# Patient Record
Sex: Female | Born: 1945 | Race: White | Hispanic: No | Marital: Single | Smoking: Former smoker
Health system: Southern US, Community
[De-identification: ages and names within clinical notes are randomized; demographics above are authoritative.]

## PROBLEM LIST (undated history)

## (undated) DIAGNOSIS — E119 Type 2 diabetes mellitus without complications: Secondary | ICD-10-CM

## (undated) DIAGNOSIS — R32 Unspecified urinary incontinence: Secondary | ICD-10-CM

## (undated) DIAGNOSIS — G2401 Drug induced subacute dyskinesia: Secondary | ICD-10-CM

## (undated) DIAGNOSIS — F1911 Other psychoactive substance abuse, in remission: Secondary | ICD-10-CM

## (undated) DIAGNOSIS — F32A Depression, unspecified: Secondary | ICD-10-CM

## (undated) DIAGNOSIS — R7303 Prediabetes: Secondary | ICD-10-CM

## (undated) DIAGNOSIS — I340 Nonrheumatic mitral (valve) insufficiency: Secondary | ICD-10-CM

## (undated) DIAGNOSIS — T7840XA Allergy, unspecified, initial encounter: Secondary | ICD-10-CM

## (undated) DIAGNOSIS — Z8619 Personal history of other infectious and parasitic diseases: Secondary | ICD-10-CM

## (undated) DIAGNOSIS — F1011 Alcohol abuse, in remission: Secondary | ICD-10-CM

## (undated) DIAGNOSIS — N189 Chronic kidney disease, unspecified: Secondary | ICD-10-CM

## (undated) DIAGNOSIS — J45909 Unspecified asthma, uncomplicated: Secondary | ICD-10-CM

## (undated) DIAGNOSIS — R011 Cardiac murmur, unspecified: Secondary | ICD-10-CM

## (undated) DIAGNOSIS — M1990 Unspecified osteoarthritis, unspecified site: Secondary | ICD-10-CM

## (undated) DIAGNOSIS — K635 Polyp of colon: Secondary | ICD-10-CM

## (undated) DIAGNOSIS — Z9989 Dependence on other enabling machines and devices: Secondary | ICD-10-CM

## (undated) DIAGNOSIS — I10 Essential (primary) hypertension: Secondary | ICD-10-CM

## (undated) DIAGNOSIS — J449 Chronic obstructive pulmonary disease, unspecified: Secondary | ICD-10-CM

## (undated) DIAGNOSIS — Z923 Personal history of irradiation: Secondary | ICD-10-CM

## (undated) DIAGNOSIS — E785 Hyperlipidemia, unspecified: Secondary | ICD-10-CM

## (undated) DIAGNOSIS — F319 Bipolar disorder, unspecified: Secondary | ICD-10-CM

## (undated) DIAGNOSIS — J189 Pneumonia, unspecified organism: Secondary | ICD-10-CM

## (undated) DIAGNOSIS — F329 Major depressive disorder, single episode, unspecified: Secondary | ICD-10-CM

## (undated) DIAGNOSIS — K219 Gastro-esophageal reflux disease without esophagitis: Secondary | ICD-10-CM

## (undated) DIAGNOSIS — Z972 Presence of dental prosthetic device (complete) (partial): Secondary | ICD-10-CM

## (undated) HISTORY — DX: Nonrheumatic mitral (valve) insufficiency: I34.0

## (undated) HISTORY — DX: Personal history of other infectious and parasitic diseases: Z86.19

## (undated) HISTORY — PX: SUBTOTAL COLECTOMY: SHX855

## (undated) HISTORY — DX: Unspecified osteoarthritis, unspecified site: M19.90

## (undated) HISTORY — DX: Other psychoactive substance abuse, in remission: F19.11

## (undated) HISTORY — DX: Unspecified urinary incontinence: R32

## (undated) HISTORY — DX: Alcohol abuse, in remission: F10.11

## (undated) HISTORY — DX: Gastro-esophageal reflux disease without esophagitis: K21.9

## (undated) HISTORY — DX: Unspecified asthma, uncomplicated: J45.909

## (undated) HISTORY — DX: Polyp of colon: K63.5

## (undated) HISTORY — PX: APPENDECTOMY: SHX54

## (undated) HISTORY — DX: Depression, unspecified: F32.A

## (undated) HISTORY — PX: BREAST BIOPSY: SHX20

## (undated) HISTORY — PX: CHOLECYSTECTOMY: SHX55

## (undated) HISTORY — DX: Bipolar disorder, unspecified: F31.9

## (undated) HISTORY — DX: Type 2 diabetes mellitus without complications: E11.9

## (undated) HISTORY — DX: Chronic kidney disease, unspecified: N18.9

## (undated) HISTORY — DX: Chronic obstructive pulmonary disease, unspecified: J44.9

## (undated) HISTORY — DX: Essential (primary) hypertension: I10

## (undated) HISTORY — PX: LUNG BIOPSY: SHX232

## (undated) HISTORY — DX: Allergy, unspecified, initial encounter: T78.40XA

## (undated) HISTORY — DX: Hyperlipidemia, unspecified: E78.5

## (undated) HISTORY — DX: Major depressive disorder, single episode, unspecified: F32.9

---

## 2003-06-06 HISTORY — PX: SUBTOTAL COLECTOMY: SHX855

## 2015-06-22 DIAGNOSIS — R7301 Impaired fasting glucose: Secondary | ICD-10-CM | POA: Diagnosis not present

## 2015-07-19 DIAGNOSIS — Z1231 Encounter for screening mammogram for malignant neoplasm of breast: Secondary | ICD-10-CM | POA: Diagnosis not present

## 2015-07-20 DIAGNOSIS — E119 Type 2 diabetes mellitus without complications: Secondary | ICD-10-CM | POA: Diagnosis not present

## 2015-07-27 DIAGNOSIS — H1859 Other hereditary corneal dystrophies: Secondary | ICD-10-CM | POA: Diagnosis not present

## 2015-07-27 DIAGNOSIS — H02834 Dermatochalasis of left upper eyelid: Secondary | ICD-10-CM | POA: Diagnosis not present

## 2015-07-27 DIAGNOSIS — H25811 Combined forms of age-related cataract, right eye: Secondary | ICD-10-CM | POA: Diagnosis not present

## 2015-07-27 DIAGNOSIS — H04123 Dry eye syndrome of bilateral lacrimal glands: Secondary | ICD-10-CM | POA: Diagnosis not present

## 2015-08-16 DIAGNOSIS — E7800 Pure hypercholesterolemia, unspecified: Secondary | ICD-10-CM | POA: Diagnosis not present

## 2015-08-16 DIAGNOSIS — F3162 Bipolar disorder, current episode mixed, moderate: Secondary | ICD-10-CM | POA: Diagnosis not present

## 2015-08-16 DIAGNOSIS — E119 Type 2 diabetes mellitus without complications: Secondary | ICD-10-CM | POA: Diagnosis not present

## 2015-08-17 DIAGNOSIS — E7800 Pure hypercholesterolemia, unspecified: Secondary | ICD-10-CM | POA: Diagnosis not present

## 2015-08-17 DIAGNOSIS — N3941 Urge incontinence: Secondary | ICD-10-CM | POA: Diagnosis not present

## 2015-08-17 DIAGNOSIS — I10 Essential (primary) hypertension: Secondary | ICD-10-CM | POA: Diagnosis not present

## 2015-08-17 DIAGNOSIS — K219 Gastro-esophageal reflux disease without esophagitis: Secondary | ICD-10-CM | POA: Diagnosis not present

## 2015-08-17 DIAGNOSIS — R7309 Other abnormal glucose: Secondary | ICD-10-CM | POA: Diagnosis not present

## 2015-08-17 DIAGNOSIS — J449 Chronic obstructive pulmonary disease, unspecified: Secondary | ICD-10-CM | POA: Diagnosis not present

## 2015-08-17 DIAGNOSIS — R7989 Other specified abnormal findings of blood chemistry: Secondary | ICD-10-CM | POA: Diagnosis not present

## 2015-08-17 DIAGNOSIS — F319 Bipolar disorder, unspecified: Secondary | ICD-10-CM | POA: Diagnosis not present

## 2015-08-18 DIAGNOSIS — E213 Hyperparathyroidism, unspecified: Secondary | ICD-10-CM | POA: Diagnosis not present

## 2015-08-18 DIAGNOSIS — E042 Nontoxic multinodular goiter: Secondary | ICD-10-CM | POA: Diagnosis not present

## 2015-08-18 DIAGNOSIS — E559 Vitamin D deficiency, unspecified: Secondary | ICD-10-CM | POA: Diagnosis not present

## 2015-08-26 DIAGNOSIS — H25811 Combined forms of age-related cataract, right eye: Secondary | ICD-10-CM | POA: Diagnosis not present

## 2015-08-30 DIAGNOSIS — R7989 Other specified abnormal findings of blood chemistry: Secondary | ICD-10-CM | POA: Diagnosis not present

## 2015-08-31 DIAGNOSIS — R7989 Other specified abnormal findings of blood chemistry: Secondary | ICD-10-CM | POA: Diagnosis not present

## 2015-08-31 DIAGNOSIS — N3941 Urge incontinence: Secondary | ICD-10-CM | POA: Diagnosis not present

## 2015-08-31 DIAGNOSIS — R7309 Other abnormal glucose: Secondary | ICD-10-CM | POA: Diagnosis not present

## 2015-08-31 DIAGNOSIS — F17200 Nicotine dependence, unspecified, uncomplicated: Secondary | ICD-10-CM | POA: Diagnosis not present

## 2015-08-31 DIAGNOSIS — K219 Gastro-esophageal reflux disease without esophagitis: Secondary | ICD-10-CM | POA: Diagnosis not present

## 2015-09-01 DIAGNOSIS — M25569 Pain in unspecified knee: Secondary | ICD-10-CM | POA: Diagnosis not present

## 2015-09-01 DIAGNOSIS — R7309 Other abnormal glucose: Secondary | ICD-10-CM | POA: Diagnosis not present

## 2015-09-01 DIAGNOSIS — R7303 Prediabetes: Secondary | ICD-10-CM | POA: Diagnosis not present

## 2015-09-30 DIAGNOSIS — E119 Type 2 diabetes mellitus without complications: Secondary | ICD-10-CM | POA: Diagnosis not present

## 2015-11-09 DIAGNOSIS — F314 Bipolar disorder, current episode depressed, severe, without psychotic features: Secondary | ICD-10-CM | POA: Diagnosis not present

## 2015-11-11 DIAGNOSIS — R7302 Impaired glucose tolerance (oral): Secondary | ICD-10-CM | POA: Diagnosis not present

## 2015-11-22 DIAGNOSIS — E7800 Pure hypercholesterolemia, unspecified: Secondary | ICD-10-CM | POA: Diagnosis not present

## 2015-11-22 DIAGNOSIS — E119 Type 2 diabetes mellitus without complications: Secondary | ICD-10-CM | POA: Diagnosis not present

## 2015-11-22 DIAGNOSIS — I10 Essential (primary) hypertension: Secondary | ICD-10-CM | POA: Diagnosis not present

## 2015-11-23 DIAGNOSIS — E7800 Pure hypercholesterolemia, unspecified: Secondary | ICD-10-CM | POA: Diagnosis not present

## 2015-11-23 DIAGNOSIS — I10 Essential (primary) hypertension: Secondary | ICD-10-CM | POA: Diagnosis not present

## 2015-11-23 DIAGNOSIS — R7309 Other abnormal glucose: Secondary | ICD-10-CM | POA: Diagnosis not present

## 2015-12-27 DIAGNOSIS — E119 Type 2 diabetes mellitus without complications: Secondary | ICD-10-CM | POA: Diagnosis not present

## 2016-02-01 DIAGNOSIS — F3162 Bipolar disorder, current episode mixed, moderate: Secondary | ICD-10-CM | POA: Diagnosis not present

## 2016-02-15 DIAGNOSIS — I10 Essential (primary) hypertension: Secondary | ICD-10-CM | POA: Diagnosis not present

## 2016-02-15 DIAGNOSIS — R21 Rash and other nonspecific skin eruption: Secondary | ICD-10-CM | POA: Diagnosis not present

## 2016-02-15 DIAGNOSIS — E7800 Pure hypercholesterolemia, unspecified: Secondary | ICD-10-CM | POA: Diagnosis not present

## 2016-02-15 DIAGNOSIS — E119 Type 2 diabetes mellitus without complications: Secondary | ICD-10-CM | POA: Diagnosis not present

## 2016-02-15 DIAGNOSIS — R7309 Other abnormal glucose: Secondary | ICD-10-CM | POA: Diagnosis not present

## 2016-02-15 DIAGNOSIS — N3941 Urge incontinence: Secondary | ICD-10-CM | POA: Diagnosis not present

## 2016-02-21 DIAGNOSIS — R7309 Other abnormal glucose: Secondary | ICD-10-CM | POA: Diagnosis not present

## 2016-02-21 DIAGNOSIS — E875 Hyperkalemia: Secondary | ICD-10-CM | POA: Diagnosis not present

## 2016-02-21 DIAGNOSIS — E782 Mixed hyperlipidemia: Secondary | ICD-10-CM | POA: Diagnosis not present

## 2016-02-21 DIAGNOSIS — E119 Type 2 diabetes mellitus without complications: Secondary | ICD-10-CM | POA: Diagnosis not present

## 2016-03-27 DIAGNOSIS — E119 Type 2 diabetes mellitus without complications: Secondary | ICD-10-CM | POA: Diagnosis not present

## 2016-04-04 DIAGNOSIS — K219 Gastro-esophageal reflux disease without esophagitis: Secondary | ICD-10-CM | POA: Diagnosis not present

## 2016-04-04 DIAGNOSIS — Z6832 Body mass index (BMI) 32.0-32.9, adult: Secondary | ICD-10-CM | POA: Diagnosis not present

## 2016-04-04 DIAGNOSIS — J84117 Desquamative interstitial pneumonia: Secondary | ICD-10-CM | POA: Diagnosis not present

## 2016-04-04 DIAGNOSIS — J984 Other disorders of lung: Secondary | ICD-10-CM | POA: Diagnosis not present

## 2016-04-04 DIAGNOSIS — F17211 Nicotine dependence, cigarettes, in remission: Secondary | ICD-10-CM | POA: Diagnosis not present

## 2016-04-04 DIAGNOSIS — E669 Obesity, unspecified: Secondary | ICD-10-CM | POA: Diagnosis not present

## 2016-04-04 DIAGNOSIS — Z23 Encounter for immunization: Secondary | ICD-10-CM | POA: Diagnosis not present

## 2016-04-04 DIAGNOSIS — J449 Chronic obstructive pulmonary disease, unspecified: Secondary | ICD-10-CM | POA: Diagnosis not present

## 2016-04-04 DIAGNOSIS — R918 Other nonspecific abnormal finding of lung field: Secondary | ICD-10-CM | POA: Diagnosis not present

## 2016-05-09 DIAGNOSIS — F3162 Bipolar disorder, current episode mixed, moderate: Secondary | ICD-10-CM | POA: Diagnosis not present

## 2016-05-10 DIAGNOSIS — E119 Type 2 diabetes mellitus without complications: Secondary | ICD-10-CM | POA: Diagnosis not present

## 2016-05-22 DIAGNOSIS — E119 Type 2 diabetes mellitus without complications: Secondary | ICD-10-CM | POA: Diagnosis not present

## 2016-05-22 DIAGNOSIS — E213 Hyperparathyroidism, unspecified: Secondary | ICD-10-CM | POA: Diagnosis not present

## 2016-05-22 DIAGNOSIS — E7800 Pure hypercholesterolemia, unspecified: Secondary | ICD-10-CM | POA: Diagnosis not present

## 2016-05-22 DIAGNOSIS — E559 Vitamin D deficiency, unspecified: Secondary | ICD-10-CM | POA: Diagnosis not present

## 2016-05-22 DIAGNOSIS — E042 Nontoxic multinodular goiter: Secondary | ICD-10-CM | POA: Diagnosis not present

## 2016-05-23 DIAGNOSIS — E7800 Pure hypercholesterolemia, unspecified: Secondary | ICD-10-CM | POA: Diagnosis not present

## 2016-05-23 DIAGNOSIS — I10 Essential (primary) hypertension: Secondary | ICD-10-CM | POA: Diagnosis not present

## 2016-05-23 DIAGNOSIS — E119 Type 2 diabetes mellitus without complications: Secondary | ICD-10-CM | POA: Diagnosis not present

## 2016-06-20 DIAGNOSIS — M79644 Pain in right finger(s): Secondary | ICD-10-CM | POA: Diagnosis not present

## 2016-07-12 DIAGNOSIS — E119 Type 2 diabetes mellitus without complications: Secondary | ICD-10-CM | POA: Diagnosis not present

## 2016-08-01 DIAGNOSIS — F3162 Bipolar disorder, current episode mixed, moderate: Secondary | ICD-10-CM | POA: Diagnosis not present

## 2016-11-09 ENCOUNTER — Ambulatory Visit (INDEPENDENT_AMBULATORY_CARE_PROVIDER_SITE_OTHER): Payer: Medicare Other | Admitting: Family

## 2016-11-09 ENCOUNTER — Encounter: Payer: Self-pay | Admitting: Family

## 2016-11-09 VITALS — BP 118/64 | HR 86 | Temp 98.4°F | Resp 14 | Ht 64.0 in | Wt 188.0 lb

## 2016-11-09 DIAGNOSIS — F3181 Bipolar II disorder: Secondary | ICD-10-CM | POA: Diagnosis not present

## 2016-11-09 DIAGNOSIS — I10 Essential (primary) hypertension: Secondary | ICD-10-CM | POA: Diagnosis not present

## 2016-11-09 DIAGNOSIS — E119 Type 2 diabetes mellitus without complications: Secondary | ICD-10-CM | POA: Diagnosis not present

## 2016-11-09 DIAGNOSIS — N3281 Overactive bladder: Secondary | ICD-10-CM | POA: Insufficient documentation

## 2016-11-09 DIAGNOSIS — E782 Mixed hyperlipidemia: Secondary | ICD-10-CM

## 2016-11-09 DIAGNOSIS — J449 Chronic obstructive pulmonary disease, unspecified: Secondary | ICD-10-CM

## 2016-11-09 MED ORDER — PAROXETINE HCL 40 MG PO TABS: 60.0000 mg | ORAL_TABLET | ORAL | 0 refills | Status: DC

## 2016-11-09 MED ORDER — EZETIMIBE 10 MG PO TABS: 10.0000 mg | ORAL_TABLET | Freq: Every day | ORAL | 0 refills | Status: DC

## 2016-11-09 MED ORDER — PRAVASTATIN SODIUM 10 MG PO TABS: 10.0000 mg | ORAL_TABLET | ORAL | 0 refills | Status: DC

## 2016-11-09 MED ORDER — UMECLIDINIUM BROMIDE 62.5 MCG/INH IN AEPB: 1.0000 | INHALATION_SPRAY | Freq: Every day | RESPIRATORY_TRACT | 0 refills | Status: DC

## 2016-11-09 MED ORDER — TOLTERODINE TARTRATE ER 4 MG PO CP24: 4.0000 mg | ORAL_CAPSULE | Freq: Every day | ORAL | 0 refills | Status: DC

## 2016-11-09 MED ORDER — METFORMIN HCL ER 500 MG PO TB24: 500.0000 mg | ORAL_TABLET | Freq: Every day | ORAL | 0 refills | Status: DC

## 2016-11-09 NOTE — Assessment & Plan Note (Signed)
Blood pressure appears adequately controlled current medication regimen and no adverse side effects. Continue current dosage of amlodipine. Encouraged to monitor blood pressure at home and follow sodium diet. Denies worst headache of life with no new symptoms of end organ damage noted in physical exam. Continue to monitor.

## 2016-11-09 NOTE — Assessment & Plan Note (Signed)
Bipolar and depression appear adequately controlled with current medication regimen and no adverse side effects or suicidal ideations. Continue current dosage of olanzapine, Ambien, and paroxetine. Follow-up and changes per psychiatry.

## 2016-11-09 NOTE — Assessment & Plan Note (Signed)
Type 2 diabetes currently maintained on metformin appears adequately controlled with most recent A1c of 6.4. Diabetic foot exam completed today. Patient indicates she is completed Pneumovax. We'll await documentation. Maintained on pravastatin for CAD risk reduction. Continue current dosage of metformin and monitor blood sugars at home 1-2 times per week.

## 2016-11-09 NOTE — Progress Notes (Signed)
Subjective:    Patient ID: Paula [name], female    DOB: Apr 18, 1946, 71 y.o.   MRN: [id number]  Chief Complaint  Patient presents with  . Establish Care    medication refills    HPI:  Paula [name] is a 71 y.o. female who  has a past medical history of Allergy; Arthritis; Asthma; Bipolar 1 disorder ([place]); Colon polyps; Depression; Diabetes mellitus without complication ([place]); GERD (gastroesophageal reflux disease); History of alcohol abuse; History of chicken pox; History of drug abuse; Hyperlipidemia; Hypertension; and Urine incontinence. and presents today for an office visit to establish care.   1.) Hypertension - currently maintained on amlodipine. Reports taking the medications as prescribed and denies adverse side effects or hypotensive readings. Blood pressures at home . Denies changes in vision, worst headache of life or new symptoms of end organ damage. Working on following a low sodium diet.   BP Readings from Last 3 Encounters:  11/09/16 118/64    2.) Diabetes - Currently maintained on metformin. Reports taking the medication as prescribed and denies adverse side effects or hypoglycemic readings. Blood sugars at home have been well controlled. Denies new symptoms of end organ damage. No excessive hunger, thirst, or urination. Working on a low/carbohydrate modified oral intake. Most recent A1c from outside blood work was 6.4.   No results found for: CREATININE, BUN, NA, K, CL, CO2   3.) Bipolar / Depression  -Currently maintained on olanzapine, paroxetine, and Ambien. Reports taking the medications as prescribed and denies adverse side effects or suicidal ideations. Describes her mood to be stable currently. She is scheduled to see psychiatry to assume care for her depression/bipolar symptoms.  4.) Urinary incontinence - currently maintained on Detrol. Reports taking the medication as prescribed and denies adverse side effects. Symptoms are adequately controlled with some  relief for improvement and she still does experience urinary frequency. Denies additional urinary symptoms including dysuria, hematuria, or flank pain.  5.) Hyperlipidemia - previously diagnosed with hyperlipidemia and currently maintained on ezetimibe and pravastatin. She takes the pravastatin 3 days per week secondary to muscle pains she takes a greater than that. Denies chest pain, shortness of breath, or heart palpitations.  6.) COPD - l previously diagnosed with COPD and currently maintained on Incuse. She has a 82-1/2 pack year history. She is no longer currently smoking. Denies shortness of breath or wheezing. Symptoms are adequately controlled current medication regimen.     Allergies  Allergen Reactions  . Penicillins       No outpatient prescriptions prior to visit.   No facility-administered medications prior to visit.      Past Medical History:  Diagnosis Date  . Allergy   . Arthritis   . Asthma   . Bipolar 1 disorder ([place])   . Colon polyps   . Depression   . Diabetes mellitus without complication ([place])   . GERD (gastroesophageal reflux disease)   . History of alcohol abuse   . History of chicken pox   . History of drug abuse   . Hyperlipidemia   . Hypertension   . Urine incontinence       Past Surgical History:  Procedure Laterality Date  . APPENDECTOMY    . BREAST BIOPSY    . CHOLECYSTECTOMY    . LUNG BIOPSY    . SUBTOTAL COLECTOMY        Family History  Problem Relation Age of Onset  . Hyperlipidemia Mother   . Arthritis Father   . Heart disease  Father   . Diabetes Father   . Heart disease Paternal Uncle   . Heart disease Paternal Grandmother       Social History   Social History  . Marital status: Single    Spouse name: N/A  . Number of children: 1  . Years of education: 38   Occupational History  . Retired Therapist, sports    Social History Main Topics  . Smoking status: Former Smoker    Packs/day: 1.50    Years: 55.00    Types:  Cigarettes  . Smokeless tobacco: Never Used  . Alcohol use 2.4 - 3.6 oz/week    4 - 6 Cans of beer per week  . Drug use: Yes    Frequency: 7.0 times per week    Types: Marijuana  . Sexual activity: Not on file   Other Topics Concern  . Not on file   Social History Narrative   Fun/Hobby: Read   Denies abuse and feels safe at home       Review of Systems  Constitutional: Negative for chills, fatigue and fever.  Eyes:       Negative for changes in vision  Respiratory: Negative for cough, chest tightness, shortness of breath and wheezing.   Cardiovascular: Negative for chest pain, palpitations and leg swelling.  Endocrine: Negative for polydipsia, polyphagia and polyuria.  Genitourinary: Positive for frequency. Negative for dysuria, hematuria and urgency.  Neurological: Negative for dizziness, weakness, light-headedness and numbness.       Objective:    BP 118/64 (BP Location: Left Arm, Patient Position: Sitting, Cuff Size: Large)   Pulse 86   Temp 98.4 F (36.9 C) (Oral)   Resp 14   Ht 5\' 4"  (1.626 m)   Wt 188 lb (85.3 kg)   SpO2 92%   BMI 32.27 kg/m  Nursing note and vital signs reviewed.  Physical Exam  Constitutional: She is oriented to person, place, and time. She appears well-developed and well-nourished. No distress.  Cardiovascular: Normal rate, regular rhythm and intact distal pulses.  Exam reveals no gallop and no friction rub.   No murmur heard. Pulmonary/Chest: Effort normal and breath sounds normal. No respiratory distress. She has no wheezes. She has no rales. She exhibits no tenderness.  Lymphadenopathy:    She has no cervical adenopathy.  Neurological: She is alert and oriented to person, place, and time.  Diabetic foot exam - bilateral feet are free from skin breakdown, cuts, and abrasions. Toenails are neatly trimmed. Pulses are intact and appropriate. Sensation is intact to monofilament bilaterally.  Skin: Skin is warm and dry.  Psychiatric: She  has a normal mood and affect. Her behavior is normal. Judgment and thought content normal.        Assessment & Plan:   Problem List Items Addressed This Visit      Cardiovascular and Mediastinum   Essential hypertension    Blood pressure appears adequately controlled current medication regimen and no adverse side effects. Continue current dosage of amlodipine. Encouraged to monitor blood pressure at home and follow sodium diet. Denies worst headache of life with no new symptoms of end organ damage noted in physical exam. Continue to monitor.      Relevant Medications   amLODipine (NORVASC) 2.5 MG tablet   aspirin EC 81 MG tablet   ezetimibe (ZETIA) 10 MG tablet   pravastatin (PRAVACHOL) 10 MG tablet (Start on 11/10/2016)     Respiratory   COPD (chronic obstructive pulmonary disease) ([place])    Appears  he diagnosed with COPD and maintained on Incuse. No current symptoms of exacerbation. Consider pulmonary function tests in the near future. Continue current dosage of Incruse.      Relevant Medications   umeclidinium bromide (INCRUSE ELLIPTA) 62.5 MCG/INH AEPB     Endocrine   Type 2 diabetes mellitus, controlled ([place]) - Primary    Type 2 diabetes currently maintained on metformin appears adequately controlled with most recent A1c of 6.4. Diabetic foot exam completed today. Patient indicates she is completed Pneumovax. We'll await documentation. Maintained on pravastatin for CAD risk reduction. Continue current dosage of metformin and monitor blood sugars at home 1-2 times per week.      Relevant Medications   aspirin EC 81 MG tablet   metFORMIN (GLUCOPHAGE-XR) 500 MG 24 hr tablet   pravastatin (PRAVACHOL) 10 MG tablet (Start on 11/10/2016)     Genitourinary   Overactive bladder    Continues to experience symptoms of overactive bladder with increased urinary frequency. Increase Detrol LA to 4 mg daily. Encouraged to avoid caffeine. Follow-up in one month or sooner if needed.         Other   Mixed hyperlipidemia    Mixed hyperlipidemia appears poorly controlled with current medication regimen based on previous lab results. Recheck lipid profile in one month. Continue current dosage of pravastatin and Zetia. Encouraged to follow low fat diet to reduce saturated fats and processed/sugary food intake.      Relevant Medications   amLODipine (NORVASC) 2.5 MG tablet   aspirin EC 81 MG tablet   ezetimibe (ZETIA) 10 MG tablet   pravastatin (PRAVACHOL) 10 MG tablet (Start on 11/10/2016)   Bipolar 2 disorder (HCC)    Bipolar and depression appear adequately controlled with current medication regimen and no adverse side effects or suicidal ideations. Continue current dosage of olanzapine, Ambien, and paroxetine. Follow-up and changes per psychiatry.          I have discontinued Ms. Kuenzel's paroxetine, metFORMIN, and tolterodine. I have also changed her ezetimibe, metFORMIN, PARoxetine, and pravastatin. Additionally, I am having her start on tolterodine. Lastly, I am having her maintain her multivitamin-iron-minerals-folic acid, Vitamin D3, ranitidine, amLODipine, aspirin EC, zolpidem, OLANZapine, and umeclidinium bromide.   Meds ordered this encounter  Medications  . multivitamin-iron-minerals-folic acid (CENTRUM) chewable tablet    Sig: Chew 1 tablet by mouth daily.  . Cholecalciferol (VITAMIN D3) 2000 units TABS    Sig: Take by mouth.  . ranitidine (ZANTAC) 300 MG tablet    Sig: Take 300 mg by mouth at bedtime.  Marland Kitchen DISCONTD: pravastatin (PRAVACHOL) 10 MG tablet    Sig: Take 10 mg by mouth 3 (three) times a week.   Marland Kitchen amLODipine (NORVASC) 2.5 MG tablet    Sig: Take 2.5 mg by mouth daily.  Marland Kitchen aspirin EC 81 MG tablet    Sig: Take 81 mg by mouth daily.  Marland Kitchen zolpidem (AMBIEN) 10 MG tablet    Sig: Take 10 mg by mouth at bedtime as needed for sleep.  Marland Kitchen DISCONTD: paroxetine (PAXIL) 10 MG/5ML suspension    Sig: Take 60 mg by mouth every morning.  Marland Kitchen OLANZapine (ZYPREXA) 15 MG tablet     Sig: Take 15 mg by mouth at bedtime.  Marland Kitchen DISCONTD: metFORMIN (GLUCOPHAGE) 500 MG tablet    Sig: Take by mouth 2 (two) times daily with a meal.  . DISCONTD: ezetimibe (ZETIA) 10 MG tablet    Sig: Take 10 mg by mouth daily.  Marland Kitchen DISCONTD: umeclidinium bromide (INCRUSE ELLIPTA)  62.5 MCG/INH AEPB    Sig: Inhale 1 puff into the lungs daily.  Marland Kitchen DISCONTD: tolterodine (DETROL) 2 MG tablet    Sig: Take 2 mg by mouth 2 (two) times daily.  Marland Kitchen DISCONTD: PARoxetine (PAXIL) 40 MG tablet    Sig: Take 60 mg by mouth every morning.  Marland Kitchen DISCONTD: metFORMIN (GLUCOPHAGE-XR) 500 MG 24 hr tablet    Sig: Take 500 mg by mouth daily with breakfast.  . ezetimibe (ZETIA) 10 MG tablet    Sig: Take 1 tablet (10 mg total) by mouth daily.    Dispense:  90 tablet    Refill:  0    Order Specific Question:   Supervising Provider    Answer:   Pricilla Holm A [[id number]]  . metFORMIN (GLUCOPHAGE-XR) 500 MG 24 hr tablet    Sig: Take 1 tablet (500 mg total) by mouth daily with breakfast.    Dispense:  90 tablet    Refill:  0    Order Specific Question:   Supervising Provider    Answer:   Pricilla Holm A [[id number]]  . PARoxetine (PAXIL) 40 MG tablet    Sig: Take 1.5 tablets (60 mg total) by mouth every morning.    Dispense:  135 tablet    Refill:  0    Order Specific Question:   Supervising Provider    Answer:   Pricilla Holm A [[id number]]  . pravastatin (PRAVACHOL) 10 MG tablet    Sig: Take 1 tablet (10 mg total) by mouth 3 (three) times a week.    Dispense:  36 tablet    Refill:  0    Order Specific Question:   Supervising Provider    Answer:   Pricilla Holm A [[id number]]  . umeclidinium bromide (INCRUSE ELLIPTA) 62.5 MCG/INH AEPB    Sig: Inhale 1 puff into the lungs daily.    Dispense:  90 each    Refill:  0    Order Specific Question:   Supervising Provider    Answer:   Pricilla Holm A [[id number]]  . tolterodine (DETROL LA) 4 MG 24 hr capsule    Sig: Take 1 capsule (4 mg total) by mouth daily.     Dispense:  90 capsule    Refill:  0    Order Specific Question:   Supervising Provider    Answer:   Pricilla Holm A [[id number]]     Follow-up: No Follow-up on file.  Mauricio Po, FNP

## 2016-11-09 NOTE — Assessment & Plan Note (Signed)
Mixed hyperlipidemia appears poorly controlled with current medication regimen based on previous lab results. Recheck lipid profile in one month. Continue current dosage of pravastatin and Zetia. Encouraged to follow low fat diet to reduce saturated fats and processed/sugary food intake.

## 2016-11-09 NOTE — Patient Instructions (Signed)
Thank you for choosing [facility].  SUMMARY AND INSTRUCTIONS:  Please continue to take your medication as prescribed.   Schedule a time for your physical/annual wellness at your convenience.   Plan to follow up in 3 months for blood work or at physical.  Medication:  Your prescription(s) have been submitted to your pharmacy or been printed and provided for you. Please take as directed and contact our office if you believe you are having problem(s) with the medication(s) or have any questions.  Follow up:  If your symptoms worsen or fail to improve, please contact our office for further instruction, or in case of emergency go directly to the emergency room at the closest medical facility.

## 2016-11-09 NOTE — Assessment & Plan Note (Signed)
Continues to experience symptoms of overactive bladder with increased urinary frequency. Increase Detrol LA to 4 mg daily. Encouraged to avoid caffeine. Follow-up in one month or sooner if needed.

## 2016-11-09 NOTE — Assessment & Plan Note (Signed)
Appears he diagnosed with COPD and maintained on Incuse. No current symptoms of exacerbation. Consider pulmonary function tests in the near future. Continue current dosage of Incruse.

## 2016-12-26 DIAGNOSIS — F3131 Bipolar disorder, current episode depressed, mild: Secondary | ICD-10-CM | POA: Diagnosis not present

## 2017-01-26 ENCOUNTER — Other Ambulatory Visit: Payer: Self-pay | Admitting: Family

## 2017-02-06 ENCOUNTER — Other Ambulatory Visit: Payer: Self-pay | Admitting: Family

## 2017-02-06 DIAGNOSIS — Z23 Encounter for immunization: Secondary | ICD-10-CM | POA: Diagnosis not present

## 2017-02-09 ENCOUNTER — Ambulatory Visit: Payer: Medicare Other | Admitting: Family

## 2017-02-09 NOTE — Progress Notes (Signed)
Subjective:   Paula [name] is a 71 y.o. female who presents for an Initial Medicare Annual Wellness Visit.  Review of Systems    No ROS.  Medicare Wellness Visit. Additional risk factors are reflected in the social history.  Cardiac Risk Factors include: advanced age (>65men, >52 women);diabetes mellitus;dyslipidemia;sedentary lifestyle;obesity (BMI >30kg/m2);hypertension Sleep patterns: feels rested on waking, gets up 1-2 times nightly to void and sleeps 5-6 hours nightly.  Patient reports insomnia issues, discussed recommended sleep tips and stress reduction tips, education was attached to patient's AVS.  Home Safety/Smoke Alarms: Feels safe in home. Smoke alarms in place.  Living environment; residence and Firearm Safety: 1-story house/ trailer, no firearms, Lives alone, no needs for DME, good support system. Seat Belt Safety/Bike Helmet: Wears seat belt.     Objective:    Today's Vitals   02/12/17 1022  BP: 120/68  Pulse: 74  Resp: 16  Temp: 98.3 F (36.8 C)  TempSrc: Oral  SpO2: 95%  Weight: 188 lb (85.3 kg)  Height: 5\' 4"  (1.626 m)   Body mass index is 32.27 kg/m.   Current Medications (verified) Outpatient Encounter Prescriptions as of 02/12/2017  Medication Sig  . amLODipine (NORVASC) 2.5 MG tablet Take 2.5 mg by mouth daily.  Marland Kitchen aspirin EC 81 MG tablet Take 81 mg by mouth daily.  . Cholecalciferol (VITAMIN D3) 2000 units TABS Take by mouth.  . ezetimibe (ZETIA) 10 MG tablet TAKE 1 TABLET BY MOUTH EVERY DAY  . metFORMIN (GLUCOPHAGE-XR) 500 MG 24 hr tablet Take 1 tablet (500 mg total) by mouth daily with breakfast.  . multivitamin-iron-minerals-folic acid (CENTRUM) chewable tablet Chew 1 tablet by mouth daily.  Marland Kitchen OLANZapine (ZYPREXA) 15 MG tablet Take 15 mg by mouth at bedtime.  Marland Kitchen PARoxetine (PAXIL) 40 MG tablet TAKE 1.5 TABLETS (60 MG TOTAL) BY MOUTH EVERY MORNING.  . pravastatin (PRAVACHOL) 10 MG tablet Take 1 tablet (10 mg total) by mouth 3 (three) times a  week. Needs office visit for more refills.  . ranitidine (ZANTAC) 300 MG tablet Take 300 mg by mouth at bedtime.  . tolterodine (DETROL LA) 4 MG 24 hr capsule TAKE 1 CAPSULE (4 MG TOTAL) BY MOUTH DAILY.  Marland Kitchen umeclidinium bromide (INCRUSE ELLIPTA) 62.5 MCG/INH AEPB Inhale 1 puff into the lungs daily.  Marland Kitchen zolpidem (AMBIEN) 10 MG tablet Take 10 mg by mouth at bedtime as needed for sleep.   No facility-administered encounter medications on file as of 02/12/2017.     Allergies (verified) Penicillins   History: Past Medical History:  Diagnosis Date  . Allergy   . Arthritis   . Asthma   . Bipolar 1 disorder ([place])   . Colon polyps   . Depression   . Diabetes mellitus without complication ([place])   . GERD (gastroesophageal reflux disease)   . History of alcohol abuse   . History of chicken pox   . History of drug abuse   . Hyperlipidemia   . Hypertension   . Urine incontinence    Past Surgical History:  Procedure Laterality Date  . APPENDECTOMY    . BREAST BIOPSY    . CHOLECYSTECTOMY    . LUNG BIOPSY    . SUBTOTAL COLECTOMY     Family History  Problem Relation Age of Onset  . Hyperlipidemia Mother   . Arthritis Father   . Heart disease Father   . Diabetes Father   . Heart disease Paternal Uncle   . Heart disease Paternal Grandmother    Social  History   Occupational History  . Retired Therapist, sports    Social History Main Topics  . Smoking status: Former Smoker    Packs/day: 1.50    Years: 55.00    Types: Cigarettes  . Smokeless tobacco: Never Used  . Alcohol use 2.4 - 3.6 oz/week    4 - 6 Cans of beer per week  . Drug use: Yes    Frequency: 7.0 times per week    Types: Marijuana  . Sexual activity: Not on file    Tobacco Counseling Counseling given: Not Answered   Activities of Daily Living In your present state of health, do you have any difficulty performing the following activities: 02/12/2017  Hearing? N  Vision? Y  Difficulty concentrating or making decisions? N    Walking or climbing stairs? N  Dressing or bathing? N  Doing errands, shopping? N  Preparing Food and eating ? N  Using the Toilet? N  In the past six months, have you accidently leaked urine? N  Do you have problems with loss of bowel control? N  Managing your Medications? N  Managing your Finances? N  Housekeeping or managing your Housekeeping? N    Immunizations and Health Maintenance Immunization History  Administered Date(s) Administered  . Influenza-Unspecified 02/03/2017  . Pneumococcal Conjugate-13 02/12/2017   Health Maintenance Due  Topic Date Due  . HEMOGLOBIN A1C  03/10/1946  . Hepatitis C Screening  09-01-1945  . OPHTHALMOLOGY EXAM  05/05/1955  . URINE MICROALBUMIN  05/05/1955  . TETANUS/TDAP  05/04/1964  . MAMMOGRAM  05/05/1995  . COLONOSCOPY  05/05/1995  . DEXA SCAN  05/04/2010  . PNA vac Low Risk Adult (1 of 2 - PCV13) 05/04/2010    Patient Care Team: [place], FNP as PCP - General (Family Medicine)  Indicate any recent Medical Services you may have received from other than [facility] providers in the past year (date may be approximate).     Assessment:   This is a routine wellness examination for [place]. Physical assessment deferred to PCP.   Hearing/Vision screen  Visual Acuity Screening   Right eye Left eye Both eyes  Without correction: 20/63 2063 20/63  With correction:     Comments: Eye referral placed today.  Hearing Screening Comments: Able to hear conversational tones w/o difficulty. No issues reported.  Passed whisper test  Dietary issues and exercise activities discussed: Current Exercise Habits: The patient does not participate in regular exercise at present, Exercise limited by: None identified  Reviewed heart healthy and diabetic diet, encouraged patient to increase daily water intake.   Goals    . Go back to  the gym          I will visit the gym close to my house and go walk in the park.      Depression Screen PHQ  2/9 Scores 02/12/2017  PHQ - 2 Score 3  PHQ- 9 Score 5    Fall Risk Fall Risk  02/12/2017  Falls in the past year? Yes  Number falls in past yr: 2 or more  Follow up Falls prevention discussed;Education provided    Cognitive Function: MMSE - Mini Mental State Exam 02/12/2017  Orientation to time 5  Orientation to Place 5  Registration 3  Attention/ Calculation 5  Recall 2  Language- name 2 objects 2  Language- repeat 1  Language- follow 3 step command 3  Language- read & follow direction 1  Write a sentence 1  Copy design 1  Total  score 29        Screening Tests Health Maintenance  Topic Date Due  . HEMOGLOBIN A1C  15-Jan-1946  . Hepatitis C Screening  1946-05-12  . OPHTHALMOLOGY EXAM  05/05/1955  . URINE MICROALBUMIN  05/05/1955  . TETANUS/TDAP  05/04/1964  . MAMMOGRAM  05/05/1995  . COLONOSCOPY  05/05/1995  . DEXA SCAN  05/04/2010  . PNA vac Low Risk Adult (1 of 2 - PCV13) 05/04/2010  . FOOT EXAM  11/09/2017  . INFLUENZA VACCINE  Completed      Plan:    Continue to eat heart healthy diet (full of fruits, vegetables, whole grains, lean protein, water--limit salt, fat, and sugar intake) and increase physical activity as tolerated.  Continue doing brain stimulating activities (puzzles, reading, adult coloring books, staying active) to keep memory sharp.   I have personally reviewed and noted the following in the patient's chart:   . Medical and social history . Use of alcohol, tobacco or illicit drugs  . Current medications and supplements . Functional ability and status . Nutritional status . Physical activity . Advanced directives . List of other physicians . Vitals . Screenings to include cognitive, depression, and falls . Referrals and appointments  In addition, I have reviewed and discussed with patient certain preventive protocols, quality metrics, and best practice recommendations. A written personalized care plan for preventive services as well as  general preventive health recommendations were provided to patient.     Michiel Cowboy, RN   02/12/2017    Medical screening examination/treatment/procedure(s) were performed by non-physician practitioner and as supervising provider I was immediately available for consultation/collaboration. I agree with treatment plan. Mauricio Po, FNP

## 2017-02-09 NOTE — Progress Notes (Signed)
Pre visit review using our clinic review tool, if applicable. No additional management support is needed unless otherwise documented below in the visit note. 

## 2017-02-12 ENCOUNTER — Other Ambulatory Visit (INDEPENDENT_AMBULATORY_CARE_PROVIDER_SITE_OTHER): Payer: Medicare Other

## 2017-02-12 ENCOUNTER — Ambulatory Visit (INDEPENDENT_AMBULATORY_CARE_PROVIDER_SITE_OTHER): Payer: Medicare Other | Admitting: Family

## 2017-02-12 ENCOUNTER — Encounter: Payer: Self-pay | Admitting: Family

## 2017-02-12 VITALS — BP 120/68 | HR 74 | Temp 98.3°F | Resp 16 | Ht 64.0 in | Wt 188.0 lb

## 2017-02-12 DIAGNOSIS — E782 Mixed hyperlipidemia: Secondary | ICD-10-CM

## 2017-02-12 DIAGNOSIS — Z0000 Encounter for general adult medical examination without abnormal findings: Secondary | ICD-10-CM | POA: Diagnosis not present

## 2017-02-12 DIAGNOSIS — Z23 Encounter for immunization: Secondary | ICD-10-CM

## 2017-02-12 DIAGNOSIS — E119 Type 2 diabetes mellitus without complications: Secondary | ICD-10-CM

## 2017-02-12 DIAGNOSIS — E2839 Other primary ovarian failure: Secondary | ICD-10-CM

## 2017-02-12 DIAGNOSIS — Z1231 Encounter for screening mammogram for malignant neoplasm of breast: Secondary | ICD-10-CM | POA: Diagnosis not present

## 2017-02-12 LAB — LIPID PANEL
CHOL/HDL RATIO: 4
CHOLESTEROL: 218 mg/dL — AB (ref 0–200)
HDL: 50.8 mg/dL (ref >39.00)
NonHDL: 167.65
TRIGLYCERIDES: 261 mg/dL — AB (ref 0.0–149.0)
VLDL: 52.2 mg/dL — ABNORMAL HIGH (ref 0.0–40.0)

## 2017-02-12 LAB — COMPREHENSIVE METABOLIC PANEL
ALK PHOS: 90 U/L (ref 39–117)
ALT: 14 U/L (ref 0–35)
AST: 15 U/L (ref 0–37)
Albumin: 4.2 g/dL (ref 3.5–5.2)
BILIRUBIN TOTAL: 0.4 mg/dL (ref 0.2–1.2)
BUN: 16 mg/dL (ref 6–23)
CALCIUM: 10.2 mg/dL (ref 8.4–10.5)
CO2: 28 mEq/L (ref 19–32)
Chloride: 102 mEq/L (ref 96–112)
Creatinine, Ser: 1.15 mg/dL (ref 0.40–1.20)
GFR: 49.33 mL/min — AB (ref >60.00)
Glucose, Bld: 110 mg/dL — ABNORMAL HIGH (ref 70–99)
POTASSIUM: 4.5 meq/L (ref 3.5–5.1)
Sodium: 141 mEq/L (ref 135–145)
TOTAL PROTEIN: 7.4 g/dL (ref 6.0–8.3)

## 2017-02-12 LAB — CBC
HCT: 41.3 % (ref 36.0–46.0)
Hemoglobin: 13.7 g/dL (ref 12.0–15.0)
MCHC: 33.2 g/dL (ref 30.0–36.0)
MCV: 99.9 fl (ref 78.0–100.0)
PLATELETS: 224 10*3/uL (ref 150.0–400.0)
RBC: 4.13 Mil/uL (ref 3.87–5.11)
RDW: 11.9 % (ref 11.5–15.5)
WBC: 7.9 10*3/uL (ref 4.0–10.5)

## 2017-02-12 LAB — MICROALBUMIN / CREATININE URINE RATIO
CREATININE, U: 44.8 mg/dL
MICROALB/CREAT RATIO: 1.6 mg/g (ref 0.0–30.0)

## 2017-02-12 LAB — HEMOGLOBIN A1C: Hgb A1c MFr Bld: 6.1 % (ref 4.6–6.5)

## 2017-02-12 LAB — LDL CHOLESTEROL, DIRECT: Direct LDL: 115 mg/dL

## 2017-02-12 NOTE — Progress Notes (Signed)
Subjective:    Patient ID: Paula [name], female    DOB: November 13, 1945, 71 y.o.   MRN: [id number]  Chief Complaint  Patient presents with  . Follow-up    diabetes  . Medicare Wellness    HPI:  Paula [name] is a 71 y.o. female who  has a past medical history of Allergy; Arthritis; Asthma; Bipolar 1 disorder ([place]); Colon polyps; Depression; Diabetes mellitus without complication ([place]); GERD (gastroesophageal reflux disease); History of alcohol abuse; History of chicken pox; History of drug abuse; Hyperlipidemia; Hypertension; and Urine incontinence. and presents today for a follow up office visit.   Diabetes - Currently prescribed metformin. Reports taking the medication as prescribed and denies adverse side effects or hypoglycemic readings. Blood sugars at home have been averaging below 130. Denies new symptoms of end organ damage. No excessive hunger, thirst, or urination. Working on a low/carbohydrate modified oral intake  No results found for: HGBA1C   No results found for: CREATININE, BUN, NA, K, CL, CO2      Allergies  Allergen Reactions  . Penicillins       Outpatient Medications Prior to Visit  Medication Sig Dispense Refill  . amLODipine (NORVASC) 2.5 MG tablet Take 2.5 mg by mouth daily.    Marland Kitchen aspirin EC 81 MG tablet Take 81 mg by mouth daily.    . Cholecalciferol (VITAMIN D3) 2000 units TABS Take by mouth.    . ezetimibe (ZETIA) 10 MG tablet TAKE 1 TABLET BY MOUTH EVERY DAY 90 tablet 0  . metFORMIN (GLUCOPHAGE-XR) 500 MG 24 hr tablet Take 1 tablet (500 mg total) by mouth daily with breakfast. 90 tablet 0  . multivitamin-iron-minerals-folic acid (CENTRUM) chewable tablet Chew 1 tablet by mouth daily.    Marland Kitchen OLANZapine (ZYPREXA) 15 MG tablet Take 15 mg by mouth at bedtime.    Marland Kitchen PARoxetine (PAXIL) 40 MG tablet TAKE 1.5 TABLETS (60 MG TOTAL) BY MOUTH EVERY MORNING. 135 tablet 0  . pravastatin (PRAVACHOL) 10 MG tablet Take 1 tablet (10 mg total) by mouth 3 (three) times a  week. Needs office visit for more refills. 36 tablet 0  . ranitidine (ZANTAC) 300 MG tablet Take 300 mg by mouth at bedtime.    . tolterodine (DETROL LA) 4 MG 24 hr capsule TAKE 1 CAPSULE (4 MG TOTAL) BY MOUTH DAILY. 90 capsule 0  . umeclidinium bromide (INCRUSE ELLIPTA) 62.5 MCG/INH AEPB Inhale 1 puff into the lungs daily. 90 each 0  . zolpidem (AMBIEN) 10 MG tablet Take 10 mg by mouth at bedtime as needed for sleep.     No facility-administered medications prior to visit.     Past Medical History:  Diagnosis Date  . Allergy   . Arthritis   . Asthma   . Bipolar 1 disorder ([place])   . Colon polyps   . Depression   . Diabetes mellitus without complication ([place])   . GERD (gastroesophageal reflux disease)   . History of alcohol abuse   . History of chicken pox   . History of drug abuse   . Hyperlipidemia   . Hypertension   . Urine incontinence     Review of Systems  Constitutional: Negative for chills, fatigue and fever.  Eyes:       Denies changes in vision  Respiratory: Negative for chest tightness and shortness of breath.   Cardiovascular: Negative for chest pain, palpitations and leg swelling.  Endocrine: Negative for polydipsia, polyphagia and polyuria.  Neurological: Negative for numbness.  Objective:    BP 120/68 (BP Location: Left Arm, Patient Position: Sitting, Cuff Size: Large)   Pulse 74   Temp 98.3 F (36.8 C) (Oral)   Resp 16   Ht 5' 4" (1.626 m)   Wt 188 lb (85.3 kg)   SpO2 95%   BMI 32.27 kg/m  Nursing note and vital signs reviewed.  Physical Exam  Constitutional: She is oriented to person, place, and time. She appears well-developed and well-nourished. No distress.  Cardiovascular: Normal rate, regular rhythm, normal heart sounds and intact distal pulses.   Pulmonary/Chest: Effort normal and breath sounds normal.  Musculoskeletal:  Diabetic Foot Exam - Simple   Simple Foot Form Diabetic Foot exam was performed with the following findings:  Yes    02/12/2017 10:30 AM  Visual Inspection No deformities, no ulcerations, no other skin breakdown bilaterally:  Yes Sensation Testing Intact to touch and monofilament testing bilaterally:  Yes Pulse Check Posterior Tibialis and Dorsalis pulse intact bilaterally:  Yes Comments    Neurological: She is alert and oriented to person, place, and time.  Skin: Skin is warm and dry.  Psychiatric: She has a normal mood and affect. Her behavior is normal. Judgment and thought content normal.       Assessment & Plan:   Problem List Items Addressed This Visit      Endocrine   Type 2 diabetes mellitus, controlled (HCC)    Obtain A1c, urine microalbumin, and CMET. Diabetic foot exam completed today. Referral placed to opthalmology for diabetic eye exam. Maintained on pravastatin for CAD risk reduction. Pneumovax is up to date per patient. Continue current dosage of metformin pending A1c results.       Relevant Orders   Comp Met (CMET)   Hemoglobin A1c   Urine Microalbumin w/creat. ratio   CBC   Ambulatory referral to Ophthalmology     Other   Mixed hyperlipidemia    Appears stable with current Ezetimbe and pravastatin with no adverse side effects. Obtain lipid profile. Continue current dosage of ezetimibe and pravastatin pending lipid profile results.       Relevant Orders   Comp Met (CMET)   Lipid Profile    Other Visit Diagnoses    Medicare annual wellness visit, subsequent    -  Primary   Estrogen deficiency       Relevant Orders   DEXAScan   Breast cancer screening by mammogram       Relevant Orders   Mammogram Digital Screening   Need for pneumococcal vaccination       Relevant Orders   Pneumococcal conjugate vaccine 13-valent (Completed)       I am having Ms. Nottingham maintain her multivitamin-iron-minerals-folic acid, Vitamin D3, ranitidine, amLODipine, aspirin EC, zolpidem, OLANZapine, metFORMIN, umeclidinium bromide, pravastatin, PARoxetine, ezetimibe, and  tolterodine.   No orders of the defined types were placed in this encounter.    Follow-up: Return in about 3 months (around 05/14/2017), or if symptoms worsen or fail to improve.  Calone, Gregory, FNP   

## 2017-02-12 NOTE — Assessment & Plan Note (Signed)
Obtain A1c, urine microalbumin, and CMET. Diabetic foot exam completed today. Referral placed to opthalmology for diabetic eye exam. Maintained on pravastatin for CAD risk reduction. Pneumovax is up to date per patient. Continue current dosage of metformin pending A1c results.

## 2017-02-12 NOTE — Assessment & Plan Note (Signed)
Appears stable with current Ezetimbe and pravastatin with no adverse side effects. Obtain lipid profile. Continue current dosage of ezetimibe and pravastatin pending lipid profile results.

## 2017-02-12 NOTE — Patient Instructions (Addendum)
Thank you for choosing [facility].  SUMMARY AND INSTRUCTIONS:  Please continue to take your medication as prescribed.   We will check your blood and urine today.   Labs:  Please stop by the lab on the lower level of the building for your blood work. Your results will be released to [place] (or called to you) after review, usually within 72 hours after test completion. If any changes need to be made, you will be notified at that same time.  1.) The lab is open from 7:30am to 5:30 pm Monday-Friday 2.) No appointment is necessary 3.) Fasting (if needed) is 6-8 hours after food and drink; black coffee and water are okay   Follow up:  If your symptoms worsen or fail to improve, please contact our office for further instruction, or in case of emergency go directly to the emergency room at the closest medical facility.   Continue to eat heart healthy diet (full of fruits, vegetables, whole grains, lean protein, water--limit salt, fat, and sugar intake) and increase physical activity as tolerated.  Continue doing brain stimulating activities (puzzles, reading, adult coloring books, staying active) to keep memory sharp.    Paula [name] , Thank you for taking time to come for your Medicare Wellness Visit. I appreciate your ongoing commitment to your health goals. Please review the following plan we discussed and let me know if I can assist you in the future.   These are the goals we discussed: Goals    . Go back to  the gym          I will visit the gym close to my house and go walk in the park.       This is a list of the screening recommended for you and due dates:  Health Maintenance  Topic Date Due  . Hemoglobin A1C  07-10-1945  .  Hepatitis C: One time screening is recommended by Center for Disease Control  (CDC) for  adults born from [birth year] through 1966.   April 29, 1946  . Eye exam for diabetics  05/05/1955  . Urine Protein Check  05/05/1955  . Tetanus Vaccine  05/04/1964  .  Mammogram  05/05/1995  . Colon Cancer Screening  05/05/1995  . DEXA scan (bone density measurement)  05/04/2010  . Pneumonia vaccines (1 of 2 - PCV13) 05/04/2010  . Complete foot exam   11/09/2017  . Flu Shot  Completed    It is important to avoid accidents which may result in broken bones.  Here are a few ideas on how to make your home safer so you will be less likely to trip or fall.  1. Use nonskid mats or non slip strips in your shower or tub, on your bathroom floor and around sinks.  If you know that you have spilled water, wipe it up! 2. In the bathroom, it is important to have properly installed grab bars on the walls or on the edge of the tub.  Towel racks are NOT strong enough for you to hold onto or to pull on for support. 3. Stairs and hallways should have enough light.  Add lamps or night lights if you need ore light. 4. It is good to have handrails on both sides of the stairs if possible.  Always fix broken handrails right away. 5. It is important to see the edges of steps.  Paint the edges of outdoor steps white so you can see them better.  Put colored tape on the edge of  inside steps. 6. Throw-rugs are dangerous because they can slide.  Removing the rugs is the best idea, but if they must stay, add adhesive carpet tape to prevent slipping. 7. Do not keep things on stairs or in the halls.  Remove small furniture that blocks the halls as it may cause you to trip.  Keep telephone and electrical cords out of the way where you walk. 8. Always were sturdy, rubber-soled shoes for good support.  Never wear just socks, especially on the stairs.  Socks may cause you to slip or fall.  Do not wear full-length housecoats as you can easily trip on the bottom.  9. Place the things you use the most on the shelves that are the easiest to reach.  If you use a stepstool, make sure it is in good condition.  If you feel unsteady, DO NOT climb, ask for help. If a health professional advises you to use a  cane or walker, do not be ashamed.  These items can keep you from falling and breaking your bones.  Insomnia Insomnia is a sleep disorder that makes it difficult to fall asleep or to stay asleep. Insomnia can cause tiredness (fatigue), low energy, difficulty concentrating, mood swings, and poor performance at work or school. There are three different ways to classify insomnia:  Difficulty falling asleep.  Difficulty staying asleep.  Waking up too early in the morning.  Any type of insomnia can be long-term (chronic) or short-term (acute). Both are common. Short-term insomnia usually lasts for three months or less. Chronic insomnia occurs at least three times a week for longer than three months. What are the causes? Insomnia may be caused by another condition, situation, or substance, such as:  Anxiety.  Certain medicines.  Gastroesophageal reflux disease (GERD) or other gastrointestinal conditions.  Asthma or other breathing conditions.  Restless legs syndrome, sleep apnea, or other sleep disorders.  Chronic pain.  Menopause. This may include hot flashes.  Stroke.  Abuse of alcohol, tobacco, or illegal drugs.  Depression.  Caffeine.  Neurological disorders, such as Alzheimer disease.  An overactive thyroid (hyperthyroidism).  The cause of insomnia may not be known. What increases the risk? Risk factors for insomnia include:  Gender. Women are more commonly affected than men.  Age. Insomnia is more common as you get older.  Stress. This may involve your professional or personal life.  Income. Insomnia is more common in people with lower income.  Lack of exercise.  Irregular work schedule or night shifts.  Traveling between different time zones.  What are the signs or symptoms? If you have insomnia, trouble falling asleep or trouble staying asleep is the main symptom. This may lead to other symptoms, such as:  Feeling fatigued.  Feeling nervous about  going to sleep.  Not feeling rested in the morning.  Having trouble concentrating.  Feeling irritable, anxious, or depressed.  How is this treated? Treatment for insomnia depends on the cause. If your insomnia is caused by an underlying condition, treatment will focus on addressing the condition. Treatment may also include:  Medicines to help you sleep.  Counseling or therapy.  Lifestyle adjustments.  Follow these instructions at home:  Take medicines only as directed by your health care provider.  Keep regular sleeping and waking hours. Avoid naps.  Keep a sleep diary to help you and your health care provider figure out what could be causing your insomnia. Include: ? When you sleep. ? When you wake up during the night. ?  How well you sleep. ? How rested you feel the next day. ? Any side effects of medicines you are taking. ? What you eat and drink.  Make your bedroom a comfortable place where it is easy to fall asleep: ? Put up shades or special blackout curtains to block light from outside. ? Use a white noise machine to block noise. ? Keep the temperature cool.  Exercise regularly as directed by your health care provider. Avoid exercising right before bedtime.  Use relaxation techniques to manage stress. Ask your health care provider to suggest some techniques that may work well for you. These may include: ? Breathing exercises. ? Routines to release muscle tension. ? Visualizing peaceful scenes.  Cut back on alcohol, caffeinated beverages, and cigarettes, especially close to bedtime. These can disrupt your sleep.  Do not overeat or eat spicy foods right before bedtime. This can lead to digestive discomfort that can make it hard for you to sleep.  Limit screen use before bedtime. This includes: ? Watching TV. ? Using your smartphone, tablet, and computer.  Stick to a routine. This can help you fall asleep faster. Try to do a quiet activity, brush your teeth, and  go to bed at the same time each night.  Get out of bed if you are still awake after 15 minutes of trying to sleep. Keep the lights down, but try reading or doing a quiet activity. When you feel sleepy, go back to bed.  Make sure that you drive carefully. Avoid driving if you feel very sleepy.  Keep all follow-up appointments as directed by your health care provider. This is important. Contact a health care provider if:  You are tired throughout the day or have trouble in your daily routine due to sleepiness.  You continue to have sleep problems or your sleep problems get worse. Get help right away if:  You have serious thoughts about hurting yourself or someone else. This information is not intended to replace advice given to you by your health care provider. Make sure you discuss any questions you have with your health care provider. Document Released: 05/19/2000 Document Revised: 10/22/2015 Document Reviewed: 02/20/2014 Elsevier Interactive Patient Education  [employer].

## 2017-02-13 ENCOUNTER — Encounter: Payer: Self-pay | Admitting: Family

## 2017-02-16 ENCOUNTER — Other Ambulatory Visit: Payer: Self-pay | Admitting: Family

## 2017-02-16 DIAGNOSIS — Z1231 Encounter for screening mammogram for malignant neoplasm of breast: Secondary | ICD-10-CM

## 2017-02-21 ENCOUNTER — Other Ambulatory Visit: Payer: Self-pay | Admitting: Family

## 2017-03-06 DIAGNOSIS — F3131 Bipolar disorder, current episode depressed, mild: Secondary | ICD-10-CM | POA: Diagnosis not present

## 2017-03-09 ENCOUNTER — Other Ambulatory Visit: Payer: Self-pay | Admitting: Family

## 2017-03-09 ENCOUNTER — Other Ambulatory Visit: Payer: Medicare Other

## 2017-03-09 ENCOUNTER — Ambulatory Visit: Payer: Medicare Other

## 2017-03-12 ENCOUNTER — Telehealth: Payer: Self-pay | Admitting: Family

## 2017-03-12 NOTE — Telephone Encounter (Signed)
Pls advise on increase...Paula [name]

## 2017-03-12 NOTE — Telephone Encounter (Signed)
Pt called and states her pravastatin (PRAVACHOL) 10 MG tablet Was supposed to be 20mg . Please advise she would like a new Rx sent in , same pharmacy

## 2017-03-13 MED ORDER — PRAVASTATIN SODIUM 20 MG PO TABS: 20.0000 mg | ORAL_TABLET | Freq: Every day | ORAL | 1 refills | Status: DC

## 2017-03-13 NOTE — Telephone Encounter (Signed)
Medication has been sent to the pharmacy. 

## 2017-03-16 ENCOUNTER — Encounter: Payer: Self-pay | Admitting: Family

## 2017-03-27 ENCOUNTER — Encounter: Payer: Self-pay | Admitting: Family

## 2017-03-30 ENCOUNTER — Ambulatory Visit
Admission: RE | Admit: 2017-03-30 | Discharge: 2017-03-30 | Disposition: A | Discharge status: Home / Self Care | Payer: Medicare Other | Source: Ambulatory Visit | Attending: Family | Admitting: Family

## 2017-03-30 DIAGNOSIS — Z1231 Encounter for screening mammogram for malignant neoplasm of breast: Secondary | ICD-10-CM | POA: Diagnosis not present

## 2017-03-30 DIAGNOSIS — M8589 Other specified disorders of bone density and structure, multiple sites: Secondary | ICD-10-CM | POA: Diagnosis not present

## 2017-03-30 DIAGNOSIS — E2839 Other primary ovarian failure: Secondary | ICD-10-CM

## 2017-03-30 DIAGNOSIS — Z780 Asymptomatic menopausal state: Secondary | ICD-10-CM | POA: Diagnosis not present

## 2017-04-03 ENCOUNTER — Telehealth: Payer: Self-pay | Admitting: Nurse Practitioner

## 2017-04-03 ENCOUNTER — Other Ambulatory Visit: Payer: Self-pay | Admitting: Nurse Practitioner

## 2017-04-03 MED ORDER — AMLODIPINE BESYLATE 5 MG PO TABS
5.0000 mg | ORAL_TABLET | Freq: Every day | ORAL | 3 refills | Status: DC
Start: 2017-04-03 — End: 2017-07-24

## 2017-04-03 MED ORDER — GLUCOSE BLOOD VI STRP: 1.0000 | ORAL_STRIP | Freq: Two times a day (BID) | 0 refills | Status: DC

## 2017-04-03 NOTE — Telephone Encounter (Signed)
Per office policy sent 30 day to local pharmacy until appt.../lmb  

## 2017-04-03 NOTE — Telephone Encounter (Signed)
Patient is requesting refill on free style test strips to be sent to [facility] on [address].

## 2017-04-24 DIAGNOSIS — F3131 Bipolar disorder, current episode depressed, mild: Secondary | ICD-10-CM | POA: Diagnosis not present

## 2017-05-10 ENCOUNTER — Other Ambulatory Visit: Payer: Self-pay | Admitting: Family

## 2017-05-15 ENCOUNTER — Other Ambulatory Visit: Payer: Self-pay | Admitting: Family

## 2017-05-21 ENCOUNTER — Other Ambulatory Visit: Payer: Self-pay | Admitting: Nurse Practitioner

## 2017-05-22 ENCOUNTER — Other Ambulatory Visit: Payer: Self-pay | Admitting: Family

## 2017-05-25 ENCOUNTER — Encounter: Payer: Self-pay | Admitting: Nurse Practitioner

## 2017-05-25 ENCOUNTER — Telehealth: Payer: Self-pay | Admitting: Nurse Practitioner

## 2017-05-25 ENCOUNTER — Ambulatory Visit (INDEPENDENT_AMBULATORY_CARE_PROVIDER_SITE_OTHER): Payer: Medicare Other | Admitting: Nurse Practitioner

## 2017-05-25 VITALS — BP 108/58 | HR 80 | Temp 98.3°F | Resp 16 | Ht 64.0 in | Wt 187.0 lb

## 2017-05-25 DIAGNOSIS — Z23 Encounter for immunization: Secondary | ICD-10-CM | POA: Diagnosis not present

## 2017-05-25 DIAGNOSIS — E119 Type 2 diabetes mellitus without complications: Secondary | ICD-10-CM

## 2017-05-25 DIAGNOSIS — E782 Mixed hyperlipidemia: Secondary | ICD-10-CM

## 2017-05-25 DIAGNOSIS — I10 Essential (primary) hypertension: Secondary | ICD-10-CM

## 2017-05-25 DIAGNOSIS — J449 Chronic obstructive pulmonary disease, unspecified: Secondary | ICD-10-CM

## 2017-05-25 MED ORDER — UMECLIDINIUM BROMIDE 62.5 MCG/INH IN AEPB: 1.0000 | INHALATION_SPRAY | Freq: Every day | RESPIRATORY_TRACT | 0 refills | Status: DC

## 2017-05-25 MED ORDER — EZETIMIBE 10 MG PO TABS: 10.0000 mg | ORAL_TABLET | Freq: Every day | ORAL | 0 refills | Status: DC

## 2017-05-25 MED ORDER — METFORMIN HCL ER 500 MG PO TB24: 500.0000 mg | ORAL_TABLET | Freq: Every day | ORAL | 0 refills | Status: DC

## 2017-05-25 MED ORDER — LISINOPRIL 10 MG PO TABS: 10.0000 mg | ORAL_TABLET | Freq: Every day | ORAL | 1 refills | Status: DC

## 2017-05-25 NOTE — Telephone Encounter (Signed)
Pt aware.

## 2017-05-25 NOTE — Telephone Encounter (Signed)
Please call patient and ask her to hold lisinopril I prescribed today. Her readings here are much lower than her home readings. I would like for her to come in with her home blood pressure cuff for a nurse visit to check her cuff with ours for accuracy. She could do this next week when she comes for lab work. thanks

## 2017-05-25 NOTE — Patient Instructions (Addendum)
Please return when you are fasting (for at least 8 hours) to our lab downstairs for recheck of your cholesterol and kidney function.  Continue your amlodipine 5mg  daily for your blood pressure. START lisinopril 10mg  daily for your blood pressure. This medication will also help protect your kidneys from diabetic complications. Keep up the good work with your blood pressure log, and bring this back for your follow up appointment. Id like for you to come back in about 2 weeks, so we can see how you are doing on the new blood pressure medication.  I have sent refills of your medications as requested.  Please work on your diet and exercise to improve your blood pressure, blood sugar, and cholesterol. Remember half of your plate should be veggies, one-fourth carbs, one-fourth meat, and don't eat meat at every meal. Also, remember to stay away from sugary drinks. I'd like for you to start incorporating exercise into your daily schedule. Start at 10 minutes a day, working up to 30 minutes five times a week.   It was good to meet you. Thanks for letting me take care of you today :)

## 2017-05-25 NOTE — Progress Notes (Signed)
Subjective:    Patient ID: Paula [name], female    DOB: Jul 12, 1945, 71 y.o.   MRN: [id number]  HPI  Ms Scaletta is a 71 yo female who presents today to establish care. She is transferring to me from another provider in the same clinic. She Has the following significant medical problems: hypertension, COPD, type 2 diabetes mellitus, mixed hyperlipidemia, overactive bladder, bipolar 2 disorder. She is maintained on amlipdipine, aspirin, depakote, zetia, metformin, vitmain D3, olanzapine, paxil, pravastatin, zantac, detrol LA, incruse ELLIPTA, and ambien. She follows with pyschiatry for management of her psych meds-dr plovsky. She did follow with a pulmonologist in [place] for chronic lung disease but has not continued this care here in [place].  Hyperlipidemia- Maintained on zetia and  Pravastatin. Her pravastatin dosage was increased to 20 mg daily about 3 months ago She denies adverse effects of medications including muscle aches. She says she has been on other statins in the past that caused her to have muscle aches but has not experienced this with pravastatin Diet-she says her diet is poor and she does not follow any dietary restrictions. she reports she heavy sweets intake. Exercise-no routine exercise  Diabetes-maintained on metformin. She takes the metformin daily with no adverse reactions. She Checks blood sugars daily with ranges from 63-155. She noticed the lower readings on days that she skips meals Lab Results  Component Value Date   HGBA1C 6.1 02/12/2017    Hypertension- Maintained on amlodipine 5 daily. This dosage was just increased from 2.5 after multiple high readings at home. She brings her daily BP log today with readings: 140s-160s/60s- upper 80s. She occasionally has a low reading such as 110s /60s. She denies headaches, vision changes, CP, SOB, edema.      BP Readings from Last 3 Encounters:  05/25/17 (!) 108/58  02/12/17 120/68  11/09/16 118/64    Lung disease- she  was followed by pulmonology for annual appointemtns when she lived in [place]. 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 She decided not to continue with follow up care. She has been maintained on incruse ellipta 1 puff daily. She feels her symptoms are well controlled with this medication. She denies cough, wheezing, sputum production. She overall feels well.   Review of Systems  See HPI  Past Medical History:  Diagnosis Date  . Allergy   . Arthritis   . Asthma   . Bipolar 1 disorder ([place])   . Colon polyps   . Depression   . Diabetes mellitus without complication ([place])   . GERD (gastroesophageal reflux disease)   . History of alcohol abuse   . History of chicken pox   . History of drug abuse   . Hyperlipidemia   . Hypertension   . Urine incontinence      Social History   Socioeconomic History  . Marital status: Single    Spouse name: Not on file  . Number of children: 1  . Years of education: 52  . Highest education level: Not on file  Social Needs  . Financial resource strain: Not on file  . Food insecurity - worry: Not on file  . Food insecurity - inability: Not on file  . Transportation needs - medical: Not on file  . Transportation needs - non-medical: Not on file  Occupational History  . Occupation: Retired Therapist, sports  Tobacco Use  . Smoking status: Former Smoker    Packs/day: 1.50    Years: 55.00    Pack years: 82.50  Types: Cigarettes  . Smokeless tobacco: Never Used  Substance and Sexual Activity  . Alcohol use: Yes    Alcohol/week: 2.4 - 3.6 oz    Types: 4 - 6 Cans of beer per week  . Drug use: Yes    Frequency: 7.0 times per week    Types: Marijuana  . Sexual activity: Not on file  Other Topics Concern  . Not on file  Social History Narrative   Fun/Hobby: Read   Denies abuse and feels safe at home     Past Surgical History:  Procedure Laterality Date  . APPENDECTOMY    . BREAST BIOPSY    . CHOLECYSTECTOMY    . LUNG BIOPSY    . SUBTOTAL  COLECTOMY      Family History  Problem Relation Age of Onset  . Hyperlipidemia Mother   . Arthritis Father   . Heart disease Father   . Diabetes Father   . Heart disease Paternal Uncle   . Heart disease Paternal Grandmother     Allergies  Allergen Reactions  . Penicillins     Current Outpatient Medications on File Prior to Visit  Medication Sig Dispense Refill  . amLODipine (NORVASC) 5 MG tablet Take 1 tablet (5 mg total) by mouth daily. 30 tablet 3  . aspirin EC 81 MG tablet Take 81 mg by mouth daily.    . Cholecalciferol (VITAMIN D3) 2000 units TABS Take by mouth.    . divalproex (DEPAKOTE) 500 MG DR tablet Take 500 mg by mouth 3 (three) times daily.    Marland Kitchen ezetimibe (ZETIA) 10 MG tablet TAKE 1 TABLET BY MOUTH EVERY DAY 90 tablet 0  . glucose blood (FREESTYLE LITE) test strip 1 each by Other route 2 (two) times daily. Must keep appt for future refills 100 each 0  . metFORMIN (GLUCOPHAGE-XR) 500 MG 24 hr tablet Take 1 tablet (500 mg total) by mouth daily with breakfast. 90 tablet 0  . multivitamin-iron-minerals-folic acid (CENTRUM) chewable tablet Chew 1 tablet by mouth daily.    Marland Kitchen OLANZapine (ZYPREXA) 15 MG tablet Take 5 mg by mouth at bedtime.     Marland Kitchen PARoxetine (PAXIL) 40 MG tablet TAKE 1.5 TABLETS (60 MG TOTAL) BY MOUTH EVERY MORNING. (Patient taking differently: Take 20 mg by mouth every morning. ) 135 tablet 0  . pravastatin (PRAVACHOL) 20 MG tablet Take 1 tablet (20 mg total) by mouth daily. 90 tablet 1  . ranitidine (ZANTAC) 300 MG tablet Take 300 mg by mouth at bedtime.    . tolterodine (DETROL LA) 4 MG 24 hr capsule TAKE 1 CAPSULE (4 MG TOTAL) BY MOUTH DAILY. 90 capsule 1  . umeclidinium bromide (INCRUSE ELLIPTA) 62.5 MCG/INH AEPB Inhale 1 puff into the lungs daily. 90 each 0  . zolpidem (AMBIEN) 10 MG tablet Take 10 mg by mouth at bedtime as needed for sleep.     No current facility-administered medications on file prior to visit.     BP (!) 108/58 (BP Location: Right  Arm, Patient Position: Sitting, Cuff Size: Large)   Pulse 80   Temp 98.3 F (36.8 C) (Oral)   Resp 16   Ht 5\' 4"  (1.626 m)   Wt 187 lb (84.8 kg)   SpO2 95%   BMI 32.10 kg/m        Objective:   Physical Exam  Constitutional: She is oriented to person, place, and time. She appears well-developed and well-nourished. No distress.  HENT:  Head: Normocephalic and atraumatic.  Cardiovascular: Normal  rate, regular rhythm, normal heart sounds and intact distal pulses.  Pulmonary/Chest: Effort normal and breath sounds normal. No respiratory distress. She has no wheezes.  Musculoskeletal: She exhibits no edema.  Neurological: She is alert and oriented to person, place, and time. Coordination normal.  Skin: Skin is warm and dry.  Psychiatric: She has a normal mood and affect. Judgment and thought content normal.      Assessment & Plan:  Shell return for labs when fasting. FU visit in 2 weeks-for blood pressure management.  Need for Tdap vaccination  - Tdap vaccine greater than or equal to 7yo IM

## 2017-05-27 NOTE — Assessment & Plan Note (Addendum)
Readings at home inconsistent with readings in clinic. Overall her home readings are elevated. She will return to clinic next week with her BP cuff to calibrate. If her BP cuff Is correct we will add lisinopril 10 daily to her regimen-added benefit of renal protection as she is diabetic. We will continue amlodipine 5 daily. Medications ordered: - lisinopril (PRINIVIL,ZESTRIL) 10 MG tablet; Take 1 tablet (10 mg total) by mouth daily.  Dispense: 30 tablet; Refill: 1 Diagnostic testing ordered: - Comprehensive metabolic panel; Future Discussed healthyt diet and exercise.

## 2017-05-27 NOTE — Assessment & Plan Note (Signed)
Recent dosage increase of pravastatin. Continue zetia and pravastatin at current dosages. Diagnostic testing ordered: - Lipid panel; Future -cmet future Medications ordered: - ezetimibe (ZETIA) 10 MG tablet; Take 1 tablet (10 mg total) by mouth daily.  Dispense: 90 tablet; Refill: 0

## 2017-05-27 NOTE — Assessment & Plan Note (Signed)
Recent A1c 6.1 Continue metformin. Discussed Healthy diet and exercise including eating three meals a day to avoid hypoglycemia. Medications ordered: - metFORMIN (GLUCOPHAGE-XR) 500 MG 24 hr tablet; Take 1 tablet (500 mg total) by mouth daily with breakfast.  Dispense: 90 tablet; Refill: 0 Discussed annual eye exam- she has upcoming ophthalmology appointment next month.

## 2017-05-27 NOTE — Assessment & Plan Note (Signed)
Maintained on incruse with adequate control of symptoms. She prefers not to return to pulmonology for routine care, however due to her rare condition:DIP, it may be wise for her to establish with pulm here in [place] for safe care. We will discuss this at an upcoming visit and see if she will be willing to go. Medications ordered: - umeclidinium bromide (INCRUSE ELLIPTA) 62.5 MCG/INH AEPB; Inhale 1 puff into the lungs daily.  Dispense: 90 each; Refill: 0

## 2017-06-01 ENCOUNTER — Other Ambulatory Visit (INDEPENDENT_AMBULATORY_CARE_PROVIDER_SITE_OTHER): Payer: Medicare Other

## 2017-06-01 DIAGNOSIS — E782 Mixed hyperlipidemia: Secondary | ICD-10-CM | POA: Diagnosis not present

## 2017-06-01 DIAGNOSIS — I10 Essential (primary) hypertension: Secondary | ICD-10-CM | POA: Diagnosis not present

## 2017-06-01 LAB — COMPREHENSIVE METABOLIC PANEL
ALBUMIN: 4.1 g/dL (ref 3.5–5.2)
ALK PHOS: 74 U/L (ref 39–117)
ALT: 14 U/L (ref 0–35)
AST: 13 U/L (ref 0–37)
BILIRUBIN TOTAL: 0.4 mg/dL (ref 0.2–1.2)
BUN: 21 mg/dL (ref 6–23)
CALCIUM: 9.6 mg/dL (ref 8.4–10.5)
CO2: 29 mEq/L (ref 19–32)
Chloride: 106 mEq/L (ref 96–112)
Creatinine, Ser: 1.15 mg/dL (ref 0.40–1.20)
GFR: 49.29 mL/min — AB (ref >60.00)
Glucose, Bld: 120 mg/dL — ABNORMAL HIGH (ref 70–99)
POTASSIUM: 4.5 meq/L (ref 3.5–5.1)
Sodium: 143 mEq/L (ref 135–145)
TOTAL PROTEIN: 7 g/dL (ref 6.0–8.3)

## 2017-06-01 LAB — LIPID PANEL
CHOLESTEROL: 220 mg/dL — AB (ref 0–200)
HDL: 45 mg/dL (ref >39.00)
NONHDL: 175.23
TRIGLYCERIDES: 327 mg/dL — AB (ref 0.0–149.0)
Total CHOL/HDL Ratio: 5
VLDL: 65.4 mg/dL — ABNORMAL HIGH (ref 0.0–40.0)

## 2017-06-01 LAB — LDL CHOLESTEROL, DIRECT: LDL DIRECT: 127 mg/dL

## 2017-06-04 ENCOUNTER — Other Ambulatory Visit: Payer: Self-pay | Admitting: Nurse Practitioner

## 2017-06-04 DIAGNOSIS — E782 Mixed hyperlipidemia: Secondary | ICD-10-CM

## 2017-06-04 MED ORDER — PRAVASTATIN SODIUM 40 MG PO TABS: 40.0000 mg | ORAL_TABLET | Freq: Every day | ORAL | 2 refills | Status: DC

## 2017-06-04 MED ORDER — PRAVASTATIN SODIUM 40 MG PO TABS: 20.0000 mg | ORAL_TABLET | Freq: Every day | ORAL | 2 refills | Status: DC

## 2017-06-08 ENCOUNTER — Ambulatory Visit (INDEPENDENT_AMBULATORY_CARE_PROVIDER_SITE_OTHER): Payer: Medicare Other | Admitting: Family

## 2017-06-08 ENCOUNTER — Encounter: Payer: Self-pay | Admitting: Family

## 2017-06-08 VITALS — BP 128/76 | HR 75 | Temp 98.4°F | Ht 64.0 in | Wt 187.1 lb

## 2017-06-08 DIAGNOSIS — I10 Essential (primary) hypertension: Secondary | ICD-10-CM | POA: Diagnosis not present

## 2017-06-08 DIAGNOSIS — E782 Mixed hyperlipidemia: Secondary | ICD-10-CM

## 2017-06-08 NOTE — Progress Notes (Signed)
Paula [name] is a 72 y.o. female with the following history as recorded in Epic[facility]:  Patient Active Problem List   Diagnosis Date Noted  . Type 2 diabetes mellitus, controlled ([place]) 11/09/2016  . Essential hypertension 11/09/2016  . Mixed hyperlipidemia 11/09/2016  . Bipolar 2 disorder ([place]) 11/09/2016  . Overactive bladder 11/09/2016  . COPD (chronic obstructive pulmonary disease) ([place]) 11/09/2016    Current Outpatient Medications  Medication Sig Dispense Refill  . amLODipine (NORVASC) 5 MG tablet Take 1 tablet (5 mg total) by mouth daily. 30 tablet 3  . aspirin EC 81 MG tablet Take 81 mg by mouth daily.    . Cholecalciferol (VITAMIN D3) 2000 units TABS Take by mouth.    . divalproex (DEPAKOTE) 500 MG DR tablet Take 500 mg by mouth 3 (three) times daily.    Marland Kitchen ezetimibe (ZETIA) 10 MG tablet Take 1 tablet (10 mg total) by mouth daily. 90 tablet 0  . glucose blood (FREESTYLE LITE) test strip 1 each by Other route 2 (two) times daily. Must keep appt for future refills 100 each 0  . lisinopril (PRINIVIL,ZESTRIL) 10 MG tablet Take 1 tablet (10 mg total) by mouth daily. 30 tablet 1  . metFORMIN (GLUCOPHAGE-XR) 500 MG 24 hr tablet Take 1 tablet (500 mg total) by mouth daily with breakfast. 90 tablet 0  . multivitamin-iron-minerals-folic acid (CENTRUM) chewable tablet Chew 1 tablet by mouth daily.    Marland Kitchen OLANZapine (ZYPREXA) 15 MG tablet Take 5 mg by mouth at bedtime.     Marland Kitchen PARoxetine (PAXIL) 40 MG tablet TAKE 1.5 TABLETS (60 MG TOTAL) BY MOUTH EVERY MORNING. (Patient taking differently: Take 20 mg by mouth every morning. ) 135 tablet 0  . pravastatin (PRAVACHOL) 40 MG tablet Take 1 tablet (40 mg total) by mouth daily. 30 tablet 2  . ranitidine (ZANTAC) 300 MG tablet Take 300 mg by mouth at bedtime.    . tolterodine (DETROL LA) 4 MG 24 hr capsule TAKE 1 CAPSULE (4 MG TOTAL) BY MOUTH DAILY. 90 capsule 1  . umeclidinium bromide (INCRUSE ELLIPTA) 62.5 MCG/INH AEPB Inhale 1 puff into the lungs daily.  90 each 0  . zolpidem (AMBIEN) 10 MG tablet Take 10 mg by mouth at bedtime as needed for sleep.     No current facility-administered medications for this visit.     Allergies: Penicillins  Past Medical History:  Diagnosis Date  . Allergy   . Arthritis   . Asthma   . Bipolar 1 disorder ([place])   . Colon polyps   . Depression   . Diabetes mellitus without complication ([place])   . GERD (gastroesophageal reflux disease)   . History of alcohol abuse   . History of chicken pox   . History of drug abuse   . Hyperlipidemia   . Hypertension   . Urine incontinence     Past Surgical History:  Procedure Laterality Date  . APPENDECTOMY    . BREAST BIOPSY    . CHOLECYSTECTOMY    . LUNG BIOPSY    . SUBTOTAL COLECTOMY      Family History  Problem Relation Age of Onset  . Hyperlipidemia Mother   . Arthritis Father   . Heart disease Father   . Diabetes Father   . Heart disease Paternal Uncle   . Heart disease Paternal Grandmother     Social History   Tobacco Use  . Smoking status: Former Smoker    Packs/day: 1.50    Years: 55.00  Pack years: 82.50    Types: Cigarettes  . Smokeless tobacco: Never Used  Substance Use Topics  . Alcohol use: Yes    Alcohol/week: 2.4 - 3.6 oz    Types: 4 - 6 Cans of beer per week    Subjective:  Patient was seen 2 weeks ago with concerns regarding blood pressure control; has continued to keep a log and pressure has been averaging 120-138/68-86; she brings her cuff in today for comparison and numbers are consistent with cuff in the office; ( our cuff on initial exam was 142/79 and hers was 138/76); feels that stress had been up with the holidays; Denies any chest pain, shortness of breath, blurred vision or headache   Objective:  Vitals:   06/08/17 1207 06/08/17 1251  BP: (!) 142/79 128/76  Pulse: 75   Temp: 98.4 F (36.9 C)   TempSrc: Oral   SpO2: 99%   Weight: 187 lb 1.9 oz (84.9 kg)   Height: 5\' 4"  (6.962 m)     General: Well developed,  well nourished, in no acute distress  Skin : Warm and dry.  Head: Normocephalic and atraumatic  Eyes: Sclera and conjunctiva clear; pupils round and reactive to light; extraocular movements intact  Lungs: Respirations unlabored; clear to auscultation bilaterally without wheeze, rales, rhonchi  CVS exam: normal rate and regular rhythm.  Neurologic: Alert and oriented; speech intact; face symmetrical; moves all extremities well; CNII-XII intact without focal deficit  Assessment:  1. Essential hypertension   2. Mixed hyperlipidemia     Plan:  1. Stable; do not feel she needs second agent at this time; continue Amlodipine 5 mg daily; continue to monitor and follow up if consistently above 140/90; 2. Reminded patient she is due for labs in mid-February; follow up with her PCP as instructed based on those labs.   No Follow-up on file.  No orders of the defined types were placed in this encounter.   Requested Prescriptions    No prescriptions requested or ordered in this encounter

## 2017-06-08 NOTE — Patient Instructions (Signed)
Please plan to follow-up for fasting labs in mid-February and then [facility] will be in touch;

## 2017-06-12 DIAGNOSIS — F3131 Bipolar disorder, current episode depressed, mild: Secondary | ICD-10-CM | POA: Diagnosis not present

## 2017-06-22 DIAGNOSIS — H1859 Other hereditary corneal dystrophies: Secondary | ICD-10-CM | POA: Diagnosis not present

## 2017-06-22 DIAGNOSIS — H04123 Dry eye syndrome of bilateral lacrimal glands: Secondary | ICD-10-CM | POA: Diagnosis not present

## 2017-06-22 DIAGNOSIS — E119 Type 2 diabetes mellitus without complications: Secondary | ICD-10-CM | POA: Diagnosis not present

## 2017-06-22 DIAGNOSIS — H5213 Myopia, bilateral: Secondary | ICD-10-CM | POA: Diagnosis not present

## 2017-06-22 LAB — HM DIABETES EYE EXAM

## 2017-06-26 ENCOUNTER — Encounter: Payer: Self-pay | Admitting: Nurse Practitioner

## 2017-07-10 ENCOUNTER — Other Ambulatory Visit: Payer: Self-pay

## 2017-07-10 MED ORDER — GLUCOSE BLOOD VI STRP: ORAL_STRIP | 12 refills | Status: DC

## 2017-07-24 ENCOUNTER — Other Ambulatory Visit: Payer: Self-pay | Admitting: Nurse Practitioner

## 2017-07-25 DIAGNOSIS — F3131 Bipolar disorder, current episode depressed, mild: Secondary | ICD-10-CM | POA: Diagnosis not present

## 2017-07-31 ENCOUNTER — Other Ambulatory Visit (INDEPENDENT_AMBULATORY_CARE_PROVIDER_SITE_OTHER): Payer: Medicare Other

## 2017-07-31 ENCOUNTER — Other Ambulatory Visit: Payer: Self-pay

## 2017-07-31 ENCOUNTER — Other Ambulatory Visit (HOSPITAL_COMMUNITY)
Admission: RE | Admit: 2017-07-31 | Discharge: 2017-07-31 | Disposition: A | Discharge status: Home / Self Care | Payer: Medicare Other | Source: Ambulatory Visit | Attending: Psychiatry | Admitting: Psychiatry

## 2017-07-31 DIAGNOSIS — E782 Mixed hyperlipidemia: Secondary | ICD-10-CM

## 2017-07-31 DIAGNOSIS — F3181 Bipolar II disorder: Secondary | ICD-10-CM

## 2017-07-31 LAB — COMPREHENSIVE METABOLIC PANEL
ALBUMIN: 4 g/dL (ref 3.5–5.0)
ALK PHOS: 73 U/L (ref 38–126)
ALT: 19 U/L (ref 14–54)
ALT: 14 U/L (ref 0–35)
AST: 17 U/L (ref 15–41)
AST: 12 U/L (ref 0–37)
Albumin: 3.9 g/dL (ref 3.5–5.2)
Alkaline Phosphatase: 70 U/L (ref 39–117)
Anion gap: 12 (ref 5–15)
BILIRUBIN TOTAL: 0.5 mg/dL (ref 0.3–1.2)
BILIRUBIN TOTAL: 0.3 mg/dL (ref 0.2–1.2)
BUN: 22 mg/dL — AB (ref 6–20)
BUN: 22 mg/dL (ref 6–23)
CALCIUM: 10.2 mg/dL (ref 8.4–10.5)
CHLORIDE: 106 meq/L (ref 96–112)
CO2: 24 mmol/L (ref 22–32)
CO2: 28 mEq/L (ref 19–32)
Calcium: 9.6 mg/dL (ref 8.9–10.3)
Chloride: 107 mmol/L (ref 101–111)
Creatinine, Ser: 1.17 mg/dL — ABNORMAL HIGH (ref 0.44–1.00)
Creatinine, Ser: 1.21 mg/dL — ABNORMAL HIGH (ref 0.40–1.20)
GFR calc Af Amer: 53 mL/min — ABNORMAL LOW (ref >60)
GFR calc non Af Amer: 45 mL/min — ABNORMAL LOW (ref >60)
GFR: 46.46 mL/min — AB (ref >60.00)
GLUCOSE: 125 mg/dL — AB (ref 65–99)
Glucose, Bld: 125 mg/dL — ABNORMAL HIGH (ref 70–99)
POTASSIUM: 4.2 mmol/L (ref 3.5–5.1)
POTASSIUM: 4.5 meq/L (ref 3.5–5.1)
Sodium: 143 mmol/L (ref 135–145)
Sodium: 144 mEq/L (ref 135–145)
TOTAL PROTEIN: 7.5 g/dL (ref 6.5–8.1)
Total Protein: 7.1 g/dL (ref 6.0–8.3)

## 2017-07-31 LAB — LIPID PANEL
CHOL/HDL RATIO: 4
Cholesterol: 150 mg/dL (ref 0–200)
HDL: 37.1 mg/dL — AB (ref >39.00)
LDL CALC: 86 mg/dL (ref 0–99)
NonHDL: 112.51
Triglycerides: 135 mg/dL (ref 0.0–149.0)
VLDL: 27 mg/dL (ref 0.0–40.0)

## 2017-07-31 LAB — VALPROIC ACID LEVEL: Valproic Acid Lvl: 32 ug/mL — ABNORMAL LOW (ref 50.0–100.0)

## 2017-07-31 LAB — TSH: TSH: 0.64 u[IU]/mL (ref 0.35–4.50)

## 2017-08-01 ENCOUNTER — Other Ambulatory Visit (INDEPENDENT_AMBULATORY_CARE_PROVIDER_SITE_OTHER): Payer: Medicare Other

## 2017-08-01 DIAGNOSIS — F3181 Bipolar II disorder: Secondary | ICD-10-CM

## 2017-08-01 LAB — CBC
HCT: 38.4 % (ref 36.0–46.0)
Hemoglobin: 13.1 g/dL (ref 12.0–15.0)
MCHC: 34.2 g/dL (ref 30.0–36.0)
MCV: 97.3 fl (ref 78.0–100.0)
Platelets: 209 10*3/uL (ref 150.0–400.0)
RBC: 3.95 Mil/uL (ref 3.87–5.11)
RDW: 11.4 % — ABNORMAL LOW (ref 11.5–15.5)
WBC: 7.3 10*3/uL (ref 4.0–10.5)

## 2017-08-02 LAB — VALPROIC ACID LEVEL: Valproic Acid Lvl: 29.1 mg/L — ABNORMAL LOW (ref 50.0–100.0)

## 2017-08-07 ENCOUNTER — Ambulatory Visit (INDEPENDENT_AMBULATORY_CARE_PROVIDER_SITE_OTHER): Payer: Medicare Other | Admitting: Nurse Practitioner

## 2017-08-07 ENCOUNTER — Encounter: Payer: Self-pay | Admitting: Nurse Practitioner

## 2017-08-07 VITALS — BP 122/68 | HR 73 | Temp 98.1°F | Resp 16 | Ht 64.0 in | Wt 179.0 lb

## 2017-08-07 DIAGNOSIS — J449 Chronic obstructive pulmonary disease, unspecified: Secondary | ICD-10-CM

## 2017-08-07 DIAGNOSIS — N183 Chronic kidney disease, stage 3 unspecified: Secondary | ICD-10-CM | POA: Insufficient documentation

## 2017-08-07 DIAGNOSIS — E119 Type 2 diabetes mellitus without complications: Secondary | ICD-10-CM

## 2017-08-07 DIAGNOSIS — E782 Mixed hyperlipidemia: Secondary | ICD-10-CM

## 2017-08-07 DIAGNOSIS — I10 Essential (primary) hypertension: Secondary | ICD-10-CM

## 2017-08-07 MED ORDER — PRAVASTATIN SODIUM 40 MG PO TABS: 40.0000 mg | ORAL_TABLET | Freq: Every day | ORAL | 1 refills | Status: DC

## 2017-08-07 MED ORDER — EZETIMIBE 10 MG PO TABS: 10.0000 mg | ORAL_TABLET | Freq: Every day | ORAL | 1 refills | Status: DC

## 2017-08-07 NOTE — Progress Notes (Signed)
Name: Paula [name]   MRN: [id number]    DOB: April 05, 1946   Date:08/07/2017       Progress Note  Subjective  Chief Complaint  No chief complaint on file.   HPI She is here for a blood pressure follow up. She would also like a refill of her cholesterol medication today  Hypertension -maintained on amlodipine 5- some inconsistency in readings since December so she has been monitoring at home and returns today with log for follow up Reports daily medication compliance without adverse medication effects. Reports 120s-130s/60s-70s at home Denies headaches, vision changes, chest pain, shortness of breath, edema.  BP Readings from Last 3 Encounters:  08/07/17 122/68  06/08/17 128/76  05/25/17 (!) 108/58   Cholesterol- maintained on zetia 10 daily Pravastatin dosage increased to 40 daily about 3 months ago due to continued elevated cholesterol- she returned for follow up CMET and Lipid panel on 2/27 Reports daily medication compliance and feels she is tolerating the new medication dosage well - denies myalgias. Diet- she tries to eat a healthy low carb, low sugar diet Exercise- no routine exercise  Lab Results  Component Value Date   CHOL 150 07/31/2017   HDL 37.10 (L) 07/31/2017   LDLCALC 86 07/31/2017   LDLDIRECT 127.0 06/01/2017   TRIG 135.0 07/31/2017   CHOLHDL 4 07/31/2017   Diabetes- maintained on metformin 500 daily Reports daily medication compliance without adverse medication effects. home blood sugar readings today 90s-130s. Denies tremor, diaphoresis, polyuria, polydipsia, polyphagia.  Lab Results  Component Value Date   HGBA1C 6.1 02/12/2017    Patient Active Problem List   Diagnosis Date Noted  . Type 2 diabetes mellitus, controlled ([place]) 11/09/2016  . Essential hypertension 11/09/2016  . Mixed hyperlipidemia 11/09/2016  . Bipolar 2 disorder ([place]) 11/09/2016  . Overactive bladder 11/09/2016  . COPD (chronic obstructive pulmonary disease) ([place]) 11/09/2016     Past Surgical History:  Procedure Laterality Date  . APPENDECTOMY    . BREAST BIOPSY    . CHOLECYSTECTOMY    . LUNG BIOPSY    . SUBTOTAL COLECTOMY      Family History  Problem Relation Age of Onset  . Hyperlipidemia Mother   . Arthritis Father   . Heart disease Father   . Diabetes Father   . Heart disease Paternal Uncle   . Heart disease Paternal Grandmother     Social History   Socioeconomic History  . Marital status: Single    Spouse name: Not on file  . Number of children: 1  . Years of education: 46  . Highest education level: Not on file  Social Needs  . Financial resource strain: Not on file  . Food insecurity - worry: Not on file  . Food insecurity - inability: Not on file  . Transportation needs - medical: Not on file  . Transportation needs - non-medical: Not on file  Occupational History  . Occupation: Retired Therapist, sports  Tobacco Use  . Smoking status: Former Smoker    Packs/day: 1.50    Years: 55.00    Pack years: 82.50    Types: Cigarettes  . Smokeless tobacco: Never Used  Substance and Sexual Activity  . Alcohol use: Yes    Alcohol/week: 2.4 - 3.6 oz    Types: 4 - 6 Cans of beer per week  . Drug use: Yes    Frequency: 7.0 times per week    Types: Marijuana  . Sexual activity: Not on file  Other Topics Concern  . Not  on file  Social History Narrative   Fun/Hobby: Read   Denies abuse and feels safe at home      Current Outpatient Medications:  .  amLODipine (NORVASC) 5 MG tablet, TAKE 1 TABLET BY MOUTH EVERY DAY, Disp: 30 tablet, Rfl: 3 .  aspirin EC 81 MG tablet, Take 81 mg by mouth daily., Disp: , Rfl:  .  Cholecalciferol (VITAMIN D3) 2000 units TABS, Take by mouth., Disp: , Rfl:  .  divalproex (DEPAKOTE) 500 MG DR tablet, Take 500 mg by mouth 3 (three) times daily., Disp: , Rfl:  .  ezetimibe (ZETIA) 10 MG tablet, Take 1 tablet (10 mg total) by mouth daily., Disp: 90 tablet, Rfl: 0 .  glucose blood (FREESTYLE LITE) test strip, TEST TWICE A  DAY. (E11.9), Disp: 100 each, Rfl: 12 .  metFORMIN (GLUCOPHAGE-XR) 500 MG 24 hr tablet, Take 1 tablet (500 mg total) by mouth daily with breakfast., Disp: 90 tablet, Rfl: 0 .  multivitamin-iron-minerals-folic acid (CENTRUM) chewable tablet, Chew 1 tablet by mouth daily., Disp: , Rfl:  .  OLANZapine (ZYPREXA) 15 MG tablet, Take 5 mg by mouth at bedtime. , Disp: , Rfl:  .  PARoxetine (PAXIL) 40 MG tablet, TAKE 1.5 TABLETS (60 MG TOTAL) BY MOUTH EVERY MORNING. (Patient taking differently: Take 20 mg by mouth every morning. ), Disp: 135 tablet, Rfl: 0 .  pravastatin (PRAVACHOL) 40 MG tablet, Take 1 tablet (40 mg total) by mouth daily., Disp: 30 tablet, Rfl: 2 .  ranitidine (ZANTAC) 300 MG tablet, Take 300 mg by mouth at bedtime., Disp: , Rfl:  .  tolterodine (DETROL LA) 4 MG 24 hr capsule, TAKE 1 CAPSULE (4 MG TOTAL) BY MOUTH DAILY., Disp: 90 capsule, Rfl: 1 .  umeclidinium bromide (INCRUSE ELLIPTA) 62.5 MCG/INH AEPB, Inhale 1 puff into the lungs daily., Disp: 90 each, Rfl: 0 .  zolpidem (AMBIEN) 10 MG tablet, Take 10 mg by mouth at bedtime as needed for sleep., Disp: , Rfl:   Allergies  Allergen Reactions  . Penicillins      ROS See HPI  Objective  Vitals:   08/07/17 1055  BP: 122/68  Pulse: 73  Resp: 16  Temp: 98.1 F (36.7 C)  TempSrc: Oral  SpO2: 93%  Weight: 179 lb (81.2 kg)  Height: 5\' 4"  (1.626 m)    Body mass index is 30.73 kg/m.  Physical Exam Vital signs reviewed. Constitutional: Patient appears well-developed and well-nourished. No distress.  HENT: Head: Normocephalic and atraumatic. Nose: Nose normal. Mouth/Throat: Oropharynx is clear and moist.  Eyes: Conjunctivae and EOM are normal. No scleral icterus.  Neck: Normal range of motion. Neck supple.  Cardiovascular: Normal rate, regular rhythm and normal heart sounds.  No BLE edema. Distal pulses intact. Pulmonary/Chest: Effort normal and breath sounds normal. No respiratory distress. Musculoskeletal: Normal range  of motion, no joint effusions. No gross deformities Neurological: She is alert and oriented to person, place, and time. Coordination, balance, strength, speech and gait are normal.  Skin: Skin is warm and dry. No rash noted. No erythema.  Psychiatric: Patient has a normal mood and affect. behavior is normal. Judgment and thought content normal.   Assessment & Plan RTC in 3 months for diabetes follow up, a1c, bmet Discussed the role of healthy diet and exercise in the management of HLD, HTN, DM-See AVS for information provided to patient

## 2017-08-07 NOTE — Assessment & Plan Note (Signed)
Continue to monitor Will trial pt off metformin due to good control of diabetes and decreased renal function

## 2017-08-07 NOTE — Patient Instructions (Addendum)
STOP your metformin. Monitor your blood sugars at home, and let me know if you are getting readings above 154.  Please return in about 3 months, we will recheck your A1c.   Mediterranean Diet A Mediterranean diet refers to food and lifestyle choices that are based on the traditions of countries located on the [employer]. This way of eating has been shown to help prevent certain conditions and improve outcomes for people who have chronic diseases, like kidney disease and heart disease. What are tips for following this plan? Lifestyle  Cook and eat meals together with your family, when possible.  Drink enough fluid to keep your urine clear or pale yellow.  Be physically active every day. This includes: ? Aerobic exercise like running or swimming. ? Leisure activities like gardening, walking, or housework.  Get 7-8 hours of sleep each night.  If recommended by your health care provider, drink red wine in moderation. This means 1 glass a day for nonpregnant women and 2 glasses a day for men. A glass of wine equals 5 oz (150 mL). Reading food labels  Check the serving size of packaged foods. For foods such as rice and pasta, the serving size refers to the amount of cooked product, not dry.  Check the total fat in packaged foods. Avoid foods that have saturated fat or trans fats.  Check the ingredients list for added sugars, such as corn syrup. Shopping  At the grocery store, buy most of your food from the areas near the walls of the store. This includes: ? Fresh fruits and vegetables (produce). ? Grains, beans, nuts, and seeds. Some of these may be available in unpackaged forms or large amounts (in bulk). ? Fresh seafood. ? Poultry and eggs. ? Low-fat dairy products.  Buy whole ingredients instead of prepackaged foods.  Buy fresh fruits and vegetables in-season from local farmers markets.  Buy frozen fruits and vegetables in resealable bags.  If you do not have access to  quality fresh seafood, buy precooked frozen shrimp or canned fish, such as tuna, salmon, or sardines.  Buy small amounts of raw or cooked vegetables, salads, or olives from the deli or salad bar at your store.  Stock your pantry so you always have certain foods on hand, such as olive oil, canned tuna, canned tomatoes, rice, pasta, and beans. Cooking  Cook foods with extra-virgin olive oil instead of using butter or other vegetable oils.  Have meat as a side dish, and have vegetables or grains as your main dish. This means having meat in small portions or adding small amounts of meat to foods like pasta or stew.  Use beans or vegetables instead of meat in common dishes like chili or lasagna.  Experiment with different cooking methods. Try roasting or broiling vegetables instead of steaming or sauteing them.  Add frozen vegetables to soups, stews, pasta, or rice.  Add nuts or seeds for added healthy fat at each meal. You can add these to yogurt, salads, or vegetable dishes.  Marinate fish or vegetables using olive oil, lemon juice, garlic, and fresh herbs. Meal planning  Plan to eat 1 vegetarian meal one day each week. Try to work up to 2 vegetarian meals, if possible.  Eat seafood 2 or more times a week.  Have healthy snacks readily available, such as: ? Vegetable sticks with hummus. ? [place] yogurt. ? Fruit and nut trail mix.  Eat balanced meals throughout the week. This includes: ? Fruit: 2-3 servings a day ?  Vegetables: 4-5 servings a day ? Low-fat dairy: 2 servings a day ? Fish, poultry, or lean meat: 1 serving a day ? Beans and legumes: 2 or more servings a week ? Nuts and seeds: 1-2 servings a day ? Whole grains: 6-8 servings a day ? Extra-virgin olive oil: 3-4 servings a day  Limit red meat and sweets to only a few servings a month What are my food choices?  Mediterranean diet ? Recommended ? Grains: Whole-grain pasta. Brown rice. Bulgar wheat. Polenta. Couscous.  Whole-wheat bread. [place]. ? Vegetables: Artichokes. Beets. Broccoli. Cabbage. Carrots. Eggplant. Green beans. Chard. Kale. Spinach. Onions. Leeks. Peas. Squash. Tomatoes. Peppers. Radishes. ? Fruits: Apples. Apricots. Avocado. Berries. Bananas. Cherries. Dates. Figs. Grapes. Lemons. Melon. Oranges. Peaches. Plums. Pomegranate. ? Meats and other protein foods: Beans. Almonds. Sunflower seeds. Pine nuts. Peanuts. [place]. Salmon. Scallops. Shrimp. [place]. Tilapia. Clams. Oysters. Eggs. ? Dairy: Low-fat milk. Cheese. Greek yogurt. ? Beverages: Water. Red wine. Herbal tea. ? Fats and oils: Extra virgin olive oil. Avocado oil. Grape seed oil. ? Sweets and desserts: [place] yogurt with honey. Baked apples. Poached pears. Trail mix. ? Seasoning and other foods: Basil. Cilantro. Coriander. Cumin. Mint. Parsley. Sage. Rosemary. Tarragon. Garlic. Oregano. Thyme. Pepper. Balsalmic vinegar. Tahini. Hummus. Tomato sauce. Olives. Mushrooms. ? Limit these ? Grains: Prepackaged pasta or rice dishes. Prepackaged cereal with added sugar. ? Vegetables: Deep fried potatoes (french fries). ? Fruits: Fruit canned in syrup. ? Meats and other protein foods: Beef. Pork. Lamb. Poultry with skin. Hot dogs. [place] Salines. ? Dairy: Ice cream. Sour cream. Whole milk. ? Beverages: Juice. Sugar-sweetened soft drinks. Beer. Liquor and spirits. ? Fats and oils: Butter. Canola oil. Vegetable oil. Beef fat (tallow). Lard. ? Sweets and desserts: Cookies. Cakes. Pies. Candy. ? Seasoning and other foods: Mayonnaise. Premade sauces and marinades. ? The items listed may not be a complete list. Talk with your dietitian about what dietary choices are right for you. Summary  The Mediterranean diet includes both food and lifestyle choices.  Eat a variety of fresh fruits and vegetables, beans, nuts, seeds, and whole grains.  Limit the amount of red meat and sweets that you eat.  Talk with your health care provider about whether it is safe  for you to drink red wine in moderation. This means 1 glass a day for nonpregnant women and 2 glasses a day for men. A glass of wine equals 5 oz (150 mL). This information is not intended to replace advice given to you by your health care provider. Make sure you discuss any questions you have with your health care provider. Document Released: 01/13/2016 Document Revised: 02/15/2016 Document Reviewed: 01/13/2016 Elsevier Interactive Patient Education  [employer].

## 2017-08-07 NOTE — Assessment & Plan Note (Signed)
Cholesterol has improved on pravastatin 40 CMET on 2/26 shows normal LFTS Continue pravastatin 40, zetia 10 - pravastatin (PRAVACHOL) 40 MG tablet; Take 1 tablet (40 mg total) by mouth daily.  Dispense: 90 tablet; Refill: 1 - ezetimibe (ZETIA) 10 MG tablet; Take 1 tablet (10 mg total) by mouth daily.  Dispense: 90 tablet; Refill: 1

## 2017-08-07 NOTE — Assessment & Plan Note (Signed)
Declining kidney function trend on labs Last A1c was 6.1 and she reports sugars at home ranging from 90s-130s on a low dose of metformin-500mg  daily We will trial her off metformin and she will RTC in 3 months for follow up A1c, BMET Discussed return precautions including blood sugar parameters for home monitoring

## 2017-08-07 NOTE — Assessment & Plan Note (Signed)
We discussed referral to pulm for monitoring of COPD, rare condition DIP as discussed at last OV and she is agreeable - Ambulatory referral to Pulmonology

## 2017-08-07 NOTE — Assessment & Plan Note (Signed)
Appears stable now, will continue amlodipine 5 daily and she will continue to monitor at home

## 2017-08-22 ENCOUNTER — Other Ambulatory Visit: Payer: Self-pay | Admitting: Nurse Practitioner

## 2017-08-22 DIAGNOSIS — E119 Type 2 diabetes mellitus without complications: Secondary | ICD-10-CM

## 2017-08-24 DIAGNOSIS — H04123 Dry eye syndrome of bilateral lacrimal glands: Secondary | ICD-10-CM | POA: Diagnosis not present

## 2017-08-24 DIAGNOSIS — H1859 Other hereditary corneal dystrophies: Secondary | ICD-10-CM | POA: Diagnosis not present

## 2017-08-27 DIAGNOSIS — F3131 Bipolar disorder, current episode depressed, mild: Secondary | ICD-10-CM | POA: Diagnosis not present

## 2017-08-30 ENCOUNTER — Ambulatory Visit (INDEPENDENT_AMBULATORY_CARE_PROVIDER_SITE_OTHER): Payer: Medicare Other | Admitting: Internal Medicine

## 2017-08-30 ENCOUNTER — Ambulatory Visit (INDEPENDENT_AMBULATORY_CARE_PROVIDER_SITE_OTHER)
Admission: RE | Admit: 2017-08-30 | Discharge: 2017-08-30 | Disposition: A | Discharge status: Home / Self Care | Payer: Medicare Other | Source: Ambulatory Visit | Attending: Internal Medicine | Admitting: Internal Medicine

## 2017-08-30 ENCOUNTER — Encounter: Payer: Self-pay | Admitting: Internal Medicine

## 2017-08-30 VITALS — BP 112/64 | HR 71 | Ht 64.0 in | Wt 177.8 lb

## 2017-08-30 DIAGNOSIS — J84117 Desquamative interstitial pneumonia: Secondary | ICD-10-CM | POA: Diagnosis not present

## 2017-08-30 DIAGNOSIS — J449 Chronic obstructive pulmonary disease, unspecified: Secondary | ICD-10-CM

## 2017-08-30 NOTE — Patient Instructions (Signed)
Please remember to go to the  x-ray department downstairs in the basement  for your tests - we will call you with the results when they are available.      Please schedule a follow up office visit in 4 weeks, sooner if needed with pfts on return

## 2017-08-30 NOTE — Progress Notes (Signed)
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 Pulmonary Consult    Referred by Cheron Schaumann, NP. Pt c/o DOE x 10 yrs. She gets winded walking up an incline.   overall better breathing since stop smoking and now MMRC1 = can walk nl pace, flat grade, can't hurry or go uphills or steps s sob   No cough/ no noct symptoms / stopped 02 2 weeks prior to OV  And no worse  Stopped incruse also around 2 weeks prior to OV   And also no worse    . No obvious day to day or daytime variability or assoc excess/ purulent sputum or mucus plugs or hemoptysis or cp or chest tightness, subjective wheeze or overt sinus or hb symptoms. No unusual exposure hx or h/o childhood pna/ asthma or knowledge of premature birth.  Sleeping ok flat without nocturnal  or early am exacerbation  of respiratory  c/o's or need for noct saba. Also denies any obvious fluctuation of symptoms with weather or environmental changes or other aggravating or alleviating factors except as outlined above   Current Allergies, Complete Past Medical History, Past Surgical History, Family History, and Social History were reviewed in [employer] record.  ROS  The following are not active complaints unless bolded Hoarseness, sore throat, dysphagia, dental problems, itching, sneezing,  nasal congestion or discharge of excess mucus or purulent secretions, ear ache,   fever, chills, sweats, unintended wt loss or wt gain, classically pleuritic or exertional cp,  orthopnea pnd or leg swelling, presyncope, palpitations, abdominal pain, anorexia, nausea, vomiting, diarrhea   or change in bowel habits or change in bladder habits, change in stools or change in urine, dysuria, hematuria,  rash, arthralgias, visual complaints, headache, numbness, weakness or ataxia or problems with walking or coordination,  change in mood/affect or memory.        Current Meds  Medication Sig  . amLODipine (NORVASC) 5 MG tablet TAKE 1 TABLET BY MOUTH EVERY DAY  . aspirin EC 81 MG tablet Take 81 mg by mouth daily.  . Cholecalciferol (VITAMIN D3) 2000 units TABS Take by mouth.  . divalproex (DEPAKOTE) 500 MG DR tablet Take 500 mg by mouth 3 (three) times daily.  Marland Kitchen ezetimibe (ZETIA) 10 MG tablet Take 1 tablet (10 mg total) by mouth daily.  Marland Kitchen glucose blood (FREESTYLE LITE) test strip TEST TWICE A DAY. (E11.9)  . multivitamin-iron-minerals-folic acid (CENTRUM) chewable tablet Chew 1 tablet by mouth daily.  Marland Kitchen PARoxetine (PAXIL) 40 MG tablet TAKE 1.5 TABLETS (60 MG TOTAL) BY MOUTH EVERY MORNING. (Patient taking differently: Take 20 mg by mouth every morning. )  . pravastatin (PRAVACHOL) 40 MG tablet Take 1 tablet (40 mg total) by mouth daily.  . QUEtiapine (SEROQUEL) 100 MG tablet Take 100 mg by mouth 2 (two) times daily.  . ranitidine (ZANTAC) 300 MG tablet Take 300 mg by mouth at bedtime.  . tolterodine (DETROL LA) 4 MG 24 hr capsule TAKE 1 CAPSULE (4 MG TOTAL) BY MOUTH DAILY.  Marland Kitchen umeclidinium  bromide (INCRUSE ELLIPTA) 62.5 MCG/INH AEPB Inhale 1 puff into the lungs daily.  Marland Kitchen zolpidem (AMBIEN) 10 MG tablet Take 10 mg by mouth at bedtime as needed for sleep.           Review of Systems     Objective:   Physical Exam amb wf nad  Wt Readings from Last 3 Encounters:  08/30/17 177 lb 12.8 oz (80.6 kg)  08/07/17 179 lb (81.2 kg)  06/08/17 187 lb 1.9 oz (84.9 kg)     Vital signs reviewed - Note on arrival 02 sats  94% on RA      HEENT: nl dentition / oropharynx. Nl external ear canals without cough reflex - mild  bilateral non-specific turbinate edema     NECK :  without  JVD/Nodes/TM/ nl carotid upstrokes bilaterally   LUNGS: no acc muscle use,  Mod barrel  contour chest wall with bilateral  Distant bs s audible wheeze and  without cough on insp or exp maneuver and mod   Hyperresonant  to  percussion bilaterally     CV:  RRR  no s3 or murmur or increase in P2, and no edema   ABD:  soft and nontender with pos mid insp Hoover's  in the supine position. No bruits or organomegaly appreciated, bowel sounds nl  MS:  Slow slt unsteady gait/  ext warm without deformities, calf tenderness, cyanosis or clubbing No obvious joint restrictions   SKIN: warm and dry without lesions    NEURO:  alert, approp, nl sensorium with  no motor or cerebellar deficits apparent.      CXR PA and Lateral:   08/30/2017 :    I personally reviewed images and   impression as follows:   Mild coarsening of int markings, old rib rx on R/ ? Eventration ant portion of R HD       Assessment:

## 2017-08-31 ENCOUNTER — Encounter: Payer: Self-pay | Admitting: Internal Medicine

## 2017-08-31 DIAGNOSIS — J84117 Desquamative interstitial pneumonia: Secondary | ICD-10-CM | POA: Insufficient documentation

## 2017-08-31 NOTE — Progress Notes (Signed)
Was able to talk to the patient regarding their results.  They verbalized an understanding of what was discussed. No further questions at this time. 

## 2017-08-31 NOTE — Assessment & Plan Note (Signed)
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 Might benefit from rehab in not already done in [place]   Total time devoted to counseling  > 50 % of initial 60 min office visit:  review case with pt/ discussion of options/alternatives/ personally creating written customized instructions  in presence of pt  then going over those specific  Instructions directly with the pt including how to use all of the meds but in particular covering each new medication in detail and the difference between the maintenance= "automatic" meds and the prns using an action plan format for the latter (If this problem/symptom => do that organization reading Left to right).  Please see AVS from this visit for a full list of these instructions which I personally wrote for this pt and  are unique to this visit.

## 2017-08-31 NOTE — Assessment & Plan Note (Signed)
Quit smoking 08/2016 and stopped incruse 2 weeks after that    I reviewed the Fletcher curve with the patient that basically indicates  if you quit smoking when your best day FEV1 is relatively  preserved (as is likely still   the case here)  it is highly unlikely you will progress to severe disease and informed the patient there was  no medication on the market that has proven to alter the curve/ its downward trajectory  or the likelihood of progression of their disease(unlike other chronic medical conditions such as atheroclerosis where we do think we can change the natural hx with risk reducing meds)    Therefore stopping smoking and maintaining abstinence are  the most important aspects of care, not choice of inhalers or for that matter, doctors.   Treatment other than smoking cessation  is entirely directed by severity of symptoms and focused also on reducing exacerbations, not attempting to change the natural history of the disease.    No limiting symptoms or flares of copd so no need for rx at this point

## 2017-09-18 ENCOUNTER — Other Ambulatory Visit: Payer: Self-pay | Admitting: Nurse Practitioner

## 2017-10-02 ENCOUNTER — Other Ambulatory Visit: Payer: Self-pay

## 2017-10-02 ENCOUNTER — Other Ambulatory Visit: Payer: Self-pay | Admitting: Nurse Practitioner

## 2017-10-02 MED ORDER — AMLODIPINE BESYLATE 5 MG PO TABS: 5.0000 mg | ORAL_TABLET | Freq: Every day | ORAL | 1 refills | Status: DC

## 2017-10-05 ENCOUNTER — Ambulatory Visit (INDEPENDENT_AMBULATORY_CARE_PROVIDER_SITE_OTHER): Payer: Medicare Other | Admitting: Internal Medicine

## 2017-10-05 ENCOUNTER — Encounter: Payer: Self-pay | Admitting: Internal Medicine

## 2017-10-05 VITALS — BP 130/68 | HR 85 | Ht 64.0 in | Wt 181.0 lb

## 2017-10-05 DIAGNOSIS — J449 Chronic obstructive pulmonary disease, unspecified: Secondary | ICD-10-CM | POA: Diagnosis not present

## 2017-10-05 DIAGNOSIS — J84117 Desquamative interstitial pneumonia: Secondary | ICD-10-CM

## 2017-10-05 LAB — PULMONARY FUNCTION TEST
DL/VA % PRED: 72 %
DL/VA: 3.48 ml/min/mmHg/L
DLCO unc % pred: 57 %
DLCO unc: 13.99 ml/min/mmHg
FEF 25-75 POST: 1.17 L/s
FEF 25-75 Pre: 0.97 L/sec
FEF2575-%CHANGE-POST: 21 %
FEF2575-%PRED-POST: 65 %
FEF2575-%PRED-PRE: 54 %
FEV1-%CHANGE-POST: 4 %
FEV1-%Pred-Post: 75 %
FEV1-%Pred-Pre: 72 %
FEV1-PRE: 1.59 L
FEV1-Post: 1.66 L
FEV1FVC-%CHANGE-POST: 7 %
FEV1FVC-%PRED-PRE: 94 %
FEV6-%Change-Post: -2 %
FEV6-%Pred-Post: 78 %
FEV6-%Pred-Pre: 81 %
FEV6-Post: 2.18 L
FEV6-Pre: 2.25 L
FEV6FVC-%Change-Post: 0 %
FEV6FVC-%Pred-Post: 105 %
FEV6FVC-%Pred-Pre: 105 %
FVC-%CHANGE-POST: -2 %
FVC-%PRED-POST: 75 %
FVC-%Pred-Pre: 77 %
FVC-Post: 2.18 L
FVC-Pre: 2.25 L
PRE FEV1/FVC RATIO: 71 %
Post FEV1/FVC ratio: 76 %
Post FEV6/FVC ratio: 100 %
Pre FEV6/FVC Ratio: 100 %
RV % pred: 143 %
RV: 3.23 L
TLC % PRED: 109 %
TLC: 5.54 L

## 2017-10-05 NOTE — Progress Notes (Signed)
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 Pulmonary Consult    Referred by Cheron Schaumann, NP. Pt c/o DOE x 10 yrs. She gets winded walking up an incline.   overall better breathing since stop smoking and now MMRC1 = can walk nl pace, flat grade, can't hurry or go uphills or steps s sob   No cough/ no noct symptoms / stopped 02 2 weeks prior to OV  And no worse  Stopped incruse also around 2 weeks prior to OV   And also no worse  rec Stay off incruse   . 10/05/2017  f/u ov/Wert re:  Chief Complaint  Patient presents with  . Follow-up    PFT's done. Breathing is doing well and she denies any co's.    Dyspnea:  Balance slows her down  Cough: none Sleep: 1 pillow SABA use:  none   No obvious day to day or daytime variability or assoc excess/ purulent sputum or mucus plugs or hemoptysis or cp or chest tightness, subjective wheeze or overt sinus or hb symptoms. No unusual exposure hx or h/o childhood pna/ asthma or knowledge of premature birth.  Sleeping ok flat without nocturnal  or early am exacerbation  of respiratory  c/o's or need for noct saba. Also denies any obvious fluctuation of symptoms with weather or environmental changes or other aggravating or alleviating factors except as outlined above   Current Allergies, Complete Past Medical History, Past Surgical History, Family History, and Social History were reviewed in [employer] record.  ROS  The following are not active complaints unless bolded Hoarseness, sore throat, dysphagia, dental  problems, itching, sneezing,  nasal congestion or discharge of excess mucus or purulent secretions, ear ache,   fever, chills, sweats, unintended wt loss or wt gain, classically pleuritic or exertional cp,  orthopnea pnd or leg swelling, presyncope, palpitations, abdominal pain, anorexia, nausea, vomiting, diarrhea  or change in bowel habits or change in bladder habits, change in stools or change in urine, dysuria, hematuria,  rash, arthralgias, visual complaints, headache, numbness, weakness or ataxia or problems with walking or coordination,  change in mood/affect or memory.        Current Meds  Medication Sig  . amLODipine (NORVASC) 5 MG tablet Take 1 tablet (5 mg total) by mouth daily.  Marland Kitchen aspirin EC 81 MG tablet Take 81 mg by mouth daily.  . Cholecalciferol (VITAMIN D3) 2000 units TABS Take by mouth.  . divalproex (DEPAKOTE) 500 MG DR tablet Take 500 mg by mouth 3 (three) times daily.  Marland Kitchen ezetimibe (ZETIA) 10 MG tablet Take 1 tablet (10 mg total) by mouth daily.  Marland Kitchen glucose blood (FREESTYLE LITE) test strip TEST TWICE A DAY. (E11.9)  . multivitamin-iron-minerals-folic acid (CENTRUM) chewable tablet Chew 1 tablet by mouth daily.  Marland Kitchen PARoxetine (PAXIL) 20 MG tablet Take 20 mg by mouth at bedtime.  . pravastatin (PRAVACHOL) 40 MG tablet Take 1 tablet (40 mg total) by mouth daily.  Marland Kitchen  QUEtiapine (SEROQUEL) 100 MG tablet Take 100 mg by mouth 2 (two) times daily.  . ranitidine (ZANTAC) 300 MG tablet Take 300 mg by mouth at bedtime.  . tolterodine (DETROL LA) 4 MG 24 hr capsule TAKE 1 CAPSULE (4 MG TOTAL) BY MOUTH DAILY.  Marland Kitchen zolpidem (AMBIEN) 10 MG tablet Take 10 mg by mouth at bedtime as needed for sleep.                Objective:   Physical Exam   amb pleasant wf nad   10/05/2017          181   08/30/17 177 lb 12.8 oz (80.6 kg)  08/07/17 179 lb (81.2 kg)  06/08/17 187 lb 1.9 oz (84.9 kg)      Vital signs reviewed - Note on arrival 02 sats  96% on RA         No jvd Oropharynx  clear Neck supple Lungs clear bilaterally to A and P  RRR no s3 or or sign murmur Abd soft/ non tender/ ok excursion on isp Ext wam with no edema or clubbing noted Neuro  Alert/ No motor deficits         Assessment:

## 2017-10-05 NOTE — Patient Instructions (Signed)
You do not have copd or much lung disease at all and if you stay off cigarettes you don't need to return here regular   If you are satisfied with your treatment plan,  let your doctor know and he/she can either refill your medications or you can return here when your prescription runs out.     If in any way you are not 100% satisfied,  please tell us.  If 100% better, tell your friends!  Pulmonary follow up is as needed

## 2017-10-05 NOTE — Progress Notes (Signed)
PFT completed 10/05/17

## 2017-10-08 ENCOUNTER — Encounter: Payer: Self-pay | Admitting: Internal Medicine

## 2017-10-08 NOTE — Assessment & Plan Note (Signed)
Quit smoking 08/2016 and stopped incruse 2 weeks after that - PFT's  10/05/2017  FEV1 1.66 (75 % ) ratio 76  p 4 % improvement from saba p nothing prior to study with DLCO  57 % corrects to 72 % for alv volume   ERV 24 %   I reviewed the Fletcher curve with the patient that basically indicates  if you quit smoking when your best day FEV1 is still well preserved (as is clearly  the case here)  it is highly unlikely you will progress to severe disease and informed the patient there was  no medication on the market that has proven to alter the curve/ its downward trajectory  or the likelihood of progression of their disease(unlike other chronic medical conditions such as atheroclerosis where we do think we can change the natural hx with risk reducing meds)    Therefore stopping smoking and maintaining abstinence are  the most important aspects of care, not choice of inhalers or for that matter, doctors.   Treatment other than smoking cessation  is entirely directed by severity of symptoms and focused also on reducing exacerbations, not attempting to change the natural history of the disease.   She has no limiting symptoms or tendency to aecopd so no rx or f/u indicated

## 2017-10-08 NOTE — Assessment & Plan Note (Addendum)
Dx in [place] around early 2000s while actively smoking > quit 08/2016  - 08/30/2017   Walked RA  2 laps @ 185 ft each stopped due to  Poor balance/ fatigue, no sob or desat - PFTs 10/05/2017  FVC  2.25 (77%) and dlco corrects to 79% > no f/u needed   No evidence of significant ILD s/p smoking cessation > reinforced

## 2017-10-30 DIAGNOSIS — Z79899 Other long term (current) drug therapy: Secondary | ICD-10-CM | POA: Diagnosis not present

## 2017-11-05 DIAGNOSIS — F3131 Bipolar disorder, current episode depressed, mild: Secondary | ICD-10-CM | POA: Diagnosis not present

## 2017-11-07 ENCOUNTER — Ambulatory Visit (INDEPENDENT_AMBULATORY_CARE_PROVIDER_SITE_OTHER): Payer: Medicare Other | Admitting: Nurse Practitioner

## 2017-11-07 ENCOUNTER — Other Ambulatory Visit (INDEPENDENT_AMBULATORY_CARE_PROVIDER_SITE_OTHER): Payer: Medicare Other

## 2017-11-07 ENCOUNTER — Encounter: Payer: Self-pay | Admitting: Nurse Practitioner

## 2017-11-07 VITALS — BP 120/62 | HR 79 | Temp 98.0°F | Resp 16 | Ht 64.0 in | Wt 180.0 lb

## 2017-11-07 DIAGNOSIS — I10 Essential (primary) hypertension: Secondary | ICD-10-CM

## 2017-11-07 DIAGNOSIS — E1121 Type 2 diabetes mellitus with diabetic nephropathy: Secondary | ICD-10-CM

## 2017-11-07 LAB — BASIC METABOLIC PANEL
BUN: 27 mg/dL — ABNORMAL HIGH (ref 6–23)
CALCIUM: 9.9 mg/dL (ref 8.4–10.5)
CO2: 27 meq/L (ref 19–32)
Chloride: 105 mEq/L (ref 96–112)
Creatinine, Ser: 1.15 mg/dL (ref 0.40–1.20)
GFR: 49.23 mL/min — ABNORMAL LOW (ref >60.00)
Glucose, Bld: 159 mg/dL — ABNORMAL HIGH (ref 70–99)
POTASSIUM: 4.2 meq/L (ref 3.5–5.1)
SODIUM: 141 meq/L (ref 135–145)

## 2017-11-07 LAB — HEMOGLOBIN A1C: HEMOGLOBIN A1C: 5.8 % (ref 4.6–6.5)

## 2017-11-07 NOTE — Progress Notes (Signed)
Name: Paula [name]   MRN: [id number]    DOB: 08-22-1945   Date:11/07/2017       Progress Note  Subjective  Chief Complaint  Chief Complaint  Patient presents with  . Follow-up    A1c and bp    HPI  Hypertension -maintained on amlodipine 5 daily Reports daily medication compliance without noted adverse medication effects. Reports recent readings at home 120s/70s Denies headaches, vision changes, chest pain, shortness of breath, edema.  BP Readings from Last 3 Encounters:  11/07/17 120/62  10/05/17 130/68  08/30/17 112/64   Diabetes- metformin was discontinued at last OV 08/07/17 as her A1c and home readings were stable and lab work was showing worsening kidney function. Reports she has been checking blood glucose readings daily at home with readings 90s-110s since stopping metformin in march. Denies tremor, diaphoresis, polyuria, polydipsia, polyphagia.  Lab Results  Component Value Date   HGBA1C 6.1 02/12/2017     Patient Active Problem List   Diagnosis Date Noted  . DIP (desquamative interstitial pneumonia) ([place]) 08/31/2017  . CKD (chronic kidney disease) stage 3, GFR 30-59 ml/min ([facility]) 08/07/2017  . Type 2 diabetes mellitus, controlled ([place]) 11/09/2016  . Essential hypertension 11/09/2016  . Mixed hyperlipidemia 11/09/2016  . Bipolar 2 disorder ([place]) 11/09/2016  . Overactive bladder 11/09/2016  . COPD  GOLD 0  11/09/2016    Past Surgical History:  Procedure Laterality Date  . APPENDECTOMY    . BREAST BIOPSY    . CHOLECYSTECTOMY    . LUNG BIOPSY    . SUBTOTAL COLECTOMY      Family History  Problem Relation Age of Onset  . Hyperlipidemia Mother   . COPD Mother        smoked  . Arthritis Father   . Heart disease Father   . Diabetes Father   . Heart disease Paternal Uncle   . Heart disease Paternal Grandmother     Social History   Socioeconomic History  . Marital status: Single    Spouse name: Not on file  . Number of children: 1  . Years of  education: 56  . Highest education level: Not on file  Occupational History  . Occupation: Retired Animal nutritionist  . Financial resource strain: Not on file  . Food insecurity:    Worry: Not on file    Inability: Not on file  . Transportation needs:    Medical: Not on file    Non-medical: Not on file  Tobacco Use  . Smoking status: Former Smoker    Packs/day: 1.00    Years: 55.00    Pack years: 55.00    Types: Cigarettes    Last attempt to quit: 08/03/2016    Years since quitting: 1.2  . Smokeless tobacco: Never Used  Substance and Sexual Activity  . Alcohol use: Yes    Alcohol/week: 2.4 - 3.6 oz    Types: 4 - 6 Cans of beer per week  . Drug use: Yes    Frequency: 7.0 times per week    Types: Marijuana  . Sexual activity: Not on file  Lifestyle  . Physical activity:    Days per week: Not on file    Minutes per session: Not on file  . Stress: Not on file  Relationships  . Social connections:    Talks on phone: Not on file    Gets together: Not on file    Attends religious service: Not on file    Active member of  club or organization: Not on file    Attends meetings of clubs or organizations: Not on file    Relationship status: Not on file  . Intimate partner violence:    Fear of current or ex partner: Not on file    Emotionally abused: Not on file    Physically abused: Not on file    Forced sexual activity: Not on file  Other Topics Concern  . Not on file  Social History Narrative   Fun/Hobby: Read   Denies abuse and feels safe at home      Current Outpatient Medications:  .  amLODipine (NORVASC) 5 MG tablet, Take 1 tablet (5 mg total) by mouth daily., Disp: 90 tablet, Rfl: 1 .  aspirin EC 81 MG tablet, Take 81 mg by mouth daily., Disp: , Rfl:  .  Cholecalciferol (VITAMIN D3) 2000 units TABS, Take by mouth., Disp: , Rfl:  .  divalproex (DEPAKOTE) 500 MG DR tablet, Take 250 mg by mouth 3 (three) times daily. , Disp: , Rfl:  .  ezetimibe (ZETIA) 10 MG tablet,  Take 1 tablet (10 mg total) by mouth daily., Disp: 90 tablet, Rfl: 1 .  glucose blood (FREESTYLE LITE) test strip, TEST TWICE A DAY. (E11.9), Disp: 100 each, Rfl: 12 .  multivitamin-iron-minerals-folic acid (CENTRUM) chewable tablet, Chew 1 tablet by mouth daily., Disp: , Rfl:  .  PARoxetine (PAXIL) 20 MG tablet, Take 10 mg by mouth at bedtime. , Disp: , Rfl: 4 .  pravastatin (PRAVACHOL) 40 MG tablet, Take 1 tablet (40 mg total) by mouth daily., Disp: 90 tablet, Rfl: 1 .  QUEtiapine (SEROQUEL) 100 MG tablet, Take 100 mg by mouth 2 (two) times daily., Disp: , Rfl: 3 .  ranitidine (ZANTAC) 300 MG tablet, Take 300 mg by mouth at bedtime., Disp: , Rfl:  .  tolterodine (DETROL LA) 4 MG 24 hr capsule, TAKE 1 CAPSULE (4 MG TOTAL) BY MOUTH DAILY., Disp: 90 capsule, Rfl: 1 .  zolpidem (AMBIEN) 10 MG tablet, Take 5 mg by mouth at bedtime as needed for sleep. , Disp: , Rfl:   Allergies  Allergen Reactions  . Penicillins      ROS See HPI  Objective  Vitals:   11/07/17 1107  BP: 120/62  Pulse: 79  Resp: 16  Temp: 98 F (36.7 C)  TempSrc: Oral  SpO2: 96%  Weight: 180 lb (81.6 kg)  Height: 5\' 4"  (1.626 m)    Body mass index is 30.9 kg/m.  Physical Exam Vital signs reviewed. Constitutional: Patient appears well-developed and well-nourished. No distress.  HENT: Head: Normocephalic and atraumatic. Nose: Nose normal. Mouth/Throat: Oropharynx is clear and moist.  Eyes: Conjunctivae and EOM are normal. No scleral icterus.  Neck: Normal range of motion. Neck supple.  Cardiovascular: Normal rate, regular rhythm and normal heart sounds.  No BLE edema. Distal pulses intact. Pulmonary/Chest: Effort normal and breath sounds normal. No respiratory distress. Neurological: She is alert and oriented to person, place, and time. Coordination, balance, strength, speech and gait are normal.  Skin: Skin is warm and dry. No rash noted. No erythema.  Psychiatric: Patient has a normal mood and affect.  behavior is normal. Judgment and thought content normal.  Assessment & Plan F/U TBD pending lab results today Recommended AWV with Sharee Pimple

## 2017-11-07 NOTE — Assessment & Plan Note (Signed)
Home glucose readings appear stable off metformin Update labs F/U with further recommendations pending lab results - Basic metabolic panel; Future - Hemoglobin A1c; Future

## 2017-11-07 NOTE — Patient Instructions (Addendum)
Please head downstairs for lab work/x-rays. If any of your test results are critically abnormal, you will be contacted right away. Your results may be released to your MyChart for viewing before I am able to provide you with my response. I will contact you within a week about your test results and any recommendations for abnormalities.  If you have medicare related insurance (such as traditional Medicare, [employer], [employer], or similar), Please make an appointment at the scheduling desk with Sharee Pimple, the [employer], for your Wellness visit in this office, which is a benefit with your insurance.   We will determine when you should come back and see me when I get your lab results back.

## 2017-11-07 NOTE — Assessment & Plan Note (Signed)
Stable on past several readings in the clinic Continue current meds F/U for readings >140/90

## 2017-11-13 ENCOUNTER — Telehealth: Payer: Self-pay | Admitting: Internal Medicine

## 2017-11-13 ENCOUNTER — Telehealth: Payer: Self-pay | Admitting: Nurse Practitioner

## 2017-11-13 DIAGNOSIS — J449 Chronic obstructive pulmonary disease, unspecified: Secondary | ICD-10-CM

## 2017-11-13 NOTE — Telephone Encounter (Signed)
Fine with me

## 2017-11-13 NOTE — Telephone Encounter (Signed)
Order placed to [facility] to discontinue oxygen. Nothing further is needed.

## 2017-11-13 NOTE — Telephone Encounter (Signed)
Pt advised as well.  

## 2017-11-13 NOTE — Telephone Encounter (Unsigned)
Copied from [place] [id number]. Topic: Quick Communication - Lab Results >> Nov 13, 2017  3:05 PM Lorrin Jackson, CMA wrote: Called patient to inform them of 11/07/17 lab results. When patient returns call, triage nurse may disclose results.

## 2017-11-13 NOTE — Telephone Encounter (Signed)
Called patient-states she wants her O2 picked up as she feels she no longer needs it. MW please advise if you are okay with patient having O2 picked up. Thanks  Instructions   You do not have copd or much lung disease at all and if you stay off cigarettes you don't need to return here regular   If you are satisfied with your treatment plan,  let your doctor know and he/she can either refill your medications or you can return here when your prescription runs out.     If in any way you are not 100% satisfied,  please tell us.  If 100% better, tell your friends!

## 2017-11-14 NOTE — Telephone Encounter (Signed)
See result note.  

## 2017-11-18 ENCOUNTER — Other Ambulatory Visit: Payer: Self-pay | Admitting: Family

## 2017-11-25 ENCOUNTER — Other Ambulatory Visit: Payer: Self-pay | Admitting: Family

## 2017-11-27 ENCOUNTER — Other Ambulatory Visit: Payer: Self-pay

## 2017-11-27 MED ORDER — TOLTERODINE TARTRATE ER 4 MG PO CP24: 4.0000 mg | ORAL_CAPSULE | Freq: Every day | ORAL | 1 refills | Status: DC

## 2018-01-03 ENCOUNTER — Ambulatory Visit (INDEPENDENT_AMBULATORY_CARE_PROVIDER_SITE_OTHER): Payer: Medicare Other | Admitting: Nurse Practitioner

## 2018-01-03 ENCOUNTER — Encounter: Payer: Self-pay | Admitting: Nurse Practitioner

## 2018-01-03 VITALS — BP 112/60 | HR 93 | Temp 98.5°F | Resp 16 | Ht 64.0 in | Wt 181.0 lb

## 2018-01-03 DIAGNOSIS — M62838 Other muscle spasm: Secondary | ICD-10-CM | POA: Diagnosis not present

## 2018-01-03 DIAGNOSIS — L989 Disorder of the skin and subcutaneous tissue, unspecified: Secondary | ICD-10-CM

## 2018-01-03 DIAGNOSIS — H1012 Acute atopic conjunctivitis, left eye: Secondary | ICD-10-CM

## 2018-01-03 DIAGNOSIS — F3131 Bipolar disorder, current episode depressed, mild: Secondary | ICD-10-CM | POA: Diagnosis not present

## 2018-01-03 MED ORDER — OLOPATADINE HCL 0.1 % OP SOLN: 1.0000 [drp] | Freq: Two times a day (BID) | OPHTHALMIC | 1 refills | Status: DC

## 2018-01-03 MED ORDER — TIZANIDINE HCL 2 MG PO CAPS: 2.0000 mg | ORAL_CAPSULE | Freq: Three times a day (TID) | ORAL | 0 refills | Status: DC

## 2018-01-03 MED ORDER — TRIAMCINOLONE ACETONIDE 0.1 % EX CREA: 1.0000 "application " | TOPICAL_CREAM | Freq: Two times a day (BID) | CUTANEOUS | 0 refills | Status: DC

## 2018-01-03 NOTE — Progress Notes (Signed)
Name: Linzey Ramser   MRN: [id number]    DOB: May 27, 1946   Date:01/03/2018       Progress Note  Subjective  Chief Complaint  Chief Complaint  Patient presents with  . Neck Pain    neck and shoulder pain x1 week, goes up the back of neck    HPI  Ms Lisa is here today for evaluation of several acute complaints: neck pain, eye problem, skin problem.  Neck pain- This is a new problem She describes as tight pain in her the right side of her neck radiating into right shoulder, first noticed when waking from sleep about 1 week ago and has been constant since She does not recall any injury, heavy lifting or exertion prior to onset. The pain is worse with movement of head or arm. She has tried ibuprofen without relief She denies fevers, headaches, weakness, syncope, numbness, tingling, chest pain, shortness of breath.  Eye problem- This is not a new problem She reports intermittent watering, clear drainage, itching and redness to left eye for months now. She denies pain, vision changes, purulent exudate   Skin problem- This is a new problem She c/o dry scaly patch of skin to right anterior lower leg first noticed several weeks ago She describes as itchy She denies pain, drainage, bleeding.   Patient Active Problem List   Diagnosis Date Noted  . DIP (desquamative interstitial pneumonia) ([place]) 08/31/2017  . CKD (chronic kidney disease) stage 3, GFR 30-59 ml/min ([facility]) 08/07/2017  . Type 2 diabetes mellitus, controlled ([place]) 11/09/2016  . Essential hypertension 11/09/2016  . Mixed hyperlipidemia 11/09/2016  . Bipolar 2 disorder ([place]) 11/09/2016  . Overactive bladder 11/09/2016  . COPD  GOLD 0  11/09/2016    Social History   Tobacco Use  . Smoking status: Former Smoker    Packs/day: 1.00    Years: 55.00    Pack years: 55.00    Types: Cigarettes    Last attempt to quit: 08/03/2016    Years since quitting: 1.4  . Smokeless tobacco: Never Used  Substance Use Topics  . Alcohol  use: Yes    Alcohol/week: 2.4 - 3.6 oz    Types: 4 - 6 Cans of beer per week     Current Outpatient Medications:  .  amLODipine (NORVASC) 5 MG tablet, Take 1 tablet (5 mg total) by mouth daily., Disp: 90 tablet, Rfl: 1 .  aspirin EC 81 MG tablet, Take 81 mg by mouth daily., Disp: , Rfl:  .  Cholecalciferol (VITAMIN D3) 2000 units TABS, Take by mouth., Disp: , Rfl:  .  divalproex (DEPAKOTE) 500 MG DR tablet, Take 250 mg by mouth 3 (three) times daily. , Disp: , Rfl:  .  ezetimibe (ZETIA) 10 MG tablet, Take 1 tablet (10 mg total) by mouth daily., Disp: 90 tablet, Rfl: 1 .  glucose blood (FREESTYLE LITE) test strip, TEST TWICE A DAY. (E11.9), Disp: 100 each, Rfl: 12 .  multivitamin-iron-minerals-folic acid (CENTRUM) chewable tablet, Chew 1 tablet by mouth daily., Disp: , Rfl:  .  pravastatin (PRAVACHOL) 40 MG tablet, Take 1 tablet (40 mg total) by mouth daily., Disp: 90 tablet, Rfl: 1 .  QUEtiapine (SEROQUEL) 100 MG tablet, Take 100 mg by mouth 2 (two) times daily., Disp: , Rfl: 3 .  ranitidine (ZANTAC) 300 MG tablet, Take 300 mg by mouth at bedtime., Disp: , Rfl:  .  tolterodine (DETROL LA) 4 MG 24 hr capsule, Take 1 capsule (4 mg total) by mouth daily., Disp:  90 capsule, Rfl: 1 .  zolpidem (AMBIEN) 10 MG tablet, Take 5 mg by mouth at bedtime as needed for sleep. , Disp: , Rfl:   Allergies  Allergen Reactions  . Penicillins     ROS  No other specific complaints in a complete review of systems (except as listed in HPI above).  Objective  Vitals:   01/03/18 1334  BP: 112/60  Pulse: 93  Resp: 16  Temp: 98.5 F (36.9 C)  TempSrc: Oral  SpO2: 96%  Weight: 181 lb (82.1 kg)  Height: 5\' 4"  (1.626 m)    Body mass index is 31.07 kg/m.  Nursing Note and Vital Signs reviewed.  Physical Exam  Constitutional: Patient appears well-developed and well-nourished.  No distress.  HEENT: head atraumatic, normocephalic, pupils equal and reactive to light, EOM's intact, left eye: mildly  injected conjunctiva, mildly erythematous lower eyelid, and clear watery drainage; neck supple, oropharynx pink and moist without exudate Cardiovascular: Normal rate, regular rhythm, S1/S2 present. Distal pulses intact Pulmonary/Chest: Effort normal and breath sounds clear. No respiratory distress or retractions. Musculoskeletal: Normal range of motion, No gross deformities. Neurological: She is alert and oriented to person, place, and time. No cranial nerve deficit. Coordination, balance, strength, speech and gait are normal.  Skin: Skin is warm and dry. Approx 1" round dry scaly erythematous plaque noted to right anterior lower extremity. Psychiatric: Patient has a normal mood and affect. behavior is normal. Judgment and thought content normal.   Assessment & Plan  1. Allergic conjunctivitis of left eye Will treat with course of patanol for symptoms of allergic conjunctivitis, dosing and side effects discussed F/U for new, worsening symptoms or if no improvement - olopatadine (PATANOL) 0.1 % ophthalmic solution; Place 1 drop into the left eye 2 (two) times daily.  Dispense: 5 mL; Refill: 1  2. Muscle spasm Suspect muscle spasm as source of pain Will rx course of muscle relaxer-dosing and side effects discussed Discussed home management of neck pain including return precautions and neck exercises- additional information provided on AVS F/U with sports medicine if worsening or no improvement in 1 week - tizanidine (ZANAFLEX) 2 MG capsule; Take 1 capsule (2 mg total) by mouth 3 (three) times daily.  Dispense: 30 capsule; Refill: 0  3. Skin problem Will treat with course of topical steroid dosing and side effects discussed  F/U for new, worsening symptoms or if no improvement - triamcinolone cream (KENALOG) 0.1 %; Apply 1 application topically 2 (two) times daily.  Dispense: 30 g; Refill: 0

## 2018-01-03 NOTE — Patient Instructions (Addendum)
You may try zanaflex 2mg  three times daily for neck pain. zanaflex can cause drowsiness. Please do not drink alcohol or operate machinery when you take this medication.  For your eye itching and watering-pataday drops twice daily   For your dry skin, kenalog cream twice daily  Please schedule a follow up appointment for further evaluation here with Dr Tamala Julian or Dr Raeford Razor, our sports medicine providers if your neck pain is not better in 1 week  Please come back to see me if your eye and skin do not get better with treatments prescribed today    Neck Exercises Neck exercises can be important for many reasons:  They can help you to improve and maintain flexibility in your neck. This can be especially important as you age.  They can help to make your neck stronger. This can make movement easier.  They can reduce or prevent neck pain.  They may help your upper back.  Ask your health care provider which neck exercises would be best for you. Exercises Neck Press Repeat this exercise 10 times. Do it first thing in the morning and right before bed or as told by your health care provider. 1. Lie on your back on a firm bed or on the floor with a pillow under your head. 2. Use your neck muscles to push your head down on the pillow and straighten your spine. 3. Hold the position as well as you can. Keep your head facing up and your chin tucked. 4. Slowly count to 5 while holding this position. 5. Relax for a few seconds. Then repeat.  Isometric Strengthening Do a full set of these exercises 2 times a day or as told by your health care provider. 1. Sit in a supportive chair and place your hand on your forehead. 2. Push forward with your head and neck while pushing back with your hand. Hold for 10 seconds. 3. Relax. Then repeat the exercise 3 times. 4. Next, do thesequence again, this time putting your hand against the back of your head. Use your head and neck to push backward against the  hand pressure. 5. Finally, do the same exercise on either side of your head, pushing sideways against the pressure of your hand.  Prone Head Lifts Repeat this exercise 5 times. Do this 2 times a day or as told by your health care provider. 1. Lie face-down, resting on your elbows so that your chest and upper back are raised. 2. Start with your head facing downward, near your chest. Position your chin either on or near your chest. 3. Slowly lift your head upward. Lift until you are looking straight ahead. Then continue lifting your head as far back as you can stretch. 4. Hold your head up for 5 seconds. Then slowly lower it to your starting position.  Supine Head Lifts Repeat this exercise 8-10 times. Do this 2 times a day or as told by your health care provider. 1. Lie on your back, bending your knees to point to the ceiling and keeping your feet flat on the floor. 2. Lift your head slowly off the floor, raising your chin toward your chest. 3. Hold for 5 seconds. 4. Relax and repeat.  Scapular Retraction Repeat this exercise 5 times. Do this 2 times a day or as told by your health care provider. 1. Stand with your arms at your sides. Look straight ahead. 2. Slowly pull both shoulders backward and downward until you feel a stretch between your shoulder blades  in your upper back. 3. Hold for 10-30 seconds. 4. Relax and repeat.  Contact a health care provider if:  Your neck pain or discomfort gets much worse when you do an exercise.  Your neck pain or discomfort does not improve within 2 hours after you exercise. If you have any of these problems, stop exercising right away. Do not do the exercises again unless your health care provider says that you can. Get help right away if:  You develop sudden, severe neck pain. If this happens, stop exercising right away. Do not do the exercises again unless your health care provider says that you can. Exercises Neck Stretch  Repeat this  exercise 3-5 times. 1. Do this exercise while standing or while sitting in a chair. 2. Place your feet flat on the floor, shoulder-width apart. 3. Slowly turn your head to the right. Turn it all the way to the right so you can look over your right shoulder. Do not tilt or tip your head. 4. Hold this position for 10-30 seconds. 5. Slowly turn your head to the left, to look over your left shoulder. 6. Hold this position for 10-30 seconds.  Neck Retraction Repeat this exercise 8-10 times. Do this 3-4 times a day or as told by your health care provider. 1. Do this exercise while standing or while sitting in a sturdy chair. 2. Look straight ahead. Do not bend your neck. 3. Use your fingers to push your chin backward. Do not bend your neck for this movement. Continue to face straight ahead. If you are doing the exercise properly, you will feel a slight sensation in your throat and a stretch at the back of your neck. 4. Hold the stretch for 1-2 seconds. Relax and repeat.  This information is not intended to replace advice given to you by your health care provider. Make sure you discuss any questions you have with your health care provider. Document Released: 05/03/2015 Document Revised: 10/28/2015 Document Reviewed: 11/30/2014 Elsevier Interactive Patient Education  [employer].

## 2018-01-05 ENCOUNTER — Other Ambulatory Visit: Payer: Self-pay | Admitting: Nurse Practitioner

## 2018-01-05 DIAGNOSIS — H1012 Acute atopic conjunctivitis, left eye: Secondary | ICD-10-CM

## 2018-02-02 ENCOUNTER — Other Ambulatory Visit: Payer: Self-pay | Admitting: Nurse Practitioner

## 2018-02-02 DIAGNOSIS — E782 Mixed hyperlipidemia: Secondary | ICD-10-CM

## 2018-02-07 ENCOUNTER — Other Ambulatory Visit: Payer: Self-pay | Admitting: Nurse Practitioner

## 2018-02-07 DIAGNOSIS — E782 Mixed hyperlipidemia: Secondary | ICD-10-CM

## 2018-03-01 ENCOUNTER — Other Ambulatory Visit: Payer: Self-pay | Admitting: Family

## 2018-03-01 ENCOUNTER — Other Ambulatory Visit: Payer: Self-pay | Admitting: Nurse Practitioner

## 2018-03-01 DIAGNOSIS — Z1231 Encounter for screening mammogram for malignant neoplasm of breast: Secondary | ICD-10-CM

## 2018-03-05 DIAGNOSIS — F3131 Bipolar disorder, current episode depressed, mild: Secondary | ICD-10-CM | POA: Diagnosis not present

## 2018-03-13 ENCOUNTER — Other Ambulatory Visit (INDEPENDENT_AMBULATORY_CARE_PROVIDER_SITE_OTHER): Payer: Medicare Other

## 2018-03-13 ENCOUNTER — Encounter: Payer: Self-pay | Admitting: Nurse Practitioner

## 2018-03-13 ENCOUNTER — Ambulatory Visit (INDEPENDENT_AMBULATORY_CARE_PROVIDER_SITE_OTHER): Payer: Medicare Other | Admitting: Nurse Practitioner

## 2018-03-13 VITALS — BP 120/72 | HR 76 | Ht 64.0 in | Wt 180.0 lb

## 2018-03-13 DIAGNOSIS — I10 Essential (primary) hypertension: Secondary | ICD-10-CM

## 2018-03-13 DIAGNOSIS — N183 Chronic kidney disease, stage 3 unspecified: Secondary | ICD-10-CM

## 2018-03-13 DIAGNOSIS — Z23 Encounter for immunization: Secondary | ICD-10-CM

## 2018-03-13 DIAGNOSIS — Z9189 Other specified personal risk factors, not elsewhere classified: Principal | ICD-10-CM

## 2018-03-13 DIAGNOSIS — Z1159 Encounter for screening for other viral diseases: Secondary | ICD-10-CM

## 2018-03-13 DIAGNOSIS — R7303 Prediabetes: Secondary | ICD-10-CM | POA: Diagnosis not present

## 2018-03-13 LAB — MICROALBUMIN / CREATININE URINE RATIO
CREATININE, U: 103.5 mg/dL
Microalb Creat Ratio: 1.3 mg/g (ref 0.0–30.0)
Microalb, Ur: 1.3 mg/dL (ref 0.0–1.9)

## 2018-03-13 LAB — BASIC METABOLIC PANEL
BUN: 24 mg/dL — AB (ref 6–23)
CALCIUM: 10.3 mg/dL (ref 8.4–10.5)
CHLORIDE: 107 meq/L (ref 96–112)
CO2: 29 meq/L (ref 19–32)
CREATININE: 1.21 mg/dL — AB (ref 0.40–1.20)
GFR: 46.38 mL/min — ABNORMAL LOW (ref >60.00)
Glucose, Bld: 91 mg/dL (ref 70–99)
Potassium: 4.5 mEq/L (ref 3.5–5.1)
Sodium: 141 mEq/L (ref 135–145)

## 2018-03-13 LAB — HEMOGLOBIN A1C: HEMOGLOBIN A1C: 5.8 % (ref 4.6–6.5)

## 2018-03-13 NOTE — Assessment & Plan Note (Signed)
Update labs F/U with further recommendations pending lab results - Basic metabolic panel; Future

## 2018-03-13 NOTE — Progress Notes (Signed)
Paula [name] is a 72 y.o. female with the following history as recorded in EpicCare:  Patient Active Problem List   Diagnosis Date Noted  . 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 CKD (chronic kidney disease) stage 3, GFR 30-59 ml/min (H[facility]) 08/07/2017  . Type 2 diabetes mellitus, controlled ([place]) 11/09/2016  . Essential hypertension 11/09/2016  . Mixed hyperlipidemia 11/09/2016  . Bipolar 2 disorder ([place]) 11/09/2016  . Overactive bladder 11/09/2016  . COPD  GOLD 0  11/09/2016    Current Outpatient Medications  Medication Sig Dispense Refill  . amLODipine (NORVASC) 5 MG tablet Take 1 tablet (5 mg total) by mouth daily. 90 tablet 1  . aspirin EC 81 MG tablet Take 81 mg by mouth daily.    . Cholecalciferol (VITAMIN D3) 2000 units TABS Take by mouth.    . divalproex (DEPAKOTE ER) 250 MG 24 hr tablet Take 750 mg by mouth every morning.  4  . divalproex (DEPAKOTE) 500 MG DR tablet Take 250 mg by mouth 3 (three) times daily.     Marland Kitchen ezetimibe (ZETIA) 10 MG tablet TAKE 1 TABLET BY MOUTH EVERY DAY 90 tablet 1  . FLUoxetine (PROZAC) 10 MG capsule Take 10 mg by mouth every morning.  2  . glucose blood (FREESTYLE LITE) test strip TEST TWICE A DAY. (E11.9) 100 each 12  . multivitamin-iron-minerals-folic acid (CENTRUM) chewable tablet Chew 1 tablet by mouth daily.    . Omega-3 Fatty Acids (FISH OIL) 1000 MG CAPS Take 2 capsules by mouth daily.    . pravastatin (PRAVACHOL) 40 MG tablet TAKE 1 TABLET BY MOUTH EVERY DAY 90 tablet 1  . QUEtiapine (SEROQUEL) 100 MG tablet Take 100 mg by mouth daily.   3  . QUEtiapine (SEROQUEL) 50 MG tablet Take 1 tablet by mouth daily.  3  . ranitidine (ZANTAC) 300 MG tablet Take 300 mg by mouth at bedtime.    . tolterodine (DETROL LA) 4 MG 24 hr capsule Take 1 capsule (4 mg total) by mouth daily. 90 capsule 1  . zolpidem (AMBIEN) 10 MG tablet Take 5 mg by mouth at bedtime as needed for sleep.     Marland Kitchen Olopatadine HCl (PAZEO) 0.7 % SOLN Place 1 drop  into both eyes daily. (Patient not taking: Reported on 03/13/2018) 1 mL 0  . tizanidine (ZANAFLEX) 2 MG capsule Take 1 capsule (2 mg total) by mouth 3 (three) times daily. (Patient not taking: Reported on 03/13/2018) 30 capsule 0  . triamcinolone cream (KENALOG) 0.1 % Apply 1 application topically 2 (two) times daily. (Patient not taking: Reported on 03/13/2018) 30 g 0   No current facility-administered medications for this visit.     Allergies: Penicillins  Past Medical History:  Diagnosis Date  . Allergy   . Arthritis   . Asthma   . Bipolar 1 disorder ([place])   . Colon polyps   . Depression   . Diabetes mellitus without complication ([place])   . GERD (gastroesophageal reflux disease)   . History of alcohol abuse   . History of chicken pox   . History of drug abuse ([place])   . Hyperlipidemia   . Hypertension   . Urine incontinence     Past Surgical History:  Procedure Laterality Date  . APPENDECTOMY    . BREAST BIOPSY    . CHOLECYSTECTOMY    . LUNG BIOPSY    . SUBTOTAL COLECTOMY      Family History  Problem Relation Age of Onset  . Hyperlipidemia  Mother   . COPD Mother        smoked  . Arthritis Father   . Heart disease Father   . Diabetes Father   . Heart disease Paternal Uncle   . Heart disease Paternal Grandmother     Social History   Tobacco Use  . Smoking status: Former Smoker    Packs/day: 1.00    Years: 55.00    Pack years: 55.00    Types: Cigarettes    Last attempt to quit: 08/03/2016    Years since quitting: 1.6  . Smokeless tobacco: Never Used  Substance Use Topics  . Alcohol use: Yes    Alcohol/week: 4.0 - 6.0 standard drinks    Types: 4 - 6 Cans of beer per week     Subjective:  Paula [name] is here today for follow up of HTN, pre-diabetes, taken off metformin in March and has been working on [employer]. She does still check her sugars occasionally, noticed some increase in glucose readings since starting prozac by psychiatry in September, some readings up  to 150 but mostly around 100 [place] tremor, diaphoresis, polyuria, polydipsia, polyphagia.  Lab Results  Component Value Date   HGBA1C 5.8 11/07/2017   For HTN, she is maintained on amlodipine 5. Reports daily medication compliance without noted adverse medication effects. Reports checking daily BP readings at home, recent readings 120s-130s/-70s Denies headaches, vision changes, chest pain, shortness of breath, edema.  BP Readings from Last 3 Encounters:  03/13/18 120/72  01/03/18 112/60  11/07/17 120/62   ROS- See HPI  Objective:  Vitals:   03/13/18 1325  BP: 120/72  Pulse: 76  SpO2: 98%  Weight: 180 lb (81.6 kg)  Height: 5\' 4"  (1.626 m)    General: Well developed, well nourished, in no acute distress  Skin : Warm and dry.  Head: Normocephalic and atraumatic  Eyes: Sclera and conjunctiva clear; pupils round and reactive to light; extraocular movements intact   Oropharynx: Pink, supple.  Neck: Supple without thyromegaly Lungs: Respirations unlabored; clear to auscultation bilaterally without wheeze, rales, rhonchi  CVS exam: normal rate, regular rhythm, normal S1, S2, distal pulses intact Extremities: No edema, cyanosis, clubbing  Neurologic: Alert and oriented; speech intact; face symmetrical; moves all extremities well; CNII-XII intact without focal deficit   Assessment:  1. Pre-diabetes   2. Need for influenza vaccination   3. Essential hypertension   4. Controlled type 2 diabetes mellitus without complication, without long-term current use of insulin ([place])   5. CKD (chronic kidney disease) stage 3, GFR 30-59 ml/min (HCC)   6. Encounter for hepatitis C virus screening test for high risk patient     Plan:   Return in about 6 months (around 09/12/2018) for CPE.  Orders Placed This Encounter  Procedures  . Flu vaccine HIGH DOSE PF  . Hemoglobin A1c    Standing Status:   Future    Standing Expiration Date:   03/14/2019  . Basic metabolic panel    Standing Status:    Future    Standing Expiration Date:   03/14/2019  . Microalbumin / creatinine urine ratio    Standing Status:   Future    Standing Expiration Date:   03/14/2019  . Hepatitis C antibody    Standing Status:   Future    Standing Expiration Date:   04/13/2018    Requested Prescriptions    No prescriptions requested or ordered in this encounter    Reviewed HM: Need for influenza vaccination- Flu  vaccine HIGH DOSE PF Encounter for hepatitis C virus screening test for high risk patient- Hepatitis C antibody; Future

## 2018-03-13 NOTE — Assessment & Plan Note (Signed)
Stable Continue current medication Continue to monitor - Basic metabolic panel; Future

## 2018-03-13 NOTE — Assessment & Plan Note (Signed)
Update labs F/U with further recommendations pending lab results additional education provided on AVS - Hemoglobin A1c; Future - Basic metabolic panel; Future - Microalbumin / creatinine urine ratio; Future

## 2018-03-13 NOTE — Patient Instructions (Signed)
Please head downstairs for lab work.. If any of your test results are critically abnormal, you will be contacted right away. Otherwise, I will contact you as soon as possible with any changes or recommendations.   Diabetes Mellitus and Nutrition When you have diabetes (diabetes mellitus), it is very important to have healthy eating habits because your blood sugar (glucose) levels are greatly affected by what you eat and drink. Eating healthy foods in the appropriate amounts, at about the same times every day, can help you:  Control your blood glucose.  Lower your risk of heart disease.  Improve your blood pressure.  Reach or maintain a healthy weight.  Every person with diabetes is different, and each person has different needs for a meal plan. Your health care provider may recommend that you work with a diet and nutrition specialist (dietitian) to make a meal plan that is best for you. Your meal plan may vary depending on factors such as:  The calories you need.  The medicines you take.  Your weight.  Your blood glucose, blood pressure, and cholesterol levels.  Your activity level.  Other health conditions you have, such as heart or kidney disease.  How do carbohydrates affect me? Carbohydrates affect your blood glucose level more than any other type of food. Eating carbohydrates naturally increases the amount of glucose in your blood. Carbohydrate counting is a method for keeping track of how many carbohydrates you eat. Counting carbohydrates is important to keep your blood glucose at a healthy level, especially if you use insulin or take certain oral diabetes medicines. It is important to know how many carbohydrates you can safely have in each meal. This is different for every person. Your dietitian can help you calculate how many carbohydrates you should have at each meal and for snack. Foods that contain carbohydrates include:  Bread, cereal, rice, pasta, and  crackers.  Potatoes and corn.  Peas, beans, and lentils.  Milk and yogurt.  Fruit and juice.  Desserts, such as cakes, cookies, ice cream, and candy.  How does alcohol affect me? Alcohol can cause a sudden decrease in blood glucose (hypoglycemia), especially if you use insulin or take certain oral diabetes medicines. Hypoglycemia can be a life-threatening condition. Symptoms of hypoglycemia (sleepiness, dizziness, and confusion) are similar to symptoms of having too much alcohol. If your health care provider says that alcohol is safe for you, follow these guidelines:  Limit alcohol intake to no more than 1 drink per day for nonpregnant women and 2 drinks per day for men. One drink equals 12 oz of beer, 5 oz of wine, or 1 oz of hard liquor.  Do not drink on an empty stomach.  Keep yourself hydrated with water, diet soda, or unsweetened iced tea.  Keep in mind that regular soda, juice, and other mixers may contain a lot of sugar and must be counted as carbohydrates.  What are tips for following this plan? Reading food labels  Start by checking the serving size on the label. The amount of calories, carbohydrates, fats, and other nutrients listed on the label are based on one serving of the food. Many foods contain more than one serving per package.  Check the total grams (g) of carbohydrates in one serving. You can calculate the number of servings of carbohydrates in one serving by dividing the total carbohydrates by 15. For example, if a food has 30 g of total carbohydrates, it would be equal to 2 servings of carbohydrates.  Check the  number of grams (g) of saturated and trans fats in one serving. Choose foods that have low or no amount of these fats.  Check the number of milligrams (mg) of sodium in one serving. Most people should limit total sodium intake to less than 2,300 mg per day.  Always check the nutrition information of foods labeled as "low-fat" or "nonfat". These foods  may be higher in added sugar or refined carbohydrates and should be avoided.  Talk to your dietitian to identify your daily goals for nutrients listed on the label. Shopping  Avoid buying canned, premade, or processed foods. These foods tend to be high in fat, sodium, and added sugar.  Shop around the outside edge of the grocery store. This includes fresh fruits and vegetables, bulk grains, fresh meats, and fresh dairy. Cooking  Use low-heat cooking methods, such as baking, instead of high-heat cooking methods like deep frying.  Cook using healthy oils, such as olive, canola, or sunflower oil.  Avoid cooking with butter, cream, or high-fat meats. Meal planning  Eat meals and snacks regularly, preferably at the same times every day. Avoid going long periods of time without eating.  Eat foods high in fiber, such as fresh fruits, vegetables, beans, and whole grains. Talk to your dietitian about how many servings of carbohydrates you can eat at each meal.  Eat 4-6 ounces of lean protein each day, such as lean meat, chicken, fish, eggs, or tofu. 1 ounce is equal to 1 ounce of meat, chicken, or fish, 1 egg, or 1/4 cup of tofu.  Eat some foods each day that contain healthy fats, such as avocado, nuts, seeds, and fish. Lifestyle   Check your blood glucose regularly.  Exercise at least 30 minutes 5 or more days each week, or as told by your health care provider.  Take medicines as told by your health care provider.  Do not use any products that contain nicotine or tobacco, such as cigarettes and e-cigarettes. If you need help quitting, ask your health care provider.  Work with a Social worker or diabetes educator to identify strategies to manage stress and any emotional and social challenges. What are some questions to ask my health care provider?  Do I need to meet with a diabetes educator?  Do I need to meet with a dietitian?  What number can I call if I have questions?  When are the  best times to check my blood glucose? Where to find more information:  American Diabetes Association: diabetes.org/food-and-fitness/food  Academy of Nutrition and Dietetics: PokerClues.dk  [employer] of Diabetes and Digestive and Kidney Diseases (NIH): ContactWire.be Summary  A healthy meal plan will help you control your blood glucose and maintain a healthy lifestyle.  Working with a diet and nutrition specialist (dietitian) can help you make a meal plan that is best for you.  Keep in mind that carbohydrates and alcohol have immediate effects on your blood glucose levels. It is important to count carbohydrates and to use alcohol carefully. This information is not intended to replace advice given to you by your health care provider. Make sure you discuss any questions you have with your health care provider. Document Released: 02/16/2005 Document Revised: 06/26/2016 Document Reviewed: 06/26/2016 Elsevier Interactive Patient Education  [employer].

## 2018-03-14 LAB — HEPATITIS C ANTIBODY
HEP C AB: NONREACTIVE
SIGNAL TO CUT-OFF: 0.02 (ref <1.00)

## 2018-03-29 ENCOUNTER — Other Ambulatory Visit: Payer: Self-pay | Admitting: Nurse Practitioner

## 2018-04-01 ENCOUNTER — Ambulatory Visit
Admission: RE | Admit: 2018-04-01 | Discharge: 2018-04-01 | Disposition: A | Discharge status: Home / Self Care | Payer: Medicare Other | Source: Ambulatory Visit | Attending: Nurse Practitioner | Admitting: Nurse Practitioner

## 2018-04-01 DIAGNOSIS — Z1231 Encounter for screening mammogram for malignant neoplasm of breast: Secondary | ICD-10-CM

## 2018-04-23 DIAGNOSIS — F3131 Bipolar disorder, current episode depressed, mild: Secondary | ICD-10-CM | POA: Diagnosis not present

## 2018-05-20 ENCOUNTER — Other Ambulatory Visit: Payer: Self-pay | Admitting: Nurse Practitioner

## 2018-07-02 DIAGNOSIS — F3131 Bipolar disorder, current episode depressed, mild: Secondary | ICD-10-CM | POA: Diagnosis not present

## 2018-08-02 ENCOUNTER — Other Ambulatory Visit: Payer: Self-pay | Admitting: *Deleted

## 2018-08-02 DIAGNOSIS — E782 Mixed hyperlipidemia: Secondary | ICD-10-CM

## 2018-08-02 MED ORDER — PRAVASTATIN SODIUM 40 MG PO TABS: 40.0000 mg | ORAL_TABLET | Freq: Every day | ORAL | 0 refills | Status: DC

## 2018-08-21 ENCOUNTER — Other Ambulatory Visit: Payer: Self-pay | Admitting: *Deleted

## 2018-08-21 MED ORDER — GLUCOSE BLOOD VI STRP: ORAL_STRIP | 12 refills | Status: DC

## 2018-09-12 ENCOUNTER — Ambulatory Visit: Payer: Medicare Other | Admitting: Nurse Practitioner

## 2018-09-19 ENCOUNTER — Other Ambulatory Visit: Payer: Self-pay | Admitting: *Deleted

## 2018-09-19 MED ORDER — AMLODIPINE BESYLATE 5 MG PO TABS: 5.0000 mg | ORAL_TABLET | Freq: Every day | ORAL | 0 refills | Status: DC

## 2018-10-15 ENCOUNTER — Other Ambulatory Visit (HOSPITAL_COMMUNITY): Payer: Self-pay | Admitting: Psychiatry

## 2018-10-29 ENCOUNTER — Other Ambulatory Visit: Payer: Self-pay | Admitting: *Deleted

## 2018-10-29 DIAGNOSIS — E782 Mixed hyperlipidemia: Secondary | ICD-10-CM

## 2018-10-29 MED ORDER — PRAVASTATIN SODIUM 40 MG PO TABS: 40.0000 mg | ORAL_TABLET | Freq: Every day | ORAL | 0 refills | Status: DC

## 2018-10-29 MED ORDER — EZETIMIBE 10 MG PO TABS: 10.0000 mg | ORAL_TABLET | Freq: Every day | ORAL | 0 refills | Status: DC

## 2018-11-06 DIAGNOSIS — F3131 Bipolar disorder, current episode depressed, mild: Secondary | ICD-10-CM | POA: Diagnosis not present

## 2018-11-18 ENCOUNTER — Other Ambulatory Visit: Payer: Self-pay | Admitting: *Deleted

## 2018-11-18 MED ORDER — TOLTERODINE TARTRATE ER 4 MG PO CP24: ORAL_CAPSULE | ORAL | 0 refills | Status: DC

## 2018-11-28 ENCOUNTER — Telehealth: Payer: Self-pay | Admitting: Emergency Medicine

## 2018-11-28 NOTE — Telephone Encounter (Signed)
Ok with me, thanks.

## 2018-11-28 NOTE — Telephone Encounter (Signed)
Pt would like to est with new PCP. Please advise if you are okay with accepting pt.   Pt also has a place on foot that she would like looked at that has been there for 3 weeks. No redness or infection like symptoms.

## 2018-11-29 NOTE — Telephone Encounter (Signed)
Spoke with pt to inform. appt scheduled.

## 2018-12-02 ENCOUNTER — Ambulatory Visit (INDEPENDENT_AMBULATORY_CARE_PROVIDER_SITE_OTHER): Payer: Medicare Other | Admitting: Internal Medicine

## 2018-12-02 ENCOUNTER — Other Ambulatory Visit (INDEPENDENT_AMBULATORY_CARE_PROVIDER_SITE_OTHER): Payer: Medicare Other

## 2018-12-02 ENCOUNTER — Other Ambulatory Visit: Payer: Self-pay

## 2018-12-02 ENCOUNTER — Encounter: Payer: Self-pay | Admitting: Internal Medicine

## 2018-12-02 VITALS — BP 132/72 | HR 87 | Temp 98.3°F | Resp 17 | Ht 64.0 in | Wt 175.0 lb

## 2018-12-02 DIAGNOSIS — J449 Chronic obstructive pulmonary disease, unspecified: Secondary | ICD-10-CM | POA: Diagnosis not present

## 2018-12-02 DIAGNOSIS — R7303 Prediabetes: Secondary | ICD-10-CM

## 2018-12-02 DIAGNOSIS — E611 Iron deficiency: Secondary | ICD-10-CM

## 2018-12-02 DIAGNOSIS — N183 Chronic kidney disease, stage 3 unspecified: Secondary | ICD-10-CM

## 2018-12-02 DIAGNOSIS — Z1211 Encounter for screening for malignant neoplasm of colon: Secondary | ICD-10-CM

## 2018-12-02 DIAGNOSIS — L84 Corns and callosities: Secondary | ICD-10-CM | POA: Diagnosis not present

## 2018-12-02 DIAGNOSIS — E782 Mixed hyperlipidemia: Secondary | ICD-10-CM

## 2018-12-02 DIAGNOSIS — E559 Vitamin D deficiency, unspecified: Secondary | ICD-10-CM

## 2018-12-02 DIAGNOSIS — E538 Deficiency of other specified B group vitamins: Secondary | ICD-10-CM | POA: Diagnosis not present

## 2018-12-02 DIAGNOSIS — Z23 Encounter for immunization: Secondary | ICD-10-CM | POA: Diagnosis not present

## 2018-12-02 DIAGNOSIS — I10 Essential (primary) hypertension: Secondary | ICD-10-CM | POA: Diagnosis not present

## 2018-12-02 LAB — CBC WITH DIFFERENTIAL/PLATELET
Basophils Absolute: 0.1 10*3/uL (ref 0.0–0.1)
Basophils Relative: 0.7 % (ref 0.0–3.0)
Eosinophils Absolute: 0.5 10*3/uL (ref 0.0–0.7)
Eosinophils Relative: 5.4 % — ABNORMAL HIGH (ref 0.0–5.0)
HCT: 39.1 % (ref 36.0–46.0)
Hemoglobin: 13.2 g/dL (ref 12.0–15.0)
Lymphocytes Relative: 36.4 % (ref 12.0–46.0)
Lymphs Abs: 3.4 10*3/uL (ref 0.7–4.0)
MCHC: 33.8 g/dL (ref 30.0–36.0)
MCV: 98.2 fl (ref 78.0–100.0)
Monocytes Absolute: 0.8 10*3/uL (ref 0.1–1.0)
Monocytes Relative: 8.3 % (ref 3.0–12.0)
Neutro Abs: 4.6 10*3/uL (ref 1.4–7.7)
Neutrophils Relative %: 49.2 % (ref 43.0–77.0)
Platelets: 206 10*3/uL (ref 150.0–400.0)
RBC: 3.98 Mil/uL (ref 3.87–5.11)
RDW: 12.2 % (ref 11.5–15.5)
WBC: 9.3 10*3/uL (ref 4.0–10.5)

## 2018-12-02 LAB — HEMOGLOBIN A1C: Hgb A1c MFr Bld: 5.9 % (ref 4.6–6.5)

## 2018-12-02 MED ORDER — AMLODIPINE BESYLATE 5 MG PO TABS: 5.0000 mg | ORAL_TABLET | Freq: Every day | ORAL | 3 refills | Status: DC

## 2018-12-02 MED ORDER — EZETIMIBE 10 MG PO TABS: 10.0000 mg | ORAL_TABLET | Freq: Every day | ORAL | 3 refills | Status: DC

## 2018-12-02 MED ORDER — PRAVASTATIN SODIUM 40 MG PO TABS: 40.0000 mg | ORAL_TABLET | Freq: Every day | ORAL | 3 refills | Status: DC

## 2018-12-02 MED ORDER — TOLTERODINE TARTRATE ER 4 MG PO CP24: ORAL_CAPSULE | ORAL | 3 refills | Status: DC

## 2018-12-02 MED ORDER — FAMOTIDINE 20 MG PO TABS: 20.0000 mg | ORAL_TABLET | Freq: Two times a day (BID) | ORAL | 3 refills | Status: DC

## 2018-12-02 NOTE — Patient Instructions (Addendum)
You had the Pneumovax pneumonia shot today]  You will be contacted regarding the referral for: colonoscopy, and podiatry (foot doctor)  Please continue all other medications as before, and refills have been done if requested, except the zantac was changed to Pepcid since the zantac is no longer available  Please have the pharmacy call with any other refills you may need.  Please continue your efforts at being more active, low cholesterol diet, and weight control.  You are otherwise up to date with prevention measures today.  Please keep your appointments with your specialists as you may have planned  Please go to the LAB in the Basement (turn left off the elevator) for the tests to be done today  You will be contacted by phone if any changes need to be made immediately.  Otherwise, you will receive a letter about your results with an explanation, but please check with MyChart first.  Please remember to sign up for MyChart if you have not done so, as this will be important to you in the future with finding out test results, communicating by private email, and scheduling acute appointments online when needed.  Please return in 1 year for your yearly visit, or sooner if needed

## 2018-12-02 NOTE — Progress Notes (Signed)
Subjective:    Patient ID: Paula [name], female    DOB: [birth year], 73 y.o.   MRN: [id number]  HPI  Here to f/u; overall doing ok,  Pt denies chest pain, increasing sob or doe, wheezing, orthopnea, PND, increased LE swelling, palpitations, dizziness or syncope.  Pt denies new neurological symptoms such as new headache, or facial or extremity weakness or numbness.  Pt denies polydipsia, polyuria, or low sugar episode.  Pt states overall good compliance with meds, mostly trying to follow appropriate diet, with wt overall stable,  but little exercise however.  Also has callous to right post lateral heel and lateral 5th MTP area, no ulcers but is somewhat tender Past Medical History:  Diagnosis Date  . Allergy   . Arthritis   . Asthma   . Bipolar 1 disorder ([place])   . Colon polyps   . Depression   . Diabetes mellitus without complication ([place])   . GERD (gastroesophageal reflux disease)   . History of alcohol abuse   . History of chicken pox   . History of drug abuse ([place])   . Hyperlipidemia   . Hypertension   . Urine incontinence    Past Surgical History:  Procedure Laterality Date  . APPENDECTOMY    . BREAST BIOPSY    . CHOLECYSTECTOMY    . LUNG BIOPSY    . SUBTOTAL COLECTOMY      reports that she quit smoking about 2 years ago. Her smoking use included cigarettes. She has a 55.00 pack-year smoking history. She has never used smokeless tobacco. She reports current alcohol use of about 4.0 - 6.0 standard drinks of alcohol per week. She reports current drug use. Frequency: 7.00 times per week. Drug: Marijuana. family history includes Arthritis in her father; COPD in her mother; Diabetes in her father; Heart disease in her father, paternal grandmother, and paternal uncle; Hyperlipidemia in her mother. Allergies  Allergen Reactions  . Penicillins    Current Outpatient Medications on File Prior to Visit  Medication Sig Dispense Refill  . aspirin EC 81 MG tablet Take 81 mg by mouth  daily.    . Cholecalciferol (VITAMIN D3) 2000 units TABS Take by mouth.    . divalproex (DEPAKOTE ER) 250 MG 24 hr tablet Take 750 mg by mouth every morning.  4  . divalproex (DEPAKOTE) 500 MG DR tablet Take 250 mg by mouth 3 (three) times daily.     Marland Kitchen FLUoxetine (PROZAC) 10 MG capsule Take 10 mg by mouth every morning.  2  . glucose blood (FREESTYLE LITE) test strip TEST TWICE A DAY. (E11.9) 100 each 12  . multivitamin-iron-minerals-folic acid (CENTRUM) chewable tablet Chew 1 tablet by mouth daily.    . Olopatadine HCl (PAZEO) 0.7 % SOLN Place 1 drop into both eyes daily. 1 mL 0  . Omega-3 Fatty Acids (FISH OIL) 1000 MG CAPS Take 2 capsules by mouth daily.    . QUEtiapine (SEROQUEL) 100 MG tablet Take 100 mg by mouth daily.   3  . QUEtiapine (SEROQUEL) 50 MG tablet Take 1 tablet by mouth daily.  3  . tizanidine (ZANAFLEX) 2 MG capsule Take 1 capsule (2 mg total) by mouth 3 (three) times daily. 30 capsule 0  . triamcinolone cream (KENALOG) 0.1 % Apply 1 application topically 2 (two) times daily. 30 g 0  . zolpidem (AMBIEN) 10 MG tablet Take 5 mg by mouth at bedtime as needed for sleep.      No current facility-administered medications on file  prior to visit.    Review of Systems  Constitutional: Negative for other unusual diaphoresis or sweats HENT: Negative for ear discharge or swelling Eyes: Negative for other worsening visual disturbances Respiratory: Negative for stridor or other swelling  Gastrointestinal: Negative for worsening distension or other blood Genitourinary: Negative for retention or other urinary change Musculoskeletal: Negative for other MSK pain or swelling Skin: Negative for color change or other new lesions Neurological: Negative for worsening tremors and other numbness  Psychiatric/Behavioral: Negative for worsening agitation or other fatigue All other system neg per pt    Objective:   Physical Exam BP 132/72   Pulse 87   Temp 98.3 F (36.8 C) (Oral)   Resp  17   Ht 5\' 4"  (1.626 m)   Wt 175 lb (79.4 kg)   SpO2 96%   BMI 30.04 kg/m  VS noted,  Constitutional: Pt appears in NAD HENT: Head: NCAT.  Right Ear: External ear normal.  Left Ear: External ear normal.  Eyes: . Pupils are equal, round, and reactive to light. Conjunctivae and EOM are normal Nose: without d/c or deformity Neck: Neck supple. Gross normal ROM Cardiovascular: Normal rate and regular rhythm.   Pulmonary/Chest: Effort normal and breath sounds without rales or wheezing.  Abd:  Soft, NT, ND, + BS, no organomegaly Neurological: Pt is alert. At baseline orientation, motor grossly intact Skin: Skin is warm. No rashes, other new lesions, no LE edema, has marked somewhat tender callous to right heel and lateral 5th MTP area Psychiatric: Pt behavior is normal without agitation  No other exam findings Lab Results  Component Value Date   WBC 9.3 12/02/2018   HGB 13.2 12/02/2018   HCT 39.1 12/02/2018   PLT 206.0 12/02/2018   GLUCOSE 82 12/02/2018   CHOL 161 12/02/2018   TRIG 247.0 (H) 12/02/2018   HDL 47.80 12/02/2018   LDLDIRECT 83.0 12/02/2018   LDLCALC 86 07/31/2017   ALT 23 12/02/2018   AST 19 12/02/2018   NA 139 12/02/2018   K 5.1 12/02/2018   CL 104 12/02/2018   CREATININE 1.14 12/02/2018   BUN 19 12/02/2018   CO2 28 12/02/2018   TSH 0.37 12/02/2018   HGBA1C 5.9 12/02/2018   MICROALBUR 1.3 03/13/2018      Assessment & Plan:

## 2018-12-03 ENCOUNTER — Encounter: Payer: Self-pay | Admitting: Internal Medicine

## 2018-12-03 LAB — HEPATIC FUNCTION PANEL
ALT: 23 U/L (ref 0–35)
AST: 19 U/L (ref 0–37)
Albumin: 4.1 g/dL (ref 3.5–5.2)
Alkaline Phosphatase: 76 U/L (ref 39–117)
Bilirubin, Direct: 0 mg/dL (ref 0.0–0.3)
Total Bilirubin: 0.3 mg/dL (ref 0.2–1.2)
Total Protein: 7.1 g/dL (ref 6.0–8.3)

## 2018-12-03 LAB — LIPID PANEL
Cholesterol: 161 mg/dL (ref 0–200)
HDL: 47.8 mg/dL (ref >39.00)
NonHDL: 113.36
Total CHOL/HDL Ratio: 3
Triglycerides: 247 mg/dL — ABNORMAL HIGH (ref 0.0–149.0)
VLDL: 49.4 mg/dL — ABNORMAL HIGH (ref 0.0–40.0)

## 2018-12-03 LAB — BASIC METABOLIC PANEL
BUN: 19 mg/dL (ref 6–23)
CO2: 28 mEq/L (ref 19–32)
Calcium: 9.8 mg/dL (ref 8.4–10.5)
Chloride: 104 mEq/L (ref 96–112)
Creatinine, Ser: 1.14 mg/dL (ref 0.40–1.20)
GFR: 46.65 mL/min — ABNORMAL LOW (ref >60.00)
Glucose, Bld: 82 mg/dL (ref 70–99)
Potassium: 5.1 mEq/L (ref 3.5–5.1)
Sodium: 139 mEq/L (ref 135–145)

## 2018-12-03 LAB — IBC PANEL
Iron: 78 ug/dL (ref 42–145)
Saturation Ratios: 25 % (ref 20.0–50.0)
Transferrin: 223 mg/dL (ref 212.0–360.0)

## 2018-12-03 LAB — LDL CHOLESTEROL, DIRECT: Direct LDL: 83 mg/dL

## 2018-12-03 LAB — VITAMIN D 25 HYDROXY (VIT D DEFICIENCY, FRACTURES): VITD: 71.88 ng/mL (ref 30.00–100.00)

## 2018-12-03 LAB — VITAMIN B12: Vitamin B-12: 554 pg/mL (ref 211–911)

## 2018-12-03 LAB — TSH: TSH: 0.37 u[IU]/mL (ref 0.35–4.50)

## 2018-12-06 ENCOUNTER — Encounter: Payer: Self-pay | Admitting: Internal Medicine

## 2018-12-06 DIAGNOSIS — L84 Corns and callosities: Secondary | ICD-10-CM | POA: Insufficient documentation

## 2018-12-06 NOTE — Assessment & Plan Note (Signed)
stable overall by history and exam, recent data reviewed with pt, and pt to continue medical treatment as before,  to f/u any worsening symptoms or concerns, to avoind nephrotoxins

## 2018-12-06 NOTE — Assessment & Plan Note (Signed)
stable overall by history and exam, recent data reviewed with pt, and pt to continue medical treatment as before,  to f/u any worsening symptoms or concerns  

## 2018-12-06 NOTE — Assessment & Plan Note (Signed)
stable overall by history and exam, recent data reviewed with pt, and pt to continue medical treatment as before,  to f/u any worsening symptoms or concerns for a1c with labs 

## 2018-12-06 NOTE — Assessment & Plan Note (Signed)
For podiatry referral,  to f/u any worsening symptoms or concerns

## 2019-01-10 DIAGNOSIS — E119 Type 2 diabetes mellitus without complications: Secondary | ICD-10-CM | POA: Diagnosis not present

## 2019-01-10 DIAGNOSIS — H40013 Open angle with borderline findings, low risk, bilateral: Secondary | ICD-10-CM | POA: Diagnosis not present

## 2019-01-10 DIAGNOSIS — H1859 Other hereditary corneal dystrophies: Secondary | ICD-10-CM | POA: Diagnosis not present

## 2019-01-10 DIAGNOSIS — H5213 Myopia, bilateral: Secondary | ICD-10-CM | POA: Diagnosis not present

## 2019-01-10 LAB — HM DIABETES EYE EXAM

## 2019-01-15 ENCOUNTER — Telehealth: Payer: Self-pay | Admitting: Internal Medicine

## 2019-01-15 DIAGNOSIS — F3131 Bipolar disorder, current episode depressed, mild: Secondary | ICD-10-CM | POA: Diagnosis not present

## 2019-01-15 NOTE — Telephone Encounter (Signed)
Pt has been informed.

## 2019-01-15 NOTE — Telephone Encounter (Signed)
Copied from [place] [phone]. Topic: Quick Communication - See Telephone Encounter >> Jan 15, 2019 12:07 PM Nils Flack wrote: CRM for notification. See Telephone encounter for: 01/15/19. Pt said her avs said to change how you take the tolterodine 4 MG 24 hr capsule and pt does not remember discussing it at the 12/02/18 visit.  Please advise

## 2019-01-15 NOTE — Telephone Encounter (Signed)
There were essentially no change, still to take once per day only

## 2019-01-21 DIAGNOSIS — K116 Mucocele of salivary gland: Secondary | ICD-10-CM | POA: Diagnosis not present

## 2019-01-21 DIAGNOSIS — D3709 Neoplasm of uncertain behavior of other specified sites of the oral cavity: Secondary | ICD-10-CM | POA: Diagnosis not present

## 2019-02-18 DIAGNOSIS — Z23 Encounter for immunization: Secondary | ICD-10-CM | POA: Diagnosis not present

## 2019-03-24 ENCOUNTER — Other Ambulatory Visit: Payer: Self-pay | Admitting: Internal Medicine

## 2019-03-24 DIAGNOSIS — Z1231 Encounter for screening mammogram for malignant neoplasm of breast: Secondary | ICD-10-CM

## 2019-05-14 ENCOUNTER — Ambulatory Visit
Admission: RE | Admit: 2019-05-14 | Discharge: 2019-05-14 | Disposition: A | Discharge status: Home / Self Care | Payer: Medicare Other | Source: Ambulatory Visit | Attending: Internal Medicine | Admitting: Internal Medicine

## 2019-05-14 ENCOUNTER — Other Ambulatory Visit: Payer: Self-pay

## 2019-05-14 DIAGNOSIS — Z1231 Encounter for screening mammogram for malignant neoplasm of breast: Secondary | ICD-10-CM

## 2019-05-15 DIAGNOSIS — F3131 Bipolar disorder, current episode depressed, mild: Secondary | ICD-10-CM | POA: Diagnosis not present

## 2019-07-21 ENCOUNTER — Telehealth: Payer: Self-pay

## 2019-07-21 NOTE — Telephone Encounter (Signed)
Spoke with patient today. States the 150/86 is highest number she has gotten.  Thursday: 130/74 Friday: 142/84 Saturday: 130/70 Yesterday: 150/86 Today: 132/64  Patient has been advised to continue to keep log of readings and call us back at the end of week with readings so we can gauge what she has been getting.  She has been told to keep any eye for the top number and to call if it continues to be in the 150's or higher or if bottom number creeps into the 90's or higher.  Patient has been told that pressure of 132/64 is not considered high blood pressure.

## 2019-07-21 NOTE — Telephone Encounter (Signed)
New message     1. What are your last BP readings?   Last night  150/86  This morning  132/64   2. Are you having any other symptoms (ex. Dizziness, headache, blurred vision, passed out)? No   3. What is your BP issue? The patient is saying blood pressure is higher in the last 6 weeks she has noticed.

## 2019-08-11 ENCOUNTER — Ambulatory Visit: Payer: Medicare Other | Attending: Internal Medicine

## 2019-08-11 DIAGNOSIS — Z23 Encounter for immunization: Secondary | ICD-10-CM | POA: Insufficient documentation

## 2019-08-11 NOTE — Progress Notes (Signed)
   Covid-19 Vaccination Clinic  Name:  Paula [name]    MRN: [id number] DOB: 07-18-1945  08/11/2019  [name] was observed post Covid-19 immunization for 15 minutes without incident. She was provided with Vaccine Information Sheet and instruction to access the V-Safe system.   [name] was instructed to call 911 with any severe reactions post vaccine: Marland Kitchen Difficulty breathing  . Swelling of face and throat  . A fast heartbeat  . A bad rash all over body  . Dizziness and weakness   Immunizations Administered    Name Date Dose VIS Date Route   Pfizer COVID-19 Vaccine 08/11/2019  2:00 PM 0.3 mL 05/16/2019 Intramuscular   Manufacturer: [place]   Lot: [id number]   [place]: [id number]

## 2019-08-14 DIAGNOSIS — F3131 Bipolar disorder, current episode depressed, mild: Secondary | ICD-10-CM | POA: Diagnosis not present

## 2019-09-10 ENCOUNTER — Ambulatory Visit: Payer: Medicare Other | Attending: Internal Medicine

## 2019-09-10 DIAGNOSIS — Z23 Encounter for immunization: Secondary | ICD-10-CM

## 2019-09-10 NOTE — Progress Notes (Signed)
   Covid-19 Vaccination Clinic  Name:  Paula [name]    MRN: [id number] DOB: 09-28-1945  09/10/2019  Paula [name] was observed post Covid-19 immunization for 15 minutes without incident. She was provided with Vaccine Information Sheet and instruction to access the V-Safe system.   Paula [name] was instructed to call 911 with any severe reactions post vaccine: Marland Kitchen Difficulty breathing  . Swelling of face and throat  . A fast heartbeat  . A bad rash all over body  . Dizziness and weakness   Immunizations Administered    Name Date Dose VIS Date Route   Pfizer COVID-19 Vaccine 09/10/2019  1:42 PM 0.3 mL 05/16/2019 Intramuscular   Manufacturer: [place]   Lot: [id number]   [place]: [id number]

## 2019-09-15 ENCOUNTER — Other Ambulatory Visit: Payer: Self-pay | Admitting: Internal Medicine

## 2019-09-15 MED ORDER — FREESTYLE LITE TEST VI STRP: ORAL_STRIP | 12 refills | Status: DC

## 2019-09-15 NOTE — Telephone Encounter (Signed)
New message:   1.Medication Requested: glucose blood (FREESTYLE LITE) test strip 2. Pharmacy (Name, Street, [place]): [id number] [place], [place] 3. On Med List: yes  4. Last Visit with PCP: 12/02/18  5. Next visit date with PCP: None   Agent: Please be advised that RX refills may take up to 3 business days. We ask that you follow-up with your pharmacy.

## 2019-09-15 NOTE — Telephone Encounter (Signed)
erx sent as requested.  

## 2019-09-29 MED ORDER — FREESTYLE LITE TEST VI STRP: ORAL_STRIP | 3 refills | Status: DC

## 2019-09-29 NOTE — Telephone Encounter (Signed)
Called pharmacy and they stated that insurance would not pay for testing more than once per day.   Erx resent.  Pt contacted and informed of same.

## 2019-09-29 NOTE — Telephone Encounter (Signed)
Patient calling and states that the pharmacy states that they have not received the prescription. Attempted to call pharmacy, but was on hold for about 8 mins. Please advise.   [id number] [place], [place]

## 2019-09-29 NOTE — Addendum Note (Signed)
Addended by: Karle Barr on: 09/29/2019 02:47 PM   Modules accepted: Orders

## 2019-12-11 ENCOUNTER — Encounter: Payer: Self-pay | Admitting: Internal Medicine

## 2019-12-11 ENCOUNTER — Other Ambulatory Visit: Payer: Self-pay

## 2019-12-11 ENCOUNTER — Ambulatory Visit (INDEPENDENT_AMBULATORY_CARE_PROVIDER_SITE_OTHER): Payer: Medicare Other

## 2019-12-11 ENCOUNTER — Ambulatory Visit (INDEPENDENT_AMBULATORY_CARE_PROVIDER_SITE_OTHER): Payer: Medicare Other | Admitting: Internal Medicine

## 2019-12-11 VITALS — BP 110/70 | HR 90 | Temp 98.0°F | Ht 64.0 in | Wt 174.0 lb

## 2019-12-11 VITALS — BP 110/70 | HR 90 | Ht 64.0 in | Wt 174.0 lb

## 2019-12-11 DIAGNOSIS — R7303 Prediabetes: Secondary | ICD-10-CM

## 2019-12-11 DIAGNOSIS — N183 Chronic kidney disease, stage 3 unspecified: Secondary | ICD-10-CM | POA: Diagnosis not present

## 2019-12-11 DIAGNOSIS — R21 Rash and other nonspecific skin eruption: Secondary | ICD-10-CM | POA: Insufficient documentation

## 2019-12-11 DIAGNOSIS — E782 Mixed hyperlipidemia: Secondary | ICD-10-CM | POA: Diagnosis not present

## 2019-12-11 DIAGNOSIS — I10 Essential (primary) hypertension: Secondary | ICD-10-CM

## 2019-12-11 DIAGNOSIS — Z0000 Encounter for general adult medical examination without abnormal findings: Secondary | ICD-10-CM

## 2019-12-11 DIAGNOSIS — F3181 Bipolar II disorder: Secondary | ICD-10-CM | POA: Diagnosis not present

## 2019-12-11 MED ORDER — TRIAMCINOLONE ACETONIDE 0.1 % EX CREA: 1.0000 "application " | TOPICAL_CREAM | Freq: Two times a day (BID) | CUTANEOUS | 1 refills | Status: DC

## 2019-12-11 NOTE — Progress Notes (Signed)
Subjective:   Paula [name] is a 74 y.o. female who presents for Medicare Annual (Subsequent) preventive examination.  Review of Systems    No ROS. Medicare Wellness Visit Cardiac Risk Factors include: advanced age (>62men, >44 women);dyslipidemia;family history of premature cardiovascular disease;hypertension     Objective:    Today's Vitals   12/11/19 1425 12/11/19 1426  BP: 110/70   Pulse: 90   SpO2: 95%   Weight: 174 lb (78.9 kg)   Height: 5\' 4"  (1.626 m)   PainSc: 0-No pain 0-No pain   Body mass index is 29.87 kg/m.  Advanced Directives 12/11/2019 02/12/2017  Does Patient Have a Medical Advance Directive? Yes Yes  Type of Paramedic of [place];Living will [place];Living will  Does patient want to make changes to medical advance directive? No - Patient declined -  Copy of [place] in Chart? No - copy requested No - copy requested    Current Medications (verified) Outpatient Encounter Medications as of 12/11/2019  Medication Sig   amLODipine (NORVASC) 5 MG tablet TAKE 1 TABLET BY MOUTH EVERY DAY   aspirin EC 81 MG tablet Take 81 mg by mouth daily.   Cholecalciferol (VITAMIN D3) 2000 units TABS Take by mouth.   divalproex (DEPAKOTE ER) 250 MG 24 hr tablet Take 750 mg by mouth every morning.   ezetimibe (ZETIA) 10 MG tablet Take 1 tablet (10 mg total) by mouth daily. Needs visit for future refills.   famotidine (PEPCID) 20 MG tablet Take 1 tablet (20 mg total) by mouth 2 (two) times daily. Annual appt due in [place] must see provider for future refills   FLUoxetine (PROZAC) 10 MG capsule Take 10 mg by mouth every morning.   glucose blood (FREESTYLE LITE) test strip TEST ONCE A DAY. (DX: E11.9)   multivitamin-iron-minerals-folic acid (CENTRUM) chewable tablet Chew 1 tablet by mouth daily.   Omega-3 Fatty Acids (FISH OIL) 1000 MG CAPS Take 2 capsules by mouth daily.   pravastatin (PRAVACHOL) 40 MG  tablet Take 1 tablet (40 mg total) by mouth daily. Needs visit for future refills.   QUEtiapine (SEROQUEL) 50 MG tablet Take 1 tablet by mouth 2 (two) times daily.    SODIUM FLUORIDE 5000 PPM 1.1 % PSTE USE PEA SIZED AMOUNT & BRUSH DAILY X 2 MINS, SPIT. DO NOT RINSE OR EAT FOR 30 MINS   tolterodine (DETROL LA) 4 MG 24 hr capsule TAKE 1 CAPSULE BY MOUTH EVERY DAY   zolpidem (AMBIEN) 10 MG tablet Take 5 mg by mouth at bedtime as needed for sleep.    No facility-administered encounter medications on file as of 12/11/2019.    Allergies (verified) Penicillins   History: Past Medical History:  Diagnosis Date   Allergy    Arthritis    Asthma    Bipolar 1 disorder ([place])    Colon polyps    Depression    Diabetes mellitus without complication (HCC)    GERD (gastroesophageal reflux disease)    History of alcohol abuse    History of chicken pox    History of drug abuse ([place])    Hyperlipidemia    Hypertension    Urine incontinence    Past Surgical History:  Procedure Laterality Date   APPENDECTOMY     BREAST BIOPSY     CHOLECYSTECTOMY     LUNG BIOPSY     SUBTOTAL COLECTOMY     Family History  Problem Relation Age of Onset   Hyperlipidemia  Mother    COPD Mother        smoked   Arthritis Father    Heart disease Father    Diabetes Father    Heart disease Paternal Uncle    Heart disease Paternal Grandmother    Social History   Socioeconomic History   Marital status: Single    Spouse name: Not on file   Number of children: 1   Years of education: 14   Highest education level: Not on file  Occupational History   Occupation: Retired Therapist, sports  Tobacco Use   Smoking status: Former Smoker    Packs/day: 1.00    Years: 55.00    Pack years: 55.00    Types: Cigarettes    Quit date: 08/03/2016    Years since quitting: 3.3   Smokeless tobacco: Never Used  Vaping Use   Vaping Use: Never used  Substance and Sexual Activity   Alcohol use: Yes     Alcohol/week: 4.0 - 6.0 standard drinks    Types: 4 - 6 Cans of beer per week   Drug use: Yes    Frequency: 7.0 times per week    Types: Marijuana   Sexual activity: Not on file  Other Topics Concern   Not on file  Social History Narrative   Fun/Hobby: Read   Denies abuse and feels safe at home    Social Determinants of Health   Financial Resource Strain: Low Risk    Difficulty of Paying Living Expenses: Not hard at all  Food Insecurity: No Food Insecurity   Worried About Charity fundraiser in the Last Year: Never true   Ran Out of Food in the Last Year: Never true  Transportation Needs: No Transportation Needs   Lack of Transportation (Medical): No   Lack of Transportation (Non-Medical): No  Physical Activity: Insufficiently Active   Days of Exercise per Week: 6 days   Minutes of Exercise per Session: 10 min  Stress: No Stress Concern Present   Feeling of Stress : Not at all  Social Connections:    Frequency of Communication with Friends and Family:    Frequency of Social Gatherings with Friends and Family:    Attends Religious Services:    Active Member of Clubs or Organizations:    Attends Music therapist:    Marital Status:     Tobacco Counseling Counseling given: No   Clinical Intake:  Pre-visit preparation completed: Yes  Pain : No/denies pain Pain Score: 0-No pain     BMI - recorded: 29.87 Nutritional Status: BMI 25 -29 Overweight Nutritional Risks: None Diabetes: No  How often do you need to have someone help you when you read instructions, pamphlets, or other written materials from your doctor or pharmacy?: 1 - Never What is the last grade level you completed in school?: HSG  Diabetic? no  Interpreter Needed?: No  Information entered by :: Paula Dhaliwal N. Kazimir Hartnett, LPN   Activities of Daily Living In your present state of health, do you have any difficulty performing the following activities: 12/11/2019  Hearing? N    Vision? N  Difficulty concentrating or making decisions? N  Walking or climbing stairs? N  Dressing or bathing? N  Doing errands, shopping? N  Preparing Food and eating ? N  Using the Toilet? N  In the past six months, have you accidently leaked urine? N  Do you have problems with loss of bowel control? N  Managing your Medications? N  Managing your Finances?  N  Housekeeping or managing your Housekeeping? N  Some recent data might be hidden    Patient Care Team: Biagio Borg, MD as PCP - General (Internal Medicine)  Indicate any recent Medical Services you may have received from other than [facility] providers in the past year (date may be approximate).     Assessment:   This is a routine wellness examination for [place].  Hearing/Vision screen No exam data present  Dietary issues and exercise activities discussed: Current Exercise Habits: The patient does not participate in regular exercise at present, Exercise limited by: respiratory conditions(s)  Goals     Go back to  the gym     I will visit the gym close to my house and go walk in the park.      Depression Screen PHQ 2/9 Scores 12/11/2019 12/02/2018 03/13/2018 02/12/2017  PHQ - 2 Score 0 1 0 3  PHQ- 9 Score - - - 5    Fall Risk Fall Risk  12/11/2019 12/02/2018 03/13/2018 02/12/2017  Falls in the past year? 0 0 No Yes  Number falls in past yr: 0 - - 2 or more  Injury with Fall? 0 - - -  Risk for fall due to : No Fall Risks - - -  Follow up Falls evaluation completed - - Falls prevention discussed;Education provided    Any stairs in or around the home? No  If so, are there any without handrails? No  Home free of loose throw rugs in walkways, pet beds, electrical cords, etc? Yes  Adequate lighting in your home to reduce risk of falls? Yes   ASSISTIVE DEVICES UTILIZED TO PREVENT FALLS:  Life alert? No  Use of a cane, walker or w/c? No  Grab bars in the bathroom? Yes  Shower chair or bench in shower? Yes  Elevated  toilet seat or a handicapped toilet? No   TIMED UP AND GO:  Was the test performed? No .  Length of time to ambulate 10 feet: 0 sec.   Gait steady and fast without use of assistive device  Cognitive Function: MMSE - Mini Mental State Exam 02/12/2017  Orientation to time 5  Orientation to Place 5  Registration 3  Attention/ Calculation 5  Recall 2  Language- name 2 objects 2  Language- repeat 1  Language- follow 3 step command 3  Language- read & follow direction 1  Write a sentence 1  Copy design 1  Total score 29     6CIT Screen 12/11/2019  What Year? 0 points  What month? 0 points  What time? 3 points  Count back from 20 0 points  Months in reverse 0 points  Repeat phrase 0 points  Total Score 3    Immunizations Immunization History  Administered Date(s) Administered   Influenza, High Dose Seasonal PF 01/30/2017, 02/06/2017, 03/13/2018   Influenza-Unspecified 02/03/2017   PFIZER SARS-COV-2 Vaccination 08/11/2019, 09/10/2019   Pneumococcal Conjugate-13 02/12/2017   Pneumococcal Polysaccharide-23 12/02/2018   Tdap 05/25/2017    TDAP status: Up to date Flu Vaccine status: Up to date Pneumococcal vaccine status: Up to date Covid-19 vaccine status: Completed vaccines  Qualifies for Shingles Vaccine? Yes   Zostavax completed No   Shingrix Completed?: No.    Education has been provided regarding the importance of this vaccine. Patient has been advised to call insurance company to determine out of pocket expense if they have not yet received this vaccine. Advised may also receive vaccine at local pharmacy or Health Dept.  Verbalized acceptance and understanding.  Screening Tests Health Maintenance  Topic Date Due   COLONOSCOPY  Never done   URINE MICROALBUMIN  03/14/2019   INFLUENZA VACCINE  01/04/2020   MAMMOGRAM  05/13/2021   TETANUS/TDAP  05/26/2027   DEXA SCAN  Completed   COVID-19 Vaccine  Completed   Hepatitis C Screening  Completed   PNA  vac Low Risk Adult  Completed    Health Maintenance  Health Maintenance Due  Topic Date Due   COLONOSCOPY  Never done   URINE MICROALBUMIN  03/14/2019    Colorectal cancer screening: No longer required.  Mammogram status: Completed 05/14/2019. Repeat every year Bone Density status: Completed 03/30/2017. Results reflect: Bone density results: OSTEOPOROSIS. Repeat every 2 years.  Lung Cancer Screening: (Low Dose CT Chest recommended if Age 57-80 years, 30 pack-year currently smoking OR have quit w/in 15years.) does qualify.   Lung Cancer Screening Referral: no  Additional Screening:  Hepatitis C Screening: does qualify; Completed yes  Vision Screening: Recommended annual ophthalmology exams for early detection of glaucoma and other disorders of the eye. Is the patient up to date with their annual eye exam?  Yes  Who is the provider or what is the name of the office in which the patient attends annual eye exams? Marygrace Drought, MD If pt is not established with a provider, would they like to be referred to a provider to establish care? No .   Dental Screening: Recommended annual dental exams for proper oral hygiene  Community Resource Referral / Chronic Care Management: CRR required this visit?  No   CCM required this visit?  No      Plan:     I have personally reviewed and noted the following in the patients chart:    Medical and social history  Use of alcohol, tobacco or illicit drugs   Current medications and supplements  Functional ability and status  Nutritional status  Physical activity  Advanced directives  List of other physicians  Hospitalizations, surgeries, and ER visits in previous 12 months  Vitals  Screenings to include cognitive, depression, and falls  Referrals and appointments  In addition, I have reviewed and discussed with patient certain preventive protocols, quality metrics, and best practice recommendations. A written personalized  care plan for preventive services as well as general preventive health recommendations were provided to patient.     Sheral Flow, LPN   [id number]/[id number]   Nurse Notes:

## 2019-12-11 NOTE — Patient Instructions (Addendum)
You will be contacted regarding the referral for: colonoscopy  Please take all new medication as prescribed - the cream for the rash  Please continue all other medications as before, and refills have been done if requested.  Please have the pharmacy call with any other refills you may need.  Please continue your efforts at being more active, low cholesterol diet, and weight control.  You are otherwise up to date with prevention measures today.  Please keep your appointments with your specialists as you may have planned  Please go to the LAB at the blood drawing area for the tests to be done - including the depakote level  You will be contacted by phone if any changes need to be made immediately.  Otherwise, you will receive a letter about your results with an explanation, but please check with MyChart first.  Please remember to sign up for MyChart if you have not done so, as this will be important to you in the future with finding out test results, communicating by private email, and scheduling acute appointments online when needed.  Please make an Appointment to return in 6 months, or sooner if needed, to check the kidneys

## 2019-12-11 NOTE — Progress Notes (Signed)
Subjective:    Patient ID: Paula [name], female    DOB: May 23, 1946, 74 y.o.   MRN: [id number]  HPI  Here to f/u; overall doing ok,  Pt denies chest pain, increasing sob or doe, wheezing, orthopnea, PND, increased LE swelling, palpitations, dizziness or syncope.  Pt denies new neurological symptoms such as new headache, or facial or extremity weakness or numbness.  Pt denies polydipsia, polyuria, or low sugar episode.  Pt states overall good compliance with meds, mostly trying to follow appropriate diet, with wt overall stable,  but little exercise however.  Denies worsening depressive symptoms, suicidal ideation, or panic; has ongoing anxiety, asks for depakote level.  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  No other new complaints Past Medical History:  Diagnosis Date  . Allergy   . Arthritis   . Asthma   . Bipolar 1 disorder ([place])   . Colon polyps   . Depression   . Diabetes mellitus without complication ([place])   . GERD (gastroesophageal reflux disease)   . History of alcohol abuse   . History of chicken pox   . History of drug abuse ([place])   . Hyperlipidemia   . Hypertension   . Urine incontinence    Past Surgical History:  Procedure Laterality Date  . APPENDECTOMY    . BREAST BIOPSY    . CHOLECYSTECTOMY    . LUNG BIOPSY    . SUBTOTAL COLECTOMY      reports that she quit smoking about 3 years ago. Her smoking use included cigarettes. She has a 55.00 pack-year smoking history. She has never used smokeless tobacco. She reports current alcohol use of about 4.0 - 6.0 standard drinks of alcohol per week. She reports current drug use. Frequency: 7.00 times per week. Drug: Marijuana. family history includes Arthritis in her father; COPD in her mother; Diabetes in her father; Heart disease in her father, paternal grandmother, and paternal uncle; Hyperlipidemia in her mother. Allergies  Allergen Reactions  . Penicillins    Current  Outpatient Medications on File Prior to Visit  Medication Sig Dispense Refill  . amLODipine (NORVASC) 5 MG tablet TAKE 1 TABLET BY MOUTH EVERY DAY 90 tablet 0  . aspirin EC 81 MG tablet Take 81 mg by mouth daily.    . Cholecalciferol (VITAMIN D3) 2000 units TABS Take by mouth.    . divalproex (DEPAKOTE ER) 250 MG 24 hr tablet Take 750 mg by mouth every morning.  4  . ezetimibe (ZETIA) 10 MG tablet Take 1 tablet (10 mg total) by mouth daily. Needs visit for future refills. 90 tablet 3  . famotidine (PEPCID) 20 MG tablet Take 1 tablet (20 mg total) by mouth 2 (two) times daily. Annual appt due in [place] must see provider for future refills 180 tablet 0  . FLUoxetine (PROZAC) 10 MG capsule Take 10 mg by mouth every morning.  2  . glucose blood (FREESTYLE LITE) test strip TEST ONCE A DAY. (DX: E11.9) 100 each 3  . multivitamin-iron-minerals-folic acid (CENTRUM) chewable tablet Chew 1 tablet by mouth daily.    . Omega-3 Fatty Acids (FISH OIL) 1000 MG CAPS Take 2 capsules by mouth daily.    . pravastatin (PRAVACHOL) 40 MG tablet Take 1 tablet (40 mg total) by mouth daily. Needs visit for future refills. 90 tablet 3  . QUEtiapine (SEROQUEL) 50 MG tablet Take 1 tablet by mouth 2 (two) times daily.   3  . tolterodine (  DETROL LA) 4 MG 24 hr capsule TAKE 1 CAPSULE BY MOUTH EVERY DAY 90 capsule 3  . zolpidem (AMBIEN) 10 MG tablet Take 5 mg by mouth at bedtime as needed for sleep.     . SODIUM FLUORIDE 5000 PPM 1.1 % PSTE USE PEA SIZED AMOUNT & BRUSH DAILY X 2 MINS, SPIT. DO NOT RINSE OR EAT FOR 30 MINS     No current facility-administered medications on file prior to visit.   Review of Systems All otherwise neg per pt    Objective:   Physical Exam BP 110/70 (BP Location: Left Arm, Patient Position: Sitting, Cuff Size: Large)   Pulse 90   Temp 98 F (36.7 C) (Oral)   Ht 5\' 4"  (1.626 m)   Wt 174 lb (78.9 kg)   SpO2 95%   BMI 29.87 kg/m  VS noted,  Constitutional: Pt appears in NAD HENT: Head:  NCAT.  Right Ear: External ear normal.  Left Ear: External ear normal.  Eyes: . Pupils are equal, round, and reactive to light. Conjunctivae and EOM are normal Nose: without d/c or deformity Neck: Neck supple. Gross normal ROM Cardiovascular: Normal rate and regular rhythm.   Pulmonary/Chest: Effort normal and breath sounds without rales or wheezing.  Abd:  Soft, NT, ND, + BS, no organomegaly Neurological: Pt is alert. At baseline orientation, motor grossly intact Skin: Skin is warm.  no LE edema, has slight scaly erythem rash to bilat elbow extensor surfaces Psychiatric: Pt behavior is normal without agitation  Lab Results  Component Value Date   WBC 8.1 12/11/2019   HGB 13.8 12/11/2019   HCT 41.0 12/11/2019   PLT 169 12/11/2019   GLUCOSE 110 (H) 12/11/2019   CHOL 164 12/11/2019   TRIG 120 12/11/2019   HDL 60 12/11/2019   LDLDIRECT 83.0 12/02/2018   LDLCALC 82 12/11/2019   ALT 14 12/11/2019   AST 14 12/11/2019   NA 140 12/11/2019   K 4.6 12/11/2019   CL 104 12/11/2019   CREATININE 1.44 (H) 12/11/2019   BUN 25 12/11/2019   CO2 26 12/11/2019   TSH 0.44 12/11/2019   HGBA1C 5.9 (H) 12/11/2019   MICROALBUR 1.3 03/13/2018      Assessment & Plan:

## 2019-12-11 NOTE — Patient Instructions (Addendum)
Paula [name] , Thank you for taking time to come for your Medicare Wellness Visit. I appreciate your ongoing commitment to your health goals. Please review the following plan we discussed and let me know if I can assist you in the future.   Screening recommendations/referrals: Colonoscopy: never done Mammogram: 05/14/2019 Bone Density: 03/30/2017; due every 2 years Recommended yearly ophthalmology/optometry visit for glaucoma screening and checkup Recommended yearly dental visit for hygiene and checkup  Vaccinations: Influenza vaccine: 02/18/2019 Pneumococcal vaccine: completed Tdap vaccine: 05/25/2017; due every 10 years Shingles vaccine: never done   Covid-19: completed  Advanced directives: Please bring a copy of your health care power of attorney and living will to the office at your convenience.  Conditions/risks identified: Please continue to do your personal lifestyle choices by: daily care of teeth and gums, regular physical activity (goal should be 5 days a week for 30 minutes), eat a healthy diet, avoid tobacco and drug use, limiting any alcohol intake, taking a low-dose aspirin (if not allergic or have been advised by your provider otherwise) and taking vitamins and minerals as recommended by your provider. Continue doing brain stimulating activities (puzzles, reading, adult coloring books, staying active) to keep memory sharp. Continue to eat heart healthy diet (full of fruits, vegetables, whole grains, lean protein, water--limit salt, fat, and sugar intake) and increase physical activity as tolerated.  Next appointment: Please schedule your next Medicare Wellness Visit with your Nurse Health Advisor in 1 year.   Preventive Care 75 Years and Older, Female Preventive care refers to lifestyle choices and visits with your health care provider that can promote health and wellness. What does preventive care include?  A yearly physical exam. This is also called an annual well  check.  Dental exams once or twice a year.  Routine eye exams. Ask your health care provider how often you should have your eyes checked.  Personal lifestyle choices, including:  Daily care of your teeth and gums.  Regular physical activity.  Eating a healthy diet.  Avoiding tobacco and drug use.  Limiting alcohol use.  Practicing safe sex.  Taking low-dose aspirin every day.  Taking vitamin and mineral supplements as recommended by your health care provider. What happens during an annual well check? The services and screenings done by your health care provider during your annual well check will depend on your age, overall health, lifestyle risk factors, and family history of disease. Counseling  Your health care provider may ask you questions about your:  Alcohol use.  Tobacco use.  Drug use.  Emotional well-being.  Home and relationship well-being.  Sexual activity.  Eating habits.  History of falls.  Memory and ability to understand (cognition).  Work and work Statistician.  Reproductive health. Screening  You may have the following tests or measurements:  Height, weight, and BMI.  Blood pressure.  Lipid and cholesterol levels. These may be checked every 5 years, or more frequently if you are over 75 years old.  Skin check.  Lung cancer screening. You may have this screening every year starting at age 75 if you have a 30-pack-year history of smoking and currently smoke or have quit within the past 15 years.  Fecal occult blood test (FOBT) of the stool. You may have this test every year starting at age 75.  Flexible sigmoidoscopy or colonoscopy. You may have a sigmoidoscopy every 5 years or a colonoscopy every 10 years starting at age 75.  Hepatitis C blood test.  Hepatitis B blood test.  Sexually transmitted disease (STD) testing.  Diabetes screening. This is done by checking your blood sugar (glucose) after you have not eaten for a while  (fasting). You may have this done every 1-3 years.  Bone density scan. This is done to screen for osteoporosis. You may have this done starting at age 75.  Mammogram. This may be done every 1-2 years. Talk to your health care provider about how often you should have regular mammograms. Talk with your health care provider about your test results, treatment options, and if necessary, the need for more tests. Vaccines  Your health care provider may recommend certain vaccines, such as:  Influenza vaccine. This is recommended every year.  Tetanus, diphtheria, and acellular pertussis (Tdap, Td) vaccine. You may need a Td booster every 10 years.  Zoster vaccine. You may need this after age 63.  Pneumococcal 13-valent conjugate (PCV13) vaccine. One dose is recommended after age 21.  Pneumococcal polysaccharide (PPSV23) vaccine. One dose is recommended after age 66. Talk to your health care provider about which screenings and vaccines you need and how often you need them. This information is not intended to replace advice given to you by your health care provider. Make sure you discuss any questions you have with your health care provider. Document Released: 06/18/2015 Document Revised: 02/09/2016 Document Reviewed: 03/23/2015 Elsevier Interactive Patient Education  2017 [place] Prevention in the Home Falls can cause injuries. They can happen to people of all ages. There are many things you can do to make your home safe and to help prevent falls. What can I do on the outside of my home?  Regularly fix the edges of walkways and driveways and fix any cracks.  Remove anything that might make you trip as you walk through a door, such as a raised step or threshold.  Trim any bushes or trees on the path to your home.  Use bright outdoor lighting.  Clear any walking paths of anything that might make someone trip, such as rocks or tools.  Regularly check to see if handrails are loose  or broken. Make sure that both sides of any steps have handrails.  Any raised decks and porches should have guardrails on the edges.  Have any leaves, snow, or ice cleared regularly.  Use sand or salt on walking paths during winter.  Clean up any spills in your garage right away. This includes oil or grease spills. What can I do in the bathroom?  Use night lights.  Install grab bars by the toilet and in the tub and shower. Do not use towel bars as grab bars.  Use non-skid mats or decals in the tub or shower.  If you need to sit down in the shower, use a plastic, non-slip stool.  Keep the floor dry. Clean up any water that spills on the floor as soon as it happens.  Remove soap buildup in the tub or shower regularly.  Attach bath mats securely with double-sided non-slip rug tape.  Do not have throw rugs and other things on the floor that can make you trip. What can I do in the bedroom?  Use night lights.  Make sure that you have a light by your bed that is easy to reach.  Do not use any sheets or blankets that are too big for your bed. They should not hang down onto the floor.  Have a firm chair that has side arms. You can use this for support while you get  dressed.  Do not have throw rugs and other things on the floor that can make you trip. What can I do in the kitchen?  Clean up any spills right away.  Avoid walking on wet floors.  Keep items that you use a lot in easy-to-reach places.  If you need to reach something above you, use a strong step stool that has a grab bar.  Keep electrical cords out of the way.  Do not use floor polish or wax that makes floors slippery. If you must use wax, use non-skid floor wax.  Do not have throw rugs and other things on the floor that can make you trip. What can I do with my stairs?  Do not leave any items on the stairs.  Make sure that there are handrails on both sides of the stairs and use them. Fix handrails that are  broken or loose. Make sure that handrails are as long as the stairways.  Check any carpeting to make sure that it is firmly attached to the stairs. Fix any carpet that is loose or worn.  Avoid having throw rugs at the top or bottom of the stairs. If you do have throw rugs, attach them to the floor with carpet tape.  Make sure that you have a light switch at the top of the stairs and the bottom of the stairs. If you do not have them, ask someone to add them for you. What else can I do to help prevent falls?  Wear shoes that:  Do not have high heels.  Have rubber bottoms.  Are comfortable and fit you well.  Are closed at the toe. Do not wear sandals.  If you use a stepladder:  Make sure that it is fully opened. Do not climb a closed stepladder.  Make sure that both sides of the stepladder are locked into place.  Ask someone to hold it for you, if possible.  Clearly mark and make sure that you can see:  Any grab bars or handrails.  First and last steps.  Where the edge of each step is.  Use tools that help you move around (mobility aids) if they are needed. These include:  Canes.  Walkers.  Scooters.  Crutches.  Turn on the lights when you go into a dark area. Replace any light bulbs as soon as they burn out.  Set up your furniture so you have a clear path. Avoid moving your furniture around.  If any of your floors are uneven, fix them.  If there are any pets around you, be aware of where they are.  Review your medicines with your doctor. Some medicines can make you feel dizzy. This can increase your chance of falling. Ask your doctor what other things that you can do to help prevent falls. This information is not intended to replace advice given to you by your health care provider. Make sure you discuss any questions you have with your health care provider. Document Released: 03/18/2009 Document Revised: 10/28/2015 Document Reviewed: 06/26/2014 Elsevier  Interactive Patient Education  2017 [employer].

## 2019-12-12 ENCOUNTER — Encounter: Payer: Self-pay | Admitting: Internal Medicine

## 2019-12-12 ENCOUNTER — Other Ambulatory Visit: Payer: Self-pay | Admitting: Internal Medicine

## 2019-12-12 ENCOUNTER — Encounter: Payer: Self-pay | Admitting: Gastroenterology

## 2019-12-12 LAB — LIPID PANEL
Cholesterol: 164 mg/dL (ref <200)
HDL: 60 mg/dL (ref >=50)
LDL Cholesterol (Calc): 82 mg/dL (calc)
Non-HDL Cholesterol (Calc): 104 mg/dL (calc) (ref <130)
Total CHOL/HDL Ratio: 2.7 (calc) (ref <5.0)
Triglycerides: 120 mg/dL (ref <150)

## 2019-12-12 LAB — CBC WITH DIFFERENTIAL/PLATELET
Absolute Monocytes: 786 cells/uL (ref 200–950)
Basophils Absolute: 73 cells/uL (ref 0–200)
Basophils Relative: 0.9 %
Eosinophils Absolute: 413 cells/uL (ref 15–500)
Eosinophils Relative: 5.1 %
HCT: 41 % (ref 35.0–45.0)
Hemoglobin: 13.8 g/dL (ref 11.7–15.5)
Lymphs Abs: 2179 cells/uL (ref 850–3900)
MCH: 32.2 pg (ref 27.0–33.0)
MCHC: 33.7 g/dL (ref 32.0–36.0)
MCV: 95.6 fL (ref 80.0–100.0)
MPV: 12.6 fL — ABNORMAL HIGH (ref 7.5–12.5)
Monocytes Relative: 9.7 %
Neutro Abs: 4649 cells/uL (ref 1500–7800)
Neutrophils Relative %: 57.4 %
Platelets: 169 10*3/uL (ref 140–400)
RBC: 4.29 10*6/uL (ref 3.80–5.10)
RDW: 11.4 % (ref 11.0–15.0)
Total Lymphocyte: 26.9 %
WBC: 8.1 10*3/uL (ref 3.8–10.8)

## 2019-12-12 LAB — URINALYSIS, ROUTINE W REFLEX MICROSCOPIC
Bilirubin Urine: NEGATIVE
Glucose, UA: NEGATIVE
Hgb urine dipstick: NEGATIVE
Nitrite: NEGATIVE
RBC / HPF: NONE SEEN /HPF (ref 0–2)
Specific Gravity, Urine: 1.021 (ref 1.001–1.03)
Squamous Epithelial / HPF: >=28 /HPF — AB (ref <=5)
WBC, UA: >=60 /HPF — AB (ref 0–5)
pH: 6 (ref 5.0–8.0)

## 2019-12-12 LAB — BASIC METABOLIC PANEL
BUN/Creatinine Ratio: 17 (calc) (ref 6–22)
BUN: 25 mg/dL (ref 7–25)
CO2: 26 mmol/L (ref 20–32)
Calcium: 10.3 mg/dL (ref 8.6–10.4)
Chloride: 104 mmol/L (ref 98–110)
Creat: 1.44 mg/dL — ABNORMAL HIGH (ref 0.60–0.93)
Glucose, Bld: 110 mg/dL — ABNORMAL HIGH (ref 65–99)
Potassium: 4.6 mmol/L (ref 3.5–5.3)
Sodium: 140 mmol/L (ref 135–146)

## 2019-12-12 LAB — HEPATIC FUNCTION PANEL
AG Ratio: 1.4 (calc) (ref 1.0–2.5)
ALT: 14 U/L (ref 6–29)
AST: 14 U/L (ref 10–35)
Albumin: 4.4 g/dL (ref 3.6–5.1)
Alkaline phosphatase (APISO): 92 U/L (ref 37–153)
Bilirubin, Direct: 0.1 mg/dL (ref 0.0–0.2)
Globulin: 3.2 g/dL (calc) (ref 1.9–3.7)
Indirect Bilirubin: 0.3 mg/dL (calc) (ref 0.2–1.2)
Total Bilirubin: 0.4 mg/dL (ref 0.2–1.2)
Total Protein: 7.6 g/dL (ref 6.1–8.1)

## 2019-12-12 LAB — HEMOGLOBIN A1C
Hgb A1c MFr Bld: 5.9 % of total Hgb — ABNORMAL HIGH (ref <5.7)
Mean Plasma Glucose: 123 (calc)
eAG (mmol/L): 6.8 (calc)

## 2019-12-12 LAB — VALPROIC ACID LEVEL: Valproic Acid Lvl: 65.7 mg/L (ref 50.0–100.0)

## 2019-12-12 LAB — TSH: TSH: 0.44 mIU/L (ref 0.40–4.50)

## 2019-12-12 MED ORDER — CIPROFLOXACIN HCL 500 MG PO TABS
500.0000 mg | ORAL_TABLET | Freq: Two times a day (BID) | ORAL | 0 refills | Status: AC
Start: 2019-12-12 — End: 2019-12-22

## 2019-12-15 ENCOUNTER — Encounter: Payer: Self-pay | Admitting: Internal Medicine

## 2019-12-15 NOTE — Assessment & Plan Note (Signed)
Stable, for depakote leve

## 2019-12-15 NOTE — Assessment & Plan Note (Signed)
stable overall by history and exam, recent data reviewed with pt, and pt to continue medical treatment as before,  to f/u any worsening symptoms or concerns  

## 2019-12-15 NOTE — Assessment & Plan Note (Addendum)
stable overall by history and exam, recent data reviewed with pt, and pt to continue medical treatment as before,  to f/u any worsening symptoms or concerns  I spent 31 minutes in preparing to see the patient by review of recent labs, imaging and procedures, obtaining and reviewing separately obtained history, communicating with the patient and family or caregiver, ordering medications, tests or procedures, and documenting clinical information in the EHR including the differential Dx, treatment, and any further evaluation and other management of ckd, htn, hld, preDM, rash, bipolar

## 2019-12-15 NOTE — Assessment & Plan Note (Signed)
For steroid cream prn asd

## 2019-12-23 ENCOUNTER — Other Ambulatory Visit: Payer: Self-pay | Admitting: Internal Medicine

## 2019-12-23 DIAGNOSIS — E782 Mixed hyperlipidemia: Secondary | ICD-10-CM

## 2019-12-23 NOTE — Telephone Encounter (Signed)
Please refill as per office routine med refill policy (all routine meds refilled for 3 mo or monthly per pt preference up to one year from last visit, then month to month grace period for 3 mo, then further med refills will have to be denied)  

## 2020-01-12 DIAGNOSIS — H18593 Other hereditary corneal dystrophies, bilateral: Secondary | ICD-10-CM | POA: Diagnosis not present

## 2020-01-12 DIAGNOSIS — R7303 Prediabetes: Secondary | ICD-10-CM | POA: Diagnosis not present

## 2020-01-12 DIAGNOSIS — H524 Presbyopia: Secondary | ICD-10-CM | POA: Diagnosis not present

## 2020-01-12 DIAGNOSIS — H43813 Vitreous degeneration, bilateral: Secondary | ICD-10-CM | POA: Diagnosis not present

## 2020-01-12 LAB — HM DIABETES EYE EXAM

## 2020-01-19 ENCOUNTER — Encounter: Payer: Self-pay | Admitting: Internal Medicine

## 2020-01-27 DIAGNOSIS — F3131 Bipolar disorder, current episode depressed, mild: Secondary | ICD-10-CM | POA: Diagnosis not present

## 2020-01-29 ENCOUNTER — Other Ambulatory Visit: Payer: Self-pay | Admitting: Internal Medicine

## 2020-02-10 ENCOUNTER — Other Ambulatory Visit: Payer: Self-pay | Admitting: Internal Medicine

## 2020-02-10 NOTE — Telephone Encounter (Signed)
Please refill as per office routine med refill policy (all routine meds refilled for 3 mo or monthly per pt preference up to one year from last visit, then month to month grace period for 3 mo, then further med refills will have to be denied)  

## 2020-02-12 DIAGNOSIS — Z23 Encounter for immunization: Secondary | ICD-10-CM | POA: Diagnosis not present

## 2020-02-15 ENCOUNTER — Other Ambulatory Visit: Payer: Self-pay | Admitting: Internal Medicine

## 2020-02-15 NOTE — Telephone Encounter (Signed)
Please refill as per office routine med refill policy (all routine meds refilled for 3 mo or monthly per pt preference up to one year from last visit, then month to month grace period for 3 mo, then further med refills will have to be denied)  

## 2020-02-24 ENCOUNTER — Encounter: Payer: Medicare Other | Admitting: Gastroenterology

## 2020-03-22 DIAGNOSIS — Z23 Encounter for immunization: Secondary | ICD-10-CM | POA: Diagnosis not present

## 2020-04-16 ENCOUNTER — Other Ambulatory Visit: Payer: Self-pay | Admitting: Internal Medicine

## 2020-04-16 DIAGNOSIS — Z1231 Encounter for screening mammogram for malignant neoplasm of breast: Secondary | ICD-10-CM

## 2020-05-04 ENCOUNTER — Other Ambulatory Visit: Payer: Self-pay | Admitting: Internal Medicine

## 2020-05-04 NOTE — Telephone Encounter (Signed)
Please refill as per office routine med refill policy (all routine meds refilled for 3 mo or monthly per pt preference up to one year from last visit, then month to month grace period for 3 mo, then further med refills will have to be denied)  

## 2020-05-27 ENCOUNTER — Other Ambulatory Visit: Payer: Self-pay

## 2020-05-27 ENCOUNTER — Ambulatory Visit
Admission: RE | Admit: 2020-05-27 | Discharge: 2020-05-27 | Disposition: A | Discharge status: Home / Self Care | Payer: Medicare Other | Source: Ambulatory Visit | Attending: Internal Medicine | Admitting: Internal Medicine

## 2020-05-27 DIAGNOSIS — Z1231 Encounter for screening mammogram for malignant neoplasm of breast: Secondary | ICD-10-CM | POA: Diagnosis not present

## 2020-06-14 ENCOUNTER — Ambulatory Visit (INDEPENDENT_AMBULATORY_CARE_PROVIDER_SITE_OTHER): Payer: Medicare Other | Admitting: Internal Medicine

## 2020-06-14 ENCOUNTER — Other Ambulatory Visit: Payer: Self-pay

## 2020-06-14 ENCOUNTER — Encounter: Payer: Self-pay | Admitting: Internal Medicine

## 2020-06-14 VITALS — BP 134/70 | HR 80 | Temp 98.4°F | Ht 64.0 in | Wt 183.4 lb

## 2020-06-14 DIAGNOSIS — I10 Essential (primary) hypertension: Secondary | ICD-10-CM | POA: Diagnosis not present

## 2020-06-14 DIAGNOSIS — N183 Chronic kidney disease, stage 3 unspecified: Secondary | ICD-10-CM

## 2020-06-14 DIAGNOSIS — J449 Chronic obstructive pulmonary disease, unspecified: Secondary | ICD-10-CM | POA: Diagnosis not present

## 2020-06-14 DIAGNOSIS — R7303 Prediabetes: Secondary | ICD-10-CM

## 2020-06-14 DIAGNOSIS — E782 Mixed hyperlipidemia: Secondary | ICD-10-CM

## 2020-06-14 LAB — CBC WITH DIFFERENTIAL/PLATELET
Basophils Absolute: 0.1 10*3/uL (ref 0.0–0.1)
Basophils Relative: 0.7 % (ref 0.0–3.0)
Eosinophils Absolute: 0.6 10*3/uL (ref 0.0–0.7)
Eosinophils Relative: 7.1 % — ABNORMAL HIGH (ref 0.0–5.0)
HCT: 41.8 % (ref 36.0–46.0)
Hemoglobin: 13.7 g/dL (ref 12.0–15.0)
Lymphocytes Relative: 24 % (ref 12.0–46.0)
Lymphs Abs: 2 10*3/uL (ref 0.7–4.0)
MCHC: 32.7 g/dL (ref 30.0–36.0)
MCV: 98 fl (ref 78.0–100.0)
Monocytes Absolute: 0.7 10*3/uL (ref 0.1–1.0)
Monocytes Relative: 8.3 % (ref 3.0–12.0)
Neutro Abs: 5 10*3/uL (ref 1.4–7.7)
Neutrophils Relative %: 59.9 % (ref 43.0–77.0)
Platelets: 196 10*3/uL (ref 150.0–400.0)
RBC: 4.26 Mil/uL (ref 3.87–5.11)
RDW: 12.2 % (ref 11.5–15.5)
WBC: 8.3 10*3/uL (ref 4.0–10.5)

## 2020-06-14 LAB — URINALYSIS, ROUTINE W REFLEX MICROSCOPIC
Bilirubin Urine: NEGATIVE
Ketones, ur: NEGATIVE
Nitrite: NEGATIVE
RBC / HPF: NONE SEEN (ref 0–?)
Specific Gravity, Urine: 1.025 (ref 1.000–1.030)
Total Protein, Urine: NEGATIVE
Urine Glucose: NEGATIVE
Urobilinogen, UA: 0.2 (ref 0.0–1.0)
pH: 6 (ref 5.0–8.0)

## 2020-06-14 LAB — VITAMIN D 25 HYDROXY (VIT D DEFICIENCY, FRACTURES): VITD: 79.98 ng/mL (ref 30.00–100.00)

## 2020-06-14 LAB — BASIC METABOLIC PANEL
BUN: 28 mg/dL — ABNORMAL HIGH (ref 6–23)
CO2: 30 mEq/L (ref 19–32)
Calcium: 10.1 mg/dL (ref 8.4–10.5)
Chloride: 104 mEq/L (ref 96–112)
Creatinine, Ser: 1.33 mg/dL — ABNORMAL HIGH (ref 0.40–1.20)
GFR: 39.25 mL/min — ABNORMAL LOW (ref >60.00)
Glucose, Bld: 121 mg/dL — ABNORMAL HIGH (ref 70–99)
Potassium: 4.2 mEq/L (ref 3.5–5.1)
Sodium: 139 mEq/L (ref 135–145)

## 2020-06-14 MED ORDER — FAMOTIDINE 20 MG PO TABS: ORAL_TABLET | ORAL | 0 refills | Status: DC

## 2020-06-14 NOTE — Patient Instructions (Signed)

## 2020-06-14 NOTE — Progress Notes (Signed)
Established Patient Office Visit  Subjective:  Patient ID: Paula [name], female    DOB: 1945-07-28  Age: 75 y.o. MRN: [id number]  CC:  Chief Complaint  Patient presents with  . Follow-up    6 month f/u HTN, Diabetes, cholesterol and meds.  C/o having pain in her neck x 1 week.           HPI:  Paula [name] is a 75 y.o. female here to f/u above; BP controlled as today at home, and CBGs have been < 100 almost completely.   Also with c/o bilateral post mild lower neck pain for 1 wk, without radiation, intermittent, worse to turn head left and right, better to not do this, does not necessarily think she need further med tx for this.  Pt denies chest pain, increased sob or doe, wheezing, orthopnea, PND, increased LE swelling, palpitations, dizziness or syncope.  Pt denies new neurological symptoms such as new headache, or facial or extremity weakness or numbness   Pt denies polydipsia, polyuria, Wt Readings from Last 3 Encounters:  06/14/20 183 lb 6.4 oz (83.2 kg)  12/11/19 174 lb (78.9 kg)  12/11/19 174 lb (78.9 kg)   BP Readings from Last 3 Encounters:  06/14/20 134/70  12/11/19 110/70  12/11/19 110/70     Declines colonoscopy for now due to pandemic   Past Medical History:  Diagnosis Date  . Allergy   . Arthritis   . Asthma   . Bipolar 1 disorder ([place])   . Colon polyps   . Depression   . Diabetes mellitus without complication ([place])   . GERD (gastroesophageal reflux disease)   . History of alcohol abuse   . History of chicken pox   . History of drug abuse ([place])   . Hyperlipidemia   . Hypertension   . Urine incontinence    Past Surgical History:  Procedure Laterality Date  . APPENDECTOMY    . BREAST BIOPSY    . CHOLECYSTECTOMY    . LUNG BIOPSY    . SUBTOTAL COLECTOMY      reports that she quit smoking about 3 years ago. Her smoking use included cigarettes. She has a 55.00 pack-year smoking history. She has never used smokeless tobacco. She reports current alcohol  use of about 4.0 - 6.0 standard drinks of alcohol per week. She reports current drug use. Frequency: 7.00 times per week. Drug: Marijuana. family history includes Arthritis in her father; COPD in her mother; Diabetes in her father; Heart disease in her father, paternal grandmother, and paternal uncle; Hyperlipidemia in her mother. Allergies  Allergen Reactions  . Penicillins    Current Outpatient Medications on File Prior to Visit  Medication Sig Dispense Refill  . amLODipine (NORVASC) 5 MG tablet TAKE 1 TABLET BY MOUTH EVERY DAY 90 tablet 1  . aspirin EC 81 MG tablet Take 81 mg by mouth daily.    . Cholecalciferol (VITAMIN D3) 2000 units TABS Take by mouth.    . divalproex (DEPAKOTE ER) 250 MG 24 hr tablet Take 750 mg by mouth every morning.  4  . FLUoxetine (PROZAC) 10 MG capsule Take 10 mg by mouth every morning.  2  . glucose blood (FREESTYLE LITE) test strip TEST ONCE A DAY. (DX: E11.9) 100 each 3  . multivitamin-iron-minerals-folic acid (CENTRUM) chewable tablet Chew 1 tablet by mouth daily.    . Omega-3 Fatty Acids (FISH OIL) 1000 MG CAPS Take 2 capsules by mouth daily.    . QUEtiapine (SEROQUEL)  50 MG tablet Take 1 tablet by mouth 2 (two) times daily.   3  . SODIUM FLUORIDE 5000 PPM 1.1 % PSTE USE PEA SIZED AMOUNT & BRUSH DAILY X 2 MINS, SPIT. DO NOT RINSE OR EAT FOR 30 MINS    . tolterodine (DETROL LA) 4 MG 24 hr capsule TAKE 1 CAPSULE BY MOUTH EVERY DAY 90 capsule 3  . zolpidem (AMBIEN) 10 MG tablet Take 5 mg by mouth at bedtime as needed for sleep.      No current facility-administered medications on file prior to visit.        ROS:  All others reviewed and negative.  Objective        PE:  BP 134/70   Pulse 80   Temp 98.4 F (36.9 C) (Oral)   Ht 5\' 4"  (1.626 m)   Wt 183 lb 6.4 oz (83.2 kg)   SpO2 95%   BMI 31.48 kg/m                 Constitutional: Pt appears in NAD               HENT: Head: NCAT.                Right Ear: External ear normal.                 Left  Ear: External ear normal.                Eyes: . Pupils are equal, round, and reactive to light. Conjunctivae and EOM are normal               Nose: without d/c or deformity               Neck: Neck supple. Gross normal ROM               Cardiovascular: Normal rate and regular rhythm.                 Pulmonary/Chest: Effort normal and breath sounds without rales or wheezing.                Abd:  Soft, NT, ND, + BS, no organomegaly               Neurological: Pt is alert. At baseline orientation, motor grossly intact               Skin: Skin is warm. No rashes, no other new lesions, LE edema - none               Psychiatric: Pt behavior is normal without agitation   Assessment/Plan:  Paula [name] is a 75 y.o. White or Caucasian [1] female with  has a past medical history of Allergy, Arthritis, Asthma, Bipolar 1 disorder ([place]), Colon polyps, Depression, Diabetes mellitus without complication ([place]), GERD (gastroesophageal reflux disease), History of alcohol abuse, History of chicken pox, History of drug abuse ([place]), Hyperlipidemia, Hypertension, and Urine incontinence.   Assessment Plan  See problem oriented assessment and plan Labs reviewed for each problem: Lab Results  Component Value Date   WBC 8.3 06/14/2020   HGB 13.7 06/14/2020   HCT 41.8 06/14/2020   PLT 196.0 06/14/2020   GLUCOSE 121 (H) 06/14/2020   CHOL 164 12/11/2019   TRIG 120 12/11/2019   HDL 60 12/11/2019   LDLDIRECT 83.0 12/02/2018   LDLCALC 82 12/11/2019   ALT 14 12/11/2019   AST 14 12/11/2019   NA  139 06/14/2020   K 4.2 06/14/2020   CL 104 06/14/2020   CREATININE 1.33 (H) 06/14/2020   BUN 28 (H) 06/14/2020   CO2 30 06/14/2020   TSH 0.44 12/11/2019   HGBA1C 5.9 (H) 12/11/2019   MICROALBUR 1.3 03/13/2018    Micro: none  Cardiac tracings I have personally interpreted today:  none  Pertinent Radiological findings (summarize):  12 23 2021 mammgram    There are no preventive care reminders to display for  this patient.  There are no preventive care reminders to display for this patient.  Lab Results  Component Value Date   TSH 0.44 12/11/2019   Lab Results  Component Value Date   WBC 8.3 06/14/2020   HGB 13.7 06/14/2020   HCT 41.8 06/14/2020   MCV 98.0 06/14/2020   PLT 196.0 06/14/2020   Lab Results  Component Value Date   NA 139 06/14/2020   K 4.2 06/14/2020   CO2 30 06/14/2020   GLUCOSE 121 (H) 06/14/2020   BUN 28 (H) 06/14/2020   CREATININE 1.33 (H) 06/14/2020   BILITOT 0.4 12/11/2019   ALKPHOS 76 12/02/2018   AST 14 12/11/2019   ALT 14 12/11/2019   PROT 7.6 12/11/2019   ALBUMIN 4.1 12/02/2018   CALCIUM 10.1 06/14/2020   CALCIUM 13.1 (HH) 06/14/2020   ANIONGAP 12 07/31/2017   GFR 39.25 (L) 06/14/2020   Lab Results  Component Value Date   CHOL 164 12/11/2019   Lab Results  Component Value Date   HDL 60 12/11/2019   Lab Results  Component Value Date   LDLCALC 82 12/11/2019   Lab Results  Component Value Date   TRIG 120 12/11/2019   Lab Results  Component Value Date   CHOLHDL 2.7 12/11/2019   Lab Results  Component Value Date   HGBA1C 5.9 (H) 12/11/2019      Assessment & Plan:   Problem List Items Addressed This Visit      High   COPD  GOLD 0  - Primary    Stable, cont current med tx - declines albut inhaler    Current Outpatient Medications (Cardiovascular):  .  amLODipine (NORVASC) 5 MG tablet, TAKE 1 TABLET BY MOUTH EVERY DAY .  ezetimibe (ZETIA) 10 MG tablet, TAKE 1 TABLET BY MOUTH EVERY DAY .  pravastatin (PRAVACHOL) 40 MG tablet, TAKE 1 TABLET BY MOUTH EVERY DAY   Current Outpatient Medications (Analgesics):  .  aspirin EC 81 MG tablet, Take 81 mg by mouth daily.   Current Outpatient Medications (Other):  Marland Kitchen  Cholecalciferol (VITAMIN D3) 2000 units TABS, Take by mouth. .  divalproex (DEPAKOTE ER) 250 MG 24 hr tablet, Take 750 mg by mouth every morning. Marland Kitchen  FLUoxetine (PROZAC) 10 MG capsule, Take 10 mg by mouth every morning. Marland Kitchen   glucose blood (FREESTYLE LITE) test strip, TEST ONCE A DAY. (DX: E11.9) .  multivitamin-iron-minerals-folic acid (CENTRUM) chewable tablet, Chew 1 tablet by mouth daily. .  Omega-3 Fatty Acids (FISH OIL) 1000 MG CAPS, Take 2 capsules by mouth daily. .  QUEtiapine (SEROQUEL) 50 MG tablet, Take 1 tablet by mouth 2 (two) times daily.  .  SODIUM FLUORIDE 5000 PPM 1.1 % PSTE, USE PEA SIZED AMOUNT & BRUSH DAILY X 2 MINS, SPIT. DO NOT RINSE OR EAT FOR 30 MINS .  tolterodine (DETROL LA) 4 MG 24 hr capsule, TAKE 1 CAPSULE BY MOUTH EVERY DAY .  zolpidem (AMBIEN) 10 MG tablet, Take 5 mg by mouth at bedtime as needed for sleep.  Marland Kitchen  famotidine (PEPCID) 20 MG tablet, TAKE 1 TABLET BY MOUTH TWICE A DAY **NEED OFFICE VISIT** .  triamcinolone (KENALOG) 0.1 %, APPLY TO AFFECTED AREA TWICE A DAY         Medium   Pre-diabetes    Lab Results  Component Value Date   HGBA1C 5.9 (H) 12/11/2019   Stable, pt to continue current medical treatment  - diet control      Mixed hyperlipidemia    Lab Results  Component Value Date   LDLCALC 82 12/11/2019   Stable, pt to continue current statin pravachol 40   Current Outpatient Medications (Cardiovascular):  .  amLODipine (NORVASC) 5 MG tablet, TAKE 1 TABLET BY MOUTH EVERY DAY .  ezetimibe (ZETIA) 10 MG tablet, TAKE 1 TABLET BY MOUTH EVERY DAY .  pravastatin (PRAVACHOL) 40 MG tablet, TAKE 1 TABLET BY MOUTH EVERY DAY   Current Outpatient Medications (Analgesics):  .  aspirin EC 81 MG tablet, Take 81 mg by mouth daily.   Current Outpatient Medications (Other):  Marland Kitchen  Cholecalciferol (VITAMIN D3) 2000 units TABS, Take by mouth. .  divalproex (DEPAKOTE ER) 250 MG 24 hr tablet, Take 750 mg by mouth every morning. Marland Kitchen  FLUoxetine (PROZAC) 10 MG capsule, Take 10 mg by mouth every morning. Marland Kitchen  glucose blood (FREESTYLE LITE) test strip, TEST ONCE A DAY. (DX: E11.9) .  multivitamin-iron-minerals-folic acid (CENTRUM) chewable tablet, Chew 1 tablet by mouth daily. .   Omega-3 Fatty Acids (FISH OIL) 1000 MG CAPS, Take 2 capsules by mouth daily. .  QUEtiapine (SEROQUEL) 50 MG tablet, Take 1 tablet by mouth 2 (two) times daily.  .  SODIUM FLUORIDE 5000 PPM 1.1 % PSTE, USE PEA SIZED AMOUNT & BRUSH DAILY X 2 MINS, SPIT. DO NOT RINSE OR EAT FOR 30 MINS .  tolterodine (DETROL LA) 4 MG 24 hr capsule, TAKE 1 CAPSULE BY MOUTH EVERY DAY .  zolpidem (AMBIEN) 10 MG tablet, Take 5 mg by mouth at bedtime as needed for sleep.  .  famotidine (PEPCID) 20 MG tablet, TAKE 1 TABLET BY MOUTH TWICE A DAY **NEED OFFICE VISIT** .  triamcinolone (KENALOG) 0.1 %, APPLY TO AFFECTED AREA TWICE A DAY       Essential hypertension    BP Readings from Last 3 Encounters:  06/14/20 134/70  12/11/19 110/70  12/11/19 110/70   Stable, pt to continue medical treatment norvasc      CKD (chronic kidney disease) stage 3, GFR 30-59 ml/min (HCC)    Lab Results  Component Value Date   CREATININE 1.33 (H) 06/14/2020   Stable overall, cont to avoid nephrotoxins      Relevant Orders   PTH, intact and calcium (Completed)   Basic metabolic panel (Completed)   Phosphorus (Completed)   VITAMIN D 25 Hydroxy (Vit-D Deficiency, Fractures) (Completed)   CBC with Differential/Platelet (Completed)   Urinalysis, Routine w reflex microscopic (Completed)      Meds ordered this encounter  Medications  . famotidine (PEPCID) 20 MG tablet    Sig: TAKE 1 TABLET BY MOUTH TWICE A DAY **NEED OFFICE VISIT**    Dispense:  180 tablet    Refill:  0    Follow-up: Return in about 6 months (around 12/12/2020).   Cathlean Cower, MD 06/14/2020 1:30 PM [facility]

## 2020-06-15 ENCOUNTER — Telehealth: Payer: Self-pay

## 2020-06-15 LAB — PTH, INTACT AND CALCIUM
Calcium: 13.1 mg/dL (ref 8.6–10.4)
PTH: 50 pg/mL (ref 14–64)

## 2020-06-15 LAB — PHOSPHORUS: Phosphorus: 4 mg/dL (ref 2.3–4.6)

## 2020-06-15 NOTE — Telephone Encounter (Signed)
I believe this was an error, since the BMP calcium testing done the very same day and time was normal. No need for further consideration, thanks

## 2020-06-15 NOTE — Telephone Encounter (Signed)
error 

## 2020-06-15 NOTE — Telephone Encounter (Signed)
CRITICAL VALUE STICKER  CRITICAL VALUE: Calcium 13.1  RECEIVER (on-site recipient of call): Elza Rafter [facility]  DATE & TIME NOTIFIED: 06/15/20 at 54  MESSENGER (representative from lab):  MD NOTIFIED: [name]  TIME OF NOTIFICATION:1625  RESPONSE: Awaiting response

## 2020-06-19 ENCOUNTER — Other Ambulatory Visit: Payer: Self-pay | Admitting: Internal Medicine

## 2020-06-19 DIAGNOSIS — E782 Mixed hyperlipidemia: Secondary | ICD-10-CM

## 2020-06-21 ENCOUNTER — Encounter: Payer: Self-pay | Admitting: Internal Medicine

## 2020-06-21 NOTE — Assessment & Plan Note (Signed)
Lab Results  Component Value Date   HGBA1C 5.9 (H) 12/11/2019   Stable, pt to continue current medical treatment  - diet control

## 2020-06-21 NOTE — Assessment & Plan Note (Signed)
BP Readings from Last 3 Encounters:  06/14/20 134/70  12/11/19 110/70  12/11/19 110/70   Stable, pt to continue medical treatment norvasc

## 2020-06-21 NOTE — Assessment & Plan Note (Signed)
Stable, cont current med tx - declines albut inhaler    Current Outpatient Medications (Cardiovascular):  .  amLODipine (NORVASC) 5 MG tablet, TAKE 1 TABLET BY MOUTH EVERY DAY .  ezetimibe (ZETIA) 10 MG tablet, TAKE 1 TABLET BY MOUTH EVERY DAY .  pravastatin (PRAVACHOL) 40 MG tablet, TAKE 1 TABLET BY MOUTH EVERY DAY   Current Outpatient Medications (Analgesics):  .  aspirin EC 81 MG tablet, Take 81 mg by mouth daily.   Current Outpatient Medications (Other):  Marland Kitchen  Cholecalciferol (VITAMIN D3) 2000 units TABS, Take by mouth. .  divalproex (DEPAKOTE ER) 250 MG 24 hr tablet, Take 750 mg by mouth every morning. Marland Kitchen  FLUoxetine (PROZAC) 10 MG capsule, Take 10 mg by mouth every morning. Marland Kitchen  glucose blood (FREESTYLE LITE) test strip, TEST ONCE A DAY. (DX: E11.9) .  multivitamin-iron-minerals-folic acid (CENTRUM) chewable tablet, Chew 1 tablet by mouth daily. .  Omega-3 Fatty Acids (FISH OIL) 1000 MG CAPS, Take 2 capsules by mouth daily. .  QUEtiapine (SEROQUEL) 50 MG tablet, Take 1 tablet by mouth 2 (two) times daily.  .  SODIUM FLUORIDE 5000 PPM 1.1 % PSTE, USE PEA SIZED AMOUNT & BRUSH DAILY X 2 MINS, SPIT. DO NOT RINSE OR EAT FOR 30 MINS .  tolterodine (DETROL LA) 4 MG 24 hr capsule, TAKE 1 CAPSULE BY MOUTH EVERY DAY .  zolpidem (AMBIEN) 10 MG tablet, Take 5 mg by mouth at bedtime as needed for sleep.  .  famotidine (PEPCID) 20 MG tablet, TAKE 1 TABLET BY MOUTH TWICE A DAY **NEED OFFICE VISIT** .  triamcinolone (KENALOG) 0.1 %, APPLY TO AFFECTED AREA TWICE A DAY

## 2020-06-21 NOTE — Assessment & Plan Note (Signed)
Lab Results  Component Value Date   CREATININE 1.33 (H) 06/14/2020   Stable overall, cont to avoid nephrotoxins

## 2020-06-21 NOTE — Assessment & Plan Note (Signed)
Lab Results  Component Value Date   LDLCALC 82 12/11/2019   Stable, pt to continue current statin pravachol 40   Current Outpatient Medications (Cardiovascular):  .  amLODipine (NORVASC) 5 MG tablet, TAKE 1 TABLET BY MOUTH EVERY DAY .  ezetimibe (ZETIA) 10 MG tablet, TAKE 1 TABLET BY MOUTH EVERY DAY .  pravastatin (PRAVACHOL) 40 MG tablet, TAKE 1 TABLET BY MOUTH EVERY DAY   Current Outpatient Medications (Analgesics):  .  aspirin EC 81 MG tablet, Take 81 mg by mouth daily.   Current Outpatient Medications (Other):  Marland Kitchen  Cholecalciferol (VITAMIN D3) 2000 units TABS, Take by mouth. .  divalproex (DEPAKOTE ER) 250 MG 24 hr tablet, Take 750 mg by mouth every morning. Marland Kitchen  FLUoxetine (PROZAC) 10 MG capsule, Take 10 mg by mouth every morning. Marland Kitchen  glucose blood (FREESTYLE LITE) test strip, TEST ONCE A DAY. (DX: E11.9) .  multivitamin-iron-minerals-folic acid (CENTRUM) chewable tablet, Chew 1 tablet by mouth daily. .  Omega-3 Fatty Acids (FISH OIL) 1000 MG CAPS, Take 2 capsules by mouth daily. .  QUEtiapine (SEROQUEL) 50 MG tablet, Take 1 tablet by mouth 2 (two) times daily.  .  SODIUM FLUORIDE 5000 PPM 1.1 % PSTE, USE PEA SIZED AMOUNT & BRUSH DAILY X 2 MINS, SPIT. DO NOT RINSE OR EAT FOR 30 MINS .  tolterodine (DETROL LA) 4 MG 24 hr capsule, TAKE 1 CAPSULE BY MOUTH EVERY DAY .  zolpidem (AMBIEN) 10 MG tablet, Take 5 mg by mouth at bedtime as needed for sleep.  .  famotidine (PEPCID) 20 MG tablet, TAKE 1 TABLET BY MOUTH TWICE A DAY **NEED OFFICE VISIT** .  triamcinolone (KENALOG) 0.1 %, APPLY TO AFFECTED AREA TWICE A DAY

## 2020-06-24 DIAGNOSIS — F3131 Bipolar disorder, current episode depressed, mild: Secondary | ICD-10-CM | POA: Diagnosis not present

## 2020-08-09 ENCOUNTER — Other Ambulatory Visit: Payer: Self-pay | Admitting: Internal Medicine

## 2020-08-09 NOTE — Telephone Encounter (Signed)
Please refill as per office routine med refill policy (all routine meds refilled for 3 mo or monthly per pt preference up to one year from last visit, then month to month grace period for 3 mo, then further med refills will have to be denied)  

## 2020-09-21 DIAGNOSIS — Z23 Encounter for immunization: Secondary | ICD-10-CM | POA: Diagnosis not present

## 2020-09-28 ENCOUNTER — Other Ambulatory Visit: Payer: Self-pay | Admitting: Internal Medicine

## 2020-10-12 ENCOUNTER — Ambulatory Visit (INDEPENDENT_AMBULATORY_CARE_PROVIDER_SITE_OTHER): Payer: Medicare Other | Admitting: Internal Medicine

## 2020-10-12 ENCOUNTER — Encounter: Payer: Self-pay | Admitting: Internal Medicine

## 2020-10-12 ENCOUNTER — Other Ambulatory Visit: Payer: Self-pay

## 2020-10-12 VITALS — BP 118/60 | HR 77 | Temp 98.1°F | Ht 64.0 in | Wt 179.0 lb

## 2020-10-12 DIAGNOSIS — R011 Cardiac murmur, unspecified: Secondary | ICD-10-CM

## 2020-10-12 DIAGNOSIS — M542 Cervicalgia: Secondary | ICD-10-CM | POA: Diagnosis not present

## 2020-10-12 DIAGNOSIS — N183 Chronic kidney disease, stage 3 unspecified: Secondary | ICD-10-CM

## 2020-10-12 LAB — VITAMIN D 25 HYDROXY (VIT D DEFICIENCY, FRACTURES): VITD: 61.3 ng/mL (ref 30.00–100.00)

## 2020-10-12 MED ORDER — TRAMADOL HCL 50 MG PO TABS
50.0000 mg | ORAL_TABLET | Freq: Four times a day (QID) | ORAL | 0 refills | Status: DC | PRN
Start: 2020-10-12 — End: 2020-10-29

## 2020-10-12 MED ORDER — TIZANIDINE HCL 2 MG PO TABS: 2.0000 mg | ORAL_TABLET | Freq: Four times a day (QID) | ORAL | 2 refills | Status: DC | PRN

## 2020-10-12 MED ORDER — GABAPENTIN 100 MG PO CAPS: 100.0000 mg | ORAL_CAPSULE | Freq: Three times a day (TID) | ORAL | 3 refills | Status: DC

## 2020-10-12 NOTE — Progress Notes (Signed)
Patient ID: Paula [name], female   DOB: Jan 01, 1946, 75 y.o.   MRN: [id number]        Chief Complaint: right occiput pain       HPI:  Paula [name] is a 75 y.o. female here with c/o right upper back/neck/occiput pain mild to mod, intermittent but almost constant, sharp but occasoinally burning, without other radiation or radicular symptoms, worse to palpate or move the head horizontally left and right, nothing else seems to make better or worse.  Pt denies chest pain, increased sob or doe, wheezing, orthopnea, PND, increased LE swelling, palpitations, dizziness or syncope.   Pt denies polydipsia, polyuria, or focal new neuro s/s.   Pt denies fever, wt loss, night sweats, loss of appetite, or other constitutional symptoms  No trauma or falls.         Wt Readings from Last 3 Encounters:  10/12/20 179 lb (81.2 kg)  06/14/20 183 lb 6.4 oz (83.2 kg)  12/11/19 174 lb (78.9 kg)   BP Readings from Last 3 Encounters:  10/12/20 118/60  06/14/20 134/70  12/11/19 110/70         Past Medical History:  Diagnosis Date  . Allergy   . Arthritis   . Asthma   . Bipolar 1 disorder ([place])   . Colon polyps   . Depression   . Diabetes mellitus without complication ([place])   . GERD (gastroesophageal reflux disease)   . History of alcohol abuse   . History of chicken pox   . History of drug abuse ([place])   . Hyperlipidemia   . Hypertension   . Urine incontinence    Past Surgical History:  Procedure Laterality Date  . APPENDECTOMY    . BREAST BIOPSY    . CHOLECYSTECTOMY    . LUNG BIOPSY    . SUBTOTAL COLECTOMY      reports that she quit smoking about 4 years ago. Her smoking use included cigarettes. She has a 55.00 pack-year smoking history. She has never used smokeless tobacco. She reports current alcohol use of about 4.0 - 6.0 standard drinks of alcohol per week. She reports current drug use. Frequency: 7.00 times per week. Drug: Marijuana. family history includes Arthritis in her father; COPD in  her mother; Diabetes in her father; Heart disease in her father, paternal grandmother, and paternal uncle; Hyperlipidemia in her mother. Allergies  Allergen Reactions  . Penicillins    Current Outpatient Medications on File Prior to Visit  Medication Sig Dispense Refill  . amLODipine (NORVASC) 5 MG tablet TAKE 1 TABLET BY MOUTH EVERY DAY 90 tablet 1  . aspirin EC 81 MG tablet Take 81 mg by mouth daily.    . Cholecalciferol (VITAMIN D3) 2000 units TABS Take by mouth.    . divalproex (DEPAKOTE ER) 250 MG 24 hr tablet Take 750 mg by mouth every morning.  4  . ezetimibe (ZETIA) 10 MG tablet TAKE 1 TABLET BY MOUTH EVERY DAY 90 tablet 1  . famotidine (PEPCID) 20 MG tablet TAKE 1 TABLET BY MOUTH TWICE A DAY **NEED OFFICE VISIT** 180 tablet 0  . FLUoxetine (PROZAC) 10 MG capsule Take 10 mg by mouth every morning.  2  . FREESTYLE LITE test strip TEST ONCE A DAY. (DX: E11.9) 100 strip 3  . multivitamin-iron-minerals-folic acid (CENTRUM) chewable tablet Chew 1 tablet by mouth daily.    . Omega-3 Fatty Acids (FISH OIL) 1000 MG CAPS Take 2 capsules by mouth daily.    . pravastatin (PRAVACHOL) 40  MG tablet TAKE 1 TABLET BY MOUTH EVERY DAY 90 tablet 1  . QUEtiapine (SEROQUEL) 50 MG tablet Take 1 tablet by mouth 2 (two) times daily.   3  . SODIUM FLUORIDE 5000 PPM 1.1 % PSTE USE PEA SIZED AMOUNT & BRUSH DAILY X 2 MINS, SPIT. DO NOT RINSE OR EAT FOR 30 MINS    . tolterodine (DETROL LA) 4 MG 24 hr capsule TAKE 1 CAPSULE BY MOUTH EVERY DAY 90 capsule 3  . triamcinolone (KENALOG) 0.1 % APPLY TO AFFECTED AREA TWICE A DAY 30 g 1  . zolpidem (AMBIEN) 10 MG tablet Take 5 mg by mouth at bedtime as needed for sleep.      No current facility-administered medications on file prior to visit.        ROS:  All others reviewed and negative.  Objective        PE:  BP 118/60 (BP Location: Right Arm, Patient Position: Sitting, Cuff Size: Normal)   Pulse 77   Temp 98.1 F (36.7 C) (Oral)   Ht 5\' 4"  (1.626 m)   Wt 179  lb (81.2 kg)   SpO2 96%   BMI 30.73 kg/m                 Constitutional: Pt appears in NAD               HENT: Head: NCAT.                Right Ear: External ear normal.                 Left Ear: External ear normal.                Eyes: . Pupils are equal, round, and reactive to light. Conjunctivae and EOM are normal               Nose: without d/c or deformity                Neck: Neck supple. Gross normal ROM, tender right trapezoid and right occiput without swelling or rash               Cardiovascular: Normal rate and regular rhythm., has heart murmur RUSB gr 2/6               Pulmonary/Chest: Effort normal and breath sounds without rales or wheezing.                Abd:  Soft, NT, ND, + BS, no organomegaly               Neurological: Pt is alert. At baseline orientation, motor grossly intact               Skin: Skin is warm. No rashes, no other new lesions, LE edema - none               Psychiatric: Pt behavior is normal without agitation   Micro: none  Cardiac tracings I have personally interpreted today:  none  Pertinent Radiological findings (summarize): none   Lab Results  Component Value Date   WBC 8.3 06/14/2020   HGB 13.7 06/14/2020   HCT 41.8 06/14/2020   PLT 196.0 06/14/2020   GLUCOSE 121 (H) 06/14/2020   CHOL 164 12/11/2019   TRIG 120 12/11/2019   HDL 60 12/11/2019   LDLDIRECT 83.0 12/02/2018   LDLCALC 82 12/11/2019   ALT 14 12/11/2019   AST 14 12/11/2019   NA  139 06/14/2020   K 4.2 06/14/2020   CL 104 06/14/2020   CREATININE 1.33 (H) 06/14/2020   BUN 28 (H) 06/14/2020   CO2 30 06/14/2020   TSH 0.44 12/11/2019   HGBA1C 5.9 (H) 12/11/2019   MICROALBUR 1.3 03/13/2018   Assessment/Plan:  Paula [name] is a 75 y.o. White or Caucasian [1] female with  has a past medical history of Allergy, Arthritis, Asthma, Bipolar 1 disorder ([place]), Colon polyps, Depression, Diabetes mellitus without complication ([place]), GERD (gastroesophageal reflux disease), History of  alcohol abuse, History of chicken pox, History of drug abuse ([place]), Hyperlipidemia, Hypertension, and Urine incontinence.  Posterior neck pain ? msk strain vs occipital neuritis - for tizanidine prn, tramadol prn, and gabapentin asd trial,  to f/u any worsening symptoms or concerns  Hypercalcemia With last labs, asypt, for f/u lab  Heart murmur Noted on exam ,asympt, pt ok for echo   CKD (chronic kidney disease) stage 3, GFR 30-59 ml/min (HCC) Lab Results  Component Value Date   CREATININE 1.33 (H) 06/14/2020   Stable overall, cont to avoid nephrotoxins   Followup: Return in about 2 months (around 12/13/2020).  Cathlean Cower, MD 10/19/2020 9:48 PM [facility]

## 2020-10-12 NOTE — Patient Instructions (Signed)
Please take all new medication as prescribed - the tramadol for pain, the tizanidine for muscle relaxer, and gabapentin for nerve pain as needed  Please call for more tramadol if needed as we are only allowed to give #30 to start  Please continue all other medications as before, and refills have been done if requested.  Please have the pharmacy call with any other refills you may need.  Please keep your appointments with your specialists as you may have planned  You will be contacted regarding the referral for: Echocardiogram for the heart murmur  Please go to the LAB at the blood drawing area for the tests to be done - for the calcium level  You will be contacted by phone if any changes need to be made immediately.  Otherwise, you will receive a letter about your results with an explanation, but please check with MyChart first.  Please remember to sign up for MyChart if you have not done so, as this will be important to you in the future with finding out test results, communicating by private email, and scheduling acute appointments online when needed.

## 2020-10-15 LAB — PTH, INTACT AND CALCIUM
Calcium: 10.2 mg/dL (ref 8.6–10.4)
PTH: 43 pg/mL (ref 16–77)

## 2020-10-19 ENCOUNTER — Encounter: Payer: Self-pay | Admitting: Internal Medicine

## 2020-10-19 NOTE — Assessment & Plan Note (Signed)
?   msk strain vs occipital neuritis - for tizanidine prn, tramadol prn, and gabapentin asd trial,  to f/u any worsening symptoms or concerns

## 2020-10-19 NOTE — Assessment & Plan Note (Addendum)
Noted on exam ,asympt, pt ok for echo

## 2020-10-19 NOTE — Assessment & Plan Note (Signed)
With last labs, asypt, for f/u lab

## 2020-10-19 NOTE — Assessment & Plan Note (Signed)
Lab Results  Component Value Date   CREATININE 1.33 (H) 06/14/2020   Stable overall, cont to avoid nephrotoxins

## 2020-10-22 ENCOUNTER — Other Ambulatory Visit: Payer: Self-pay | Admitting: Internal Medicine

## 2020-10-22 NOTE — Telephone Encounter (Signed)
Please refill as per office routine med refill policy (all routine meds refilled for 3 mo or monthly per pt preference up to one year from last visit, then month to month grace period for 3 mo, then further med refills will have to be denied)  

## 2020-10-29 ENCOUNTER — Other Ambulatory Visit: Payer: Self-pay | Admitting: Internal Medicine

## 2020-11-11 ENCOUNTER — Ambulatory Visit (HOSPITAL_COMMUNITY): Payer: Medicare Other | Attending: Cardiology

## 2020-11-11 ENCOUNTER — Other Ambulatory Visit: Payer: Self-pay

## 2020-11-11 DIAGNOSIS — R011 Cardiac murmur, unspecified: Secondary | ICD-10-CM | POA: Insufficient documentation

## 2020-11-11 LAB — ECHOCARDIOGRAM COMPLETE
Area-P 1/2: 5.27 cm2
MV VTI: 2.47 cm2
S' Lateral: 2.5 cm

## 2020-11-12 ENCOUNTER — Encounter: Payer: Self-pay | Admitting: Internal Medicine

## 2020-11-22 DIAGNOSIS — F3131 Bipolar disorder, current episode depressed, mild: Secondary | ICD-10-CM | POA: Diagnosis not present

## 2020-12-12 ENCOUNTER — Other Ambulatory Visit: Payer: Self-pay | Admitting: Internal Medicine

## 2020-12-12 DIAGNOSIS — E782 Mixed hyperlipidemia: Secondary | ICD-10-CM

## 2020-12-12 NOTE — Telephone Encounter (Signed)
Please refill as per office routine med refill policy (all routine meds refilled for 3 mo or monthly per pt preference up to one year from last visit, then month to month grace period for 3 mo, then further med refills will have to be denied)  

## 2020-12-13 ENCOUNTER — Ambulatory Visit: Payer: Medicare Other | Admitting: Internal Medicine

## 2020-12-14 ENCOUNTER — Encounter: Payer: Self-pay | Admitting: Internal Medicine

## 2020-12-14 ENCOUNTER — Other Ambulatory Visit: Payer: Self-pay

## 2020-12-14 ENCOUNTER — Ambulatory Visit (INDEPENDENT_AMBULATORY_CARE_PROVIDER_SITE_OTHER): Payer: Medicare Other | Admitting: Internal Medicine

## 2020-12-14 VITALS — BP 126/78 | HR 85 | Temp 98.2°F | Ht 64.0 in | Wt 177.2 lb

## 2020-12-14 DIAGNOSIS — E782 Mixed hyperlipidemia: Secondary | ICD-10-CM | POA: Diagnosis not present

## 2020-12-14 DIAGNOSIS — F3181 Bipolar II disorder: Secondary | ICD-10-CM

## 2020-12-14 DIAGNOSIS — I10 Essential (primary) hypertension: Secondary | ICD-10-CM

## 2020-12-14 DIAGNOSIS — N1832 Chronic kidney disease, stage 3b: Secondary | ICD-10-CM

## 2020-12-14 DIAGNOSIS — I340 Nonrheumatic mitral (valve) insufficiency: Secondary | ICD-10-CM

## 2020-12-14 DIAGNOSIS — R7303 Prediabetes: Secondary | ICD-10-CM | POA: Diagnosis not present

## 2020-12-14 DIAGNOSIS — J449 Chronic obstructive pulmonary disease, unspecified: Secondary | ICD-10-CM

## 2020-12-14 LAB — CBC WITH DIFFERENTIAL/PLATELET
Basophils Absolute: 0.1 10*3/uL (ref 0.0–0.1)
Basophils Relative: 0.7 % (ref 0.0–3.0)
Eosinophils Absolute: 0.3 10*3/uL (ref 0.0–0.7)
Eosinophils Relative: 4 % (ref 0.0–5.0)
HCT: 37.4 % (ref 36.0–46.0)
Hemoglobin: 12.8 g/dL (ref 12.0–15.0)
Lymphocytes Relative: 33.5 % (ref 12.0–46.0)
Lymphs Abs: 2.5 10*3/uL (ref 0.7–4.0)
MCHC: 34.2 g/dL (ref 30.0–36.0)
MCV: 96.3 fl (ref 78.0–100.0)
Monocytes Absolute: 0.8 10*3/uL (ref 0.1–1.0)
Monocytes Relative: 10.9 % (ref 3.0–12.0)
Neutro Abs: 3.9 10*3/uL (ref 1.4–7.7)
Neutrophils Relative %: 50.9 % (ref 43.0–77.0)
Platelets: 203 10*3/uL (ref 150.0–400.0)
RBC: 3.89 Mil/uL (ref 3.87–5.11)
RDW: 12.1 % (ref 11.5–15.5)
WBC: 7.6 10*3/uL (ref 4.0–10.5)

## 2020-12-14 LAB — HEPATIC FUNCTION PANEL
ALT: 16 U/L (ref 0–35)
AST: 20 U/L (ref 0–37)
Albumin: 4.2 g/dL (ref 3.5–5.2)
Alkaline Phosphatase: 73 U/L (ref 39–117)
Bilirubin, Direct: 0.1 mg/dL (ref 0.0–0.3)
Total Bilirubin: 0.3 mg/dL (ref 0.2–1.2)
Total Protein: 7.5 g/dL (ref 6.0–8.3)

## 2020-12-14 LAB — LIPID PANEL
Cholesterol: 153 mg/dL (ref 0–200)
HDL: 56 mg/dL (ref >39.00)
LDL Cholesterol: 67 mg/dL (ref 0–99)
NonHDL: 96.67
Total CHOL/HDL Ratio: 3
Triglycerides: 147 mg/dL (ref 0.0–149.0)
VLDL: 29.4 mg/dL (ref 0.0–40.0)

## 2020-12-14 LAB — VITAMIN D 25 HYDROXY (VIT D DEFICIENCY, FRACTURES): VITD: 73.31 ng/mL (ref 30.00–100.00)

## 2020-12-14 LAB — BASIC METABOLIC PANEL
BUN: 29 mg/dL — ABNORMAL HIGH (ref 6–23)
CO2: 28 mEq/L (ref 19–32)
Calcium: 9.7 mg/dL (ref 8.4–10.5)
Chloride: 104 mEq/L (ref 96–112)
Creatinine, Ser: 1.38 mg/dL — ABNORMAL HIGH (ref 0.40–1.20)
GFR: 37.42 mL/min — ABNORMAL LOW (ref >60.00)
Glucose, Bld: 83 mg/dL (ref 70–99)
Potassium: 4.3 mEq/L (ref 3.5–5.1)
Sodium: 139 mEq/L (ref 135–145)

## 2020-12-14 LAB — MICROALBUMIN / CREATININE URINE RATIO
Creatinine,U: 91.2 mg/dL
Microalb Creat Ratio: 1 mg/g (ref 0.0–30.0)
Microalb, Ur: 0.9 mg/dL (ref 0.0–1.9)

## 2020-12-14 LAB — TSH: TSH: 0.45 u[IU]/mL (ref 0.35–5.50)

## 2020-12-14 LAB — PHOSPHORUS: Phosphorus: 4 mg/dL (ref 2.3–4.6)

## 2020-12-14 LAB — HEMOGLOBIN A1C: Hgb A1c MFr Bld: 6.1 % (ref 4.6–6.5)

## 2020-12-14 NOTE — Progress Notes (Signed)
Patient ID: Paula [name], female   DOB: December 23, 1945, 75 y.o.   MRN: [id number]        Chief Complaint: follow up HTN, HLD and hyperglycemia, ckd, mild MR and copd, bipolar d/o       HPI:  Paula [name] is a 75 y.o. female here with c/o above overall doing well, but has dental work upcoming and needs form filled out.  Pt denies chest pain, increased sob or doe, wheezing, orthopnea, PND, increased LE swelling, palpitations, dizziness or syncope.   Pt denies polydipsia, polyuria, or new focal neuro s/s.  Denies worsening depressive symptoms, suicidal ideation, or panic; has ongoing anxiety, needs depakote level per psychiatry.   Pt denies fever, wt loss, night sweats, loss of appetite, or other constitutional symptoms  No other new complaints.  Has been able to lose several lbs with better diet.        Wt Readings from Last 3 Encounters:  12/14/20 177 lb 3.2 oz (80.4 kg)  10/12/20 179 lb (81.2 kg)  06/14/20 183 lb 6.4 oz (83.2 kg)   BP Readings from Last 3 Encounters:  12/14/20 126/78  10/12/20 118/60  06/14/20 134/70         Past Medical History:  Diagnosis Date   Allergy    Arthritis    Asthma    Bipolar 1 disorder (HCC)    Colon polyps    Depression    Diabetes mellitus without complication (HCC)    GERD (gastroesophageal reflux disease)    History of alcohol abuse    History of chicken pox    History of drug abuse ([place])    Hyperlipidemia    Hypertension    Urine incontinence    Past Surgical History:  Procedure Laterality Date   APPENDECTOMY     BREAST BIOPSY     CHOLECYSTECTOMY     LUNG BIOPSY     SUBTOTAL COLECTOMY      reports that she quit smoking about 4 years ago. Her smoking use included cigarettes. She has a 55.00 pack-year smoking history. She has never used smokeless tobacco. She reports current alcohol use of about 4.0 - 6.0 standard drinks of alcohol per week. She reports current drug use. Frequency: 7.00 times per week. Drug: Marijuana. family history  includes Arthritis in her father; COPD in her mother; Diabetes in her father; Heart disease in her father, paternal grandmother, and paternal uncle; Hyperlipidemia in her mother. Allergies  Allergen Reactions   Penicillins    Current Outpatient Medications on File Prior to Visit  Medication Sig Dispense Refill   amLODipine (NORVASC) 5 MG tablet TAKE 1 TABLET BY MOUTH EVERY DAY 90 tablet 1   aspirin EC 81 MG tablet Take 81 mg by mouth daily.     Cholecalciferol (VITAMIN D3) 2000 units TABS Take by mouth.     divalproex (DEPAKOTE ER) 250 MG 24 hr tablet Take 750 mg by mouth every morning.  4   ezetimibe (ZETIA) 10 MG tablet TAKE 1 TABLET BY MOUTH EVERY DAY 90 tablet 1   famotidine (PEPCID) 20 MG tablet TAKE 1 TABLET BY MOUTH TWICE A DAY **NEED OFFICE VISIT** 180 tablet 0   FLUoxetine (PROZAC) 10 MG capsule Take 10 mg by mouth every morning.  2   FREESTYLE LITE test strip TEST ONCE A DAY. (DX: E11.9) 100 strip 3   multivitamin-iron-minerals-folic acid (CENTRUM) chewable tablet Chew 1 tablet by mouth daily.     Omega-3 Fatty Acids (FISH OIL) 1000 MG CAPS  Take 2 capsules by mouth daily.     pravastatin (PRAVACHOL) 40 MG tablet TAKE 1 TABLET BY MOUTH EVERY DAY 90 tablet 1   QUEtiapine (SEROQUEL) 50 MG tablet Take 1 tablet by mouth 2 (two) times daily.   3   SODIUM FLUORIDE 5000 PPM 1.1 % PSTE USE PEA SIZED AMOUNT & BRUSH DAILY X 2 MINS, SPIT. DO NOT RINSE OR EAT FOR 30 MINS     tolterodine (DETROL LA) 4 MG 24 hr capsule TAKE 1 CAPSULE BY MOUTH EVERY DAY 90 capsule 3   triamcinolone (KENALOG) 0.1 % APPLY TO AFFECTED AREA TWICE A DAY 30 g 1   zolpidem (AMBIEN) 10 MG tablet Take 5 mg by mouth at bedtime as needed for sleep.      gabapentin (NEURONTIN) 100 MG capsule Take 1 capsule (100 mg total) by mouth 3 (three) times daily. 90 capsule 3   tiZANidine (ZANAFLEX) 2 MG tablet Take 1 tablet (2 mg total) by mouth every 6 (six) hours as needed for muscle spasms. 30 tablet 2   traMADol (ULTRAM) 50 MG  tablet TAKE 1 TABLET BY MOUTH EVERY 6 HOURS AS NEEDED. 30 tablet 0   No current facility-administered medications on file prior to visit.        ROS:  All others reviewed and negative.  Objective        PE:  BP 126/78 (BP Location: Left Arm, Patient Position: Sitting, Cuff Size: Normal)   Pulse 85   Temp 98.2 F (36.8 C) (Oral)   Ht 5\' 4"  (1.626 m)   Wt 177 lb 3.2 oz (80.4 kg)   SpO2 95%   BMI 30.42 kg/m                 Constitutional: Pt appears in NAD               HENT: Head: NCAT.                Right Ear: External ear normal.                 Left Ear: External ear normal.                Eyes: . Pupils are equal, round, and reactive to light. Conjunctivae and EOM are normal               Nose: without d/c or deformity               Neck: Neck supple. Gross normal ROM               Cardiovascular: Normal rate and regular rhythm.                 Pulmonary/Chest: Effort normal and breath sounds without rales or wheezing.                Abd:  Soft, NT, ND, + BS, no organomegaly               Neurological: Pt is alert. At baseline orientation, motor grossly intact               Skin: Skin is warm. No rashes, no other new lesions, LE edema - none               Psychiatric: Pt behavior is normal without agitation   Micro: none  Cardiac tracings I have personally interpreted today:  none  Pertinent Radiological findings (summarize): none   Lab  Results  Component Value Date   WBC 7.6 12/14/2020   HGB 12.8 12/14/2020   HCT 37.4 12/14/2020   PLT 203.0 12/14/2020   GLUCOSE 83 12/14/2020   CHOL 153 12/14/2020   TRIG 147.0 12/14/2020   HDL 56.00 12/14/2020   LDLDIRECT 83.0 12/02/2018   LDLCALC 67 12/14/2020   ALT 16 12/14/2020   AST 20 12/14/2020   NA 139 12/14/2020   K 4.3 12/14/2020   CL 104 12/14/2020   CREATININE 1.38 (H) 12/14/2020   BUN 29 (H) 12/14/2020   CO2 28 12/14/2020   TSH 0.45 12/14/2020   HGBA1C 6.1 12/14/2020   MICROALBUR 0.9 12/14/2020    Assessment/Plan:  Paula [name] is a 75 y.o. White or Caucasian [1] female with  has a past medical history of Allergy, Arthritis, Asthma, Bipolar 1 disorder ([place]), Colon polyps, Depression, Diabetes mellitus without complication ([place]), GERD (gastroesophageal reflux disease), History of alcohol abuse, History of chicken pox, History of drug abuse ([place]), Hyperlipidemia, Hypertension, and Urine incontinence.  Bipolar 2 disorder (HCC) Also for depakote level per pt request,  to f/u any worsening symptoms or concerns  COPD  GOLD 0  Stable overall, cont current med tx - albuterol inhaler prn   CKD (chronic kidney disease) stage 3, GFR 30-59 ml/min (HCC) Lab Results  Component Value Date   CREATININE 1.38 (H) 12/14/2020   Stable overall, cont to avoid nephrotoxins  Essential hypertension BP Readings from Last 3 Encounters:  12/14/20 126/78  10/12/20 118/60  06/14/20 134/70   Stable, pt to continue medical treatment norvasc   Mild mitral regurgitation Stable symptoms, exam stable, cont to monitor  Mixed hyperlipidemia Lab Results  Component Value Date   LDLCALC 67 12/14/2020   Stable, pt to continue current statin pravachol   Pre-diabetes Lab Results  Component Value Date   HGBA1C 6.1 12/14/2020   Stable, pt to continue current medical treatment  - diet  Followup: Return in about 6 months (around 06/16/2021).  Cathlean Cower, MD 12/16/2020 9:28 PM [facility]

## 2020-12-14 NOTE — Patient Instructions (Signed)

## 2020-12-15 ENCOUNTER — Encounter: Payer: Self-pay | Admitting: Internal Medicine

## 2020-12-15 LAB — PTH, INTACT AND CALCIUM
Calcium: 9.6 mg/dL (ref 8.6–10.4)
PTH: 67 pg/mL (ref 16–77)

## 2020-12-15 LAB — URINALYSIS, ROUTINE W REFLEX MICROSCOPIC
Bilirubin Urine: NEGATIVE
Hgb urine dipstick: NEGATIVE
Ketones, ur: NEGATIVE
Nitrite: NEGATIVE
RBC / HPF: NONE SEEN (ref 0–?)
Specific Gravity, Urine: 1.015 (ref 1.000–1.030)
Total Protein, Urine: NEGATIVE
Urine Glucose: NEGATIVE
Urobilinogen, UA: 0.2 (ref 0.0–1.0)
pH: 6.5 (ref 5.0–8.0)

## 2020-12-15 LAB — VALPROIC ACID LEVEL: Valproic Acid Lvl: 57.8 mg/L (ref 50.0–100.0)

## 2020-12-16 ENCOUNTER — Encounter: Payer: Self-pay | Admitting: Internal Medicine

## 2020-12-16 NOTE — Assessment & Plan Note (Signed)
Stable symptoms, exam stable, cont to monitor

## 2020-12-16 NOTE — Assessment & Plan Note (Signed)
Stable overall, cont current med tx - albuterol inhaler prn

## 2020-12-16 NOTE — Assessment & Plan Note (Signed)
BP Readings from Last 3 Encounters:  12/14/20 126/78  10/12/20 118/60  06/14/20 134/70   Stable, pt to continue medical treatment norvasc

## 2020-12-16 NOTE — Assessment & Plan Note (Signed)
Lab Results  Component Value Date   CREATININE 1.38 (H) 12/14/2020   Stable overall, cont to avoid nephrotoxins

## 2020-12-16 NOTE — Assessment & Plan Note (Signed)
Lab Results  Component Value Date   HGBA1C 6.1 12/14/2020   Stable, pt to continue current medical treatment  - diet

## 2020-12-16 NOTE — Assessment & Plan Note (Signed)
Lab Results  Component Value Date   LDLCALC 67 12/14/2020   Stable, pt to continue current statin pravachol

## 2020-12-16 NOTE — Assessment & Plan Note (Signed)
Also for depakote level per pt request,  to f/u any worsening symptoms or concerns

## 2020-12-22 ENCOUNTER — Telehealth: Payer: Self-pay | Admitting: Internal Medicine

## 2020-12-22 NOTE — Telephone Encounter (Signed)
Patient states she gave [name] a form for completion during last office visit/ The form is for [employer]  Have you seen this form?

## 2020-12-23 NOTE — Telephone Encounter (Signed)
Ok done hardcopy to [employer]

## 2020-12-29 ENCOUNTER — Telehealth: Payer: Self-pay

## 2020-12-29 NOTE — Telephone Encounter (Signed)
Document was faxed to [employer] on 12/28/20. Will re fax form and call to see if it was received.

## 2020-12-29 NOTE — Telephone Encounter (Signed)
The finished document was given to CMA on Monday this week.  Not sure what happened after that

## 2020-12-29 NOTE — Telephone Encounter (Signed)
Called [employer] [facility] to confirm they received pt form. Form was received. Called pt and left a VM stating that the form was received by [employer].

## 2021-01-10 DIAGNOSIS — F3131 Bipolar disorder, current episode depressed, mild: Secondary | ICD-10-CM | POA: Diagnosis not present

## 2021-01-12 DIAGNOSIS — H5213 Myopia, bilateral: Secondary | ICD-10-CM | POA: Diagnosis not present

## 2021-01-12 DIAGNOSIS — H18593 Other hereditary corneal dystrophies, bilateral: Secondary | ICD-10-CM | POA: Diagnosis not present

## 2021-01-12 DIAGNOSIS — E119 Type 2 diabetes mellitus without complications: Secondary | ICD-10-CM | POA: Diagnosis not present

## 2021-01-12 DIAGNOSIS — H43813 Vitreous degeneration, bilateral: Secondary | ICD-10-CM | POA: Diagnosis not present

## 2021-01-16 ENCOUNTER — Other Ambulatory Visit: Payer: Self-pay | Admitting: Internal Medicine

## 2021-01-16 NOTE — Telephone Encounter (Signed)
Please refill as per office routine med refill policy (all routine meds refilled for 3 mo or monthly per pt preference up to one year from last visit, then month to month grace period for 3 mo, then further med refills will have to be denied)  

## 2021-01-21 ENCOUNTER — Other Ambulatory Visit: Payer: Self-pay | Admitting: Internal Medicine

## 2021-02-09 ENCOUNTER — Other Ambulatory Visit: Payer: Self-pay | Admitting: Internal Medicine

## 2021-02-09 NOTE — Telephone Encounter (Signed)
Please refill as per office routine med refill policy (all routine meds to be refilled for 3 mo or monthly (per pt preference) up to one year from last visit, then month to month grace period for 3 mo, then further med refills will have to be denied) ? ?

## 2021-03-02 DIAGNOSIS — Z23 Encounter for immunization: Secondary | ICD-10-CM | POA: Diagnosis not present

## 2021-03-10 DIAGNOSIS — F3131 Bipolar disorder, current episode depressed, mild: Secondary | ICD-10-CM | POA: Diagnosis not present

## 2021-03-17 ENCOUNTER — Ambulatory Visit (INDEPENDENT_AMBULATORY_CARE_PROVIDER_SITE_OTHER): Payer: Medicare Other

## 2021-03-17 DIAGNOSIS — Z0000 Encounter for general adult medical examination without abnormal findings: Secondary | ICD-10-CM | POA: Diagnosis not present

## 2021-03-17 NOTE — Progress Notes (Signed)
I connected with Paula [name] today by telephone and verified that I am speaking with the correct person using two identifiers. Location patient: home Location provider: work Persons participating in the virtual visit: patient, provider.   I discussed the limitations, risks, security and privacy concerns of performing an evaluation and management service by telephone and the availability of in person appointments. I also discussed with the patient that there may be a patient responsible charge related to this service. The patient expressed understanding and verbally consented to this telephonic visit.    Interactive audio and video telecommunications were attempted between this provider and patient, however failed, due to patient having technical difficulties OR patient did not have access to video capability.  We continued and completed visit with audio only.  Some vital signs may be absent or patient reported.   Time Spent with patient on telephone encounter: 40 minutes  Subjective:   Paula [name] is a 75 y.o. female who presents for Medicare Annual (Subsequent) preventive examination.  Review of Systems     Cardiac Risk Factors include: advanced age (>4men, >18 women);hypertension;family history of premature cardiovascular disease;dyslipidemia     Objective:    There were no vitals filed for this visit. There is no height or weight on file to calculate BMI.  Advanced Directives 03/17/2021 12/11/2019 02/12/2017  Does Patient Have a Medical Advance Directive? Yes Yes Yes  Type of Paramedic of [place];Living will [place];Living will [place];Living will  Does patient want to make changes to medical advance directive? - No - Patient declined -  Copy of [place] in Chart? No - copy requested No - copy requested No - copy requested    Current Medications (verified) Outpatient Encounter  Medications as of 03/17/2021  Medication Sig   gabapentin (NEURONTIN) 100 MG capsule Take 1 capsule (100 mg total) by mouth 3 (three) times daily.   amLODipine (NORVASC) 5 MG tablet TAKE 1 TABLET BY MOUTH EVERY DAY   aspirin EC 81 MG tablet Take 81 mg by mouth daily.   Cholecalciferol (VITAMIN D3) 2000 units TABS Take by mouth.   divalproex (DEPAKOTE ER) 250 MG 24 hr tablet Take 750 mg by mouth every morning.   ezetimibe (ZETIA) 10 MG tablet TAKE 1 TABLET BY MOUTH EVERY DAY   famotidine (PEPCID) 20 MG tablet TAKE 1 TABLET BY MOUTH TWICE A DAY **NEED OFFICE VISIT**   FLUoxetine (PROZAC) 10 MG capsule Take 10 mg by mouth every morning.   FREESTYLE LITE test strip TEST ONCE A DAY. (DX: E11.9)   multivitamin-iron-minerals-folic acid (CENTRUM) chewable tablet Chew 1 tablet by mouth daily.   Omega-3 Fatty Acids (FISH OIL) 1000 MG CAPS Take 2 capsules by mouth daily.   pravastatin (PRAVACHOL) 40 MG tablet TAKE 1 TABLET BY MOUTH EVERY DAY   QUEtiapine (SEROQUEL) 50 MG tablet Take 1 tablet by mouth 2 (two) times daily.    SODIUM FLUORIDE 5000 PPM 1.1 % PSTE USE PEA SIZED AMOUNT & BRUSH DAILY X 2 MINS, SPIT. DO NOT RINSE OR EAT FOR 30 MINS   tolterodine (DETROL LA) 4 MG 24 hr capsule TAKE 1 CAPSULE BY MOUTH EVERY DAY   triamcinolone (KENALOG) 0.1 % APPLY TO AFFECTED AREA TWICE A DAY   zolpidem (AMBIEN) 10 MG tablet Take 5 mg by mouth at bedtime as needed for sleep.    [DISCONTINUED] tiZANidine (ZANAFLEX) 2 MG tablet Take 1 tablet (2 mg total) by mouth every 6 (  six) hours as needed for muscle spasms.   [DISCONTINUED] traMADol (ULTRAM) 50 MG tablet TAKE 1 TABLET BY MOUTH EVERY 6 HOURS AS NEEDED.   No facility-administered encounter medications on file as of 03/17/2021.    Allergies (verified) Penicillins   History: Past Medical History:  Diagnosis Date   Allergy    Arthritis    Asthma    Bipolar 1 disorder ([place])    Colon polyps    Depression    Diabetes mellitus without complication (HCC)     GERD (gastroesophageal reflux disease)    History of alcohol abuse    History of chicken pox    History of drug abuse ([place])    Hyperlipidemia    Hypertension    Urine incontinence    Past Surgical History:  Procedure Laterality Date   APPENDECTOMY     BREAST BIOPSY     CHOLECYSTECTOMY     LUNG BIOPSY     SUBTOTAL COLECTOMY     Family History  Problem Relation Age of Onset   Hyperlipidemia Mother    COPD Mother        smoked   Arthritis Father    Heart disease Father    Diabetes Father    Heart disease Paternal Uncle    Heart disease Paternal Grandmother    Social History   Socioeconomic History   Marital status: Single    Spouse name: Not on file   Number of children: 1   Years of education: 14   Highest education level: Not on file  Occupational History   Occupation: Retired Therapist, sports  Tobacco Use   Smoking status: Former    Packs/day: 1.00    Years: 55.00    Pack years: 55.00    Types: Cigarettes    Quit date: 08/03/2016    Years since quitting: 4.6   Smokeless tobacco: Never  Vaping Use   Vaping Use: Never used  Substance and Sexual Activity   Alcohol use: Yes    Alcohol/week: 4.0 - 6.0 standard drinks    Types: 4 - 6 Cans of beer per week   Drug use: Yes    Frequency: 7.0 times per week    Types: Marijuana   Sexual activity: Not on file  Other Topics Concern   Not on file  Social History Narrative   Fun/Hobby: Read   Denies abuse and feels safe at home    Social Determinants of Health   Financial Resource Strain: Low Risk    Difficulty of Paying Living Expenses: Not hard at all  Food Insecurity: No Food Insecurity   Worried About Charity fundraiser in the Last Year: Never true   Ran Out of Food in the Last Year: Never true  Transportation Needs: No Transportation Needs   Lack of Transportation (Medical): No   Lack of Transportation (Non-Medical): No  Physical Activity: Sufficiently Active   Days of Exercise per Week: 5 days   Minutes of  Exercise per Session: 30 min  Stress: No Stress Concern Present   Feeling of Stress : Not at all  Social Connections: Socially Isolated   Frequency of Communication with Friends and Family: More than three times a week   Frequency of Social Gatherings with Friends and Family: More than three times a week   Attends Religious Services: Never   Marine scientist or Organizations: No   Attends Archivist Meetings: Never   Marital Status: Divorced    Tobacco Counseling Counseling given: Not  Answered   Clinical Intake:  Pre-visit preparation completed: Yes  Pain : No/denies pain     Nutritional Risks: None Diabetes: No  How often do you need to have someone help you when you read instructions, pamphlets, or other written materials from your doctor or pharmacy?: 1 - Never What is the last grade level you completed in school?: HSG; 3 years of college  Diabetic? no  Interpreter Needed?: No  Information entered by :: Lisette Abu, LPN   Activities of Daily Living In your present state of health, do you have any difficulty performing the following activities: 03/17/2021 12/14/2020  Hearing? N N  Vision? N N  Difficulty concentrating or making decisions? N N  Walking or climbing stairs? N N  Dressing or bathing? N N  Doing errands, shopping? N N  Preparing Food and eating ? N -  Using the Toilet? N -  In the past six months, have you accidently leaked urine? N -  Do you have problems with loss of bowel control? N -  Managing your Medications? N -  Managing your Finances? N -  Housekeeping or managing your Housekeeping? N -  Some recent data might be hidden    Patient Care Team: Biagio Borg, MD as PCP - General (Internal Medicine)  Indicate any recent Medical Services you may have received from other than [facility] providers in the past year (date may be approximate).     Assessment:   This is a routine wellness examination for  Paula [name].  Hearing/Vision screen Hearing Screening - Comments:: Patient denied any hearing difficulty.   No hearing aids.  Vision Screening - Comments:: Patient wears corrective glasses/contacts.  Eye exam done annually by: Dr. Marygrace Drought  Dietary issues and exercise activities discussed: Current Exercise Habits: Home exercise routine, Type of exercise: walking, Time (Minutes): 30, Frequency (Times/Week): 5, Weekly Exercise (Minutes/Week): 150, Intensity: Moderate, Exercise limited by: respiratory conditions(s);cardiac condition(s);orthopedic condition(s)   Goals Addressed               This Visit's Progress     Patient Stated (pt-stated)        My goal is to continue to be independent, socially and physically active.      Depression Screen PHQ 2/9 Scores 03/17/2021 12/14/2020 06/14/2020 12/11/2019 12/02/2018 03/13/2018 02/12/2017  PHQ - 2 Score 0 1 1 0 1 0 3  PHQ- 9 Score - - - - - - 5    Fall Risk Fall Risk  03/17/2021 06/14/2020 12/11/2019 12/02/2018 03/13/2018  [name] in the past year? 0 0 0 0 No  Number [name] in past yr: 0 - 0 - -  Injury with Fall? 0 - 0 - -  Risk for fall due to : No Fall Risks - No Fall Risks - -  Follow up [name] evaluation completed - [name] evaluation completed - -    FALL RISK PREVENTION PERTAINING TO THE HOME:  Any stairs in or around the home? Yes  If so, are there any without handrails? No  Home free of loose throw rugs in walkways, pet beds, electrical cords, etc? Yes  Adequate lighting in your home to reduce risk of [name]? Yes   ASSISTIVE DEVICES UTILIZED TO PREVENT [name]:  Life alert? No  Use of a cane, walker or w/c? Yes  Grab bars in the bathroom? Yes  Shower chair or bench in shower? Yes  Elevated toilet seat or a handicapped toilet? Yes   TIMED UP AND GO:  Was the  test performed? No .  Length of time to ambulate 10 feet: n/a sec.   Gait steady and fast with assistive device  Cognitive Function: Normal cognitive status assessed by  direct observation by this Nurse Health Advisor. No abnormalities found.   MMSE - Mini Mental State Exam 02/12/2017  Orientation to time 5  Orientation to Place 5  Registration 3  Attention/ Calculation 5  Recall 2  Language- name 2 objects 2  Language- repeat 1  Language- follow 3 step command 3  Language- read & follow direction 1  Write a sentence 1  Copy design 1  Total score 29     6CIT Screen 12/11/2019  What Year? 0 points  What month? 0 points  What time? 3 points  Count back from 20 0 points  Months in reverse 0 points  Repeat phrase 0 points  Total Score 3    Immunizations Immunization History  Administered Date(s) Administered   Influenza, High Dose Seasonal PF 01/30/2017, 02/06/2017, 03/13/2018, 02/18/2019   Influenza-Unspecified 02/03/2017, 03/04/2020   PFIZER(Purple Top)SARS-COV-2 Vaccination 08/11/2019, 09/10/2019, 03/20/2020, 09/21/2020   Pneumococcal Conjugate-13 02/12/2017   Pneumococcal Polysaccharide-23 12/02/2018   Tdap 05/25/2017    TDAP status: Up to date  Flu Vaccine status: Up to date  Pneumococcal vaccine status: Up to date  Covid-19 vaccine status: Completed vaccines  Qualifies for Shingles Vaccine? Yes   Zostavax completed No   Shingrix Completed?: No.    Education has been provided regarding the importance of this vaccine. Patient has been advised to call insurance company to determine out of pocket expense if they have not yet received this vaccine. Advised may also receive vaccine at local pharmacy or Health Dept. Verbalized acceptance and understanding.  Screening Tests Health Maintenance  Topic Date Due   Zoster Vaccines- Shingrix (1 of 2) Never done   COLONOSCOPY (Pts 45-80yrs Insurance coverage will need to be confirmed)  06/14/2021 (Originally 05/04/1991)   URINE MICROALBUMIN  12/14/2021   TETANUS/TDAP  05/26/2027   INFLUENZA VACCINE  Completed   DEXA SCAN  Completed   COVID-19 Vaccine  Completed   Hepatitis C Screening   Completed   HPV VACCINES  Aged Out    Health Maintenance  Health Maintenance Due  Topic Date Due   Zoster Vaccines- Shingrix (1 of 2) Never done    Colorectal cancer screening: No longer required.   Mammogram status: Completed 05/27/2020. Repeat every year  Bone Density status: Completed 03/30/2017. Results reflect: Bone density results: OSTEOPENIA. Repeat every 2-3 years.  Lung Cancer Screening: (Low Dose CT Chest recommended if Age 64-80 years, 30 pack-year currently smoking OR have quit w/in 15years.) does not qualify.   Lung Cancer Screening Referral: no  Additional Screening:  Hepatitis C Screening: does qualify; Completed yes  Vision Screening: Recommended annual ophthalmology exams for early detection of glaucoma and other disorders of the eye. Is the patient up to date with their annual eye exam?  Yes  Who is the provider or what is the name of the office in which the patient attends annual eye exams? Dr. Marygrace Drought If pt is not established with a provider, would they like to be referred to a provider to establish care? No .   Dental Screening: Recommended annual dental exams for proper oral hygiene  Community Resource Referral / Chronic Care Management: CRR required this visit?  No   CCM required this visit?  No      Plan:     I have personally reviewed and  noted the following in the patient's chart:   Medical and social history Use of alcohol, tobacco or illicit drugs  Current medications and supplements including opioid prescriptions.  Functional ability and status Nutritional status Physical activity Advanced directives List of other physicians Hospitalizations, surgeries, and ER visits in previous 12 months Vitals Screenings to include cognitive, depression, and [name] Referrals and appointments  In addition, I have reviewed and discussed with patient certain preventive protocols, quality metrics, and best practice recommendations. A written  personalized care plan for preventive services as well as general preventive health recommendations were provided to patient.     Sheral Flow, LPN   [id number]   Nurse Notes:  Patient is cogitatively intact. There were no vitals filed for this visit. There is no height or weight on file to calculate BMI. Hearing Screening - Comments:: Patient denied any hearing difficulty.   No hearing aids.  Vision Screening - Comments:: Patient wears corrective glasses/contacts.  Eye exam done annually by: Dr. Marygrace Drought

## 2021-03-17 NOTE — Patient Instructions (Signed)
Paula [name] , Thank you for taking time to come for your Medicare Wellness Visit. I appreciate your ongoing commitment to your health goals. Please review the following plan we discussed and let me know if I can assist you in the future.   Screening recommendations/referrals: Colonoscopy: not a candidate for screening due to age 75: 05/27/2020; due every 1-2 years Bone Density: 03/30/2017; due every 2-3 years Recommended yearly ophthalmology/optometry visit for glaucoma screening and checkup Recommended yearly dental visit for hygiene and checkup  Vaccinations: Influenza vaccine: 03/02/2021 Pneumococcal vaccine: 02/12/2017, 12/02/2018 Tdap vaccine: 05/25/2017; due every 10 years Shingles vaccine: never done   Covid-19: 08/11/2019, 09/10/2019, 03/20/2020, 09/21/2020  Advanced directives: Please bring a copy of your health care power of attorney and living will to the office at your convenience.  Conditions/risks identified: Yes; My goal is to continue to be independent, socially and physically active.  Next appointment: Please schedule your next Medicare Wellness Visit with your Nurse Health Advisor in 1 year by calling [phone].   Preventive Care 47 Years and Older, Female Preventive care refers to lifestyle choices and visits with your health care provider that can promote health and wellness. What does preventive care include? A yearly physical exam. This is also called an annual well check. Dental exams once or twice a year. Routine eye exams. Ask your health care provider how often you should have your eyes checked. Personal lifestyle choices, including: Daily care of your teeth and gums. Regular physical activity. Eating a healthy diet. Avoiding tobacco and drug use. Limiting alcohol use. Practicing safe sex. Taking low-dose aspirin every day. Taking vitamin and mineral supplements as recommended by your health care provider. What happens during an annual well check? The  services and screenings done by your health care provider during your annual well check will depend on your age, overall health, lifestyle risk factors, and family history of disease. Counseling  Your health care provider may ask you questions about your: Alcohol use. Tobacco use. Drug use. Emotional well-being. Home and relationship well-being. Sexual activity. Eating habits. History of falls. Memory and ability to understand (cognition). Work and work Statistician. Reproductive health. Screening  You may have the following tests or measurements: Height, weight, and BMI. Blood pressure. Lipid and cholesterol levels. These may be checked every 5 years, or more frequently if you are over 40 years old. Skin check. Lung cancer screening. You may have this screening every year starting at age 77 if you have a 30-pack-year history of smoking and currently smoke or have quit within the past 15 years. Fecal occult blood test (FOBT) of the stool. You may have this test every year starting at age 77. Flexible sigmoidoscopy or colonoscopy. You may have a sigmoidoscopy every 5 years or a colonoscopy every 10 years starting at age 55. Hepatitis C blood test. Hepatitis B blood test. Sexually transmitted disease (STD) testing. Diabetes screening. This is done by checking your blood sugar (glucose) after you have not eaten for a while (fasting). You may have this done every 1-3 years. Bone density scan. This is done to screen for osteoporosis. You may have this done starting at age 52. Mammogram. This may be done every 1-2 years. Talk to your health care provider about how often you should have regular mammograms. Talk with your health care provider about your test results, treatment options, and if necessary, the need for more tests. Vaccines  Your health care provider may recommend certain vaccines, such as: Influenza vaccine. This is recommended  every year. Tetanus, diphtheria, and acellular  pertussis (Tdap, Td) vaccine. You may need a Td booster every 10 years. Zoster vaccine. You may need this after age 20. Pneumococcal 13-valent conjugate (PCV13) vaccine. One dose is recommended after age 59. Pneumococcal polysaccharide (PPSV23) vaccine. One dose is recommended after age 80. Talk to your health care provider about which screenings and vaccines you need and how often you need them. This information is not intended to replace advice given to you by your health care provider. Make sure you discuss any questions you have with your health care provider. Document Released: 06/18/2015 Document Revised: 02/09/2016 Document Reviewed: 03/23/2015 Elsevier Interactive Patient Education  2017 [place] Prevention in the Home Falls can cause injuries. They can happen to people of all ages. There are many things you can do to make your home safe and to help prevent falls. What can I do on the outside of my home? Regularly fix the edges of walkways and driveways and fix any cracks. Remove anything that might make you trip as you walk through a door, such as a raised step or threshold. Trim any bushes or trees on the path to your home. Use bright outdoor lighting. Clear any walking paths of anything that might make someone trip, such as rocks or tools. Regularly check to see if handrails are loose or broken. Make sure that both sides of any steps have handrails. Any raised decks and porches should have guardrails on the edges. Have any leaves, snow, or ice cleared regularly. Use sand or salt on walking paths during winter. Clean up any spills in your garage right away. This includes oil or grease spills. What can I do in the bathroom? Use night lights. Install grab bars by the toilet and in the tub and shower. Do not use towel bars as grab bars. Use non-skid mats or decals in the tub or shower. If you need to sit down in the shower, use a plastic, non-slip stool. Keep the floor  dry. Clean up any water that spills on the floor as soon as it happens. Remove soap buildup in the tub or shower regularly. Attach bath mats securely with double-sided non-slip rug tape. Do not have throw rugs and other things on the floor that can make you trip. What can I do in the bedroom? Use night lights. Make sure that you have a light by your bed that is easy to reach. Do not use any sheets or blankets that are too big for your bed. They should not hang down onto the floor. Have a firm chair that has side arms. You can use this for support while you get dressed. Do not have throw rugs and other things on the floor that can make you trip. What can I do in the kitchen? Clean up any spills right away. Avoid walking on wet floors. Keep items that you use a lot in easy-to-reach places. If you need to reach something above you, use a strong step stool that has a grab bar. Keep electrical cords out of the way. Do not use floor polish or wax that makes floors slippery. If you must use wax, use non-skid floor wax. Do not have throw rugs and other things on the floor that can make you trip. What can I do with my stairs? Do not leave any items on the stairs. Make sure that there are handrails on both sides of the stairs and use them. Fix handrails that are broken  or loose. Make sure that handrails are as long as the stairways. Check any carpeting to make sure that it is firmly attached to the stairs. Fix any carpet that is loose or worn. Avoid having throw rugs at the top or bottom of the stairs. If you do have throw rugs, attach them to the floor with carpet tape. Make sure that you have a light switch at the top of the stairs and the bottom of the stairs. If you do not have them, ask someone to add them for you. What else can I do to help prevent falls? Wear shoes that: Do not have high heels. Have rubber bottoms. Are comfortable and fit you well. Are closed at the toe. Do not wear  sandals. If you use a stepladder: Make sure that it is fully opened. Do not climb a closed stepladder. Make sure that both sides of the stepladder are locked into place. Ask someone to hold it for you, if possible. Clearly mark and make sure that you can see: Any grab bars or handrails. First and last steps. Where the edge of each step is. Use tools that help you move around (mobility aids) if they are needed. These include: Canes. Walkers. Scooters. Crutches. Turn on the lights when you go into a dark area. Replace any light bulbs as soon as they burn out. Set up your furniture so you have a clear path. Avoid moving your furniture around. If any of your floors are uneven, fix them. If there are any pets around you, be aware of where they are. Review your medicines with your doctor. Some medicines can make you feel dizzy. This can increase your chance of falling. Ask your doctor what other things that you can do to help prevent falls. This information is not intended to replace advice given to you by your health care provider. Make sure you discuss any questions you have with your health care provider. Document Released: 03/18/2009 Document Revised: 10/28/2015 Document Reviewed: 06/26/2014 Elsevier Interactive Patient Education  2017 [employer].

## 2021-04-01 ENCOUNTER — Other Ambulatory Visit: Payer: Self-pay | Admitting: Internal Medicine

## 2021-04-01 NOTE — Telephone Encounter (Signed)
Please refill as per office routine med refill policy (all routine meds to be refilled for 3 mo or monthly (per pt preference) up to one year from last visit, then month to month grace period for 3 mo, then further med refills will have to be denied) ? ?

## 2021-04-18 DIAGNOSIS — F3131 Bipolar disorder, current episode depressed, mild: Secondary | ICD-10-CM | POA: Diagnosis not present

## 2021-05-10 ENCOUNTER — Ambulatory Visit (INDEPENDENT_AMBULATORY_CARE_PROVIDER_SITE_OTHER): Payer: Medicare Other | Admitting: Internal Medicine

## 2021-05-10 ENCOUNTER — Encounter: Payer: Self-pay | Admitting: Internal Medicine

## 2021-05-10 ENCOUNTER — Other Ambulatory Visit: Payer: Self-pay

## 2021-05-10 VITALS — BP 128/70 | HR 78 | Temp 97.9°F | Ht 64.0 in | Wt 186.0 lb

## 2021-05-10 DIAGNOSIS — R21 Rash and other nonspecific skin eruption: Secondary | ICD-10-CM | POA: Diagnosis not present

## 2021-05-10 DIAGNOSIS — N1832 Chronic kidney disease, stage 3b: Secondary | ICD-10-CM | POA: Diagnosis not present

## 2021-05-10 DIAGNOSIS — I10 Essential (primary) hypertension: Secondary | ICD-10-CM

## 2021-05-10 DIAGNOSIS — R7303 Prediabetes: Secondary | ICD-10-CM | POA: Diagnosis not present

## 2021-05-10 MED ORDER — TRIAMCINOLONE ACETONIDE 0.1 % EX CREA: TOPICAL_CREAM | CUTANEOUS | 1 refills | Status: DC

## 2021-05-10 NOTE — Progress Notes (Signed)
Patient ID: Paula [name], female   DOB: [birth year], 75 y.o.   MRN: [id number]        Chief Complaint: follow up HTN, HLD and hyperglycemia , rash       HPI:  Paula [name] is a 75 y.o. female here overall doing ok, but has 1 wk onset red itchy rash to the right dorsal mid foot, without trauma, fever, red streaks, swelling or drainage.  Wondering about infection,  no prior hx of eczema it seems or other significant dermatitis.  Pt denies chest pain, increased sob or doe, wheezing, orthopnea, PND, increased LE swelling, palpitations, dizziness or syncope.   Pt denies polydipsia, polyuria, or new focal neuro s/s.   Pt denies fever, wt loss, night sweats, loss of appetite, or other constitutional symptoms        Wt Readings from Last 3 Encounters:  05/10/21 186 lb (84.4 kg)  12/14/20 177 lb 3.2 oz (80.4 kg)  10/12/20 179 lb (81.2 kg)   BP Readings from Last 3 Encounters:  05/10/21 128/70  12/14/20 126/78  10/12/20 118/60         Past Medical History:  Diagnosis Date   Allergy    Arthritis    Asthma    Bipolar 1 disorder (HCC)    Colon polyps    Depression    Diabetes mellitus without complication (HCC)    GERD (gastroesophageal reflux disease)    History of alcohol abuse    History of chicken pox    History of drug abuse ([place])    Hyperlipidemia    Hypertension    Urine incontinence    Past Surgical History:  Procedure Laterality Date   APPENDECTOMY     BREAST BIOPSY     CHOLECYSTECTOMY     LUNG BIOPSY     SUBTOTAL COLECTOMY      reports that she quit smoking about 4 years ago. Her smoking use included cigarettes. She has a 55.00 pack-year smoking history. She has never used smokeless tobacco. She reports current alcohol use of about 4.0 - 6.0 standard drinks per week. She reports current drug use. Frequency: 7.00 times per week. Drug: Marijuana. family history includes Arthritis in her father; COPD in her mother; Diabetes in her father; Heart disease in her father,  paternal grandmother, and paternal uncle; Hyperlipidemia in her mother. Allergies  Allergen Reactions   Penicillins    Current Outpatient Medications on File Prior to Visit  Medication Sig Dispense Refill   amLODipine (NORVASC) 5 MG tablet TAKE 1 TABLET BY MOUTH EVERY DAY 90 tablet 1   aspirin EC 81 MG tablet Take 81 mg by mouth daily.     Cholecalciferol (VITAMIN D3) 2000 units TABS Take by mouth.     divalproex (DEPAKOTE ER) 250 MG 24 hr tablet Take 750 mg by mouth every morning.  4   ezetimibe (ZETIA) 10 MG tablet TAKE 1 TABLET BY MOUTH EVERY DAY 90 tablet 1   famotidine (PEPCID) 20 MG tablet TAKE 1 TABLET TWICE A DAY 180 tablet 0   FLUoxetine (PROZAC) 10 MG capsule Take 10 mg by mouth every morning.  2   FREESTYLE LITE test strip TEST ONCE A DAY. (DX: E11.9) 100 strip 3   multivitamin-iron-minerals-folic acid (CENTRUM) chewable tablet Chew 1 tablet by mouth daily.     Omega-3 Fatty Acids (FISH OIL) 1000 MG CAPS Take 2 capsules by mouth daily.     pravastatin (PRAVACHOL) 40 MG tablet TAKE 1 TABLET BY MOUTH EVERY DAY  90 tablet 1   QUEtiapine (SEROQUEL) 50 MG tablet Take 1 tablet by mouth 2 (two) times daily.   3   tolterodine (DETROL LA) 4 MG 24 hr capsule TAKE 1 CAPSULE BY MOUTH EVERY DAY 90 capsule 3   zolpidem (AMBIEN) 10 MG tablet Take 5 mg by mouth at bedtime as needed for sleep.      INGREZZA 60 MG capsule Take 60 mg by mouth daily as needed.     No current facility-administered medications on file prior to visit.        ROS:  All others reviewed and negative.  Objective        PE:  BP 128/70 (BP Location: Right Arm, Patient Position: Sitting, Cuff Size: Large)   Pulse 78   Temp 97.9 F (36.6 C) (Oral)   Ht 5\' 4"  (1.626 m)   Wt 186 lb (84.4 kg)   SpO2 97%   BMI 31.93 kg/m                 Constitutional: Pt appears in NAD               HENT: Head: NCAT.                Right Ear: External ear normal.                 Left Ear: External ear normal.                Eyes:  . Pupils are equal, round, and reactive to light. Conjunctivae and EOM are normal               Nose: without d/c or deformity               Neck: Neck supple. Gross normal ROM               Cardiovascular: Normal rate and regular rhythm.                 Pulmonary/Chest: Effort normal and breath sounds without rales or wheezing.                Abd:  Soft, NT, ND, + BS, no organomegaly               Neurological: Pt is alert. At baseline orientation, motor grossly intact               Skin: Skin is warm. Has 1.5 cm non raised area of nontender erythem rash to right dorsal mid foot with some scaliness, no oher red streak, swelling or drainage, no other new lesions, LE edema - none               Psychiatric: Pt behavior is normal without agitation   Micro: none  Cardiac tracings I have personally interpreted today:  none  Pertinent Radiological findings (summarize): none   Lab Results  Component Value Date   WBC 7.6 12/14/2020   HGB 12.8 12/14/2020   HCT 37.4 12/14/2020   PLT 203.0 12/14/2020   GLUCOSE 83 12/14/2020   CHOL 153 12/14/2020   TRIG 147.0 12/14/2020   HDL 56.00 12/14/2020   LDLDIRECT 83.0 12/02/2018   LDLCALC 67 12/14/2020   ALT 16 12/14/2020   AST 20 12/14/2020   NA 139 12/14/2020   K 4.3 12/14/2020   CL 104 12/14/2020   CREATININE 1.38 (H) 12/14/2020   BUN 29 (H) 12/14/2020   CO2 28 12/14/2020   TSH  0.45 12/14/2020   HGBA1C 6.1 12/14/2020   MICROALBUR 0.9 12/14/2020   Assessment/Plan:  Paula [name] is a 75 y.o. White or Caucasian [1] female with  has a past medical history of Allergy, Arthritis, Asthma, Bipolar 1 disorder ([place]), Colon polyps, Depression, Diabetes mellitus without complication ([place]), GERD (gastroesophageal reflux disease), History of alcohol abuse, History of chicken pox, History of drug abuse ([place]), Hyperlipidemia, Hypertension, and Urine incontinence.  Rash This appears to be an area of non infectious dermatitis - for triam cr prn,  to f/u  any worsening symptoms or concerns   Pre-diabetes Lab Results  Component Value Date   HGBA1C 6.1 12/14/2020   Stable, pt to continue current medical treatment  - diet   CKD (chronic kidney disease) stage 3, GFR 30-59 ml/min (HCC) Lab Results  Component Value Date   CREATININE 1.38 (H) 12/14/2020   Stable overall, cont to avoid nephrotoxins   Essential hypertension BP Readings from Last 3 Encounters:  05/10/21 128/70  12/14/20 126/78  10/12/20 118/60   Stable, pt to continue medical treatment norvasc  Followup: Return if symptoms worsen or fail to improve.  Cathlean Cower, MD 05/15/2021 3:57 PM [facility]

## 2021-05-10 NOTE — Patient Instructions (Signed)
Please take all new medication as prescribed - the steroid cream  Please continue all other medications as before, and refills have been done if requested.  Please have the pharmacy call with any other refills you may need.  Please continue your efforts at being more active, low cholesterol diet, and weight control..  Please keep your appointments with your specialists as you may have planned

## 2021-05-15 ENCOUNTER — Encounter: Payer: Self-pay | Admitting: Internal Medicine

## 2021-05-15 NOTE — Assessment & Plan Note (Signed)
Lab Results  Component Value Date   HGBA1C 6.1 12/14/2020   Stable, pt to continue current medical treatment  - diet

## 2021-05-15 NOTE — Assessment & Plan Note (Signed)
Lab Results  Component Value Date   CREATININE 1.38 (H) 12/14/2020   Stable overall, cont to avoid nephrotoxins

## 2021-05-15 NOTE — Assessment & Plan Note (Signed)
This appears to be an area of non infectious dermatitis - for triam cr prn,  to f/u any worsening symptoms or concerns

## 2021-05-15 NOTE — Assessment & Plan Note (Signed)
BP Readings from Last 3 Encounters:  05/10/21 128/70  12/14/20 126/78  10/12/20 118/60   Stable, pt to continue medical treatment norvasc

## 2021-06-01 ENCOUNTER — Other Ambulatory Visit: Payer: Self-pay | Admitting: Internal Medicine

## 2021-06-08 ENCOUNTER — Encounter: Payer: Self-pay | Admitting: Internal Medicine

## 2021-06-08 ENCOUNTER — Telehealth (INDEPENDENT_AMBULATORY_CARE_PROVIDER_SITE_OTHER): Payer: Medicare Other | Admitting: Internal Medicine

## 2021-06-08 ENCOUNTER — Telehealth: Payer: Self-pay | Admitting: Internal Medicine

## 2021-06-08 DIAGNOSIS — U071 COVID-19: Secondary | ICD-10-CM | POA: Diagnosis not present

## 2021-06-08 MED ORDER — MOLNUPIRAVIR EUA 200MG CAPSULE: 4.0000 | ORAL_CAPSULE | Freq: Two times a day (BID) | ORAL | 0 refills | Status: AC

## 2021-06-08 NOTE — Telephone Encounter (Signed)
Is she ok for phone visit at [facility] today or shortly after?   If so, ok to put on the schedule and I will call her

## 2021-06-08 NOTE — Assessment & Plan Note (Signed)
See notes

## 2021-06-08 NOTE — Telephone Encounter (Signed)
Patient has been placed on schedule.

## 2021-06-08 NOTE — Telephone Encounter (Signed)
Patient states she tested positive for covid on 06-07-2021  Patient states she has minimum symptoms consisting of periodic sinus pressure  Patient requesting provider's advice on care and if medication is needed   Offered patient vv, patient declined  Please advise

## 2021-06-08 NOTE — Progress Notes (Signed)
Patient ID: Paula [name], female   DOB: April 09, 1946, 76 y.o.   MRN: [id number]  Cumulative time during 7-day interval 12 min, there was not an associated office visit for this concern within a 7 day period.  Verbal consent for services obtained from patient prior to services given.  Names of all persons present for services: Cathlean Cower, MD, patient  Chief complaint: covid positive  History, background, results pertinent: Here with c/o new onset COVID positive found on home testing yesterday, after son was tested + the day before.  Pt c/o URi like congestion, HA, fatigue and slight non prod cough, but no loss of taste or smell, n/v/d, worsening sob or other pain   No fever, but has had severals chills at the start   Pt is covid x 2 with booster x 2.   No prior hx of covid infection.  Pt denies other chest pain, increased sob or doe, wheezing, orthopnea, PND, increased LE swelling, palpitations, dizziness or syncope.  Denies worsening reflux, abd pain, dysphagia, n/v, bowel change or blood. Past Medical History:  Diagnosis Date   Allergy    Arthritis    Asthma    Bipolar 1 disorder ([place])    Colon polyps    Depression    Diabetes mellitus without complication (HCC)    GERD (gastroesophageal reflux disease)    History of alcohol abuse    History of chicken pox    History of drug abuse ([place])    Hyperlipidemia    Hypertension    Urine incontinence    Past Surgical History:  Procedure Laterality Date   APPENDECTOMY     BREAST BIOPSY     CHOLECYSTECTOMY     LUNG BIOPSY     SUBTOTAL COLECTOMY      reports that she quit smoking about 4 years ago. Her smoking use included cigarettes. She has a 55.00 pack-year smoking history. She has never used smokeless tobacco. She reports current alcohol use of about 4.0 - 6.0 standard drinks per week. She reports current drug use. Frequency: 7.00 times per week. Drug: Marijuana. family history includes Arthritis in her father; COPD in her mother;  Diabetes in her father; Heart disease in her father, paternal grandmother, and paternal uncle; Hyperlipidemia in her mother. Allergies  Allergen Reactions   Penicillins    Current Outpatient Medications on File Prior to Visit  Medication Sig Dispense Refill   amLODipine (NORVASC) 5 MG tablet TAKE 1 TABLET BY MOUTH EVERY DAY 90 tablet 1   aspirin EC 81 MG tablet Take 81 mg by mouth daily.     Cholecalciferol (VITAMIN D3) 2000 units TABS Take by mouth.     divalproex (DEPAKOTE ER) 250 MG 24 hr tablet Take 750 mg by mouth every morning.  4   ezetimibe (ZETIA) 10 MG tablet TAKE 1 TABLET BY MOUTH EVERY DAY 90 tablet 1   famotidine (PEPCID) 20 MG tablet TAKE 1 TABLET BY MOUTH TWICE A DAY 180 tablet 3   FLUoxetine (PROZAC) 10 MG capsule Take 10 mg by mouth every morning.  2   FREESTYLE LITE test strip TEST ONCE A DAY. (DX: E11.9) 100 strip 3   INGREZZA 60 MG capsule Take 60 mg by mouth daily as needed.     multivitamin-iron-minerals-folic acid (CENTRUM) chewable tablet Chew 1 tablet by mouth daily.     Omega-3 Fatty Acids (FISH OIL) 1000 MG CAPS Take 2 capsules by mouth daily.     pravastatin (PRAVACHOL) 40 MG tablet TAKE 1  TABLET BY MOUTH EVERY DAY 90 tablet 1   QUEtiapine (SEROQUEL) 50 MG tablet Take 1 tablet by mouth 2 (two) times daily.   3   tolterodine (DETROL LA) 4 MG 24 hr capsule TAKE 1 CAPSULE BY MOUTH EVERY DAY 90 capsule 3   triamcinolone cream (KENALOG) 0.1 % APPLY TO AFFECTED AREA TWICE A DAY 30 g 1   zolpidem (AMBIEN) 10 MG tablet Take 5 mg by mouth at bedtime as needed for sleep.      No current facility-administered medications on file prior to visit.   Lab Results  Component Value Date   WBC 7.6 12/14/2020   HGB 12.8 12/14/2020   HCT 37.4 12/14/2020   PLT 203.0 12/14/2020   GLUCOSE 83 12/14/2020   CHOL 153 12/14/2020   TRIG 147.0 12/14/2020   HDL 56.00 12/14/2020   LDLDIRECT 83.0 12/02/2018   LDLCALC 67 12/14/2020   ALT 16 12/14/2020   AST 20 12/14/2020   NA 139  12/14/2020   K 4.3 12/14/2020   CL 104 12/14/2020   CREATININE 1.38 (H) 12/14/2020   BUN 29 (H) 12/14/2020   CO2 28 12/14/2020   TSH 0.45 12/14/2020   HGBA1C 6.1 12/14/2020   MICROALBUR 0.9 12/14/2020    A/P/next steps:   COVID positive - fortunately minor symptoms only; ok for molnupiravir course, , declines other need for symptomatic medicaitons, continue to montior for any worsening s/s.   Cathlean Cower MD

## 2021-06-08 NOTE — Patient Instructions (Signed)
Please take all new medication as prescribed - the molnupirivir

## 2021-06-09 ENCOUNTER — Other Ambulatory Visit: Payer: Self-pay | Admitting: Internal Medicine

## 2021-06-09 DIAGNOSIS — E782 Mixed hyperlipidemia: Secondary | ICD-10-CM

## 2021-06-09 NOTE — Telephone Encounter (Signed)
Please refill as per office routine med refill policy (all routine meds to be refilled for 3 mo or monthly (per pt preference) up to one year from last visit, then month to month grace period for 3 mo, then further med refills will have to be denied) ? ?

## 2021-06-16 ENCOUNTER — Other Ambulatory Visit: Payer: Self-pay

## 2021-06-16 ENCOUNTER — Encounter: Payer: Self-pay | Admitting: Internal Medicine

## 2021-06-16 ENCOUNTER — Ambulatory Visit (INDEPENDENT_AMBULATORY_CARE_PROVIDER_SITE_OTHER): Payer: Medicare Other | Admitting: Internal Medicine

## 2021-06-16 VITALS — BP 118/64 | HR 77 | Temp 98.1°F | Ht 64.0 in | Wt 180.0 lb

## 2021-06-16 DIAGNOSIS — I10 Essential (primary) hypertension: Secondary | ICD-10-CM | POA: Diagnosis not present

## 2021-06-16 DIAGNOSIS — E782 Mixed hyperlipidemia: Secondary | ICD-10-CM | POA: Diagnosis not present

## 2021-06-16 DIAGNOSIS — N1832 Chronic kidney disease, stage 3b: Secondary | ICD-10-CM

## 2021-06-16 DIAGNOSIS — M25562 Pain in left knee: Secondary | ICD-10-CM

## 2021-06-16 DIAGNOSIS — R7303 Prediabetes: Secondary | ICD-10-CM

## 2021-06-16 DIAGNOSIS — M1712 Unilateral primary osteoarthritis, left knee: Secondary | ICD-10-CM

## 2021-06-16 DIAGNOSIS — U071 COVID-19: Secondary | ICD-10-CM

## 2021-06-16 DIAGNOSIS — E538 Deficiency of other specified B group vitamins: Secondary | ICD-10-CM

## 2021-06-16 DIAGNOSIS — E559 Vitamin D deficiency, unspecified: Secondary | ICD-10-CM | POA: Diagnosis not present

## 2021-06-16 LAB — CBC WITH DIFFERENTIAL/PLATELET
Basophils Absolute: 0 10*3/uL (ref 0.0–0.1)
Basophils Relative: 0.4 % (ref 0.0–3.0)
Eosinophils Absolute: 0.2 10*3/uL (ref 0.0–0.7)
Eosinophils Relative: 3.6 % (ref 0.0–5.0)
HCT: 43.4 % (ref 36.0–46.0)
Hemoglobin: 14.1 g/dL (ref 12.0–15.0)
Lymphocytes Relative: 33.1 % (ref 12.0–46.0)
Lymphs Abs: 2.3 10*3/uL (ref 0.7–4.0)
MCHC: 32.6 g/dL (ref 30.0–36.0)
MCV: 97.7 fl (ref 78.0–100.0)
Monocytes Absolute: 0.8 10*3/uL (ref 0.1–1.0)
Monocytes Relative: 11.1 % (ref 3.0–12.0)
Neutro Abs: 3.5 10*3/uL (ref 1.4–7.7)
Neutrophils Relative %: 51.8 % (ref 43.0–77.0)
Platelets: 204 10*3/uL (ref 150.0–400.0)
RBC: 4.44 Mil/uL (ref 3.87–5.11)
RDW: 12.4 % (ref 11.5–15.5)
WBC: 6.8 10*3/uL (ref 4.0–10.5)

## 2021-06-16 LAB — BASIC METABOLIC PANEL
BUN: 21 mg/dL (ref 6–23)
CO2: 27 mEq/L (ref 19–32)
Calcium: 10.2 mg/dL (ref 8.4–10.5)
Chloride: 105 mEq/L (ref 96–112)
Creatinine, Ser: 1.52 mg/dL — ABNORMAL HIGH (ref 0.40–1.20)
GFR: 33.2 mL/min — ABNORMAL LOW (ref >60.00)
Glucose, Bld: 97 mg/dL (ref 70–99)
Potassium: 4.9 mEq/L (ref 3.5–5.1)
Sodium: 141 mEq/L (ref 135–145)

## 2021-06-16 LAB — URINALYSIS, ROUTINE W REFLEX MICROSCOPIC
Bilirubin Urine: NEGATIVE
Hgb urine dipstick: NEGATIVE
Ketones, ur: NEGATIVE
Nitrite: NEGATIVE
RBC / HPF: NONE SEEN (ref 0–?)
Specific Gravity, Urine: <=1.005 — AB (ref 1.000–1.030)
Total Protein, Urine: NEGATIVE
Urine Glucose: NEGATIVE
Urobilinogen, UA: 0.2 (ref 0.0–1.0)
pH: 6 (ref 5.0–8.0)

## 2021-06-16 LAB — VITAMIN B12: Vitamin B-12: 1027 pg/mL — ABNORMAL HIGH (ref 211–911)

## 2021-06-16 LAB — VITAMIN D 25 HYDROXY (VIT D DEFICIENCY, FRACTURES): VITD: 71.23 ng/mL (ref 30.00–100.00)

## 2021-06-16 LAB — HEPATIC FUNCTION PANEL
ALT: 24 U/L (ref 0–35)
AST: 30 U/L (ref 0–37)
Albumin: 4.3 g/dL (ref 3.5–5.2)
Alkaline Phosphatase: 70 U/L (ref 39–117)
Bilirubin, Direct: 0.1 mg/dL (ref 0.0–0.3)
Total Bilirubin: 0.4 mg/dL (ref 0.2–1.2)
Total Protein: 7.7 g/dL (ref 6.0–8.3)

## 2021-06-16 LAB — LIPID PANEL
Cholesterol: 163 mg/dL (ref 0–200)
HDL: 54.1 mg/dL (ref >39.00)
NonHDL: 108.94
Total CHOL/HDL Ratio: 3
Triglycerides: 216 mg/dL — ABNORMAL HIGH (ref 0.0–149.0)
VLDL: 43.2 mg/dL — ABNORMAL HIGH (ref 0.0–40.0)

## 2021-06-16 LAB — HEMOGLOBIN A1C: Hgb A1c MFr Bld: 6.1 % (ref 4.6–6.5)

## 2021-06-16 LAB — TSH: TSH: 0.52 u[IU]/mL (ref 0.35–5.50)

## 2021-06-16 LAB — LDL CHOLESTEROL, DIRECT: Direct LDL: 73 mg/dL

## 2021-06-16 NOTE — Progress Notes (Signed)
Patient ID: Paula [name], female   DOB: [birth year], 76 y.o.   MRN: [id number]         Chief Complaint:: yearly exam       HPI:  Paula [name] is a 76 y.o. female overall doing well.  Did have covid infection recenlty but only minor nonprod cough now, denies sob.  Pt denies chest pain, increased sob or doe, wheezing, orthopnea, PND, increased LE swelling, palpitations, dizziness or syncope.   Pt denies polydipsia, polyuria, or new focal neuro s/s.    Also hx [place], with swelling at the time with pain then resolved, but now with more pain and swelling mild to mod intermittent , worse to stand and walk, better to sit, nothing else makes better or worse.   Pt denies fever, wt loss, night sweats, loss of appetite, or other constitutional symptoms .  Wt Readings from Last 3 Encounters:  06/16/21 180 lb (81.6 kg)  05/10/21 186 lb (84.4 kg)  12/14/20 177 lb 3.2 oz (80.4 kg)   BP Readings from Last 3 Encounters:  06/16/21 118/64  05/10/21 128/70  12/14/20 126/78   Immunization History  Administered Date(s) Administered   Influenza, High Dose Seasonal PF 01/30/2017, 02/06/2017, 03/13/2018, 02/18/2019   Influenza-Unspecified 02/03/2017, 03/04/2020, 03/02/2021   PFIZER(Purple Top)SARS-COV-2 Vaccination 08/11/2019, 09/10/2019, 03/20/2020, 09/21/2020   Pneumococcal Conjugate-13 02/12/2017   Pneumococcal Polysaccharide-23 12/02/2018   Tdap 05/25/2017   There are no preventive care reminders to display for this patient.     Past Medical History:  Diagnosis Date   Allergy    Arthritis    Asthma    Bipolar 1 disorder ([place])    Colon polyps    Depression    Diabetes mellitus without complication (HCC)    GERD (gastroesophageal reflux disease)    History of alcohol abuse    History of chicken pox    History of drug abuse ([place])    Hyperlipidemia    Hypertension    Urine incontinence    Past Surgical History:  Procedure Laterality Date    APPENDECTOMY     BREAST BIOPSY     CHOLECYSTECTOMY     LUNG BIOPSY     SUBTOTAL COLECTOMY      reports that she quit smoking about 4 years ago. Her smoking use included cigarettes. She has a 55.00 pack-year smoking history. She has never used smokeless tobacco. She reports current alcohol use of about 4.0 - 6.0 standard drinks per week. She reports current drug use. Frequency: 7.00 times per week. Drug: Marijuana. family history includes Arthritis in her father; COPD in her mother; Diabetes in her father; Heart disease in her father, paternal grandmother, and paternal uncle; Hyperlipidemia in her mother. Allergies  Allergen Reactions   Penicillins    Current Outpatient Medications on File Prior to Visit  Medication Sig Dispense Refill   amLODipine (NORVASC) 5 MG tablet TAKE 1 TABLET BY MOUTH EVERY DAY 90 tablet 1   aspirin EC 81 MG tablet Take 81 mg by mouth daily.     Cholecalciferol (VITAMIN D3) 2000 units TABS Take by mouth.     divalproex (DEPAKOTE ER) 250 MG 24 hr tablet Take 750 mg by mouth every morning.  4   ezetimibe (ZETIA) 10 MG tablet TAKE 1 TABLET BY MOUTH EVERY DAY 90 tablet 1   famotidine (PEPCID) 20 MG tablet TAKE 1 TABLET BY MOUTH TWICE A DAY 180 tablet 3   FLUoxetine (  PROZAC) 10 MG capsule Take 10 mg by mouth every morning.  2   FREESTYLE LITE test strip TEST ONCE A DAY. (DX: E11.9) 100 strip 3   INGREZZA 60 MG capsule Take 60 mg by mouth daily as needed.     multivitamin-iron-minerals-folic acid (CENTRUM) chewable tablet Chew 1 tablet by mouth daily.     Omega-3 Fatty Acids (FISH OIL) 1000 MG CAPS Take 2 capsules by mouth daily.     pravastatin (PRAVACHOL) 40 MG tablet TAKE 1 TABLET BY MOUTH EVERY DAY 90 tablet 1   QUEtiapine (SEROQUEL) 50 MG tablet Take 1 tablet by mouth 2 (two) times daily.   3   tolterodine (DETROL LA) 4 MG 24 hr capsule TAKE 1 CAPSULE BY MOUTH EVERY DAY 90 capsule 3   triamcinolone cream (KENALOG) 0.1 % APPLY TO AFFECTED AREA TWICE A DAY 30 g 1    zolpidem (AMBIEN) 10 MG tablet Take 5 mg by mouth at bedtime as needed for sleep.      No current facility-administered medications on file prior to visit.        ROS:  All others reviewed and negative.  Objective        PE:  BP 118/64 (BP Location: Left Arm, Patient Position: Sitting, Cuff Size: Normal)    Pulse 77    Temp 98.1 F (36.7 C) (Oral)    Ht 5\' 4"  (1.626 m)    Wt 180 lb (81.6 kg)    SpO2 96%    BMI 30.90 kg/m                 Constitutional: Pt appears in NAD               HENT: Head: NCAT.                Right Ear: External ear normal.                 Left Ear: External ear normal.                Eyes: . Pupils are equal, round, and reactive to light. Conjunctivae and EOM are normal               Nose: without d/c or deformity               Neck: Neck supple. Gross normal ROM               Cardiovascular: Normal rate and regular rhythm.                 Pulmonary/Chest: Effort normal and breath sounds without rales or wheezing.                Abd:  Soft, NT, ND, + BS, no organomegaly               Left kee with 1+ effusion, crepitus               Neurological: Pt is alert. At baseline orientation, motor grossly intact               Skin: Skin is warm. No rashes, no other new lesions, LE edema - none               Psychiatric: Pt behavior is normal without agitation   Micro: none  Cardiac tracings I have personally interpreted today:  none  Pertinent Radiological findings (summarize): none   Lab Results  Component Value Date  WBC 6.8 06/16/2021   HGB 14.1 06/16/2021   HCT 43.4 06/16/2021   PLT 204.0 06/16/2021   GLUCOSE 97 06/16/2021   CHOL 163 06/16/2021   TRIG 216.0 (H) 06/16/2021   HDL 54.10 06/16/2021   LDLDIRECT 73.0 06/16/2021   LDLCALC 67 12/14/2020   ALT 24 06/16/2021   AST 30 06/16/2021   NA 141 06/16/2021   K 4.9 06/16/2021   CL 105 06/16/2021   CREATININE 1.52 (H) 06/16/2021   BUN 21 06/16/2021   CO2 27 06/16/2021   TSH 0.52 06/16/2021    HGBA1C 6.1 06/16/2021   MICROALBUR 0.9 12/14/2020   Assessment/Plan:  Paula [name] is a 76 y.o. White or Caucasian [1] female with  has a past medical history of Allergy, Arthritis, Asthma, Bipolar 1 disorder ([place]), Colon polyps, Depression, Diabetes mellitus without complication ([place]), GERD (gastroesophageal reflux disease), History of alcohol abuse, History of chicken pox, History of drug abuse ([place]), Hyperlipidemia, Hypertension, and Urine incontinence.  CKD (chronic kidney disease) stage 3, GFR 30-59 ml/min (HCC) Lab Results  Component Value Date   CREATININE 1.52 (H) 06/16/2021   Stable overall, cont to avoid nephrotoxins   Essential hypertension BP Readings from Last 3 Encounters:  06/16/21 118/64  05/10/21 128/70  12/14/20 126/78   Stable, pt to continue medical treatment  - diet and wt control,   Mixed hyperlipidemia Lab Results  Component Value Date   LDLCALC 67 12/14/2020   Stable, pt to continue current statin pravachol 40   Pre-diabetes Lab Results  Component Value Date   HGBA1C 6.1 06/16/2021   Stable, pt to continue current medical treatment  - diet  COVID-19 virus infection Resolved, cont otc delsym prn cough,  to f/u any worsening symptoms or concerns  Arthritis of left knee Moderate to severe, for otc volt gel prn, also refer sport medicine  Followup: No follow-ups on file.  Cathlean Cower, MD 06/21/2021 9:40 PM [facility]

## 2021-06-16 NOTE — Patient Instructions (Signed)
Please take all new medication as recommended - the otc Voltaren gel as needed for the left knee  You will be contacted regarding the referral for: Sports Medicine  Please continue all other medications as before, and refills have been done if requested.  Please have the pharmacy call with any other refills you may need.  Please continue your efforts at being more active, low cholesterol diet, and weight control.  You are otherwise up to date with prevention measures today.  Please keep your appointments with your specialists as you may have planned  Please go to the LAB at the blood drawing area for the tests to be done  You will be contacted by phone if any changes need to be made immediately.  Otherwise, you will receive a letter about your results with an explanation, but please check with MyChart first.  Please remember to sign up for MyChart if you have not done so, as this will be important to you in the future with finding out test results, communicating by private email, and scheduling acute appointments online when needed.  Please make an Appointment to return in 6 months, or sooner if needed

## 2021-06-21 DIAGNOSIS — M1712 Unilateral primary osteoarthritis, left knee: Secondary | ICD-10-CM | POA: Insufficient documentation

## 2021-06-21 NOTE — Assessment & Plan Note (Signed)
Resolved, cont otc delsym prn cough,  to f/u any worsening symptoms or concerns

## 2021-06-21 NOTE — Assessment & Plan Note (Signed)
BP Readings from Last 3 Encounters:  06/16/21 118/64  05/10/21 128/70  12/14/20 126/78   Stable, pt to continue medical treatment  - diet and wt control,

## 2021-06-21 NOTE — Assessment & Plan Note (Signed)
Lab Results  Component Value Date   HGBA1C 6.1 06/16/2021   Stable, pt to continue current medical treatment  - diet

## 2021-06-21 NOTE — Assessment & Plan Note (Signed)
Moderate to severe, for otc volt gel prn, also refer sport medicine

## 2021-06-21 NOTE — Assessment & Plan Note (Signed)
Lab Results  Component Value Date   CREATININE 1.52 (H) 06/16/2021   Stable overall, cont to avoid nephrotoxins

## 2021-06-21 NOTE — Assessment & Plan Note (Signed)
Lab Results  Component Value Date   LDLCALC 67 12/14/2020   Stable, pt to continue current statin pravachol 40

## 2021-06-27 ENCOUNTER — Ambulatory Visit: Payer: Self-pay

## 2021-06-27 ENCOUNTER — Other Ambulatory Visit: Payer: Self-pay

## 2021-06-27 ENCOUNTER — Ambulatory Visit (INDEPENDENT_AMBULATORY_CARE_PROVIDER_SITE_OTHER): Payer: Medicare Other

## 2021-06-27 ENCOUNTER — Ambulatory Visit (INDEPENDENT_AMBULATORY_CARE_PROVIDER_SITE_OTHER): Payer: Medicare Other | Admitting: Family Medicine

## 2021-06-27 VITALS — BP 128/76 | HR 81 | Ht 64.0 in | Wt 181.2 lb

## 2021-06-27 DIAGNOSIS — Z9181 History of falling: Secondary | ICD-10-CM

## 2021-06-27 DIAGNOSIS — M25562 Pain in left knee: Secondary | ICD-10-CM

## 2021-06-27 DIAGNOSIS — G8929 Other chronic pain: Secondary | ICD-10-CM | POA: Diagnosis not present

## 2021-06-27 DIAGNOSIS — R2689 Other abnormalities of gait and mobility: Secondary | ICD-10-CM

## 2021-06-27 NOTE — Progress Notes (Signed)
I, [name], LAT, ATC acting as a scribe for [name], MD.  Subjective:    CC: L knee pain  HPI: Pt is a 76 y/o female c/o L knee pain that she originally injured 10 years ago when [place] an airport. The injury resolved and now pt c/o increased pain x 6 months. Pt locates pain to the anterior and posterior aspect of the L knee. Pt is cautious in getting around and not leading w/ the L, due to feeling like it may give out.  She has been on a new medication for her tardive dyskinesia Ingrezza that she thinks may be making her fall.  She has had a few falls recently.  She does not think she is hurt her knees but she does think this is increasing her fall risk.  L knee swelling: no Mechanical symptoms: yes Aggravates: deep knee pain,  Treatments tried: IBU  Pertinent review of Systems: No fevers or chills  Relevant historical information: Bipolar disorder   Objective:    Vitals:   06/27/21 1233  BP: 128/76  Pulse: 81  SpO2: 97%   General: Well Developed, well nourished, and in no acute distress.   MSK: Left knee mild joint effusion normal-appearing otherwise.  Normal motion with crepitation.  Tender palpation medial joint line. Stable ligamentous exam. Intact strength.  Gait: Shuffling gait slightly unsteady on her feet.  Lab and Radiology Results  Procedure: Real-time Ultrasound Guided Injection of left knee superior lateral patellar space Device: Philips Affiniti 50G Images permanently stored and available for review in PACS Ultrasound evaluation prior to injection reveals mild joint effusion.  Narrowed medial joint line with degenerative appearing medial meniscus. Lateral joint line degenerative lateral meniscus with parameniscal cyst. Posterior knee no Baker's cyst. Verbal informed consent obtained.  Discussed risks and benefits of procedure. Warned about infection bleeding damage to structures skin hypopigmentation and fat atrophy among  others. Patient expresses understanding and agreement Time-out conducted.   Noted no overlying erythema, induration, or other signs of local infection.   Skin prepped in a sterile fashion.   Local anesthesia: Topical Ethyl chloride.   With sterile technique and under real time ultrasound guidance: 40 mg of Kenalog and 2 mL of Marcaine injected into knee joint. Fluid seen entering the joint capsule.   Completed without difficulty   Pain immediately resolved suggesting accurate placement of the medication.   Advised to call if fevers/chills, erythema, induration, drainage, or persistent bleeding.   Images permanently stored and available for review in the ultrasound unit.  Impression: Technically successful ultrasound guided injection.   X-ray images left knee obtained today personally and independently interpreted Severe medial compartment DJD.  No acute fractures. Await formal radiology review    Impression and Recommendations:    Assessment and Plan: 76 y.o. female with chronic left knee pain.  Pain thought to be due to medial compartment DJD.  Plan for steroid injection Voltaren gel and Tylenol arthritis and physical therapy.  PT can help quad strengthening which should help knee pain however I think the main focus of physical therapy should be fall prevention and gait.  She has had several falls recently and has a shuffling gait.  She will contact her psychiatrist to discuss changing her medication but physical therapy especially for gait and fall prevention should be helpful to reduce risk of fall.Marland Kitchen  PDMP not reviewed this encounter. Orders Placed This Encounter  Procedures   [place] LIMITED JOINT SPACE STRUCTURES LOW LEFT(NO LINKED CHARGES)  Standing Status:   Future    Number of Occurrences:   1    Standing Expiration Date:   12/25/2021    Order Specific Question:   Reason for Exam (SYMPTOM  OR DIAGNOSIS REQUIRED)    Answer:   left knee pain    Order Specific Question:    Preferred imaging location?    Answer:   [facility]   DG Knee AP/LAT W/[place] Left    Standing Status:   Future    Number of Occurrences:   1    Standing Expiration Date:   06/27/2022    Order Specific Question:   Reason for Exam (SYMPTOM  OR DIAGNOSIS REQUIRED)    Answer:   left knee pain    Order Specific Question:   Preferred imaging location?    Answer:   [facility]   Ambulatory referral to Physical Therapy    Referral Priority:   Routine    Referral Type:   Physical Medicine    Referral Reason:   Specialty Services Required    Requested Specialty:   Physical Therapy    Number of Visits Requested:   1   No orders of the defined types were placed in this encounter.   Discussed warning signs or symptoms. Please see discharge instructions. Patient expresses understanding.   The above documentation has been reviewed and is accurate and complete [name], M.D.

## 2021-06-27 NOTE — Patient Instructions (Addendum)
Thank you for coming in today.   You received an injection today. Seek immediate medical attention if the joint becomes red, extremely painful, or is oozing fluid.   Please get an Xray today before you leave   Please use Voltaren gel (Generic Diclofenac Gel) up to 4x daily for pain as needed.  This is available over-the-counter as both the name brand Voltaren gel and the generic diclofenac gel.   I've referred you to Physical Therapy.  Let us know if you don't hear from them in one week.   Recheck back in 6 weeks

## 2021-06-28 NOTE — Progress Notes (Signed)
Knee x-ray shows some severe arthritis changes

## 2021-07-06 ENCOUNTER — Other Ambulatory Visit: Payer: Self-pay

## 2021-07-06 ENCOUNTER — Encounter: Payer: Self-pay | Admitting: Physical Therapy

## 2021-07-06 ENCOUNTER — Ambulatory Visit: Payer: Medicare Other | Attending: Family Medicine | Admitting: Physical Therapy

## 2021-07-06 DIAGNOSIS — G8929 Other chronic pain: Secondary | ICD-10-CM | POA: Insufficient documentation

## 2021-07-06 DIAGNOSIS — R2681 Unsteadiness on feet: Secondary | ICD-10-CM

## 2021-07-06 DIAGNOSIS — R262 Difficulty in walking, not elsewhere classified: Secondary | ICD-10-CM | POA: Diagnosis not present

## 2021-07-06 DIAGNOSIS — R296 Repeated falls: Secondary | ICD-10-CM

## 2021-07-06 DIAGNOSIS — M6281 Muscle weakness (generalized): Secondary | ICD-10-CM | POA: Diagnosis not present

## 2021-07-06 DIAGNOSIS — M25562 Pain in left knee: Secondary | ICD-10-CM | POA: Insufficient documentation

## 2021-07-06 NOTE — Therapy (Signed)
[address]. [place], [place], [address] Phone: [phone]   Fax:  [phone]  Physical Therapy Evaluation  Patient Details  Name: Paula [name] MRN: [id number] Date of Birth: 1945/07/07 Referring Provider (PT): Georgina Snell   Encounter Date: 07/06/2021   PT End of Session - 07/06/21 1407     Visit Number 1    Number of Visits 17    Date for PT Re-Evaluation 08/31/21    Authorization Type [employer] and [employer]    Authorization Time Period 07/06/21 to 08/31/21    Progress Note Due on Visit 10    PT Start Time 1315    PT Stop Time 1355    PT Time Calculation (min) 40 min    Activity Tolerance Patient tolerated treatment well    Behavior During Therapy [facility] for tasks assessed/performed             Past Medical History:  Diagnosis Date   Allergy    Arthritis    Asthma    Bipolar 1 disorder ([place])    Colon polyps    Depression    Diabetes mellitus without complication ([place])    GERD (gastroesophageal reflux disease)    History of alcohol abuse    History of chicken pox    History of drug abuse ([place])    Hyperlipidemia    Hypertension    Urine incontinence     Past Surgical History:  Procedure Laterality Date   APPENDECTOMY     BREAST BIOPSY     CHOLECYSTECTOMY     LUNG BIOPSY     SUBTOTAL COLECTOMY      There were no vitals filed for this visit.    Subjective Assessment - 07/06/21 1316     Subjective I injured my left knee about 10 years running in an airport trying to catch my flight, it felt like it was going out, never hurt but did swell. These days, my main concern is that the knee will give out, it feels weak. Dr. Georgina Snell and I are wondering if one of my other medicines might be making me shaky. I've had 3 falls recently, not sure why but I lost my balance and fell, no major injuries. No numbness in feet or legs.    Patient Stated Goals build strength in knee, no more falls    Currently in Pain?  No/denies                [facility] PT Assessment - 07/06/21 0001       Assessment   Medical Diagnosis knee pain    Referring Provider (PT) Georgina Snell    Onset Date/Surgical Date --   10 years ago   Next MD Visit Dr.Corey early March    Prior Therapy none      Precautions   Precautions None      Restrictions   Weight Bearing Restrictions No      Balance Screen   Has the patient fallen in the past 6 months Yes    How many times? 4-5    Has the patient had a decrease in activity level because of a fear of falling?  Yes    Is the patient reluctant to leave their home because of a fear of falling?  Yes   depends on the day     [place] residence      Prior Function   Level of Independence Independent;Independent with basic ADLs  Vocation Retired    Leisure "I have a low key routine"- staying in touch with friends, TV, reading      Observation/Other Assessments   Observations did not formally test HS and hip flexors, but they look quite tight with functional mobility      ROM / Strength   AROM / PROM / Strength Strength;AROM      AROM   AROM Assessment Site Knee    Right/Left Knee Right;Left      Strength   Strength Assessment Site Hip;Knee;Ankle    Right/Left Hip Right;Left    Right Hip Flexion 3/5    Right Hip Extension 3/5    Right Hip ABduction 3/5    Left Hip Flexion 3/5    Left Hip Extension 3/5    Left Hip ABduction 4/5    Right/Left Knee Right;Left    Right Knee Flexion 4-/5    Right Knee Extension 4/5    Left Knee Flexion 3/5    Left Knee Extension 3+/5    Right/Left Ankle Right;Left    Right Ankle Dorsiflexion 4/5    Left Ankle Dorsiflexion 4/5      Transfers   Five time sit to stand comments  12.9 seconds      Standardized Balance Assessment   Standardized Balance Assessment Berg Balance Test      Berg Balance Test   Sit to Stand Able to stand without using hands and stabilize independently    Standing  Unsupported Able to stand safely 2 minutes    Sitting with Back Unsupported but Feet Supported on Floor or Stool Able to sit safely and securely 2 minutes    Stand to Sit Sits safely with minimal use of hands    Transfers Able to transfer safely, minor use of hands    Standing Unsupported with Eyes Closed Able to stand 10 seconds with supervision    Standing Unsupported with Feet Together Able to place feet together independently and stand for 1 minute with supervision    From Standing, Reach Forward with Outstretched Arm Can reach forward >12 cm safely (5")    From Standing Position, Pick up Object from Floor Able to pick up shoe safely and easily    From Standing Position, Turn to Look Behind Over each Shoulder Looks behind one side only/other side shows less weight shift    Turn 360 Degrees Able to turn 360 degrees safely one side only in 4 seconds or less    Standing Unsupported, Alternately Place Feet on Step/Stool Able to stand independently and complete 8 steps >20 seconds    Standing Unsupported, One Foot in [employer] balance while stepping or standing    Standing on One Leg Unable to try or needs assist to prevent fall    Total Score 42                        Objective measurements completed on examination: See above findings.       [facility] Adult PT Treatment/Exercise - 07/06/21 0001       Exercises   Exercises Knee/Hip      Knee/Hip Exercises: Seated   Long Arc Quad Both;1 set;10 reps    Long Arc Quad Limitations red TB    Sit to [employer] 1 set;5 reps;without UE support      Knee/Hip Exercises: Supine   Bridges Both;1 set;10 reps    Bridges Limitations yellow TB  PT Education - 07/06/21 1406     Education Details exam findings, POC, HEP;importance of [place] helping her to find some sort of exercise program she can enjoy and sustain long term so that she maintains gains from therapy and does not have to return for a similar problem     Person(s) Educated Patient    Methods Explanation;Handout;Demonstration    Comprehension Verbalized understanding              PT Short Term Goals - 07/06/21 1411       PT SHORT TERM GOAL #1   Title Will be compliant with appropriate progressive HEP to be updated PRN    Time 4    Period Weeks    Status New    Target Date 08/03/21      PT SHORT TERM GOAL #2   Title Gait pattern to improve to include more definitive heel strike/push off with toes as well as improved step/stride lengths    Time 4    Period Weeks    Status New      PT SHORT TERM GOAL #3   Title Will be able to name at least 3 ways to reduce fall risk at home and in community    Baseline (IE, good foot wear, good lighting, minimize clutter, etc)    Time 4    Period Weeks    Status New      PT SHORT TERM GOAL #4   Title Will report no more episodes of L knee buckling/feeling like it will collapse within the past 2 weeks    Time 4    Period Weeks    Status New               PT Long Term Goals - 07/06/21 1413       PT LONG TERM GOAL #1   Title MMT to have improved by at least 1 grade in all tested groups in order to improve strength and reduce fall risk    Time 8    Period Weeks    Status New    Target Date 08/31/21      PT LONG TERM GOAL #2   Title Berg score to improve to at least 50 to show reduced fall risk    Time 8    Period Weeks    Status New      PT LONG TERM GOAL #3   Title Will be able to ascend/descend steps with U rail and step over step pattern with no L knee pain or weakness in order to reduce fall risk/show improved community access    Time 8    Period Weeks    Status New      PT LONG TERM GOAL #4   Title Will be compliant with gym based HEP versus advanced home HEP to maintain functional gains from therapy and prevent recurrence of problem    Time 8    Period Weeks    Status New                    Plan - 07/06/21 1408     Clinical Impression  Statement [name] arrives today doing well; she tells me her main complaints are knee weakness- she does not trust her left knee and is quite concerned about it giving out- as well as her balance. She has had 4-5 falls in the past 6 months, not sure exactly why. Exam reveals limited functional activity tolerance, impaired functional  balance, and significant functional strength deficits today. We did discuss importance of [place] helping her to find some sort of exercise program she can enjoy and sustain long term so that she maintains gains from therapy and does not have to return for a similar problem. Will benefit from skilled PT services to address identified limitations and reduce fall risk moving forward.    Personal Factors and Comorbidities Age;Fitness;Past/Current Experience;Time since onset of injury/illness/exacerbation;Comorbidity 2    Comorbidities hx covid, tardive dyskinesia (only effects jaw but meds have impact on her gait- shuffles more)    Examination-Activity Limitations Locomotion Level;Transfers;Bed Mobility;Squat;Stairs;Stand;Lift;Carry    Examination-Participation Restrictions Church;Cleaning;Community Activity;Driving;Shop;Laundry;Yard Work    [employer]chant [employer] officer Evolving/Moderate complexity    Clinical Decision Making Moderate    Rehab Potential Good    PT Frequency 2x / week    PT Duration 8 weeks    PT Treatment/Interventions ADLs/Self Care Home Management;Cryotherapy;Electrical Stimulation;Iontophoresis 4mg /ml Dexamethasone;Moist Heat;Ultrasound;DME Instruction;Gait training;Stair training;Functional mobility training;Therapeutic activities;Therapeutic exercise;Balance training;Neuromuscular re-education;Patient/family education;Manual techniques;Energy conservation;Taping;Vasopneumatic Device    PT Next Visit Plan how is HEP going? focus on cardiopulm endurance, functional strength, balance    PT Home Exercise Plan [id number]    Consulted and Agree with Plan of Care  Patient             Patient will benefit from skilled therapeutic intervention in order to improve the following deficits and impairments:  Abnormal gait, Difficulty walking, Cardiopulmonary status limiting activity, Obesity, Decreased activity tolerance, Decreased balance, Decreased knowledge of use of DME, Decreased mobility, Decreased strength  Visit Diagnosis: Muscle weakness (generalized)  Unsteadiness on feet  Repeated falls  Difficulty in walking, not elsewhere classified     Problem List Patient Active Problem List   Diagnosis Date Noted   Arthritis of left knee 06/21/2021   COVID-19 virus infection 06/08/2021   Mild mitral regurgitation 12/14/2020   Hypercalcemia 10/12/2020   Posterior neck pain 10/12/2020   Heart murmur 10/12/2020   Rash 12/11/2019   Pre-ulcerative corn or callous 12/06/2018   Pre-diabetes 03/13/2018   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 [place], [place], [address] Phone: [phone]   Fax:  [phone]  Name: AYSIAH JURADO MRN: [id number] Date of Birth: [birth year]

## 2021-07-12 ENCOUNTER — Other Ambulatory Visit: Payer: Self-pay

## 2021-07-12 ENCOUNTER — Encounter: Payer: Self-pay | Admitting: Physical Therapy

## 2021-07-12 ENCOUNTER — Ambulatory Visit: Payer: Medicare Other | Admitting: Physical Therapy

## 2021-07-12 DIAGNOSIS — R2681 Unsteadiness on feet: Secondary | ICD-10-CM

## 2021-07-12 DIAGNOSIS — G8929 Other chronic pain: Secondary | ICD-10-CM | POA: Diagnosis not present

## 2021-07-12 DIAGNOSIS — M6281 Muscle weakness (generalized): Secondary | ICD-10-CM | POA: Diagnosis not present

## 2021-07-12 DIAGNOSIS — R262 Difficulty in walking, not elsewhere classified: Secondary | ICD-10-CM

## 2021-07-12 DIAGNOSIS — M25562 Pain in left knee: Secondary | ICD-10-CM | POA: Diagnosis not present

## 2021-07-12 DIAGNOSIS — R296 Repeated falls: Secondary | ICD-10-CM | POA: Diagnosis not present

## 2021-07-12 NOTE — Therapy (Signed)
[address]. [place], [place], [address] Phone: [phone]   Fax:  [phone]  Physical Therapy Treatment  Patient Details  Name: Paula [name] MRN: [id number] Date of Birth: 1945-12-21 Referring Provider (PT): Georgina Snell   Encounter Date: 07/12/2021   PT End of Session - 07/12/21 1528     Visit Number 2    Number of Visits 17    Date for PT Re-Evaluation 08/31/21    Authorization Type [employer] and [employer]    Authorization Time Period 07/06/21 to 08/31/21    Progress Note Due on Visit 10    PT Start Time 1448    PT Stop Time 1527    PT Time Calculation (min) 39 min    Activity Tolerance Patient tolerated treatment well    Behavior During Therapy [facility] for tasks assessed/performed             Past Medical History:  Diagnosis Date   Allergy    Arthritis    Asthma    Bipolar 1 disorder ([place])    Colon polyps    Depression    Diabetes mellitus without complication ([place])    GERD (gastroesophageal reflux disease)    History of alcohol abuse    History of chicken pox    History of drug abuse ([place])    Hyperlipidemia    Hypertension    Urine incontinence     Past Surgical History:  Procedure Laterality Date   APPENDECTOMY     BREAST BIOPSY     CHOLECYSTECTOMY     LUNG BIOPSY     SUBTOTAL COLECTOMY      There were no vitals filed for this visit.   Subjective Assessment - 07/12/21 1449     Subjective Some of my exercises are easy, some are hard; bridges and sit to stand are both tough. The knee extensions and tandem stance are improving.    Patient Stated Goals build strength in knee, no more falls    Currently in Pain? No/denies                               [facility] Adult PT Treatment/Exercise - 07/12/21 0001       Knee/Hip Exercises: Aerobic   Nustep L3 BLEs only x6 minutes      Knee/Hip Exercises: Seated   Sit to Sand 2 sets;5 reps;without UE support   red TB around knees      Knee/Hip Exercises: Supine   Bridges Both;1 set;15 reps    Bridges with Clamshell Strengthening;Both;1 set;15 reps   red TB around knees   Straight Leg Raises Strengthening;Both;1 set;10 reps    Other Supine Knee/Hip Exercises clamshells 1x15 red TB                 Balance Exercises - 07/12/21 0001       Balance Exercises: Standing   Standing Eyes Closed Narrow base of support (BOS);Foam/compliant surface;3 reps;30 secs    Tandem Stance Eyes open;Foam/compliant surface;3 reps;20 secs    Tandem Gait 4 reps;Forward    Retro Gait 4 reps    Marching Foam/compliant surface;20 reps    Heel Raises Both;10 reps                PT Education - 07/12/21 1528     Education Details possible DOMS and management strategies, exercise form and purpose    Person(s) Educated Patient  Methods Explanation    Comprehension Verbalized understanding              PT Short Term Goals - 07/06/21 1411       PT SHORT TERM GOAL #1   Title Will be compliant with appropriate progressive HEP to be updated PRN    Time 4    Period Weeks    Status New    Target Date 08/03/21      PT SHORT TERM GOAL #2   Title Gait pattern to improve to include more definitive heel strike/push off with toes as well as improved step/stride lengths    Time 4    Period Weeks    Status New      PT SHORT TERM GOAL #3   Title Will be able to name at least 3 ways to reduce fall risk at home and in community    Baseline (IE, good foot wear, good lighting, minimize clutter, etc)    Time 4    Period Weeks    Status New      PT SHORT TERM GOAL #4   Title Will report no more episodes of L knee buckling/feeling like it will collapse within the past 2 weeks    Time 4    Period Weeks    Status New               PT Long Term Goals - 07/06/21 1413       PT LONG TERM GOAL #1   Title MMT to have improved by at least 1 grade in all tested groups in order to improve strength and reduce fall risk     Time 8    Period Weeks    Status New    Target Date 08/31/21      PT LONG TERM GOAL #2   Title Berg score to improve to at least 50 to show reduced fall risk    Time 8    Period Weeks    Status New      PT LONG TERM GOAL #3   Title Will be able to ascend/descend steps with U rail and step over step pattern with no L knee pain or weakness in order to reduce fall risk/show improved community access    Time 8    Period Weeks    Status New      PT LONG TERM GOAL #4   Title Will be compliant with gym based HEP versus advanced home HEP to maintain functional gains from therapy and prevent recurrence of problem    Time 8    Period Weeks    Status New                   Plan - 07/12/21 1528     Clinical Impression Statement Paula [name] arrives today doing well. Some of her HEP has gotten a bit easier but still challenged by some of it. We spent todays session focusing on a combination of strength, balance, and endurance, tolerated well although we did take some rest breaks during session- definitely still fatigues easily. Will continue to progress as able moving forward.    Personal Factors and Comorbidities Age;Fitness;Past/Current Experience;Time since onset of injury/illness/exacerbation;Comorbidity 2    Comorbidities hx covid, tardive dyskinesia (only effects jaw but meds have impact on her gait- shuffles more)    Examination-Activity Limitations Locomotion Level;Transfers;Bed Mobility;Squat;Stairs;Stand;Lift;Carry    Examination-Participation Restrictions Church;Cleaning;Community Activity;Driving;Shop;Laundry;Yard Work    [employer]chant [employer] officer Evolving/Moderate complexity  Clinical Decision Making Moderate    Rehab Potential Good    PT Frequency 2x / week    PT Duration 8 weeks    PT Treatment/Interventions ADLs/Self Care Home Management;Cryotherapy;Electrical Stimulation;Iontophoresis 4mg /ml Dexamethasone;Moist Heat;Ultrasound;DME Instruction;Gait training;Stair  training;Functional mobility training;Therapeutic activities;Therapeutic exercise;Balance training;Neuromuscular re-education;Patient/family education;Manual techniques;Energy conservation;Taping;Vasopneumatic Device    PT Next Visit Plan focus on cardiopulm endurance, functional strength, balance    PT Home Exercise Plan [id number]    Consulted and Agree with Plan of Care Patient             Patient will benefit from skilled therapeutic intervention in order to improve the following deficits and impairments:  Abnormal gait, Difficulty walking, Cardiopulmonary status limiting activity, Obesity, Decreased activity tolerance, Decreased balance, Decreased knowledge of use of DME, Decreased mobility, Decreased strength  Visit Diagnosis: Muscle weakness (generalized)  Repeated falls  Difficulty in walking, not elsewhere classified  Unsteadiness on feet     Problem List Patient Active Problem List   Diagnosis Date Noted   Arthritis of left knee 06/21/2021   COVID-19 virus infection 06/08/2021   Mild mitral regurgitation 12/14/2020   Hypercalcemia 10/12/2020   Posterior neck pain 10/12/2020   Heart murmur 10/12/2020   Rash 12/11/2019   Pre-ulcerative corn or callous 12/06/2018   Pre-diabetes 03/13/2018   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 [place], [place], [address] Phone: [phone]   Fax:  [phone]  Name: Paula [name] MRN: [id number] Date of Birth: June 08, 1945

## 2021-07-15 ENCOUNTER — Ambulatory Visit: Payer: Medicare Other | Admitting: Physical Therapy

## 2021-07-15 ENCOUNTER — Other Ambulatory Visit: Payer: Self-pay

## 2021-07-15 DIAGNOSIS — R262 Difficulty in walking, not elsewhere classified: Secondary | ICD-10-CM | POA: Diagnosis not present

## 2021-07-15 DIAGNOSIS — M6281 Muscle weakness (generalized): Secondary | ICD-10-CM

## 2021-07-15 DIAGNOSIS — R2681 Unsteadiness on feet: Secondary | ICD-10-CM | POA: Diagnosis not present

## 2021-07-15 DIAGNOSIS — M25562 Pain in left knee: Secondary | ICD-10-CM | POA: Diagnosis not present

## 2021-07-15 DIAGNOSIS — G8929 Other chronic pain: Secondary | ICD-10-CM | POA: Diagnosis not present

## 2021-07-15 DIAGNOSIS — R296 Repeated falls: Secondary | ICD-10-CM | POA: Diagnosis not present

## 2021-07-15 NOTE — Therapy (Signed)
[place]. [place], [place], [address] Phone: [phone]   Fax:  [phone]  Physical Therapy Treatment  Patient Details  Name: Paula [name] MRN: [id number] Date of Birth: 1945-09-01 Referring Provider (PT): Marikay Alar Date: 07/15/2021   PT End of Session - 07/15/21 1051     Visit Number 3    Number of Visits 17    Date for PT Re-Evaluation 08/31/21    Authorization Type [employer] and [employer]    Authorization Time Period 07/06/21 to 08/31/21    PT Start Time 1015    PT Stop Time 1055    PT Time Calculation (min) 40 min             Past Medical History:  Diagnosis Date   Allergy    Arthritis    Asthma    Bipolar 1 disorder ([place])    Colon polyps    Depression    Diabetes mellitus without complication ([place])    GERD (gastroesophageal reflux disease)    History of alcohol abuse    History of chicken pox    History of drug abuse ([place])    Hyperlipidemia    Hypertension    Urine incontinence     Past Surgical History:  Procedure Laterality Date   APPENDECTOMY     BREAST BIOPSY     CHOLECYSTECTOMY     LUNG BIOPSY     SUBTOTAL COLECTOMY      There were no vitals filed for this visit.   Subjective Assessment - 07/15/21 1018     Subjective knee hurts last night but good today    Currently in Pain? No/denies                               [facility] Adult PT Treatment/Exercise - 07/15/21 0001       Knee/Hip Exercises: Aerobic   Nustep L4 BLEs only x6 minutes      Knee/Hip Exercises: Machines for Strengthening   Cybex Knee Extension 5# 10x 2 sets    Cybex Knee Flexion 10# 10 x 2 sets      Knee/Hip Exercises: Standing   Hip Flexion Stengthening;Both;20 reps;Knee bent;Knee straight   HHA on airex ALT LE. MIn A   Hip Abduction Stengthening;Both;Knee straight;10 reps   on airex HHA  Mn A   Hip Extension Stengthening;Both;10 reps;Knee straight   HHA on airex MIN A    Walking with Sports Cord 20# 5x fwd, 3x laterally and 3 x backward   very fatiguing an dLOB laterally several times - not picking up feet L>Rt     Knee/Hip Exercises: Seated   Clamshell with TheraBand Red   2 sets 10   Marching Strengthening;Both;2 sets;10 reps   red tband   Sit to Sand 2 sets;5 reps;without UE support   on airex. CGA and cued to come to full standing                      PT Short Term Goals - 07/15/21 1051       PT SHORT TERM GOAL #1   Title Will be compliant with appropriate progressive HEP to be updated PRN    Status Achieved               PT Long Term Goals - 07/06/21 1413       PT LONG TERM GOAL #1  Title MMT to have improved by at least 1 grade in all tested groups in order to improve strength and reduce fall risk    Time 8    Period Weeks    Status New    Target Date 08/31/21      PT LONG TERM GOAL #2   Title Berg score to improve to at least 50 to show reduced fall risk    Time 8    Period Weeks    Status New      PT LONG TERM GOAL #3   Title Will be able to ascend/descend steps with U rail and step over step pattern with no L knee pain or weakness in order to reduce fall risk/show improved community access    Time 8    Period Weeks    Status New      PT LONG TERM GOAL #4   Title Will be compliant with gym based HEP versus advanced home HEP to maintain functional gains from therapy and prevent recurrence of problem    Time 8    Period Weeks    Status New                   Plan - 07/15/21 1052     Clinical Impression Statement progressed ther ex. added resisted gait and ex on dyna mat but with instabiity and requiring assistance. cued with ex for speed and control. left leg noteably weaker than rt with func ex    PT Treatment/Interventions ADLs/Self Care Home Management;Cryotherapy;Electrical Stimulation;Iontophoresis 4mg /ml Dexamethasone;Moist Heat;Ultrasound;DME Instruction;Gait training;Stair training;Functional  mobility training;Therapeutic activities;Therapeutic exercise;Balance training;Neuromuscular re-education;Patient/family education;Manual techniques;Energy conservation;Taping;Vasopneumatic Device    PT Next Visit Plan focus on cardiopulm endurance, functional strength, balance             Patient will benefit from skilled therapeutic intervention in order to improve the following deficits and impairments:  Abnormal gait, Difficulty walking, Cardiopulmonary status limiting activity, Obesity, Decreased activity tolerance, Decreased balance, Decreased knowledge of use of DME, Decreased mobility, Decreased strength  Visit Diagnosis: Muscle weakness (generalized)  Repeated falls  Difficulty in walking, not elsewhere classified  Unsteadiness on feet     Problem List Patient Active Problem List   Diagnosis Date Noted   Arthritis of left knee 06/21/2021   COVID-19 virus infection 06/08/2021   Mild mitral regurgitation 12/14/2020   Hypercalcemia 10/12/2020   Posterior neck pain 10/12/2020   Heart murmur 10/12/2020   Rash 12/11/2019   Pre-ulcerative corn or callous 12/06/2018   Pre-diabetes 03/13/2018   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 [place], [place], [address] Phone: [phone]   Fax:  [phone]  Name: Paula [name] MRN: [id number] Date of Birth: [birth year]

## 2021-07-19 ENCOUNTER — Other Ambulatory Visit: Payer: Self-pay

## 2021-07-19 ENCOUNTER — Ambulatory Visit: Payer: Medicare Other | Admitting: Physical Therapy

## 2021-07-19 DIAGNOSIS — R296 Repeated falls: Secondary | ICD-10-CM | POA: Diagnosis not present

## 2021-07-19 DIAGNOSIS — M6281 Muscle weakness (generalized): Secondary | ICD-10-CM

## 2021-07-19 DIAGNOSIS — R262 Difficulty in walking, not elsewhere classified: Secondary | ICD-10-CM

## 2021-07-19 DIAGNOSIS — G8929 Other chronic pain: Secondary | ICD-10-CM | POA: Diagnosis not present

## 2021-07-19 DIAGNOSIS — R2681 Unsteadiness on feet: Secondary | ICD-10-CM | POA: Diagnosis not present

## 2021-07-19 DIAGNOSIS — M25562 Pain in left knee: Secondary | ICD-10-CM | POA: Diagnosis not present

## 2021-07-19 NOTE — Therapy (Signed)
[place]. [place], [place], [address] Phone: [phone]   Fax:  [phone]  Physical Therapy Treatment  Patient Details  Name: Paula [name] MRN: [id number] Date of Birth: Apr 17, 1946 Referring Provider (PT): Georgina Snell   Encounter Date: 07/19/2021   PT End of Session - 07/19/21 1351     Visit Number 4    Number of Visits 17    Date for PT Re-Evaluation 08/31/21    Authorization Type [employer] and [employer]    Authorization Time Period 07/06/21 to 08/31/21    PT Start Time 1315    PT Stop Time 1355    PT Time Calculation (min) 40 min             Past Medical History:  Diagnosis Date   Allergy    Arthritis    Asthma    Bipolar 1 disorder ([place])    Colon polyps    Depression    Diabetes mellitus without complication ([place])    GERD (gastroesophageal reflux disease)    History of alcohol abuse    History of chicken pox    History of drug abuse ([place])    Hyperlipidemia    Hypertension    Urine incontinence     Past Surgical History:  Procedure Laterality Date   APPENDECTOMY     BREAST BIOPSY     CHOLECYSTECTOMY     LUNG BIOPSY     SUBTOTAL COLECTOMY      There were no vitals filed for this visit.   Subjective Assessment - 07/19/21 1319     Subjective knee was creaking some but okay. just sore 1 day after    Currently in Pain? No/denies                               [facility] Adult PT Treatment/Exercise - 07/19/21 0001       Transfers   Five time sit to stand comments  11.5 sec      High Level Balance   High Level Balance Activities Tandem walking;Side stepping   on foam beam     Knee/Hip Exercises: Aerobic   Nustep L4 BLEs only x6 minutes      Knee/Hip Exercises: Machines for Strengthening   Cybex Knee Extension 5# 10x 2 sets    Cybex Knee Flexion 10# 10 x 2 sets      Knee/Hip Exercises: Standing   Lateral Step Up Both;10 reps;Hand Hold: 2;Step Height: 4"    Forward  Step Up Both;10 reps;Hand Hold: 2;Step Height: 4"    Walking with Sports Cord 20# 5x fwd, 3x laterally and 3 x backward      Knee/Hip Exercises: Seated   Clamshell with TheraBand Green   2 sets 10   Marching Strengthening;Both;2 sets;10 reps   green tband   Sit to Sand 10 reps;without UE support   on airex                      PT Short Term Goals - 07/19/21 1338       PT SHORT TERM GOAL #2   Title Gait pattern to improve to include more definitive heel strike/push off with toes as well as improved step/stride lengths    Status Partially Met      PT SHORT TERM GOAL #3   Title Will be able to name at least 3 ways to reduce fall risk  at home and in community    Status Achieved      PT [place] #4   Title Will report no more episodes of L knee buckling/feeling like it will collapse within the past 2 weeks    Status Achieved               PT Long Term Goals - 07/06/21 1413       PT LONG TERM GOAL #1   Title MMT to have improved by at least 1 grade in all tested groups in order to improve strength and reduce fall risk    Time 8    Period Weeks    Status New    Target Date 08/31/21      PT LONG TERM GOAL #2   Title Berg score to improve to at least 50 to show reduced fall risk    Time 8    Period Weeks    Status New      PT LONG TERM GOAL #3   Title Will be able to ascend/descend steps with U rail and step over step pattern with no L knee pain or weakness in order to reduce fall risk/show improved community access    Time 8    Period Weeks    Status New      PT LONG TERM GOAL #4   Title Will be compliant with gym based HEP versus advanced home HEP to maintain functional gains from therapy and prevent recurrence of problem    Time 8    Period Weeks    Status New                   Plan - 07/19/21 1351     Clinical Impression Statement STG s emet except still working on better/corect gait pattern. more control with ex today. rest  needed for fatigue    PT Treatment/Interventions ADLs/Self Care Home Management;Cryotherapy;Electrical Stimulation;Iontophoresis 89m/ml Dexamethasone;Moist Heat;Ultrasound;DME Instruction;Gait training;Stair training;Functional mobility training;Therapeutic activities;Therapeutic exercise;Balance training;Neuromuscular re-education;Patient/family education;Manual techniques;Energy conservation;Taping;Vasopneumatic Device    PT Next Visit Plan focus on cardiopulm endurance, functional strength, balance             Patient will benefit from skilled therapeutic intervention in order to improve the following deficits and impairments:  Abnormal gait, Difficulty walking, Cardiopulmonary status limiting activity, Obesity, Decreased activity tolerance, Decreased balance, Decreased knowledge of use of DME, Decreased mobility, Decreased strength  Visit Diagnosis: Muscle weakness (generalized)  Difficulty in walking, not elsewhere classified  Repeated falls     Problem List Patient Active Problem List   Diagnosis Date Noted   Arthritis of left knee 06/21/2021   COVID-19 virus infection 06/08/2021   Mild mitral regurgitation 12/14/2020   Hypercalcemia 10/12/2020   Posterior neck pain 10/12/2020   Heart murmur 10/12/2020   Rash 12/11/2019   Pre-ulcerative corn or callous 12/06/2018   Pre-diabetes 03/13/2018   DIP (desquamative interstitial pneumonia) ([place] 08/31/2017   CKD (chronic kidney disease) stage 3, GFR 30-59 ml/min ([facility] [place] 08/07/2017   Essential hypertension 11/09/2016   Mixed hyperlipidemia 11/09/2016   Bipolar 2 disorder ([facility] 11/09/2016   Overactive bladder 11/09/2016   COPD  GOLD 0  11/09/2016    Artis Buechele,ANGIE, PTA 07/19/2021, 1:54 PM  [place] [address]Phone: [phone]  Fax:  [phone] Name: BLEZLIE RITCHEYMRN: [id number] of Birth: [birth year]

## 2021-07-20 DIAGNOSIS — F3131 Bipolar disorder, current episode depressed, mild: Secondary | ICD-10-CM | POA: Diagnosis not present

## 2021-07-22 ENCOUNTER — Ambulatory Visit: Payer: Medicare Other | Admitting: Physical Therapy

## 2021-07-22 ENCOUNTER — Other Ambulatory Visit: Payer: Self-pay

## 2021-07-22 DIAGNOSIS — R2681 Unsteadiness on feet: Secondary | ICD-10-CM

## 2021-07-22 DIAGNOSIS — R262 Difficulty in walking, not elsewhere classified: Secondary | ICD-10-CM | POA: Diagnosis not present

## 2021-07-22 DIAGNOSIS — M6281 Muscle weakness (generalized): Secondary | ICD-10-CM | POA: Diagnosis not present

## 2021-07-22 DIAGNOSIS — G8929 Other chronic pain: Secondary | ICD-10-CM | POA: Diagnosis not present

## 2021-07-22 DIAGNOSIS — R296 Repeated falls: Secondary | ICD-10-CM | POA: Diagnosis not present

## 2021-07-22 DIAGNOSIS — M25562 Pain in left knee: Secondary | ICD-10-CM | POA: Diagnosis not present

## 2021-07-22 NOTE — Therapy (Signed)
[place] °[facility] °[address] °[place], [place], [address] °Phone: [phone]   Fax:  [phone] ° °Physical Therapy Treatment ° °Patient Details  °Name: Paula [name] °MRN: [id number] °Date of Birth: 10/28/1945 °Referring Provider (PT): Corey ° ° °Encounter Date: 07/22/2021 ° ° PT End of Session - 07/22/21 1042   ° ° Visit Number 5   ° Number of Visits 17   ° Date for PT Re-Evaluation 08/31/21   ° Authorization Type [employer] and [employer]   ° Authorization Time Period 07/06/21 to 08/31/21   ° PT Start Time 1010   ° PT Stop Time 1055   ° PT Time Calculation (min) 45 min   ° °  °  ° °  ° ° °Past Medical History:  °Diagnosis Date  ° Allergy   ° Arthritis   ° Asthma   ° Bipolar 1 disorder ([facility])   ° Colon polyps   ° Depression   ° Diabetes mellitus without complication ([facility])   ° GERD (gastroesophageal reflux disease)   ° History of alcohol abuse   ° History of chicken pox   ° History of drug abuse ([facility])   ° Hyperlipidemia   ° Hypertension   ° Urine incontinence   ° ° °Past Surgical History:  °Procedure Laterality Date  ° APPENDECTOMY    ° BREAST BIOPSY    ° CHOLECYSTECTOMY    ° LUNG BIOPSY    ° SUBTOTAL COLECTOMY    ° ° °There were no vitals filed for this visit. ° ° Subjective Assessment - 07/22/21 1012   ° ° Subjective denies stumbes/falls or buckling   ° Currently in Pain? Yes   ° Pain Score 2    ° Pain Location Knee   ° Pain Orientation Left   ° °  °  ° °  ° ° ° ° ° ° ° ° ° ° ° ° ° ° ° ° ° ° ° ° [facility] Adult PT Treatment/Exercise - 07/22/21 0001   ° °  ° High Level Balance  ° High Level Balance Activities Marching forwards;Backward walking;Side stepping   HHA 2# 10 feet 2 x each  °  ° Knee/Hip Exercises: Aerobic  ° Nustep L 5 6 min   °  ° Knee/Hip Exercises: Machines for Strengthening  ° Cybex Knee Extension 5# 10x 2 sets   ° Cybex Knee Flexion 10# 10 x 2 sets   °  ° Knee/Hip Exercises: Standing  ° Walking with Sports Cord 20# 5x fwd, 3x laterally and 3 x backward   ° Other  Standing Knee Exercises 2# ankle wt step tap 6 inch 10 x each the alt 20 x. stepping fw and laterally over roll 10 x each - all with HA   °  ° Knee/Hip Exercises: Seated  ° Sit to Sand 10 reps;without UE support   on airex CGA  ° °  °  ° °  ° ° ° ° ° ° ° ° ° ° ° ° PT Short Term Goals - 07/19/21 1338   ° °  ° PT SHORT TERM GOAL #2  ° Title Gait pattern to improve to include more definitive heel strike/push off with toes as well as improved step/stride lengths   ° Status Partially Met   °  ° PT SHORT TERM GOAL #3  ° Title Will be able to name at least 3 ways to reduce fall risk at home and in community   ° Status Achieved   °  °   PT SHORT TERM GOAL #4   Title Will report no more episodes of L knee buckling/feeling like it will collapse within the past 2 weeks    Status Achieved               PT Long Term Goals - 07/06/21 1413       PT LONG TERM GOAL #1   Title MMT to have improved by at least 1 grade in all tested groups in order to improve strength and reduce fall risk    Time 8    Period Weeks    Status New    Target Date 08/31/21      PT LONG TERM GOAL #2   Title Berg score to improve to at least 50 to show reduced fall risk    Time 8    Period Weeks    Status New      PT LONG TERM GOAL #3   Title Will be able to ascend/descend steps with U rail and step over step pattern with no L knee pain or weakness in order to reduce fall risk/show improved community access    Time 8    Period Weeks    Status New      PT LONG TERM GOAL #4   Title Will be compliant with gym based HEP versus advanced home HEP to maintain functional gains from therapy and prevent recurrence of problem    Time 8    Period Weeks    Status New                   Plan - 07/22/21 1041     Clinical Impression Statement progressed func strengthening to include balance with minimal HHA and cuing. increased control of mvmt. seated rest for minimal SOB    PT Treatment/Interventions ADLs/Self Care Home  Management;Cryotherapy;Electrical Stimulation;Iontophoresis 17m/ml Dexamethasone;Moist Heat;Ultrasound;DME Instruction;Gait training;Stair training;Functional mobility training;Therapeutic activities;Therapeutic exercise;Balance training;Neuromuscular re-education;Patient/family education;Manual techniques;Energy conservation;Taping;Vasopneumatic Device    PT Next Visit Plan focus on cardiopulm endurance, functional strength, balance             Patient will benefit from skilled therapeutic intervention in order to improve the following deficits and impairments:  Abnormal gait, Difficulty walking, Cardiopulmonary status limiting activity, Obesity, Decreased activity tolerance, Decreased balance, Decreased knowledge of use of DME, Decreased mobility, Decreased strength  Visit Diagnosis: Muscle weakness (generalized)  Difficulty in walking, not elsewhere classified  Unsteadiness on feet     Problem List Patient Active Problem List   Diagnosis Date Noted   Arthritis of left knee 06/21/2021   COVID-19 virus infection 06/08/2021   Mild mitral regurgitation 12/14/2020   Hypercalcemia 10/12/2020   Posterior neck pain 10/12/2020   Heart murmur 10/12/2020   Rash 12/11/2019   Pre-ulcerative corn or callous 12/06/2018   Pre-diabetes 03/13/2018   DIP (desquamative interstitial pneumonia) ([place] 08/31/2017   CKD (chronic kidney disease) stage 3, GFR 30-59 ml/min ([facility] [place] 08/07/2017   Essential hypertension 11/09/2016   Mixed hyperlipidemia 11/09/2016   Bipolar 2 disorder ([facility] 11/09/2016   Overactive bladder 11/09/2016   COPD  GOLD 0  11/09/2016    Tylah Mancillas,ANGIE, PTA 07/22/2021, 10:43 AM  [place] [id number]: [phone]  Fax:  [phone] Name: BAPPLE DEARMASMRN: [id number] of Birth: 112-31-1947

## 2021-07-26 ENCOUNTER — Ambulatory Visit: Payer: Medicare Other | Admitting: Physical Therapy

## 2021-07-26 ENCOUNTER — Encounter: Payer: Self-pay | Admitting: Physical Therapy

## 2021-07-26 ENCOUNTER — Other Ambulatory Visit: Payer: Self-pay

## 2021-07-26 DIAGNOSIS — R296 Repeated falls: Secondary | ICD-10-CM

## 2021-07-26 DIAGNOSIS — M6281 Muscle weakness (generalized): Secondary | ICD-10-CM | POA: Diagnosis not present

## 2021-07-26 DIAGNOSIS — R2681 Unsteadiness on feet: Secondary | ICD-10-CM

## 2021-07-26 DIAGNOSIS — G8929 Other chronic pain: Secondary | ICD-10-CM | POA: Diagnosis not present

## 2021-07-26 DIAGNOSIS — M25562 Pain in left knee: Secondary | ICD-10-CM | POA: Diagnosis not present

## 2021-07-26 DIAGNOSIS — R262 Difficulty in walking, not elsewhere classified: Secondary | ICD-10-CM | POA: Diagnosis not present

## 2021-07-26 NOTE — Therapy (Signed)
[place]. [place], [place], [address] Phone: [phone]   Fax:  [phone]  Physical Therapy Treatment  Patient Details  Name: Paula [name] MRN: [id number] Date of Birth: 1946/05/06 Referring Provider (PT): Georgina Snell   Encounter Date: 07/26/2021   PT End of Session - 07/26/21 1528     Visit Number 6    Number of Visits 17    Date for PT Re-Evaluation 08/31/21    Authorization Type [employer] and [employer]    Authorization Time Period 07/06/21 to 08/31/21    Progress Note Due on Visit 10    PT Start Time 1448    PT Stop Time 1527    PT Time Calculation (min) 39 min    Activity Tolerance Patient tolerated treatment well    Behavior During Therapy [facility] for tasks assessed/performed             Past Medical History:  Diagnosis Date   Allergy    Arthritis    Asthma    Bipolar 1 disorder ([place])    Colon polyps    Depression    Diabetes mellitus without complication ([place])    GERD (gastroesophageal reflux disease)    History of alcohol abuse    History of chicken pox    History of drug abuse ([place])    Hyperlipidemia    Hypertension    Urine incontinence     Past Surgical History:  Procedure Laterality Date   APPENDECTOMY     BREAST BIOPSY     CHOLECYSTECTOMY     LUNG BIOPSY     SUBTOTAL COLECTOMY      There were no vitals filed for this visit.   Subjective Assessment - 07/26/21 1450     Subjective Doing good, having some cramping at night not during the day; they happen after I go to sleep and they wake me up.    Patient Stated Goals build strength in knee, no more falls    Currently in Pain? No/denies                               [facility] Adult PT Treatment/Exercise - 07/26/21 0001       Knee/Hip Exercises: Standing   Lateral Step Up Both;1 set;10 reps;Hand Hold: 1;Step Height: 4"    Forward Step Up Both;1 set;15 reps;Hand Hold: 1;Step Height: 4"    Step Down Both;1 set;10  reps;Hand Hold: 1;Step Height: 2"    Wall Squat 1 set    Wall Squat Limitations 7 reps, limited by mm fatigue    Other Standing Knee Exercises hip hikes 1x10 without swing, 1x5 with swing B      Knee/Hip Exercises: Seated   Long Arc Quad --    Long Arc Quad Limitations --            Nustep level 5 x6 minutes BLEs only      Balance Exercises - 07/26/21 0001       Balance Exercises: Standing   Tandem Stance Eyes open;Foam/compliant surface;3 reps;20 secs    Standing, One Foot on a Step Eyes open;Head turns;8 inch   20 reps x2   Wall Bumps Eyes opened;10 reps    Retro Gait 4 reps;Foam/compliant surface    Marching Foam/compliant surface;20 reps    Heel Raises Both;10 reps                PT Education -  07/26/21 1528     Education Details exercise from and purpose    Person(s) Educated Patient    Methods Explanation    Comprehension Verbalized understanding              PT Short Term Goals - 07/19/21 1338       PT SHORT TERM GOAL #2   Title Gait pattern to improve to include more definitive heel strike/push off with toes as well as improved step/stride lengths    Status Partially Met      PT SHORT TERM GOAL #3   Title Will be able to name at least 3 ways to reduce fall risk at home and in community    Status Achieved      PT [place] #4   Title Will report no more episodes of L knee buckling/feeling like it will collapse within the past 2 weeks    Status Achieved               PT Long Term Goals - 07/06/21 1413       PT LONG TERM GOAL #1   Title MMT to have improved by at least 1 grade in all tested groups in order to improve strength and reduce fall risk    Time 8    Period Weeks    Status New    Target Date 08/31/21      PT LONG TERM GOAL #2   Title Berg score to improve to at least 50 to show reduced fall risk    Time 8    Period Weeks    Status New      PT LONG TERM GOAL #3   Title Will be able to ascend/descend steps with  U rail and step over step pattern with no L knee pain or weakness in order to reduce fall risk/show improved community access    Time 8    Period Weeks    Status New      PT LONG TERM GOAL #4   Title Will be compliant with gym based HEP versus advanced home HEP to maintain functional gains from therapy and prevent recurrence of problem    Time 8    Period Weeks    Status New                   Plan - 07/26/21 1529     Clinical Impression Statement [name] arrives today doing well, doing OK but reports she has been having some increased muscle cramping at night- advised her to speak with MD about this. Continued work on functional strengthening and balance work today, tolerated well but does continue to be easily fatigued but does feel she is improving with therapy. Did need a lot of cues for good form during exercises today, especially novel tasks. Will continue to progress as tolerated.    Personal Factors and Comorbidities Age;Fitness;Past/Current Experience;Time since onset of injury/illness/exacerbation;Comorbidity 2    Comorbidities hx covid, tardive dyskinesia (only effects jaw but meds have impact on her gait- shuffles more)    Examination-Activity Limitations Locomotion Level;Transfers;Bed Mobility;Squat;Stairs;Stand;Lift;Carry    Examination-Participation Restrictions Church;Cleaning;Community Activity;Driving;Shop;Laundry;Yard Work    [employer] Evolving/Moderate complexity    Clinical Decision Making Moderate    Rehab Potential Good    PT Frequency 2x / week    PT Duration 8 weeks    PT Treatment/Interventions ADLs/Self Care Home Management;Cryotherapy;Electrical Stimulation;Iontophoresis 58m/ml Dexamethasone;Moist Heat;Ultrasound;DME Instruction;Gait training;Stair training;Functional mobility training;Therapeutic activities;Therapeutic exercise;Balance training;Neuromuscular re-education;Patient/family education;Manual  techniques;Energy  conservation;Taping;Vasopneumatic Device    PT Next Visit Plan focus on cardiopulm endurance, functional strength, balance    PT Home Exercise Plan [email or web address]    Consulted and Agree with Plan of Care Patient             Patient will benefit from skilled therapeutic intervention in order to improve the following deficits and impairments:  Abnormal gait, Difficulty walking, Cardiopulmonary status limiting activity, Obesity, Decreased activity tolerance, Decreased balance, Decreased knowledge of use of DME, Decreased mobility, Decreased strength  Visit Diagnosis: Muscle weakness (generalized)  Unsteadiness on feet  Difficulty in walking, not elsewhere classified  Repeated falls     Problem List Patient Active Problem List   Diagnosis Date Noted   Arthritis of left knee 06/21/2021   COVID-19 virus infection 06/08/2021   Mild mitral regurgitation 12/14/2020   Hypercalcemia 10/12/2020   Posterior neck pain 10/12/2020   Heart murmur 10/12/2020   Rash 12/11/2019   Pre-ulcerative corn or callous 12/06/2018   Pre-diabetes 03/13/2018   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 [place], [place], [address] Phone: [phone]   Fax:  [phone]  Name: WILLIS KUIPERS MRN: [id number] Date of Birth: [birth year]

## 2021-07-28 ENCOUNTER — Other Ambulatory Visit: Payer: Self-pay

## 2021-07-28 ENCOUNTER — Ambulatory Visit: Payer: Medicare Other | Admitting: Physical Therapy

## 2021-07-28 DIAGNOSIS — M6281 Muscle weakness (generalized): Secondary | ICD-10-CM | POA: Diagnosis not present

## 2021-07-28 DIAGNOSIS — G8929 Other chronic pain: Secondary | ICD-10-CM | POA: Diagnosis not present

## 2021-07-28 DIAGNOSIS — R2681 Unsteadiness on feet: Secondary | ICD-10-CM

## 2021-07-28 DIAGNOSIS — R296 Repeated falls: Secondary | ICD-10-CM | POA: Diagnosis not present

## 2021-07-28 DIAGNOSIS — M25562 Pain in left knee: Secondary | ICD-10-CM | POA: Diagnosis not present

## 2021-07-28 DIAGNOSIS — R262 Difficulty in walking, not elsewhere classified: Secondary | ICD-10-CM | POA: Diagnosis not present

## 2021-07-28 NOTE — Therapy (Signed)
[facility]. [place], [place], [address] Phone: [phone]   Fax:  [phone]  Physical Therapy Treatment  Patient Details  Name: Paula [name] MRN: [id number] Date of Birth: Sep 30, 1945 Referring Provider (PT): Marikay Alar Date: 07/28/2021   PT End of Session - 07/28/21 1350     Visit Number 7    Number of Visits 17    Date for PT Re-Evaluation 08/31/21    Authorization Type [employer] and [employer]    Authorization Time Period 07/06/21 to 08/31/21    PT Start Time 1305    PT Stop Time 1350    PT Time Calculation (min) 45 min             Past Medical History:  Diagnosis Date   Allergy    Arthritis    Asthma    Bipolar 1 disorder ([place])    Colon polyps    Depression    Diabetes mellitus without complication ([place])    GERD (gastroesophageal reflux disease)    History of alcohol abuse    History of chicken pox    History of drug abuse ([place])    Hyperlipidemia    Hypertension    Urine incontinence     Past Surgical History:  Procedure Laterality Date   APPENDECTOMY     BREAST BIOPSY     CHOLECYSTECTOMY     LUNG BIOPSY     SUBTOTAL COLECTOMY      There were no vitals filed for this visit.   Subjective Assessment - 07/28/21 1311     Subjective doing pretty well, most pain at night with leg out straight    Currently in Pain? Yes    Pain Score 2     Pain Location Knee    Pain Orientation Right                               [facility] Adult PT Treatment/Exercise - 07/28/21 0001       Standardized Balance Assessment   Standardized Balance Assessment Berg Balance Test      Berg Balance Test   Sit to Stand Able to stand without using hands and stabilize independently    Standing Unsupported Able to stand safely 2 minutes    Sitting with Back Unsupported but Feet Supported on Floor or Stool Able to sit safely and securely 2 minutes    Stand to Sit Sits safely with minimal  use of hands    Transfers Able to transfer safely, minor use of hands    Standing Unsupported with Eyes Closed Able to stand 10 seconds safely    Standing Ubsupported with Feet Together Needs help to attain position but able to stand for 30 seconds with feet together    From Standing, Reach Forward with Outstretched Arm Can reach forward >12 cm safely (5")    From Standing Position, Pick up Object from Floor Able to pick up shoe safely and easily    From Standing Position, Turn to Look Behind Over each Shoulder Looks behind one side only/other side shows less weight shift    Turn 360 Degrees Able to turn 360 degrees safely one side only in 4 seconds or less    Standing Unsupported, Alternately Place Feet on Step/Stool Able to stand independently and complete 8 steps >20 seconds    Standing Unsupported, One Foot in [employer] balance while stepping or standing  Standing on One Leg Tries to lift leg/unable to hold 3 seconds but remains standing independently    Total Score 42      High Level Balance   High Level Balance Activities Negotitating around obstacles;Negotiating over obstacles;Tandem walking   cuing and CGA- min A     Self-Care   Self-Care --   discussed sleeping positioning     Knee/Hip Exercises: Aerobic   Nustep L 5 6 min      Knee/Hip Exercises: Machines for Strengthening   Cybex Knee Extension 5# 10x 2 sets    Cybex Knee Flexion 10# 10 x 2 sets      Knee/Hip Exercises: Standing   Walking with Sports Cord 20# 5x fwd, 5 x backward 3x laterally    Other Standing Knee Exercises HHA 1 foot on airex stepping over and back 10 x each , then laterally      Knee/Hip Exercises: Seated   Sit to Sand 10 reps;without UE support   on iarex CGA                      PT Short Term Goals - 07/28/21 1316       PT SHORT TERM GOAL #2   Title Gait pattern to improve to include more definitive heel strike/push off with toes as well as improved step/stride lengths     Status Achieved               PT Long Term Goals - 07/28/21 1323       PT LONG TERM GOAL #2   Title Berg score to improve to at least 50 to show reduced fall risk    Baseline 42/56    Status On-going                   Plan - 07/28/21 1350     Clinical Impression Statement BERG 42/56 so at increased risk for falls , focus session on BERG deficiet areas and issued HEP for tandem gait, tandem stance, SLS and static legs together stance at counter for balance- cuing and assistance needed . Progressing with goals    PT Treatment/Interventions ADLs/Self Care Home Management;Cryotherapy;Electrical Stimulation;Iontophoresis 4mg /ml Dexamethasone;Moist Heat;Ultrasound;DME Instruction;Gait training;Stair training;Functional mobility training;Therapeutic activities;Therapeutic exercise;Balance training;Neuromuscular re-education;Patient/family education;Manual techniques;Energy conservation;Taping;Vasopneumatic Device    PT Next Visit Plan Balance- high level and dynamic             Patient will benefit from skilled therapeutic intervention in order to improve the following deficits and impairments:  Abnormal gait, Difficulty walking, Cardiopulmonary status limiting activity, Obesity, Decreased activity tolerance, Decreased balance, Decreased knowledge of use of DME, Decreased mobility, Decreased strength  Visit Diagnosis: Muscle weakness (generalized)  Unsteadiness on feet  Difficulty in walking, not elsewhere classified     Problem List Patient Active Problem List   Diagnosis Date Noted   Arthritis of left knee 06/21/2021   COVID-19 virus infection 06/08/2021   Mild mitral regurgitation 12/14/2020   Hypercalcemia 10/12/2020   Posterior neck pain 10/12/2020   Heart murmur 10/12/2020   Rash 12/11/2019   Pre-ulcerative corn or callous 12/06/2018   Pre-diabetes 03/13/2018   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 [place], [place], [address] Phone: [phone]   Fax:  [phone]  Name: Paula [name] MRN: [id number] Date of Birth: 1946/02/05

## 2021-08-01 ENCOUNTER — Other Ambulatory Visit: Payer: Self-pay

## 2021-08-01 ENCOUNTER — Ambulatory Visit: Payer: Medicare Other | Admitting: Physical Therapy

## 2021-08-01 ENCOUNTER — Encounter: Payer: Self-pay | Admitting: Physical Therapy

## 2021-08-01 DIAGNOSIS — M6281 Muscle weakness (generalized): Secondary | ICD-10-CM | POA: Diagnosis not present

## 2021-08-01 DIAGNOSIS — R2681 Unsteadiness on feet: Secondary | ICD-10-CM | POA: Diagnosis not present

## 2021-08-01 DIAGNOSIS — G8929 Other chronic pain: Secondary | ICD-10-CM | POA: Diagnosis not present

## 2021-08-01 DIAGNOSIS — M25562 Pain in left knee: Secondary | ICD-10-CM | POA: Diagnosis not present

## 2021-08-01 DIAGNOSIS — R296 Repeated falls: Secondary | ICD-10-CM

## 2021-08-01 DIAGNOSIS — R262 Difficulty in walking, not elsewhere classified: Secondary | ICD-10-CM | POA: Diagnosis not present

## 2021-08-01 NOTE — Therapy (Signed)
[place]. [place], [place], [address] Phone: [phone]   Fax:  [phone]  Physical Therapy Treatment  Patient Details  Name: Paula [name] MRN: [id number] Date of Birth: 05/06/1946 Referring Provider (PT): Georgina Snell   Encounter Date: 08/01/2021   PT End of Session - 08/01/21 1356     Visit Number 8    Number of Visits 17    Date for PT Re-Evaluation 08/31/21    Authorization Type [employer] and [employer]    Authorization Time Period 07/06/21 to 08/31/21    Progress Note Due on Visit 10    PT Start Time 1315    PT Stop Time 1356    PT Time Calculation (min) 41 min    Activity Tolerance Patient tolerated treatment well    Behavior During Therapy [facility] for tasks assessed/performed             Past Medical History:  Diagnosis Date   Allergy    Arthritis    Asthma    Bipolar 1 disorder ([place])    Colon polyps    Depression    Diabetes mellitus without complication ([place])    GERD (gastroesophageal reflux disease)    History of alcohol abuse    History of chicken pox    History of drug abuse ([place])    Hyperlipidemia    Hypertension    Urine incontinence     Past Surgical History:  Procedure Laterality Date   APPENDECTOMY     BREAST BIOPSY     CHOLECYSTECTOMY     LUNG BIOPSY     SUBTOTAL COLECTOMY      There were no vitals filed for this visit.   Subjective Assessment - 08/01/21 1317     Subjective I feel like I really need to work on my balance, Angie gave me a lot to work on for this at home but I'm still concerned about it. I find my left leg is starting to get a big stronger.    Patient Stated Goals build strength in knee, no more falls    Currently in Pain? No/denies                               [facility] Adult PT Treatment/Exercise - 08/01/21 0001       Knee/Hip Exercises: Aerobic   Nustep L5 x6 minutes BLEs only      Knee/Hip Exercises: Standing   Heel Raises Both;1  set;15 reps    Heel Raises Limitations heel and toe raises    Step Down Both;1 set;Step Height: 4";Hand Hold: 2;15 reps    Wall Squat 1 set;10 reps    Gait Training farmers carry 3101ft with 6#    Other Standing Knee Exercises mini squat followed by double lunge on 4 inch step x5 rounds    Other Standing Knee Exercises hip hikes 1x10 without swing, 1x10 with swing B                 Balance Exercises - 08/01/21 0001       Balance Exercises: Standing   Tandem Stance Eyes open;Foam/compliant surface;3 reps;30 secs    Other Standing Exercises cone taps x1 each progressing to x2 taps each    Other Standing Exercises Comments stepping over obstacles side ways x2 rounds in // bars                PT Education -  08/01/21 1356     Education Details exercise form/purpose    Person(s) Educated Patient    Methods Explanation    Comprehension Verbalized understanding              PT Short Term Goals - 07/28/21 1316       PT SHORT TERM GOAL #2   Title Gait pattern to improve to include more definitive heel strike/push off with toes as well as improved step/stride lengths    Status Achieved               PT Long Term Goals - 07/28/21 1323       PT LONG TERM GOAL #2   Title Berg score to improve to at least 50 to show reduced fall risk    Baseline 42/56    Status On-going                   Plan - 08/01/21 1357     Clinical Impression Statement Paula [name] arrives doing well today, feels like she is getting stronger- has been able to do things with her left leg like step on a stair that she hasnt been able to do for some time, getting more concerned about her balance. Continued progressing Nustep, and continued working on [employer] strengthening today but also spent some time focusing on balance. Hip stabilizers are very weak and she does tend to compensate quite a bit.  I think she is doing well and making progress overall, will continue efforts.    Personal  Factors and Comorbidities Age;Fitness;Past/Current Experience;Time since onset of injury/illness/exacerbation;Comorbidity 2    Comorbidities hx covid, tardive dyskinesia (only effects jaw but meds have impact on her gait- shuffles more)    Examination-Activity Limitations Locomotion Level;Transfers;Bed Mobility;Squat;Stairs;Stand;Lift;Carry    Examination-Participation Restrictions Church;Cleaning;Community Activity;Driving;Shop;Laundry;Yard Work    [employer]chant [employer] officer Evolving/Moderate complexity    Clinical Decision Making Moderate    Rehab Potential Good    PT Frequency 2x / week    PT Duration 8 weeks    PT Treatment/Interventions ADLs/Self Care Home Management;Cryotherapy;Electrical Stimulation;Iontophoresis 4mg /ml Dexamethasone;Moist Heat;Ultrasound;DME Instruction;Gait training;Stair training;Functional mobility training;Therapeutic activities;Therapeutic exercise;Balance training;Neuromuscular re-education;Patient/family education;Manual techniques;Energy conservation;Taping;Vasopneumatic Device    PT Next Visit Plan Balance- high level and dynamic; especially work on obstacle clearance forwards and sideways    PT Home Exercise Plan [email or web address]    Consulted and Agree with Plan of Care Patient             Patient will benefit from skilled therapeutic intervention in order to improve the following deficits and impairments:  Abnormal gait, Difficulty walking, Cardiopulmonary status limiting activity, Obesity, Decreased activity tolerance, Decreased balance, Decreased knowledge of use of DME, Decreased mobility, Decreased strength  Visit Diagnosis: Muscle weakness (generalized)  Unsteadiness on feet  Difficulty in walking, not elsewhere classified  Repeated falls     Problem List Patient Active Problem List   Diagnosis Date Noted   Arthritis of left knee 06/21/2021   COVID-19 virus infection 06/08/2021   Mild mitral regurgitation 12/14/2020   Hypercalcemia  10/12/2020   Posterior neck pain 10/12/2020   Heart murmur 10/12/2020   Rash 12/11/2019   Pre-ulcerative corn or callous 12/06/2018   Pre-diabetes 03/13/2018   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 [place], [place], [address] Phone: [phone]   Fax:  [phone]  Name: ALBERTO SCHOCH MRN: [id number] Date of Birth: [birth year]

## 2021-08-04 ENCOUNTER — Encounter: Payer: Self-pay | Admitting: Physical Therapy

## 2021-08-04 ENCOUNTER — Other Ambulatory Visit: Payer: Self-pay

## 2021-08-04 ENCOUNTER — Ambulatory Visit: Payer: Medicare Other | Attending: Family Medicine | Admitting: Physical Therapy

## 2021-08-04 DIAGNOSIS — R2681 Unsteadiness on feet: Secondary | ICD-10-CM | POA: Diagnosis not present

## 2021-08-04 DIAGNOSIS — R296 Repeated falls: Secondary | ICD-10-CM

## 2021-08-04 DIAGNOSIS — R262 Difficulty in walking, not elsewhere classified: Secondary | ICD-10-CM

## 2021-08-04 DIAGNOSIS — M6281 Muscle weakness (generalized): Secondary | ICD-10-CM | POA: Diagnosis not present

## 2021-08-04 NOTE — Therapy (Signed)
[facility] ?[facility] ?[address] ?[place], [place], [address] ?Phone: [phone]   Fax:  [phone] ? ?Physical Therapy Treatment ? ?Patient Details  ?Name: Paula [name] ?MRN: [id number] ?Date of Birth: 1946-03-18 ?Referring Provider (PT): Georgina Snell ? ? ?Encounter Date: 08/04/2021 ? ? PT End of Session - 08/04/21 1404   ? ? Visit Number 9   ? Number of Visits 17   ? Date for PT Re-Evaluation 08/31/21   ? Authorization Type [employer] and [employer]   ? Authorization Time Period 07/06/21 to 08/31/21   ? Progress Note Due on Visit 10   ? PT Start Time 1318   ? PT Stop Time 6962   ? PT Time Calculation (min) 39 min   ? Activity Tolerance Patient tolerated treatment well   ? Behavior During Therapy [facility] for tasks assessed/performed   ? ?  ?  ? ?  ? ? ?Past Medical History:  ?Diagnosis Date  ? Allergy   ? Arthritis   ? Asthma   ? Bipolar 1 disorder ([place])   ? Colon polyps   ? Depression   ? Diabetes mellitus without complication ([place])   ? GERD (gastroesophageal reflux disease)   ? History of alcohol abuse   ? History of chicken pox   ? History of drug abuse ([place])   ? Hyperlipidemia   ? Hypertension   ? Urine incontinence   ? ? ?Past Surgical History:  ?Procedure Laterality Date  ? APPENDECTOMY    ? BREAST BIOPSY    ? CHOLECYSTECTOMY    ? LUNG BIOPSY    ? SUBTOTAL COLECTOMY    ? ? ?There were no vitals filed for this visit. ? ? Subjective Assessment - 08/04/21 1321   ? ? Subjective I was pretty sore after last session but it got better after a couple of days. I might be getting better at balance, I have been working on those exercises at home. Would still like to focus on balance, this is my hardest thing right now.   ? Patient Stated Goals build strength in knee, no more falls   ? Currently in Pain? No/denies   ? ?  ?  ? ?  ? ? ? ? ? ? ? ? ? ? ? ? ? ? ? ? ? ? ? ? [facility] Adult PT Treatment/Exercise - 08/04/21 0001   ? ?  ? Knee/Hip Exercises: Aerobic  ? Recumbent Bike L5 x6 minutes   ?  ?  Knee/Hip Exercises: Standing  ? Heel Raises Both;1 set;20 reps   ? Heel Raises Limitations heel and toe raises   ? Forward Lunges Both;1 set;10 reps   ? Forward Lunges Limitations 6 inch step   ? Forward Step Up 1 set;10 reps;Hand Hold: 2;Step Height: 6";Right   ? Forward Step Up Limitations with R LE; had to do 1x15 on L LE due to knee pain   ? Wall Squat 1 set;15 reps   ? Gait Training farmers carry 31ft with 6#   ? ?  ?  ? ?  ? ? ? ? ? ? Balance Exercises - 08/04/21 0001   ? ?  ? Balance Exercises: Standing  ? Standing Eyes Closed Narrow base of support (BOS);1 rep   x60 seconds  ? SLS Eyes open;Solid surface;3 reps;15 secs   light toe touch on 4 inch box for a bit of extra support  ? Other Standing Exercises stepping over obstacles forward and sideways on solid  surface x4 rounds each; box taps from SLS 3 rounds x5 taps each side   ? ?  ?  ? ?  ? ? ? ? ? PT Education - 08/04/21 1404   ? ? Education Details reassess/progress note and POC planning next session   ? Person(s) Educated Patient   ? Methods Explanation   ? Comprehension Verbalized understanding   ? ?  ?  ? ?  ? ? ? PT Short Term Goals - 07/28/21 1316   ? ?  ? PT SHORT TERM GOAL #2  ? Title Gait pattern to improve to include more definitive heel strike/push off with toes as well as improved step/stride lengths   ? Status Achieved   ? ?  ?  ? ?  ? ? ? ? PT Long Term Goals - 07/28/21 1323   ? ?  ? PT LONG TERM GOAL #2  ? Title Berg score to improve to at least 50 to show reduced fall risk   ? Baseline 42/56   ? Status On-going   ? ?  ?  ? ?  ? ? ? ? ? ? ? ? Plan - 08/04/21 1404   ? ? Clinical Impression Statement Paula [name] arrives today doing well, has some muscle soreness after her session last time; has been working on balance exercises at home and feels they are a little better but this is still a big concern of hers. Continued with functional strength and balance training with progressions as tolerated, we were a bit limited by ongoing L knee pain but we  were able to modify exercises without difficulty. Will need a progress note per insurance requirements next session.   ? Personal Factors and Comorbidities Age;Fitness;Past/Current Experience;Time since onset of injury/illness/exacerbation;Comorbidity 2   ? Comorbidities hx covid, tardive dyskinesia (only effects jaw but meds have impact on her gait- shuffles more)   ? Examination-Activity Limitations Locomotion Level;Transfers;Bed Mobility;Squat;Stairs;Stand;Lift;Carry   ? Examination-Participation Restrictions Church;Cleaning;Community Activity;Driving;Shop;Laundry;[place] [employer]aver Work   ? Stability/Clinical Decision Making Evolving/Moderate complexity   ? Clinical Decision Making Moderate   ? Rehab Potential Good   ? PT Frequency 2x / week   ? PT Duration 8 weeks   ? PT Treatment/Interventions ADLs/Self Care Home Management;Cryotherapy;Electrical Stimulation;Iontophoresis 4mg /ml Dexamethasone;Moist Heat;Ultrasound;DME Instruction;Gait training;Stair training;Functional mobility training;Therapeutic activities;Therapeutic exercise;Balance training;Neuromuscular re-education;Patient/family education;Manual techniques;Energy conservation;Taping;Vasopneumatic Device   ? PT Next Visit Plan needs progress note   ? PT Home Exercise Plan [id number]   ? Consulted and Agree with Plan of Care Patient   ? ?  ?  ? ?  ? ? ?Patient will benefit from skilled therapeutic intervention in order to improve the following deficits and impairments:  Abnormal gait, Difficulty walking, Cardiopulmonary status limiting activity, Obesity, Decreased activity tolerance, Decreased balance, Decreased knowledge of use of DME, Decreased mobility, Decreased strength ? ?Visit Diagnosis: ?Muscle weakness (generalized) ? ?Difficulty in walking, not elsewhere classified ? ?Unsteadiness on feet ? ?Repeated falls ? ? ? ? ?Problem List ?Patient Active Problem List  ? Diagnosis Date Noted  ? Arthritis of left knee 06/21/2021  ? COVID-19 virus infection 06/08/2021  ?  Mild mitral regurgitation 12/14/2020  ? Hypercalcemia 10/12/2020  ? Posterior neck pain 10/12/2020  ? Heart murmur 10/12/2020  ? Rash 12/11/2019  ? Pre-ulcerative corn or callous 12/06/2018  ? Pre-diabetes 03/13/2018  ? 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 CKD (chronic kidney disease) stage 3, GFR 30-59 ml/min ([facility]) 08/07/2017  ? Essential hypertension 11/09/2016  ? Mixed hyperlipidemia 11/09/2016  ?  Bipolar 2 disorder ([place]) 11/09/2016  ? Overactive bladder 11/09/2016  ? COPD  GOLD 0  11/09/2016  ? ?Liberty Seto U PT, DPT, PN2  ? ?Supplemental Physical Therapist ?[facility]  ? ? ? ? ? ?[place] ?[place] ?[address] ?[place], [place], [address] ?Phone: [phone]   Fax:  [phone] ? ?Name: Paula [name] ?MRN: [id number] ?Date of Birth: Jul 06, 1945 ? ? ? ?

## 2021-08-06 ENCOUNTER — Other Ambulatory Visit: Payer: Self-pay | Admitting: Internal Medicine

## 2021-08-07 NOTE — Telephone Encounter (Signed)
Please refill as per office routine med refill policy (all routine meds to be refilled for 3 mo or monthly (per pt preference) up to one year from last visit, then month to month grace period for 3 mo, then further med refills will have to be denied) ? ?

## 2021-08-08 ENCOUNTER — Encounter: Payer: Self-pay | Admitting: Family Medicine

## 2021-08-08 ENCOUNTER — Other Ambulatory Visit: Payer: Self-pay

## 2021-08-08 ENCOUNTER — Ambulatory Visit (INDEPENDENT_AMBULATORY_CARE_PROVIDER_SITE_OTHER): Payer: Medicare Other | Admitting: Family Medicine

## 2021-08-08 VITALS — BP 120/68 | HR 83 | Ht 64.0 in | Wt 180.4 lb

## 2021-08-08 DIAGNOSIS — M25562 Pain in left knee: Secondary | ICD-10-CM | POA: Diagnosis not present

## 2021-08-08 DIAGNOSIS — G8929 Other chronic pain: Secondary | ICD-10-CM | POA: Diagnosis not present

## 2021-08-08 DIAGNOSIS — M1712 Unilateral primary osteoarthritis, left knee: Secondary | ICD-10-CM | POA: Diagnosis not present

## 2021-08-08 DIAGNOSIS — R2689 Other abnormalities of gait and mobility: Secondary | ICD-10-CM

## 2021-08-08 NOTE — Patient Instructions (Signed)
Good to see you.   ? ?Glad you're doing better. ? ?Con't PT and your home exercises. ? ?We can do another cortisone injection or gel shots in the future if needed. ? ?Follow-up as needed ? ?

## 2021-08-08 NOTE — Progress Notes (Signed)
? ?  I, Paula [name], LAT, ATC, am serving as scribe for Dr. [name]. ? ?Paula [name] is a 76 y.o. female who presents to [facility] today for f/u chronic L knee pain thought to be due to medial compartment DJD and unsteady gait. Pt was last seen by Dr. Georgina Snell on 06/27/21 and was given a L knee steroid injection and advised to use Voltaren gel and Tylenol arthritis. Pt was also referred to PT esp to work on her shuffling gait, completing 9 visits. Today, pt reports that overall her L knee is feeling better.  She has another month's worth of PT visits and they have provided her w/ a HEP.   ? ?Dx imaging: 06/27/21 L knee XR ? ?Pertinent review of systems: No fevers or chills ? ?Relevant historical information: Hypertension.  Tardive dyskinesia and bipolar ? ? ?Exam:  ?BP 120/68 (BP Location: Right Arm, Patient Position: Sitting, Cuff Size: Normal)   Pulse 83   Ht '5\' 4"'$  (1.626 m)   Wt 180 lb 6.4 oz (81.8 kg)   SpO2 94%   BMI 30.97 kg/m?  ?General: Well Developed, well nourished, and in no acute distress.  ? ?MSK: Left knee: Normal motion with crepitation. ?Tender to palpation at medial joint line. ? ? ? ?Lab and Radiology Results ? ?EXAM: ?LEFT KNEE 3 VIEWS ?  ?COMPARISON:  None. ?  ?FINDINGS: ?Osteopenia. No acute fracture or dislocation. Severe degenerative ?changes of the medial compartment with near bone-on-bone apposition, ?subchondral cyst formation, osteophyte formation and osseous ?remodeling. Mild degenerative changes of the patellofemoral ?compartment. No area of erosion or osseous destruction. No ?unexpected radiopaque foreign body. Trace knee joint effusion. ?  ?IMPRESSION: ?Severe degenerative changes of the medial compartment. ?  ?  ?Electronically Signed ?  By: Valentino Saxon M.D. ?  On: 06/28/2021 07:54 ?I, [name], personally (independently) visualized and performed the interpretation of the images attached in this note. ? ? ? ? ? ?Assessment and  Plan: ?76 y.o. female with left knee pain due to medial compartment DJD. ?Improved with steroid injection and quad strengthening.  Additionally she has been having falls which are reducing with physical therapy to work on gait as well. ? ?Overall she is improved.  Plan to continue and finish out physical therapy.  Recheck back with me as needed.  I can repeat steroid injections every 3 months.  Her 67-monthmark would be April 23.  If her pain should return sooner she will let me know and we can authorize hyaluronic acid injections in our clinic. ? ? ?Discussed warning signs or symptoms. Please see discharge instructions. Patient expresses understanding. ? ? ?The above documentation has been reviewed and is accurate and complete ELynne Leader M.D. ? ? ?

## 2021-08-09 ENCOUNTER — Ambulatory Visit: Payer: Medicare Other | Admitting: Physical Therapy

## 2021-08-09 DIAGNOSIS — R296 Repeated falls: Secondary | ICD-10-CM | POA: Diagnosis not present

## 2021-08-09 DIAGNOSIS — R2681 Unsteadiness on feet: Secondary | ICD-10-CM | POA: Diagnosis not present

## 2021-08-09 DIAGNOSIS — R262 Difficulty in walking, not elsewhere classified: Secondary | ICD-10-CM

## 2021-08-09 DIAGNOSIS — M6281 Muscle weakness (generalized): Secondary | ICD-10-CM | POA: Diagnosis not present

## 2021-08-09 NOTE — Therapy (Signed)
[facility] ?[address] ?[place], [place], [address] ?Phone: [phone]   Fax:  [phone] ? ?Physical Therapy Treatment ? ?Patient Details  ?Name: Paula [name] ?MRN: [id number] ?Date of Birth: Mar 11, 1946 ?Referring Provider (PT): Georgina Snell ? ? ?Encounter Date: 08/09/2021 ? ? PT End of Session - 08/09/21 1431   ? ? Visit Number 10   ? Number of Visits 17   ? Date for PT Re-Evaluation 08/31/21   ? Authorization Type [employer] and [employer]   ? Authorization Time Period 07/06/21 to 08/31/21   ? PT Start Time 1448   ? PT Stop Time 1856   ? PT Time Calculation (min) 50 min   ? ?  ?  ? ?  ? ? ?Past Medical History:  ?Diagnosis Date  ? Allergy   ? Arthritis   ? Asthma   ? Bipolar 1 disorder ([place])   ? Colon polyps   ? Depression   ? Diabetes mellitus without complication ([place])   ? GERD (gastroesophageal reflux disease)   ? History of alcohol abuse   ? History of chicken pox   ? History of drug abuse ([place])   ? Hyperlipidemia   ? Hypertension   ? Urine incontinence   ? ? ?Past Surgical History:  ?Procedure Laterality Date  ? APPENDECTOMY    ? BREAST BIOPSY    ? CHOLECYSTECTOMY    ? LUNG BIOPSY    ? SUBTOTAL COLECTOMY    ? ? ?There were no vitals filed for this visit. ? ? Subjective Assessment - 08/09/21 1359   ? ? Subjective fair to midline. normal aches and pains   ? ?  ?  ? ?  ? ? ? ? ? ? ? ? ? ? ? ? ? ? ? ? ? ? ? ? [facility] Adult PT Treatment/Exercise - 08/09/21 0001   ? ?  ? High Level Balance  ? High Level Balance Activities Side stepping;Negotitating around obstacles;Negotiating over obstacles;Backward walking;Marching forwards   ball toss on airex and ball kicks on groundCGA  ? High Level Balance Comments walking fwd,back and SW on large foam mat   agility ladder. ball toss with walking  ?  ? Knee/Hip Exercises: Aerobic  ? Recumbent Bike L5 x6 minutes   ?  ? Knee/Hip Exercises: Machines for Strengthening  ? Cybex Knee Extension 5# 10x 2 sets   ? Cybex Knee Flexion 10# 10 x 2  sets   ?  ? Knee/Hip Exercises: Standing  ? Lateral Step Up Both;5 sets   up and over each way  ? Forward Step Up Both;5 reps;Hand Hold: 0;Step Height: 4"   alt step tap 20 x CGA  ? Walking with Sports Cord 20# 5x fwd, 5 x backward 3x laterally   ?  ? Knee/Hip Exercises: Seated  ? Sit to Sand 10 reps;without UE support   on airex CGA  ? ?  ?  ? ?  ? ? ? ? ? ? ? ? ? ? ? ? PT Short Term Goals - 08/09/21 1430   ? ?  ? PT SHORT TERM GOAL #1  ? Title Will be compliant with appropriate progressive HEP to be updated PRN   ? Status Achieved   ?  ? PT SHORT TERM GOAL #2  ? Title Gait pattern to improve to include more definitive heel strike/push off with toes as well as improved step/stride lengths   ? Status Achieved   ?  ? PT  SHORT TERM GOAL #3  ? Title Will be able to name at least 3 ways to reduce fall risk at home and in community   ? Status Achieved   ?  ? PT SHORT TERM GOAL #4  ? Title Will report no more episodes of L knee buckling/feeling like it will collapse within the past 2 weeks   ? Status Achieved   ? ?  ?  ? ?  ? ? ? ? PT Long Term Goals - 08/09/21 1430   ? ?  ? PT LONG TERM GOAL #1  ? Title MMT to have improved by at least 1 grade in all tested groups in order to improve strength and reduce fall risk   ? Status Partially Met   ?  ? PT LONG TERM GOAL #2  ? Title Berg score to improve to at least 50 to show reduced fall risk   ? Status On-going   ?  ? PT LONG TERM GOAL #3  ? Title Will be able to ascend/descend steps with U rail and step over step pattern with no L knee pain or weakness in order to reduce fall risk/show improved community access   ? Status Partially Met   ?  ? PT LONG TERM GOAL #4  ? Title Will be compliant with gym based HEP versus advanced home HEP to maintain functional gains from therapy and prevent recurrence of problem   ? Status On-going   ? ?  ?  ? ?  ? ? ? ? ? ? ? ? Plan - 08/09/21 1431   ? ? Clinical Impression Statement STG met. Progressing with LTGS. High Level Dynamic balance  actvities with assistance and cuing but did very well. Pt has bene making steady progress with therapy but still feels balance is not where is would like it and BERG puts her at fall risk. pt will benefit from continued skilled therapy to continue workimg on safety and minimizing fall risk   ? PT Treatment/Interventions ADLs/Self Care Home Management;Cryotherapy;Electrical Stimulation;Iontophoresis 93m/ml Dexamethasone;Moist Heat;Ultrasound;DME Instruction;Gait training;Stair training;Functional mobility training;Therapeutic activities;Therapeutic exercise;Balance training;Neuromuscular re-education;Patient/family education;Manual techniques;Energy conservation;Taping;Vasopneumatic Device   ? PT Next Visit Plan 10th visit done. progress balance   ? ?  ?  ? ?  ? ? ?Patient will benefit from skilled therapeutic intervention in order to improve the following deficits and impairments:  Abnormal gait, Difficulty walking, Cardiopulmonary status limiting activity, Obesity, Decreased activity tolerance, Decreased balance, Decreased knowledge of use of DME, Decreased mobility, Decreased strength ? ?Visit Diagnosis: ?Muscle weakness (generalized) ? ?Difficulty in walking, not elsewhere classified ? ?Unsteadiness on feet ? ? ? ? ?Problem List ?Patient Active Problem List  ? Diagnosis Date Noted  ? Chronic pain of left knee 08/08/2021  ? Primary osteoarthritis of left knee 06/21/2021  ? COVID-19 virus infection 06/08/2021  ? Mild mitral regurgitation 12/14/2020  ? Hypercalcemia 10/12/2020  ? Posterior neck pain 10/12/2020  ? Heart murmur 10/12/2020  ? Rash 12/11/2019  ? Pre-ulcerative corn or callous 12/06/2018  ? Pre-diabetes 03/13/2018  ? 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 CKD (chronic kidney disease) stage 3, GFR 30-59 ml/min (HCC) 08/07/2017  ? Essential hypertension 11/09/2016  ? Mixed hyperlipidemia 11/09/2016  ? Bipolar 2 disorder ([place] 11/09/2016  ? Overactive bladder 11/09/2016  ? COPD  GOLD  0  11/09/2016  ? ? ?Paula [name],Paula [name], PTA ?08/09/2021, 2:35 PM ? ?[place] ?[address] ?[place] [address]?Phone: [phone]  Fax:  [phone] ? ?Name: Paula [name] ?MRN: [id number] ?Date of Birth: 1945-06-07 ? ? ? ?

## 2021-08-11 ENCOUNTER — Other Ambulatory Visit: Payer: Self-pay

## 2021-08-11 ENCOUNTER — Ambulatory Visit: Payer: Medicare Other | Admitting: Physical Therapy

## 2021-08-11 DIAGNOSIS — R296 Repeated falls: Secondary | ICD-10-CM | POA: Diagnosis not present

## 2021-08-11 DIAGNOSIS — R262 Difficulty in walking, not elsewhere classified: Secondary | ICD-10-CM

## 2021-08-11 DIAGNOSIS — R2681 Unsteadiness on feet: Secondary | ICD-10-CM

## 2021-08-11 DIAGNOSIS — M6281 Muscle weakness (generalized): Secondary | ICD-10-CM

## 2021-08-11 NOTE — Therapy (Signed)
[facility] ?[place] [address] ?[place], [place], [address] ?Phone: [phone]   Fax:  [phone] ? ?Physical Therapy Treatment ? ?Patient Details  ?Name: Paula [name] ?MRN: [id number] ?Date of Birth: 1946-01-23 ?Referring Provider (PT): Georgina Snell ? ? ?Encounter Date: 08/11/2021 ? ? PT End of Session - 08/11/21 1436   ? ? Visit Number 11   ? Number of Visits 17   ? Date for PT Re-Evaluation 08/31/21   ? Authorization Type [employer] and AARP   ? Authorization Time Period 07/06/21 to 08/31/21   ? PT Start Time 1358   ? PT Stop Time 5573   ? PT Time Calculation (min) 39 min   ? ?  ?  ? ?  ? ? ?Past Medical History:  ?Diagnosis Date  ? Allergy   ? Arthritis   ? Asthma   ? Bipolar 1 disorder ([place])   ? Colon polyps   ? Depression   ? Diabetes mellitus without complication ([place])   ? GERD (gastroesophageal reflux disease)   ? History of alcohol abuse   ? History of chicken pox   ? History of drug abuse ([place])   ? Hyperlipidemia   ? Hypertension   ? Urine incontinence   ? ? ?Past Surgical History:  ?Procedure Laterality Date  ? APPENDECTOMY    ? BREAST BIOPSY    ? CHOLECYSTECTOMY    ? LUNG BIOPSY    ? SUBTOTAL COLECTOMY    ? ? ?There were no vitals filed for this visit. ? ? Subjective Assessment - 08/11/21 1400   ? ? Subjective very very sore after last session around the middle- maybe from resisted walking   ? ?  ?  ? ?  ? ? ? ? ? ? ? ? ? ? ? ? ? ? ? ? ? ? ? ? [facility] Adult PT Treatment/Exercise - 08/11/21 0001   ? ?  ? Ambulation/Gait  ? Pre-Gait Activities stairs step over step up and down 2 rail then 1 rail   ? Gait Comments amb 500 + feet outside all terrains including curbs with cuing to get close to curb before stepping, step through gait but left is slower to advance   ?  ? High Level Balance  ? High Level Balance Activities Negotitating around obstacles;Negotiating over obstacles   ? High Level Balance Comments walking fwd,back and SW and tandem on large foam mat   ?  ?  Knee/Hip Exercises: Aerobic  ? Recumbent Bike 5 min   ?  ? Knee/Hip Exercises: Seated  ? Sit to Sand 10 reps;without UE support   ?  ? Knee/Hip Exercises: Supine  ? Quad Sets --   on airex  ? ?  ?  ? ?  ? ? ? ? ? ? ? ? ? ? ? ? PT Short Term Goals - 08/09/21 1430   ? ?  ? PT SHORT TERM GOAL #1  ? Title Will be compliant with appropriate progressive HEP to be updated PRN   ? Status Achieved   ?  ? PT SHORT TERM GOAL #2  ? Title Gait pattern to improve to include more definitive heel strike/push off with toes as well as improved step/stride lengths   ? Status Achieved   ?  ? PT SHORT TERM GOAL #3  ? Title Will be able to name at least 3 ways to reduce fall risk at home and in community   ? Status Achieved   ?  ?  PT SHORT TERM GOAL #4  ? Title Will report no more episodes of L knee buckling/feeling like it will collapse within the past 2 weeks   ? Status Achieved   ? ?  ?  ? ?  ? ? ? ? PT Long Term Goals - 08/09/21 1430   ? ?  ? PT LONG TERM GOAL #1  ? Title MMT to have improved by at least 1 grade in all tested groups in order to improve strength and reduce fall risk   ? Status Partially Met   ?  ? PT LONG TERM GOAL #2  ? Title Berg score to improve to at least 50 to show reduced fall risk   ? Status On-going   ?  ? PT LONG TERM GOAL #3  ? Title Will be able to ascend/descend steps with U rail and step over step pattern with no L knee pain or weakness in order to reduce fall risk/show improved community access   ? Status Partially Met   ?  ? PT LONG TERM GOAL #4  ? Title Will be compliant with gym based HEP versus advanced home HEP to maintain functional gains from therapy and prevent recurrence of problem   ? Status On-going   ? ?  ?  ? ?  ? ? ? ? ? ? ? ? Plan - 08/11/21 1436   ? ? Clinical Impression Statement focus session on gait uneven terrain, transitioning surfaces w,curbs and stairs, cuing needed with curns and stepping to get closer. step through gait but left leg is slower to advance, continued with high  level balance ex   ? PT Treatment/Interventions ADLs/Self Care Home Management;Cryotherapy;Electrical Stimulation;Iontophoresis 29m/ml Dexamethasone;Moist Heat;Ultrasound;DME Instruction;Gait training;Stair training;Functional mobility training;Therapeutic activities;Therapeutic exercise;Balance training;Neuromuscular re-education;Patient/family education;Manual techniques;Energy conservation;Taping;Vasopneumatic Device   ? PT Next Visit Plan balance and func gait actvities   ? ?  ?  ? ?  ? ? ?Patient will benefit from skilled therapeutic intervention in order to improve the following deficits and impairments:  Abnormal gait, Difficulty walking, Cardiopulmonary status limiting activity, Obesity, Decreased activity tolerance, Decreased balance, Decreased knowledge of use of DME, Decreased mobility, Decreased strength ? ?Visit Diagnosis: ?Muscle weakness (generalized) ? ?Difficulty in walking, not elsewhere classified ? ?Unsteadiness on feet ? ? ? ? ?Problem List ?Patient Active Problem List  ? Diagnosis Date Noted  ? Chronic pain of left knee 08/08/2021  ? Primary osteoarthritis of left knee 06/21/2021  ? COVID-19 virus infection 06/08/2021  ? Mild mitral regurgitation 12/14/2020  ? Hypercalcemia 10/12/2020  ? Posterior neck pain 10/12/2020  ? Heart murmur 10/12/2020  ? Rash 12/11/2019  ? Pre-ulcerative corn or callous 12/06/2018  ? Pre-diabetes 03/13/2018  ? 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 CKD (chronic kidney disease) stage 3, GFR 30-59 ml/min (HCC) 08/07/2017  ? Essential hypertension 11/09/2016  ? Mixed hyperlipidemia 11/09/2016  ? Bipolar 2 disorder ([place] [place] 11/09/2016  ? Overactive bladder 11/09/2016  ? COPD  GOLD 0  11/09/2016  ? ? ?Wreatha Sturgeon,ANGIE, PTA ?08/11/2021, 2:38 PM ? ?[place] ?[place] ?[place] [address]?Phone: [phone]  Fax:  [phone]? ?Name: BKARYSSA AMARAL?MRN: [id number]?Date of Birth:  [birth year]? ? ? ?

## 2021-08-15 ENCOUNTER — Encounter: Payer: Self-pay | Admitting: Physical Therapy

## 2021-08-15 ENCOUNTER — Other Ambulatory Visit: Payer: Self-pay

## 2021-08-15 ENCOUNTER — Ambulatory Visit: Payer: Medicare Other | Admitting: Physical Therapy

## 2021-08-15 DIAGNOSIS — R296 Repeated falls: Secondary | ICD-10-CM | POA: Diagnosis not present

## 2021-08-15 DIAGNOSIS — R2681 Unsteadiness on feet: Secondary | ICD-10-CM

## 2021-08-15 DIAGNOSIS — M6281 Muscle weakness (generalized): Secondary | ICD-10-CM

## 2021-08-15 DIAGNOSIS — R262 Difficulty in walking, not elsewhere classified: Secondary | ICD-10-CM | POA: Diagnosis not present

## 2021-08-15 NOTE — Therapy (Signed)
[facility] ?[facility] [name] ?[place], [place], [address] ?Phone: [phone]   Fax:  [phone] ? ?Physical Therapy Treatment ? ?Patient Details  ?Name: Paula [name] ?MRN: [id number] ?Date of Birth: 31-Mar-1946 ?Referring Provider (PT): Georgina Snell ? ? ?Encounter Date: 08/15/2021 ? ? PT End of Session - 08/15/21 1613   ? ? Visit Number 12   ? Number of Visits 17   ? Date for PT Re-Evaluation 08/31/21   ? Authorization Type [employer] and AARP   ? Authorization Time Period 07/06/21 to 08/31/21   ? Progress Note Due on Visit 20   ? PT Start Time 1533   ? PT Stop Time 3875   ? PT Time Calculation (min) 40 min   ? Activity Tolerance Patient tolerated treatment well   ? Behavior During Therapy [facility] for tasks assessed/performed   ? ?  ?  ? ?  ? ? ?Past Medical History:  ?Diagnosis Date  ? Allergy   ? Arthritis   ? Asthma   ? Bipolar 1 disorder ([place])   ? Colon polyps   ? Depression   ? Diabetes mellitus without complication ([place])   ? GERD (gastroesophageal reflux disease)   ? History of alcohol abuse   ? History of chicken pox   ? History of drug abuse ([place])   ? Hyperlipidemia   ? Hypertension   ? Urine incontinence   ? ? ?Past Surgical History:  ?Procedure Laterality Date  ? APPENDECTOMY    ? BREAST BIOPSY    ? CHOLECYSTECTOMY    ? LUNG BIOPSY    ? SUBTOTAL COLECTOMY    ? ? ?There were no vitals filed for this visit. ? ? Subjective Assessment - 08/15/21 1534   ? ? Subjective I'm doing OK, I was really sore a couple of sessions ago better now. Just getting used to the time change   ? Patient Stated Goals build strength in knee, no more falls   ? Currently in Pain? No/denies   ? ?  ?  ? ?  ? ? ? ? ? ? ? ? ? ? ? ? ? ? ? ? ? ? ? ? [facility] Adult PT Treatment/Exercise - 08/15/21 0001   ? ?  ? Knee/Hip Exercises: Aerobic  ? Nustep L6 x5 minutes BLEs only   ?  ? Knee/Hip Exercises: Standing  ? Forward Step Up Both;2 sets;5 reps;Hand Hold: 2;Step Height: 6"   ?  ? Knee/Hip Exercises: Seated   ? Other Seated Knee/Hip Exercises sitting step overs 1x10 B   ? ?  ?  ? ?  ? ? ? ? ? ? Balance Exercises - 08/15/21 0001   ? ?  ? Balance Exercises: Standing  ? Standing Eyes Opened Narrow base of support (BOS);3 reps   60 seconds  ? Tandem Stance Eyes open;Foam/compliant surface;3 reps;30 secs   ? Other Standing Exercises narrow BOS with horizontal and vertical head turns x30 each solid surface   ? Other Standing Exercises Comments wall rebounds 1x20 for anlke strength and balance recovery; standing on blue foam pad with alternating toe taps 1x20   ? ?  ?  ? ?  ? ? ? ? ? PT Education - 08/15/21 1613   ? ? Education Details exericse form and purpose, sitting step over exercises at home   ? Person(s) Educated Patient   ? Methods Explanation   ? Comprehension Verbalized understanding   ? ?  ?  ? ?  ? ? ?  PT Short Term Goals - 08/09/21 1430   ? ?  ? PT SHORT TERM GOAL #1  ? Title Will be compliant with appropriate progressive HEP to be updated PRN   ? Status Achieved   ?  ? PT SHORT TERM GOAL #2  ? Title Gait pattern to improve to include more definitive heel strike/push off with toes as well as improved step/stride lengths   ? Status Achieved   ?  ? PT SHORT TERM GOAL #3  ? Title Will be able to name at least 3 ways to reduce fall risk at home and in community   ? Status Achieved   ?  ? PT SHORT TERM GOAL #4  ? Title Will report no more episodes of L knee buckling/feeling like it will collapse within the past 2 weeks   ? Status Achieved   ? ?  ?  ? ?  ? ? ? ? PT Long Term Goals - 08/09/21 1430   ? ?  ? PT LONG TERM GOAL #1  ? Title MMT to have improved by at least 1 grade in all tested groups in order to improve strength and reduce fall risk   ? Status Partially Met   ?  ? PT LONG TERM GOAL #2  ? Title Berg score to improve to at least 50 to show reduced fall risk   ? Status On-going   ?  ? PT LONG TERM GOAL #3  ? Title Will be able to ascend/descend steps with U rail and step over step pattern with no L knee pain or  weakness in order to reduce fall risk/show improved community access   ? Status Partially Met   ?  ? PT LONG TERM GOAL #4  ? Title Will be compliant with gym based HEP versus advanced home HEP to maintain functional gains from therapy and prevent recurrence of problem   ? Status On-going   ? ?  ?  ? ?  ? ? ? ? ? ? ? ? Plan - 08/15/21 1613   ? ? Clinical Impression Statement Paula [name] arrives today doing OK, not having any pain but balance remains a concern. We spent a lot of time working on balance, also progressed Nustep training for improved strength today too. I think her balance is slowly getting a bit better as we go along. Still tends to have shuffling pattern possibly due to meds for tardive dyskinesia, we still need to continue to work on this.  Will continue to progress as able and tolerated.   ? Personal Factors and Comorbidities Age;Fitness;Past/Current Experience;Time since onset of injury/illness/exacerbation;Comorbidity 2   ? Comorbidities hx covid, tardive dyskinesia (only effects jaw but meds have impact on her gait- shuffles more)   ? Examination-Activity Limitations Locomotion Level;Transfers;Bed Mobility;Squat;Stairs;Stand;Lift;Carry   ? Examination-Participation Restrictions Church;Cleaning;Community Activity;Driving;Shop;Laundry;[employer] Work   ? Stability/Clinical Decision Making Evolving/Moderate complexity   ? Clinical Decision Making Moderate   ? Rehab Potential Good   ? PT Frequency 2x / week   ? PT Duration 8 weeks   ? PT Treatment/Interventions ADLs/Self Care Home Management;Cryotherapy;Electrical Stimulation;Iontophoresis 71m/ml Dexamethasone;Moist Heat;Ultrasound;DME Instruction;Gait training;Stair training;Functional mobility training;Therapeutic activities;Therapeutic exercise;Balance training;Neuromuscular re-education;Patient/family education;Manual techniques;Energy conservation;Taping;Vasopneumatic Device   ? PT Next Visit Plan balance and func gait actvities   ? PT Home Exercise Plan  [id number] sitting step overs   ? Consulted and Agree with Plan of Care Patient   ? ?  ?  ? ?  ? ? ?Patient will benefit from  skilled therapeutic intervention in order to improve the following deficits and impairments:  Abnormal gait, Difficulty walking, Cardiopulmonary status limiting activity, Obesity, Decreased activity tolerance, Decreased balance, Decreased knowledge of use of DME, Decreased mobility, Decreased strength ? ?Visit Diagnosis: ?Muscle weakness (generalized) ? ?Difficulty in walking, not elsewhere classified ? ?Unsteadiness on feet ? ?Repeated falls ? ? ? ? ?Problem List ?Patient Active Problem List  ? Diagnosis Date Noted  ? Chronic pain of left knee 08/08/2021  ? Primary osteoarthritis of left knee 06/21/2021  ? COVID-19 virus infection 06/08/2021  ? Mild mitral regurgitation 12/14/2020  ? Hypercalcemia 10/12/2020  ? Posterior neck pain 10/12/2020  ? Heart murmur 10/12/2020  ? Rash 12/11/2019  ? Pre-ulcerative corn or callous 12/06/2018  ? Pre-diabetes 03/13/2018  ? 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 CKD (chronic kidney disease) stage 3, GFR 30-59 ml/min (HCC) 08/07/2017  ? Essential hypertension 11/09/2016  ? Mixed hyperlipidemia 11/09/2016  ? Bipolar 2 disorder ([place]) 11/09/2016  ? Overactive bladder 11/09/2016  ? COPD  GOLD 0  11/09/2016  ? ?Rolfe Hartsell U PT, DPT, PN2  ? ?Supplemental Physical Therapist ?[facility]  ? ? ? ? ? ?[place] ?[address] ?[place], [place], [address] ?Phone: [phone]   Fax:  [phone] ? ?Name: SCOTTLYNN LINDELL ?MRN: [id number] ?Date of Birth: [birth year] ? ? ? ?

## 2021-08-18 ENCOUNTER — Ambulatory Visit: Payer: Medicare Other | Admitting: Physical Therapy

## 2021-08-18 ENCOUNTER — Other Ambulatory Visit: Payer: Self-pay

## 2021-08-18 DIAGNOSIS — R262 Difficulty in walking, not elsewhere classified: Secondary | ICD-10-CM | POA: Diagnosis not present

## 2021-08-18 DIAGNOSIS — R2681 Unsteadiness on feet: Secondary | ICD-10-CM | POA: Diagnosis not present

## 2021-08-18 DIAGNOSIS — R296 Repeated falls: Secondary | ICD-10-CM | POA: Diagnosis not present

## 2021-08-18 DIAGNOSIS — M6281 Muscle weakness (generalized): Secondary | ICD-10-CM | POA: Diagnosis not present

## 2021-08-18 NOTE — Therapy (Signed)
[place] ?[facility] ?[address] ?[place], [place], [address] ?Phone: [phone]   Fax:  [phone] ? ?Physical Therapy Treatment ? ?Patient Details  ?Name: Paula [name] ?MRN: [id number] ?Date of Birth: 06/16/1945 ?Referring Provider (PT): Corey ? ? ?Encounter Date: 08/18/2021 ? ? PT End of Session - 08/18/21 1430   ? ? Visit Number 13   ? Date for PT Re-Evaluation 08/31/21   ? Authorization Type [employer] and [employer]   ? Authorization Time Period 07/06/21 to 08/31/21   ? PT Start Time 1352   ? PT Stop Time 1430   ? PT Time Calculation (min) 38 min   ? ?  ?  ? ?  ? ? ?Past Medical History:  ?Diagnosis Date  ? Allergy   ? Arthritis   ? Asthma   ? Bipolar 1 disorder (HCC)   ? Colon polyps   ? Depression   ? Diabetes mellitus without complication (HCC)   ? GERD (gastroesophageal reflux disease)   ? History of alcohol abuse   ? History of chicken pox   ? History of drug abuse (HCC)   ? Hyperlipidemia   ? Hypertension   ? Urine incontinence   ? ? ?Past Surgical History:  ?Procedure Laterality Date  ? APPENDECTOMY    ? BREAST BIOPSY    ? CHOLECYSTECTOMY    ? LUNG BIOPSY    ? SUBTOTAL COLECTOMY    ? ? ?There were no vitals filed for this visit. ? ? Subjective Assessment - 08/18/21 1355   ? ? Subjective "pain better balance stinks"   ? Currently in Pain? No/denies   ? ?  ?  ? ?  ? ? ? ? ? ? ? ? ? ? ? ? ? ? ? ? ? ? ? ? [facility] Adult PT Treatment/Exercise - 08/18/21 0001   ? ?  ? High Level Balance  ? High Level Balance Activities Side stepping;Tandem walking;Negotitating around obstacles;Negotiating over obstacles   HHA on foam beam  ? High Level Balance Comments ball toss on airex   ?  ? Knee/Hip Exercises: Aerobic  ? Nustep L6 x5 minutes BLEs only   ?  ? Knee/Hip Exercises: Standing  ? Walking with Sports Cord 20# lateraly 5 x each over 1/2 roll   20# alt LE 4 in step tap 2 x CGA  ?  ? Knee/Hip Exercises: Seated  ? Sit to Sand 10 reps;without UE support   wt ball press on airex  ? ?   ?  ? ?  ? ? ? ? ? ? ? ? ? ? ? ? PT Short Term Goals - 08/09/21 1430   ? ?  ? PT SHORT TERM GOAL #1  ? Title Will be compliant with appropriate progressive HEP to be updated PRN   ? Status Achieved   ?  ? PT SHORT TERM GOAL #2  ? Title Gait pattern to improve to include more definitive heel strike/push off with toes as well as improved step/stride lengths   ? Status Achieved   ?  ? PT SHORT TERM GOAL #3  ? Title Will be able to name at least 3 ways to reduce fall risk at home and in community   ? Status Achieved   ?  ? PT SHORT TERM GOAL #4  ? Title Will report no more episodes of L knee buckling/feeling like it will collapse within the past 2 weeks   ? Status Achieved   ? ?  ?  ? ?  ? ? ? ?   PT Long Term Goals - 08/18/21 1429   ? ?  ? PT LONG TERM GOAL #1  ? Title MMT to have improved by at least 1 grade in all tested groups in order to improve strength and reduce fall risk   ? Status Partially Met   ?  ? PT LONG TERM GOAL #3  ? Title Will be able to ascend/descend steps with U rail and step over step pattern with no L knee pain or weakness in order to reduce fall risk/show improved community access   ? Status Achieved   ? ?  ?  ? ?  ? ? ? ? ? ? ? ? Plan - 08/18/21 1430   ? ? Clinical Impression Statement progressed high level balance acvtites adding in resisted lateral stepping over ob nad alt step tap for SLS moments CGA. ball toss and walking on foam mats was challenging but did showing good righting reactions. overall pt is showing improved balance but she still feels it is lacking and limiting   ? PT Treatment/Interventions ADLs/Self Care Home Management;Cryotherapy;Electrical Stimulation;Iontophoresis 78m/ml Dexamethasone;Moist Heat;Ultrasound;DME Instruction;Gait training;Stair training;Functional mobility training;Therapeutic activities;Therapeutic exercise;Balance training;Neuromuscular re-education;Patient/family education;Manual techniques;Energy conservation;Taping;Vasopneumatic Device   ? PT Next Visit  Plan balance, recheck BERG   ? ?  ?  ? ?  ? ? ?Patient will benefit from skilled therapeutic intervention in order to improve the following deficits and impairments:    ? ?Visit Diagnosis: ?Muscle weakness (generalized) ? ?Difficulty in walking, not elsewhere classified ? ?Unsteadiness on feet ? ? ? ? ?Problem List ?Patient Active Problem List  ? Diagnosis Date Noted  ? Chronic pain of left knee 08/08/2021  ? Primary osteoarthritis of left knee 06/21/2021  ? COVID-19 virus infection 06/08/2021  ? Mild mitral regurgitation 12/14/2020  ? Hypercalcemia 10/12/2020  ? Posterior neck pain 10/12/2020  ? Heart murmur 10/12/2020  ? Rash 12/11/2019  ? Pre-ulcerative corn or callous 12/06/2018  ? Pre-diabetes 03/13/2018  ? DIP (desquamative interstitial pneumonia) ([place] 08/31/2017  ? CKD (chronic kidney disease) stage 3, GFR 30-59 ml/min (HCC) 08/07/2017  ? Essential hypertension 11/09/2016  ? Mixed hyperlipidemia 11/09/2016  ? Bipolar 2 disorder ([place] 11/09/2016  ? Overactive bladder 11/09/2016  ? COPD  GOLD 0  11/09/2016  ? ? ?Graciano Batson,ANGIE, PTA ?08/18/2021, 2:33 PM ? ?[place] ?[place] ?[place] [address]?Phone: [phone]  Fax:  [phone]? ?Name: BVERSIA MIGNOGNA?MRN: [id number]?Date of Birth: 1October 01, 1947? ? ? ?

## 2021-08-22 ENCOUNTER — Ambulatory Visit: Payer: Medicare Other | Admitting: Physical Therapy

## 2021-08-22 ENCOUNTER — Encounter: Payer: Self-pay | Admitting: Physical Therapy

## 2021-08-22 ENCOUNTER — Other Ambulatory Visit: Payer: Self-pay

## 2021-08-22 DIAGNOSIS — R296 Repeated falls: Secondary | ICD-10-CM | POA: Diagnosis not present

## 2021-08-22 DIAGNOSIS — R2681 Unsteadiness on feet: Secondary | ICD-10-CM | POA: Diagnosis not present

## 2021-08-22 DIAGNOSIS — R262 Difficulty in walking, not elsewhere classified: Secondary | ICD-10-CM

## 2021-08-22 DIAGNOSIS — M6281 Muscle weakness (generalized): Secondary | ICD-10-CM | POA: Diagnosis not present

## 2021-08-22 NOTE — Therapy (Signed)
[place] ?[place] ?[address] ?[place], [place], [address] ?Phone: [phone]   Fax:  [phone] ? ?Physical Therapy Treatment ? ?Patient Details  ?Name: Paula [name] ?MRN: [id number] ?Date of Birth: 09-Aug-1945 ?Referring Provider (PT): Georgina Snell ? ? ?Encounter Date: 08/22/2021 ? ? PT End of Session - 08/22/21 1534   ? ? Visit Number 14   ? Number of Visits 17   ? Date for PT Re-Evaluation 08/31/21   ? Authorization Type [employer] and [employer]   ? Authorization Time Period 07/06/21 to 08/31/21   ? Progress Note Due on Visit 20   ? PT Start Time 8563   ? PT Stop Time 1497   ? PT Time Calculation (min) 40 min   ? Activity Tolerance Patient tolerated treatment well   ? Behavior During Therapy [facility] for tasks assessed/performed   ? ?  ?  ? ?  ? ? ?Past Medical History:  ?Diagnosis Date  ? Allergy   ? Arthritis   ? Asthma   ? Bipolar 1 disorder ([place])   ? Colon polyps   ? Depression   ? Diabetes mellitus without complication ([place])   ? GERD (gastroesophageal reflux disease)   ? History of alcohol abuse   ? History of chicken pox   ? History of drug abuse ([place])   ? Hyperlipidemia   ? Hypertension   ? Urine incontinence   ? ? ?Past Surgical History:  ?Procedure Laterality Date  ? APPENDECTOMY    ? BREAST BIOPSY    ? CHOLECYSTECTOMY    ? LUNG BIOPSY    ? SUBTOTAL COLECTOMY    ? ? ?There were no vitals filed for this visit. ? ? Subjective Assessment - 08/22/21 1448   ? ? Subjective I'm doing good, no pain today. My biggest concerns is balance still. Sometimes I fall out of bed, my legs go first and the rest of my body follows.   ? Patient Stated Goals build strength in knee, no more falls   ? Currently in Pain? No/denies   ? ?  ?  ? ?  ? ? ? ? ? ? ? ? ? ? ? ? ? ? ? ? ? ? ? ? [facility] Adult PT Treatment/Exercise - 08/22/21 0001   ? ?  ? Knee/Hip Exercises: Aerobic  ? Nustep L6 x5 minutes BLEs only   ?  ? Knee/Hip Exercises: Standing  ? Other Standing Knee Exercises BOSU step ups 1x10 B  forward and lateral   ? ?  ?  ? ?  ? ? ? ? ? ? Balance Exercises - 08/22/21 0001   ? ?  ? Balance Exercises: Standing  ? Standing Eyes Closed Narrow base of support (BOS);3 reps;30 secs;Foam/compliant surface   ? SLS Eyes open;3 reps;15 secs   ? Marching Foam/compliant surface;Static;20 reps   high knee marches, slow and controlled  ? Other Standing Exercises sideways foot taps x20 off blue foam pad; forward toe taps off of blue foam pad 1x20; tandem stance with horizontal head turns x3 rounds each side   ? ?  ?  ? ?  ? ? ? ? ? PT Education - 08/22/21 1534   ? ? Education Details potential POC moving forward   ? Person(s) Educated Patient   ? Methods Explanation   ? Comprehension Verbalized understanding   ? ?  ?  ? ?  ? ? ? PT Short Term Goals - 08/09/21 1430   ? ?  ?  PT SHORT TERM GOAL #1  ? Title Will be compliant with appropriate progressive HEP to be updated PRN   ? Status Achieved   ?  ? PT SHORT TERM GOAL #2  ? Title Gait pattern to improve to include more definitive heel strike/push off with toes as well as improved step/stride lengths   ? Status Achieved   ?  ? PT SHORT TERM GOAL #3  ? Title Will be able to name at least 3 ways to reduce fall risk at home and in community   ? Status Achieved   ?  ? PT SHORT TERM GOAL #4  ? Title Will report no more episodes of L knee buckling/feeling like it will collapse within the past 2 weeks   ? Status Achieved   ? ?  ?  ? ?  ? ? ? ? PT Long Term Goals - 08/18/21 1429   ? ?  ? PT LONG TERM GOAL #1  ? Title MMT to have improved by at least 1 grade in all tested groups in order to improve strength and reduce fall risk   ? Status Partially Met   ?  ? PT LONG TERM GOAL #3  ? Title Will be able to ascend/descend steps with U rail and step over step pattern with no L knee pain or weakness in order to reduce fall risk/show improved community access   ? Status Achieved   ? ?  ?  ? ?  ? ? ? ? ? ? ? ? Plan - 08/22/21 1534   ? ? Clinical Impression Statement Paula [name] arrives today  doing OK, still quite worried about her balance. We continued focusing on balance this morning per her request. 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 Her end of POC is coming up on the thirtieth, we will reassess at that point and work together to determine if needs an extension of therapy at that point.   ? Personal Factors and Comorbidities Age;Fitness;Past/Current Experience;Time since onset of injury/illness/exacerbation;Comorbidity 2   ? Comorbidities hx covid, tardive dyskinesia (only effects jaw but meds have impact on her gait- shuffles more)   ? Examination-Activity Limitations Locomotion Level;Transfers;Bed Mobility;Squat;Stairs;Stand;Lift;Carry   ? Examination-Participation Restrictions Church;Cleaning;Community Activity;Driving;Shop;Laundry;[employer] Work   ? Stability/Clinical Decision Making Evolving/Moderate complexity   ? Clinical Decision Making Moderate   ? Rehab Potential Good   ? PT Frequency 2x / week   ? PT Duration 8 weeks   ? PT Treatment/Interventions ADLs/Self Care Home Management;Cryotherapy;Electrical Stimulation;Iontophoresis 58m/ml Dexamethasone;Moist Heat;Ultrasound;DME Instruction;Gait training;Stair training;Functional mobility training;Therapeutic activities;Therapeutic exercise;Balance training;Neuromuscular re-education;Patient/family education;Manual techniques;Energy conservation;Taping;Vasopneumatic Device   ? PT Next Visit Plan balance focus   ? PT Home Exercise Plan [id number]+ sitting step overs   ? Consulted and Agree with Plan of Care Patient   ? ?  ?  ? ?  ? ? ?Patient will benefit from skilled therapeutic intervention in order to improve the following deficits and impairments:  Abnormal gait, Difficulty walking, Cardiopulmonary status limiting activity, Obesity, Decreased activity tolerance, Decreased balance, Decreased knowledge of use of DME, Decreased mobility, Decreased strength ? ?Visit Diagnosis: ?Muscle  weakness (generalized) ? ?Difficulty in walking, not elsewhere classified ? ?Unsteadiness on feet ? ?Repeated falls ? ? ? ? ?Problem List ?Patient Active Problem List  ? Diagnosis Date Noted  ? Chronic pain of left knee 08/08/2021  ? Primary osteoarthritis of left knee 06/21/2021  ? COVID-19 virus infection 06/08/2021  ?  Mild mitral regurgitation 12/14/2020  ? Hypercalcemia 10/12/2020  ? Posterior neck pain 10/12/2020  ? Heart murmur 10/12/2020  ? Rash 12/11/2019  ? Pre-ulcerative corn or callous 12/06/2018  ? Pre-diabetes 03/13/2018  ? DIP (desquamative interstitial pneumonia) ([place]) 08/31/2017  ? CKD (chronic kidney disease) stage 3, GFR 30-59 ml/min (HCC) 08/07/2017  ? Essential hypertension 11/09/2016  ? Mixed hyperlipidemia 11/09/2016  ? Bipolar 2 disorder ([place]) 11/09/2016  ? Overactive bladder 11/09/2016  ? COPD  GOLD 0  11/09/2016  ? ?Mairim Bade U PT, DPT, PN2  ? ?Supplemental Physical Therapist ?[facility]  ? ? ? ? ? ?[place] ?[address] ?[place], [place], [address] ?Phone: [phone]   Fax:  [phone] ? ?Name: Paula [name] ?MRN: [id number] ?Date of Birth: December 16, 1945 ? ? ? ?

## 2021-08-25 ENCOUNTER — Ambulatory Visit: Payer: Medicare Other | Admitting: Physical Therapy

## 2021-08-25 ENCOUNTER — Encounter: Payer: Self-pay | Admitting: Physical Therapy

## 2021-08-25 ENCOUNTER — Other Ambulatory Visit: Payer: Self-pay

## 2021-08-25 DIAGNOSIS — R262 Difficulty in walking, not elsewhere classified: Secondary | ICD-10-CM | POA: Diagnosis not present

## 2021-08-25 DIAGNOSIS — R2681 Unsteadiness on feet: Secondary | ICD-10-CM | POA: Diagnosis not present

## 2021-08-25 DIAGNOSIS — R296 Repeated falls: Secondary | ICD-10-CM

## 2021-08-25 DIAGNOSIS — M6281 Muscle weakness (generalized): Secondary | ICD-10-CM

## 2021-08-25 NOTE — Therapy (Signed)
[facility] ?[place] ?[address] ?[place], [place], [address] ?Phone: [phone]   Fax:  [phone] ? ?Physical Therapy Treatment ? ?Patient Details  ?Name: Paula [name] ?MRN: [id number] ?Date of Birth: [birth year] ?Referring Provider (PT): Georgina Snell ? ? ?Encounter Date: 08/25/2021 ? ? PT End of Session - 08/25/21 1444   ? ? Visit Number 15   ? Number of Visits 17   ? Date for PT Re-Evaluation 08/31/21   ? Authorization Type [employer] and [employer]ARP   ? Authorization Time Period 07/06/21 to 08/31/21   ? Progress Note Due on Visit 20   ? PT Start Time 1402   ? PT Stop Time 0630   ? PT Time Calculation (min) 40 min   ? Activity Tolerance Patient tolerated treatment well   ? Behavior During Therapy [facility] for tasks assessed/performed   ? ?  ?  ? ?  ? ? ?Past Medical History:  ?Diagnosis Date  ? Allergy   ? Arthritis   ? Asthma   ? Bipolar 1 disorder ([place])   ? Colon polyps   ? Depression   ? Diabetes mellitus without complication ([place])   ? GERD (gastroesophageal reflux disease)   ? History of alcohol abuse   ? History of chicken pox   ? History of drug abuse ([place])   ? Hyperlipidemia   ? Hypertension   ? Urine incontinence   ? ? ?Past Surgical History:  ?Procedure Laterality Date  ? APPENDECTOMY    ? BREAST BIOPSY    ? CHOLECYSTECTOMY    ? LUNG BIOPSY    ? SUBTOTAL COLECTOMY    ? ? ?There were no vitals filed for this visit. ? ? Subjective Assessment - 08/25/21 1403   ? ? Subjective I had some increased knee pain after last time, not sure what caused it   ? Patient Stated Goals build strength in knee, no more falls   ? Currently in Pain? Yes   ? Pain Score 1    ? Pain Location Knee   ? Pain Orientation Left;Medial   ? Pain Descriptors / Indicators Dull   ? Pain Type Acute pain   ? ?  ?  ? ?  ? ? ? ? ? ? ? ? ? ? ? ? ? ? ? ? ? ? ? ? [facility] Adult PT Treatment/Exercise - 08/25/21 0001   ? ?  ? Ambulation/Gait  ? Gait Comments gait training outside through thick grass and uneven surfaces with  min guard/occasional cues to slow down   ?  ? Knee/Hip Exercises: Aerobic  ? Nustep L5x6 minutes B   ? ?  ?  ? ?  ? ? ? ? ? ? Balance Exercises - 08/25/21 0001   ? ?  ? Balance Exercises: Standing  ? Standing Eyes Closed Narrow base of support (BOS);3 reps;30 secs;Foam/compliant surface   ? Rockerboard Anterior/posterior;30 seconds   UUE support  ? Other Standing Exercises forward/backwards/lateral stepping off blue foam pad 1x10 B; forward and lateral stepping over obstacles over various heights progressing from 2 hands to no hands   ? ?  ?  ? ?  ? ? ? ? ? PT Education - 08/25/21 1443   ? ? Education Details exercise form/purpose   ? Person(s) Educated Patient   ? Methods Explanation   ? Comprehension Verbalized understanding   ? ?  ?  ? ?  ? ? ? PT Short Term Goals - 08/09/21 1430   ? ?  ?  PT SHORT TERM GOAL #1  ? Title Will be compliant with appropriate progressive HEP to be updated PRN   ? Status Achieved   ?  ? PT SHORT TERM GOAL #2  ? Title Gait pattern to improve to include more definitive heel strike/push off with toes as well as improved step/stride lengths   ? Status Achieved   ?  ? PT SHORT TERM GOAL #3  ? Title Will be able to name at least 3 ways to reduce fall risk at home and in community   ? Status Achieved   ?  ? PT SHORT TERM GOAL #4  ? Title Will report no more episodes of L knee buckling/feeling like it will collapse within the past 2 weeks   ? Status Achieved   ? ?  ?  ? ?  ? ? ? ? PT Long Term Goals - 08/18/21 1429   ? ?  ? PT LONG TERM GOAL #1  ? Title MMT to have improved by at least 1 grade in all tested groups in order to improve strength and reduce fall risk   ? Status Partially Met   ?  ? PT LONG TERM GOAL #3  ? Title Will be able to ascend/descend steps with U rail and step over step pattern with no L knee pain or weakness in order to reduce fall risk/show improved community access   ? Status Achieved   ? ?  ?  ? ?  ? ? ? ? ? ? ? ? Plan - 08/25/21 1444   ? ? Clinical Impression Statement  Paula [name] arrives doing OK today, having a little bit of increased pain in her L knee after last time- not sure what caused this but it is getting better. Focused mostly on balance training and foot clearance again today, also I gave her a sample of biofreeze to try on her knee pain as well. I do think she will be ready for DC at the next couple of appoints.   ? Personal Factors and Comorbidities Age;Fitness;Past/Current Experience;Time since onset of injury/illness/exacerbation;Comorbidity 2   ? Comorbidities hx covid, tardive dyskinesia (only effects jaw but meds have impact on her gait- shuffles more)   ? Examination-Activity Limitations Locomotion Level;Transfers;Bed Mobility;Squat;Stairs;Stand;Lift;Carry   ? Examination-Participation Restrictions Church;Cleaning;Community Activity;Driving;Shop;Laundry;[employer] Work   ? Stability/Clinical Decision Making Evolving/Moderate complexity   ? Clinical Decision Making Moderate   ? Rehab Potential Good   ? PT Frequency 2x / week   ? PT Duration 8 weeks   ? PT Treatment/Interventions ADLs/Self Care Home Management;Cryotherapy;Electrical Stimulation;Iontophoresis 34m/ml Dexamethasone;Moist Heat;Ultrasound;DME Instruction;Gait training;Stair training;Functional mobility training;Therapeutic activities;Therapeutic exercise;Balance training;Neuromuscular re-education;Patient/family education;Manual techniques;Energy conservation;Taping;Vasopneumatic Device   ? PT Next Visit Plan balance focus   ? PT Home Exercise Plan [id number]+ sitting step overs   ? Consulted and Agree with Plan of Care Patient   ? ?  ?  ? ?  ? ? ?Patient will benefit from skilled therapeutic intervention in order to improve the following deficits and impairments:  Abnormal gait, Difficulty walking, Cardiopulmonary status limiting activity, Obesity, Decreased activity tolerance, Decreased balance, Decreased knowledge of use of DME, Decreased mobility, Decreased strength ? ?Visit Diagnosis: ?Muscle weakness  (generalized) ? ?Difficulty in walking, not elsewhere classified ? ?Unsteadiness on feet ? ?Repeated falls ? ? ? ? ?Problem List ?Patient Active Problem List  ? Diagnosis Date Noted  ? Chronic pain of left knee 08/08/2021  ? Primary osteoarthritis of left knee 06/21/2021  ? COVID-19 virus infection 06/08/2021  ?  Mild mitral regurgitation 12/14/2020  ? Hypercalcemia 10/12/2020  ? Posterior neck pain 10/12/2020  ? Heart murmur 10/12/2020  ? Rash 12/11/2019  ? Pre-ulcerative corn or callous 12/06/2018  ? Pre-diabetes 03/13/2018  ? DIP (desquamative interstitial pneumonia) ([place]) 08/31/2017  ? CKD (chronic kidney disease) stage 3, GFR 30-59 ml/min (HCC) 08/07/2017  ? Essential hypertension 11/09/2016  ? Mixed hyperlipidemia 11/09/2016  ? Bipolar 2 disorder ([place]) 11/09/2016  ? Overactive bladder 11/09/2016  ? COPD  GOLD 0  11/09/2016  ? ?Caro Brundidge U PT, DPT, PN2  ? ?Supplemental Physical Therapist ?[facility]  ? ? ? ? ? ?[place] ?[address] ?[place], [place], [address] ?Phone: [phone]   Fax:  [phone] ? ?Name: Paula [name] ?MRN: [id number] ?Date of Birth: Nov 03, 1945 ? ? ? ?

## 2021-08-30 ENCOUNTER — Other Ambulatory Visit: Payer: Self-pay

## 2021-08-30 ENCOUNTER — Encounter: Payer: Self-pay | Admitting: Physical Therapy

## 2021-08-30 ENCOUNTER — Ambulatory Visit: Payer: Medicare Other | Admitting: Physical Therapy

## 2021-08-30 DIAGNOSIS — R2681 Unsteadiness on feet: Secondary | ICD-10-CM | POA: Diagnosis not present

## 2021-08-30 DIAGNOSIS — R262 Difficulty in walking, not elsewhere classified: Secondary | ICD-10-CM

## 2021-08-30 DIAGNOSIS — R296 Repeated falls: Secondary | ICD-10-CM

## 2021-08-30 DIAGNOSIS — M6281 Muscle weakness (generalized): Secondary | ICD-10-CM | POA: Diagnosis not present

## 2021-08-30 NOTE — Therapy (Signed)
[facility] ?[place] [address] ?[place], [place], [address] ?Phone: [phone]   Fax:  [phone] ? ?Physical Therapy Treatment ? ?Patient Details  ?Name: Paula [name] ?MRN: [id number] ?Date of Birth: Jan 11, 1946 ?Referring Provider (PT): Georgina Snell ? ? ?Encounter Date: 08/30/2021 ? ? PT End of Session - 08/30/21 1529   ? ? Visit Number 16   ? Number of Visits 17   ? Date for PT Re-Evaluation 08/31/21   ? Authorization Type [employer] and [employer]   ? Authorization Time Period 07/06/21 to 08/31/21   ? Progress Note Due on Visit 20   ? PT Start Time 4008   ? PT Stop Time 1526   ? PT Time Calculation (min) 40 min   ? Activity Tolerance Patient tolerated treatment well   ? Behavior During Therapy [facility] for tasks assessed/performed   ? ?  ?  ? ?  ? ? ?Past Medical History:  ?Diagnosis Date  ? Allergy   ? Arthritis   ? Asthma   ? Bipolar 1 disorder ([place])   ? Colon polyps   ? Depression   ? Diabetes mellitus without complication ([place])   ? GERD (gastroesophageal reflux disease)   ? History of alcohol abuse   ? History of chicken pox   ? History of drug abuse ([place])   ? Hyperlipidemia   ? Hypertension   ? Urine incontinence   ? ? ?Past Surgical History:  ?Procedure Laterality Date  ? APPENDECTOMY    ? BREAST BIOPSY    ? CHOLECYSTECTOMY    ? LUNG BIOPSY    ? SUBTOTAL COLECTOMY    ? ? ?There were no vitals filed for this visit. ? ? Subjective Assessment - 08/30/21 1447   ? ? Subjective I felt OK after last session, I did do my son's steps this weekend, it just felt like it was going to go sideways and I don't trust it 100%. No other episodes with steps or stairs since that one incident. I like the biofreeze.   ? Patient Stated Goals build strength in knee, no more falls   ? Currently in Pain? No/denies   ? ?  ?  ? ?  ? ? ? ? ? ? ? ? ? ? ? ? ? ? ? ? ? ? ? ? [place] Adult PT Treatment/Exercise - 08/30/21 0001   ? ?  ? Therapeutic Activites   ? Therapeutic Activities Other Therapeutic Activities    practiced bed mobility in and out as she reports losing her balance getting in and OOB  ? ?  ?  ? ?  ? ? ? ? ? ? Balance Exercises - 08/30/21 0001   ? ?  ? Balance Exercises: Standing  ? Tandem Gait 4 reps;Forward;Foam/compliant surface   in // bars  ? Other Standing Exercises stepping over cones on foam pad and solid surface with minimal BUE support forward and lateral   ? Other Standing Exercises Comments 8 inch step taps while standing on blue foam pad 1x20; horizontal and vertical head turns with one foot on 8 inch step and other on blue foam pad 4x10 B   ? ?  ?  ? ?  ? ? ? ? ? PT Education - 08/30/21 1528   ? ? Education Details re-assesss and possible DC next session, bed mobility mechanics and safety, encouraged use of rails in bed to prevent falling out, importance of continuing with activity to prevent loss of gains in  PT   ? Person(s) Educated Patient   ? Methods Explanation   ? Comprehension Verbalized understanding   ? ?  ?  ? ?  ? ? ? PT Short Term Goals - 08/09/21 1430   ? ?  ? PT SHORT TERM GOAL #1  ? Title Will be compliant with appropriate progressive HEP to be updated PRN   ? Status Achieved   ?  ? PT SHORT TERM GOAL #2  ? Title Gait pattern to improve to include more definitive heel strike/push off with toes as well as improved step/stride lengths   ? Status Achieved   ?  ? PT SHORT TERM GOAL #3  ? Title Will be able to name at least 3 ways to reduce fall risk at home and in community   ? Status Achieved   ?  ? PT SHORT TERM GOAL #4  ? Title Will report no more episodes of L knee buckling/feeling like it will collapse within the past 2 weeks   ? Status Achieved   ? ?  ?  ? ?  ? ? ? ? PT Long Term Goals - 08/18/21 1429   ? ?  ? PT LONG TERM GOAL #1  ? Title MMT to have improved by at least 1 grade in all tested groups in order to improve strength and reduce fall risk   ? Status Partially Met   ?  ? PT LONG TERM GOAL #3  ? Title Will be able to ascend/descend steps with U rail and step over step  pattern with no L knee pain or weakness in order to reduce fall risk/show improved community access   ? Status Achieved   ? ?  ?  ? ?  ? ? ? ? ? ? ? ? Plan - 08/30/21 1529   ? ? Clinical Impression Statement Paula [name] arrives today doing OK, she had some issues with knee weakness over the weekend when dealing with her son?s steps. We continued working on the [employer] and focused on balance for the rest of the session per her request. Will need to reassess next session, will potentially be ready for DC.   ? Personal Factors and Comorbidities Age;Fitness;Past/Current Experience;Time since onset of injury/illness/exacerbation;Comorbidity 2   ? Comorbidities hx covid, tardive dyskinesia (only effects jaw but meds have impact on her gait- shuffles more)   ? Examination-Activity Limitations Locomotion Level;Transfers;Bed Mobility;Squat;Stairs;Stand;Lift;Carry   ? Examination-Participation Restrictions Church;Cleaning;Community Activity;Driving;Shop;Laundry;[employer] Work   ? Stability/Clinical Decision Making Evolving/Moderate complexity   ? Clinical Decision Making Moderate   ? Rehab Potential Good   ? PT Frequency 2x / week   ? PT Duration 8 weeks   ? PT Treatment/Interventions ADLs/Self Care Home Management;Cryotherapy;Electrical Stimulation;Iontophoresis 30m/ml Dexamethasone;Moist Heat;Ultrasound;DME Instruction;Gait training;Stair training;Functional mobility training;Therapeutic activities;Therapeutic exercise;Balance training;Neuromuscular re-education;Patient/family education;Manual techniques;Energy conservation;Taping;Vasopneumatic Device   ? PT Next Visit Plan reassess/possible DC   ? PT Home Exercise Plan [id number]+ sitting step overs   ? Consulted and Agree with Plan of Care Patient   ? ?  ?  ? ?  ? ? ?Patient will benefit from skilled therapeutic intervention in order to improve the following deficits and impairments:  Abnormal gait, Difficulty walking, Cardiopulmonary status limiting activity, Obesity, Decreased  activity tolerance, Decreased balance, Decreased knowledge of use of DME, Decreased mobility, Decreased strength ? ?Visit Diagnosis: ?Muscle weakness (generalized) ? ?Difficulty in walking, not elsewhere classified ? ?Unsteadiness on feet ? ?Repeated falls ? ? ? ? ?Problem List ?Patient Active Problem List  ?  Diagnosis Date Noted  ? Chronic pain of left knee 08/08/2021  ? Primary osteoarthritis of left knee 06/21/2021  ? COVID-19 virus infection 06/08/2021  ? Mild mitral regurgitation 12/14/2020  ? Hypercalcemia 10/12/2020  ? Posterior neck pain 10/12/2020  ? Heart murmur 10/12/2020  ? Rash 12/11/2019  ? Pre-ulcerative corn or callous 12/06/2018  ? Pre-diabetes 03/13/2018  ? 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 CKD (chronic kidney disease) stage 3, GFR 30-59 ml/min (HCC) 08/07/2017  ? Essential hypertension 11/09/2016  ? Mixed hyperlipidemia 11/09/2016  ? Bipolar 2 disorder ([place]) 11/09/2016  ? Overactive bladder 11/09/2016  ? COPD  GOLD 0  11/09/2016  ? ?Cay Kath U PT, DPT, PN2  ? ?Supplemental Physical Therapist ?[place]  ? ? ? ? ? ?[place] [address] ?[address] ?[place], [place], [address] ?Phone: [phone]   Fax:  [phone] ? ?Name: Paula [name] ?MRN: [id number] ?Date of Birth: [birth year] ? ? ? ?

## 2021-09-01 ENCOUNTER — Encounter: Payer: Self-pay | Admitting: Physical Therapy

## 2021-09-01 ENCOUNTER — Ambulatory Visit: Payer: Medicare Other | Admitting: Physical Therapy

## 2021-09-01 DIAGNOSIS — R2681 Unsteadiness on feet: Secondary | ICD-10-CM

## 2021-09-01 DIAGNOSIS — R296 Repeated falls: Secondary | ICD-10-CM | POA: Diagnosis not present

## 2021-09-01 DIAGNOSIS — R262 Difficulty in walking, not elsewhere classified: Secondary | ICD-10-CM | POA: Diagnosis not present

## 2021-09-01 DIAGNOSIS — M6281 Muscle weakness (generalized): Secondary | ICD-10-CM

## 2021-09-01 NOTE — Therapy (Signed)
[facility] [place] ?[place] [address] ?[place], [place], [address] ?Phone: [phone]   Fax:  [phone] ? ?Physical Therapy Treatment ? ?Patient Details  ?Name: Paula [name] ?MRN: [id number] ?Date of Birth: 1946/01/09 ?Referring Provider (PT): Georgina Snell ? ? ?Encounter Date: 09/01/2021 ? ?PHYSICAL THERAPY DISCHARGE SUMMARY ? ?Visits from Start of Care: 17 ? ?Current functional level related to goals / functional outcomes: ?See below  ?  ?Remaining deficits: ?See below  ?  ?Education / Equipment: ?See below   ? ?Patient agrees to discharge. Patient goals were partially met. Patient is being discharged due to being pleased with the current functional level. ? ? ? PT End of Session - 09/01/21 1621   ? ? Visit Number 17   ? Number of Visits 17   ? Authorization Type [employer] and [employer]   ? Authorization Time Period 07/06/21 to 08/31/21   ? Progress Note Due on Visit 20   ? PT Start Time 3335   ? PT Stop Time 4562   ? PT Time Calculation (min) 42 min   ? Activity Tolerance Patient tolerated treatment well   ? Behavior During Therapy [facility] for tasks assessed/performed   ? ?  ?  ? ?  ? ? ?Past Medical History:  ?Diagnosis Date  ? Allergy   ? Arthritis   ? Asthma   ? Bipolar 1 disorder ([place])   ? Colon polyps   ? Depression   ? Diabetes mellitus without complication ([place])   ? GERD (gastroesophageal reflux disease)   ? History of alcohol abuse   ? History of chicken pox   ? History of drug abuse ([place])   ? Hyperlipidemia   ? Hypertension   ? Urine incontinence   ? ? ?Past Surgical History:  ?Procedure Laterality Date  ? APPENDECTOMY    ? BREAST BIOPSY    ? CHOLECYSTECTOMY    ? LUNG BIOPSY    ? SUBTOTAL COLECTOMY    ? ? ?There were no vitals filed for this visit. ? ? Subjective Assessment - 09/01/21 1533   ? ? Subjective This week has felt OK, the only problem I have is if I go up with the left leg on steps the knee screams, if I go up with my right I'm fine. Its still hard for me to balance,  I have varying responses to trying to balance.   ? Patient Stated Goals build strength in knee, no more falls   ? Currently in Pain? No/denies   ? ?  ?  ? ?  ? ? ? ? ? [facility] PT Assessment - 09/01/21 0001   ? ?  ? Strength  ? Right Hip Flexion 3+/5   ? Right Hip Extension 3/5   ? Right Hip ABduction 3/5   ? Left Hip Flexion 3/5   ? Left Hip Extension 3-/5   ? Left Hip ABduction 4+/5   ? Right Knee Flexion 4-/5   ? Right Knee Extension 4+/5   ? Left Knee Flexion 4+/5   ? Left Knee Extension 4+/5   ? Right Ankle Dorsiflexion 5/5   ? Left Ankle Dorsiflexion 5/5   ?  ? Transfers  ? Five time sit to stand comments  9.7 sec   ?  ? Berg Balance Test  ? Sit to Stand Able to stand without using hands and stabilize independently   ? Standing Unsupported Able to stand safely 2 minutes   ? Sitting with Back Unsupported but  Feet Supported on Floor or Stool Able to sit safely and securely 2 minutes   ? Stand to Sit Sits safely with minimal use of hands   ? Transfers Able to transfer safely, minor use of hands   ? Standing Unsupported with Eyes Closed Able to stand 10 seconds safely   ? Standing Unsupported with Feet Together Able to place feet together independently and stand 1 minute safely   ? From Standing, Reach Forward with Outstretched Arm Can reach forward >12 cm safely (5")   ? From Standing Position, Pick up Object from [place] to pick up shoe safely and easily   ? From Standing Position, Turn to Look Behind Over each Shoulder Looks behind from both sides and weight shifts well   ? Turn 360 Degrees Able to turn 360 degrees safely in 4 seconds or less   ? Standing Unsupported, Alternately Place Feet on Step/Stool Able to stand independently and safely and complete 8 steps in 20 seconds   ? Standing Unsupported, One Foot in Front Needs help to step but can hold 15 seconds   ? Standing on One Leg Able to lift leg independently and hold equal to or more than 3 seconds   ? Total Score 50   ? ?  ?  ? ?   ? ? ? ? ? ? ? ? ? ? ? ? ? ? ? ? [place] Adult PT Treatment/Exercise - 09/01/21 0001   ? ?  ? Knee/Hip Exercises: Aerobic  ? Nustep L5 x6 minutes   ? ?  ?  ? ?  ? ? ? ? ? ? Balance Exercises - 09/01/21 0001   ? ?  ? Balance Exercises: Standing  ? Other Standing Exercises standing on blue foam pad with table behind her, ball tosses at different angles with STS between each toss   15x2  ? ?  ?  ? ?  ? ? ? ? ? PT Education - 09/01/21 1620   ? ? Education Details reassess findings, DC today, HEP updates, multiple online resources via silver sneakers in multiple venues to help keep her moving   ? Person(s) Educated Patient   ? Methods Explanation;Demonstration;Handout   ? Comprehension Verbalized understanding;Returned demonstration   ? ?  ?  ? ?  ? ? ? PT Short Term Goals - 09/01/21 1548   ? ?  ? PT SHORT TERM GOAL #1  ? Title Will be compliant with appropriate progressive HEP to be updated PRN   ? Baseline 3/30- doing every other day consistently   ? Time 4   ? Period Weeks   ? Status Achieved   ?  ? PT SHORT TERM GOAL #2  ? Title Gait pattern to improve to include more definitive heel strike/push off with toes as well as improved step/stride lengths   ? Baseline 3/30- still tends to drag feet a bit, heel strike and toe off still mixed/muddy   ? Time 4   ? Period Weeks   ? Status On-going   ?  ? PT SHORT TERM GOAL #3  ? Title Will be able to name at least 3 ways to reduce fall risk at home and in community   ? Time 4   ? Period Weeks   ? Status Achieved   ?  ? PT SHORT TERM GOAL #4  ? Title Will report no more episodes of L knee buckling/feeling like it will collapse within the past 2 weeks   ?  Baseline 3/30- chronic buckling when trying to do steps   ? Time 4   ? Period Weeks   ? Status Partially Met   ? ?  ?  ? ?  ? ? ? ? PT Long Term Goals - 09/01/21 1551   ? ?  ? PT LONG TERM GOAL #1  ? Title MMT to have improved by at least 1 grade in all tested groups in order to improve strength and reduce fall risk   ? Time 8   ?  Period Weeks   ? Status Partially Met   ?  ? PT LONG TERM GOAL #2  ? Title Berg score to improve to at least 50 to show reduced fall risk   ? Baseline 3/30- 50   ? Time 8   ? Period Weeks   ? Status Achieved   ?  ? PT LONG TERM GOAL #3  ? Title Will be able to ascend/descend steps with U rail and step over step pattern with no L knee pain or weakness in order to reduce fall risk/show improved community access   ? Baseline 3/30- limited by knee pain but steps are much easier in general   ? Time 8   ? Period Weeks   ? Status Achieved   ?  ? PT LONG TERM GOAL #4  ? Title Will be compliant with gym based HEP versus advanced home HEP to maintain functional gains from therapy and prevent recurrence of problem   ? Baseline 3/30- discussed   ? Time 8   ? Period Weeks   ? Status On-going   ? ?  ?  ? ?  ? ? ? ? ? ? ? ? Plan - 09/01/21 1621   ? ? Clinical Impression Statement Paula [name] arrives today doing OK, still having some issues with L knee pain with steps but otherwise doing well and would like to make today her last day. She does show objective progress towards goals, we had a long discussion about different methods available to her including [employer], molecular through their website and Youtube, [employer] and group exercise classes, and advanced HEP. Really encouraged ongoing activity to maintain functional gains from PT. DC for now- will need new MD referral to return to skilled PT services.   ? Personal Factors and Comorbidities Age;Fitness;Past/Current Experience;Time since onset of injury/illness/exacerbation;Comorbidity 2   ? Comorbidities hx covid, tardive dyskinesia (only effects jaw but meds have impact on her gait- shuffles more)   ? Examination-Activity Limitations Locomotion Level;Transfers;Bed Mobility;Squat;Stairs;Stand;Lift;Carry   ? Examination-Participation Restrictions Church;Cleaning;Community Activity;Driving;Shop;Laundry;[employer] Work   ? Stability/Clinical Decision Making Evolving/Moderate  complexity   ? Clinical Decision Making Moderate   ? Rehab Potential Good   ? PT Frequency 2x / week   ? PT Duration 8 weeks   ? PT Treatment/Interventions ADLs/Self Care Home Management;Cryotherapy;Electrical Stimulation;

## 2021-09-13 ENCOUNTER — Other Ambulatory Visit: Payer: Self-pay | Admitting: Internal Medicine

## 2021-09-30 DIAGNOSIS — Z20822 Contact with and (suspected) exposure to covid-19: Secondary | ICD-10-CM | POA: Diagnosis not present

## 2021-10-05 DIAGNOSIS — Z20822 Contact with and (suspected) exposure to covid-19: Secondary | ICD-10-CM | POA: Diagnosis not present

## 2021-10-10 ENCOUNTER — Telehealth: Payer: Self-pay | Admitting: Internal Medicine

## 2021-10-10 MED ORDER — FREESTYLE LITE TEST VI STRP: ORAL_STRIP | 3 refills | Status: DC

## 2021-10-10 NOTE — Telephone Encounter (Signed)
Refill sent to pharmacy.   

## 2021-10-10 NOTE — Telephone Encounter (Signed)
1.Medication Requested: ?FREESTYLE LITE test strip ?2. Pharmacy (Name, Street, [place]): [id number] [address] [place]  [phone] ?Fax:  [phone] ?  ? ?3. On Med List: yes ? ?4. Last Visit with PCP: ? ?5. Next visit date with PCP: ? ? ?Agent: Please be advised that RX refills may take up to 3 business days. We ask that you follow-up with your pharmacy.  ?

## 2021-11-08 ENCOUNTER — Ambulatory Visit: Payer: Self-pay

## 2021-11-08 ENCOUNTER — Ambulatory Visit (INDEPENDENT_AMBULATORY_CARE_PROVIDER_SITE_OTHER): Payer: Medicare Other | Admitting: Family Medicine

## 2021-11-08 VITALS — BP 124/76 | HR 71 | Ht 64.0 in | Wt 181.4 lb

## 2021-11-08 DIAGNOSIS — M1712 Unilateral primary osteoarthritis, left knee: Secondary | ICD-10-CM

## 2021-11-08 DIAGNOSIS — G8929 Other chronic pain: Secondary | ICD-10-CM

## 2021-11-08 DIAGNOSIS — M25562 Pain in left knee: Secondary | ICD-10-CM

## 2021-11-08 NOTE — Patient Instructions (Signed)
Thank you for coming in today.   You received a steroid injection in your left knee today. Seek immediate medical attention if the joint becomes red, extremely painful, or is oozing fluid.   Check back as needed

## 2021-11-08 NOTE — Progress Notes (Signed)
I, [name], LAT, ATC acting as a scribe for [name], MD.  Paula [name] is a 76 y.o. female who presents to [facility] today for f/u chronic L knee pain thought to be due to medial compartment DJD and unsteady gait. Pt was last seen by Dr. Georgina Snell on 08/08/21 and was advised to finish out PT, completing 17 total visits. Today, pt reports last week L knee pain worsen. Once pt made her f/u appoint, the pain improved.  Dx imaging: 06/27/21 L knee XR  Pertinent review of systems: No fevers or chills  Relevant historical information: Hypertension   Exam:  BP 124/76   Pulse 71   Ht '5\' 4"'$  (1.626 m)   Wt 181 lb 6.4 oz (82.3 kg)   SpO2 96%   BMI 31.14 kg/m  General: Well Developed, well nourished, and in no acute distress.   MSK: Left knee: Genu valgus appearance with mild effusion. Normal motion with crepitation.  Tender palpation medial joint line.    Lab and Radiology Results  Procedure: Real-time Ultrasound Guided Injection of left knee superior lateral patellar space Device: Philips Affiniti 50G Images permanently stored and available for review in PACS Verbal informed consent obtained.  Discussed risks and benefits of procedure. Warned about infection, bleeding, hyperglycemia damage to structures among others. Patient expresses understanding and agreement Time-out conducted.   Noted no overlying erythema, induration, or other signs of local infection.   Skin prepped in a sterile fashion.   Local anesthesia: Topical Ethyl chloride.   With sterile technique and under real time ultrasound guidance: 40 mg of Kenalog and 2 mL of Marcaine injected into knee joint. Fluid seen entering the joint capsule.   Completed without difficulty   Pain immediately resolved suggesting accurate placement of the medication.   Advised to call if fevers/chills, erythema, induration, drainage, or persistent bleeding.   Images permanently stored and available  for review in the ultrasound unit.  Impression: Technically successful ultrasound guided injection.    EXAM: LEFT KNEE 3 VIEWS   COMPARISON:  None.   FINDINGS: Osteopenia. No acute fracture or dislocation. Severe degenerative changes of the medial compartment with near bone-on-bone apposition, subchondral cyst formation, osteophyte formation and osseous remodeling. Mild degenerative changes of the patellofemoral compartment. No area of erosion or osseous destruction. No unexpected radiopaque foreign body. Trace knee joint effusion.   IMPRESSION: Severe degenerative changes of the medial compartment.     Electronically Signed   By: Valentino Saxon M.D.   On: 06/28/2021 07:54   I, [name], personally (independently) visualized and performed the interpretation of the images attached in this note.      Assessment and Plan: 76 y.o. female with left knee pain due to DJD exacerbation.  Plan for steroid injection today.  Also recommend continued Voltaren gel and quad strengthening.  Recheck back as needed.   PDMP not reviewed this encounter. Orders Placed This Encounter  Procedures   [place] LIMITED JOINT SPACE STRUCTURES LOW LEFT(NO LINKED CHARGES)    Order Specific Question:   Reason for Exam (SYMPTOM  OR DIAGNOSIS REQUIRED)    Answer:   L knee pain    Order Specific Question:   Preferred imaging location?    Answer:   [place]   No orders of the defined types were placed in this encounter.    Discussed warning signs or symptoms. Please see discharge instructions. Patient expresses understanding.   The above documentation has been  reviewed and is accurate and complete [name], M.D.

## 2021-11-17 DIAGNOSIS — F3131 Bipolar disorder, current episode depressed, mild: Secondary | ICD-10-CM | POA: Diagnosis not present

## 2021-12-03 ENCOUNTER — Other Ambulatory Visit: Payer: Self-pay | Admitting: Internal Medicine

## 2021-12-03 DIAGNOSIS — E782 Mixed hyperlipidemia: Secondary | ICD-10-CM

## 2021-12-03 NOTE — Telephone Encounter (Signed)
Please refill as per office routine med refill policy (all routine meds to be refilled for 3 mo or monthly (per pt preference) up to one year from last visit, then month to month grace period for 3 mo, then further med refills will have to be denied) ? ?

## 2021-12-14 ENCOUNTER — Ambulatory Visit: Payer: Medicare Other | Admitting: Internal Medicine

## 2021-12-19 ENCOUNTER — Ambulatory Visit (INDEPENDENT_AMBULATORY_CARE_PROVIDER_SITE_OTHER): Payer: Medicare Other | Admitting: Internal Medicine

## 2021-12-19 ENCOUNTER — Other Ambulatory Visit: Payer: Self-pay | Admitting: Internal Medicine

## 2021-12-19 ENCOUNTER — Encounter: Payer: Self-pay | Admitting: Internal Medicine

## 2021-12-19 VITALS — BP 122/64 | HR 76 | Temp 98.3°F | Ht 64.0 in | Wt 181.0 lb

## 2021-12-19 DIAGNOSIS — N1832 Chronic kidney disease, stage 3b: Secondary | ICD-10-CM

## 2021-12-19 DIAGNOSIS — F3181 Bipolar II disorder: Secondary | ICD-10-CM

## 2021-12-19 DIAGNOSIS — R7303 Prediabetes: Secondary | ICD-10-CM

## 2021-12-19 DIAGNOSIS — E559 Vitamin D deficiency, unspecified: Secondary | ICD-10-CM

## 2021-12-19 DIAGNOSIS — E782 Mixed hyperlipidemia: Secondary | ICD-10-CM | POA: Diagnosis not present

## 2021-12-19 DIAGNOSIS — I10 Essential (primary) hypertension: Secondary | ICD-10-CM

## 2021-12-19 DIAGNOSIS — E538 Deficiency of other specified B group vitamins: Secondary | ICD-10-CM

## 2021-12-19 DIAGNOSIS — N3281 Overactive bladder: Secondary | ICD-10-CM

## 2021-12-19 DIAGNOSIS — R252 Cramp and spasm: Secondary | ICD-10-CM

## 2021-12-19 LAB — BASIC METABOLIC PANEL
BUN: 31 mg/dL — ABNORMAL HIGH (ref 6–23)
CO2: 27 mEq/L (ref 19–32)
Calcium: 10.2 mg/dL (ref 8.4–10.5)
Chloride: 103 mEq/L (ref 96–112)
Creatinine, Ser: 1.33 mg/dL — ABNORMAL HIGH (ref 0.40–1.20)
GFR: 38.83 mL/min — ABNORMAL LOW (ref >60.00)
Glucose, Bld: 102 mg/dL — ABNORMAL HIGH (ref 70–99)
Potassium: 4.3 mEq/L (ref 3.5–5.1)
Sodium: 140 mEq/L (ref 135–145)

## 2021-12-19 LAB — LIPID PANEL
Cholesterol: 188 mg/dL (ref 0–200)
HDL: 78.2 mg/dL (ref >39.00)
LDL Cholesterol: 87 mg/dL (ref 0–99)
NonHDL: 110.22
Total CHOL/HDL Ratio: 2
Triglycerides: 116 mg/dL (ref 0.0–149.0)
VLDL: 23.2 mg/dL (ref 0.0–40.0)

## 2021-12-19 LAB — HEPATIC FUNCTION PANEL
ALT: 28 U/L (ref 0–35)
AST: 23 U/L (ref 0–37)
Albumin: 4.4 g/dL (ref 3.5–5.2)
Alkaline Phosphatase: 71 U/L (ref 39–117)
Bilirubin, Direct: 0.1 mg/dL (ref 0.0–0.3)
Total Bilirubin: 0.4 mg/dL (ref 0.2–1.2)
Total Protein: 7.3 g/dL (ref 6.0–8.3)

## 2021-12-19 LAB — URINALYSIS, ROUTINE W REFLEX MICROSCOPIC
Bilirubin Urine: NEGATIVE
Hgb urine dipstick: NEGATIVE
Ketones, ur: NEGATIVE
Nitrite: NEGATIVE
Specific Gravity, Urine: 1.015 (ref 1.000–1.030)
Total Protein, Urine: NEGATIVE
Urine Glucose: NEGATIVE
Urobilinogen, UA: 0.2 (ref 0.0–1.0)
pH: 7 (ref 5.0–8.0)

## 2021-12-19 LAB — CBC WITH DIFFERENTIAL/PLATELET
Basophils Absolute: 0 10*3/uL (ref 0.0–0.1)
Basophils Relative: 0.7 % (ref 0.0–3.0)
Eosinophils Absolute: 0.2 10*3/uL (ref 0.0–0.7)
Eosinophils Relative: 4.1 % (ref 0.0–5.0)
HCT: 41.3 % (ref 36.0–46.0)
Hemoglobin: 13.8 g/dL (ref 12.0–15.0)
Lymphocytes Relative: 27.4 % (ref 12.0–46.0)
Lymphs Abs: 1.7 10*3/uL (ref 0.7–4.0)
MCHC: 33.4 g/dL (ref 30.0–36.0)
MCV: 99.5 fl (ref 78.0–100.0)
Monocytes Absolute: 0.6 10*3/uL (ref 0.1–1.0)
Monocytes Relative: 9.8 % (ref 3.0–12.0)
Neutro Abs: 3.5 10*3/uL (ref 1.4–7.7)
Neutrophils Relative %: 58 % (ref 43.0–77.0)
Platelets: 171 10*3/uL (ref 150.0–400.0)
RBC: 4.15 Mil/uL (ref 3.87–5.11)
RDW: 12.7 % (ref 11.5–15.5)
WBC: 6 10*3/uL (ref 4.0–10.5)

## 2021-12-19 LAB — MAGNESIUM: Magnesium: 2.1 mg/dL (ref 1.5–2.5)

## 2021-12-19 LAB — HEMOGLOBIN A1C: Hgb A1c MFr Bld: 6 % (ref 4.6–6.5)

## 2021-12-19 LAB — TSH: TSH: 0.29 u[IU]/mL — ABNORMAL LOW (ref 0.35–5.50)

## 2021-12-19 LAB — VITAMIN D 25 HYDROXY (VIT D DEFICIENCY, FRACTURES): VITD: 63.1 ng/mL (ref 30.00–100.00)

## 2021-12-19 LAB — VITAMIN B12: Vitamin B-12: 549 pg/mL (ref 211–911)

## 2021-12-19 MED ORDER — MIRABEGRON ER 50 MG PO TB24
50.0000 mg | ORAL_TABLET | Freq: Every day | ORAL | 3 refills | Status: DC
Start: 2021-12-19 — End: 2022-03-09

## 2021-12-19 MED ORDER — CYCLOBENZAPRINE HCL 5 MG PO TABS: 5.0000 mg | ORAL_TABLET | Freq: Three times a day (TID) | ORAL | 2 refills | Status: DC | PRN

## 2021-12-19 MED ORDER — SOLIFENACIN SUCCINATE 5 MG PO TABS: 5.0000 mg | ORAL_TABLET | Freq: Every day | ORAL | 3 refills | Status: DC

## 2021-12-19 NOTE — Progress Notes (Unsigned)
Patient ID: Paula [name], female   DOB: February 08, 1946, 76 y.o.   MRN: [id number]        Chief Complaint: follow up OAB, ckd, bipolar, muscle cramps, preDM, htn       HPI:  Paula [name] is a 76 y.o. female here overall doing ok, Pt denies chest pain, increased sob or doe, wheezing, orthopnea, PND, increased LE swelling, palpitations, dizziness or syncope  Pt denies polydipsia, polyuria, or new focal neuro s/s.   Denies worsening depressive symptoms, suicidal ideation, or panic; asks for depakote level, still see psychiatry on regular basis.  Denies urinary symptoms such as dysuria, urgency, flank pain, hematuria or n/v, fever, chills, but has 2 months worsening OAB symptoms and detrol just not working as well, asks for change in med tx.  Also hsa very frequent muscle cramping multiple areas each day, usually short lived, asks for lab testing.         Wt Readings from Last 3 Encounters:  12/19/21 181 lb (82.1 kg)  11/08/21 181 lb 6.4 oz (82.3 kg)  08/08/21 180 lb 6.4 oz (81.8 kg)   BP Readings from Last 3 Encounters:  12/19/21 122/64  11/08/21 124/76  08/08/21 120/68         Past Medical History:  Diagnosis Date   Allergy    Arthritis    Asthma    Bipolar 1 disorder (HCC)    Colon polyps    Depression    Diabetes mellitus without complication (HCC)    GERD (gastroesophageal reflux disease)    History of alcohol abuse    History of chicken pox    History of drug abuse ([place])    Hyperlipidemia    Hypertension    Urine incontinence    Past Surgical History:  Procedure Laterality Date   APPENDECTOMY     BREAST BIOPSY     CHOLECYSTECTOMY     LUNG BIOPSY     SUBTOTAL COLECTOMY      reports that she quit smoking about 5 years ago. Her smoking use included cigarettes. She has a 55.00 pack-year smoking history. She has never used smokeless tobacco. She reports current alcohol use of about 4.0 - 6.0 standard drinks of alcohol per week. She reports current drug use. Frequency: 7.00  times per week. Drug: Marijuana. family history includes Arthritis in her father; COPD in her mother; Diabetes in her father; Heart disease in her father, paternal grandmother, and paternal uncle; Hyperlipidemia in her mother. Allergies  Allergen Reactions   Penicillins    Current Outpatient Medications on File Prior to Visit  Medication Sig Dispense Refill   amLODipine (NORVASC) 5 MG tablet TAKE 1 TABLET BY MOUTH EVERY DAY 90 tablet 1   aspirin EC 81 MG tablet Take 81 mg by mouth daily.     Cholecalciferol (VITAMIN D3) 2000 units TABS Take by mouth.     divalproex (DEPAKOTE ER) 250 MG 24 hr tablet Take 750 mg by mouth every morning.  4   ezetimibe (ZETIA) 10 MG tablet TAKE 1 TABLET BY MOUTH EVERY DAY 30 tablet 0   famotidine (PEPCID) 20 MG tablet TAKE 1 TABLET BY MOUTH TWICE A DAY 180 tablet 3   FLUoxetine (PROZAC) 10 MG capsule Take 10 mg by mouth every morning.  2   glucose blood (FREESTYLE LITE) test strip Use as instructed 100 strip 3   multivitamin-iron-minerals-folic acid (CENTRUM) chewable tablet Chew 1 tablet by mouth daily.     Omega-3 Fatty Acids (FISH OIL) 1000  MG CAPS Take 2 capsules by mouth daily.     pravastatin (PRAVACHOL) 40 MG tablet TAKE 1 TABLET BY MOUTH EVERY DAY 30 tablet 0   QUEtiapine (SEROQUEL) 50 MG tablet Take 1 tablet by mouth 2 (two) times daily.   3   triamcinolone cream (KENALOG) 0.1 % APPLY TO AFFECTED AREA TWICE A DAY 30 g 1   valbenazine (INGREZZA) 40 MG capsule Take 40 mg by mouth daily.     zolpidem (AMBIEN) 10 MG tablet Take 5 mg by mouth at bedtime as needed for sleep.      No current facility-administered medications on file prior to visit.        ROS:  All others reviewed and negative.  Objective        PE:  BP 122/64 (BP Location: Left Arm, Patient Position: Sitting, Cuff Size: Large)   Pulse 76   Temp 98.3 F (36.8 C) (Oral)   Ht '5\' 4"'$  (1.626 m)   Wt 181 lb (82.1 kg)   SpO2 96%   BMI 31.07 kg/m                 Constitutional: Pt  appears in NAD               HENT: Head: NCAT.                Right Ear: External ear normal.                 Left Ear: External ear normal.                Eyes: . Pupils are equal, round, and reactive to light. Conjunctivae and EOM are normal               Nose: without d/c or deformity               Neck: Neck supple. Gross normal ROM               Cardiovascular: Normal rate and regular rhythm.                 Pulmonary/Chest: Effort normal and breath sounds without rales or wheezing.                Abd:  Soft, NT, ND, + BS, no organomegaly               Neurological: Pt is alert. At baseline orientation, motor grossly intact               Skin: Skin is warm. No rashes, no other new lesions, LE edema - none               Psychiatric: Pt behavior is normal without agitation   Micro: none  Cardiac tracings I have personally interpreted today:  none  Pertinent Radiological findings (summarize): none   Lab Results  Component Value Date   WBC 6.0 12/19/2021   HGB 13.8 12/19/2021   HCT 41.3 12/19/2021   PLT 171.0 12/19/2021   GLUCOSE 102 (H) 12/19/2021   CHOL 188 12/19/2021   TRIG 116.0 12/19/2021   HDL 78.20 12/19/2021   LDLDIRECT 73.0 06/16/2021   LDLCALC 87 12/19/2021   ALT 28 12/19/2021   AST 23 12/19/2021   NA 140 12/19/2021   K 4.3 12/19/2021   CL 103 12/19/2021   CREATININE 1.33 (H) 12/19/2021   BUN 31 (H) 12/19/2021   CO2 27 12/19/2021   TSH 0.29 (L)  12/19/2021   HGBA1C 6.0 12/19/2021   MICROALBUR 0.9 12/14/2020   Assessment/Plan:  Paula [name] is a 76 y.o. White or Caucasian [1] female with  has a past medical history of Allergy, Arthritis, Asthma, Bipolar 1 disorder ([place]), Colon polyps, Depression, Diabetes mellitus without complication ([place]), GERD (gastroesophageal reflux disease), History of alcohol abuse, History of chicken pox, History of drug abuse ([place]), Hyperlipidemia, Hypertension, and Urine incontinence.  Bipolar 2 disorder ([place]) Ok for depakote  level, cont depakote no change and f/u psychiatry as planned  Pre-diabetes Lab Results  Component Value Date   HGBA1C 6.0 12/19/2021   Stable, pt to continue current medical treatment  - diet, wt control, excercise   Overactive bladder Uncontrolled, ok for change detrol to vesicare 5 mg qd if ok with insurance  Mixed hyperlipidemia Lab Results  Component Value Date   LDLCALC 87 12/19/2021   Stable, pt to continue current statin pravachol 40 mg qd, zetia 10 mg qd   Essential hypertension BP Readings from Last 3 Encounters:  12/19/21 122/64  11/08/21 124/76  08/08/21 120/68   Stable, pt to continue medical treatment norvasc 5 mg qd   CKD (chronic kidney disease) stage 3, GFR 30-59 ml/min (HCC) Lab Results  Component Value Date   CREATININE 1.33 (H) 12/19/2021   Stable overall, cont to avoid nephrotoxins   Muscle cramps Etiology unclear, for magnesium with labs, also for flexeril prn  Followup: Return in about 6 months (around 06/21/2022).  Cathlean Cower, MD 12/21/2021 12:57 PM [facility]

## 2021-12-19 NOTE — Patient Instructions (Signed)
Please take all new medication as prescribed - the flexeril muscle relaxer as needed  Ok to change the Detrol 4 mg to the Vesicare 5 mg, but please call in 1 month if not improving, for Urology referral  Please continue all other medications as before, and refills have been done if requested.  Please have the pharmacy call with any other refills you may need.  Please continue your efforts at being more active, low cholesterol diet, and weight control.  Please keep your appointments with your specialists as you may have planned  Please go to the LAB at the blood drawing area for the tests to be done, including the depakote level You will be contacted by phone if any changes need to be made immediately.  Otherwise, you will receive a letter about your results with an explanation, but please check with MyChart first.  Please remember to sign up for MyChart if you have not done so, as this will be important to you in the future with finding out test results, communicating by private email, and scheduling acute appointments online when needed.  Please make an Appointment to return in 6 months, or sooner if needed

## 2021-12-19 NOTE — Telephone Encounter (Signed)
Spoke with patient's prescription plan [employer] and they stated that patient would need to try and fail Myrbetriq, Oxybutynin, or Gemtesa prior to taking Vesicare.

## 2021-12-19 NOTE — Telephone Encounter (Signed)
Instead of PA for vesicare, ok for trial myrbetrix - please to let pt know, as this is very well tolerated and apparently is preferred with her insurance

## 2021-12-19 NOTE — Telephone Encounter (Signed)
Ok for PA as detrol no longer working

## 2021-12-20 LAB — VALPROIC ACID LEVEL: Valproic Acid Lvl: 66 mg/L (ref 50.0–100.0)

## 2021-12-21 ENCOUNTER — Encounter: Payer: Self-pay | Admitting: Internal Medicine

## 2021-12-21 DIAGNOSIS — R252 Cramp and spasm: Secondary | ICD-10-CM | POA: Insufficient documentation

## 2021-12-21 NOTE — Assessment & Plan Note (Signed)
Uncontrolled, ok for change detrol to vesicare 5 mg qd if ok with insurance

## 2021-12-21 NOTE — Assessment & Plan Note (Signed)
BP Readings from Last 3 Encounters:  12/19/21 122/64  11/08/21 124/76  08/08/21 120/68   Stable, pt to continue medical treatment norvasc 5 mg qd

## 2021-12-21 NOTE — Assessment & Plan Note (Signed)
Lab Results  Component Value Date   HGBA1C 6.0 12/19/2021   Stable, pt to continue current medical treatment  - diet, wt control, excercise

## 2021-12-21 NOTE — Assessment & Plan Note (Signed)
Ok for depakote level, cont depakote no change and f/u psychiatry as planned

## 2021-12-21 NOTE — Assessment & Plan Note (Signed)
Lab Results  Component Value Date   LDLCALC 87 12/19/2021   Stable, pt to continue current statin pravachol 40 mg qd, zetia 10 mg qd

## 2021-12-21 NOTE — Assessment & Plan Note (Signed)
Etiology unclear, for magnesium with labs, also for flexeril prn

## 2021-12-21 NOTE — Assessment & Plan Note (Signed)
Lab Results  Component Value Date   CREATININE 1.33 (H) 12/19/2021   Stable overall, cont to avoid nephrotoxins

## 2021-12-31 ENCOUNTER — Other Ambulatory Visit: Payer: Self-pay | Admitting: Internal Medicine

## 2021-12-31 DIAGNOSIS — E782 Mixed hyperlipidemia: Secondary | ICD-10-CM

## 2021-12-31 NOTE — Telephone Encounter (Signed)
Please refill as per office routine med refill policy (all routine meds to be refilled for 3 mo or monthly (per pt preference) up to one year from last visit, then month to month grace period for 3 mo, then further med refills will have to be denied) ? ?

## 2022-01-11 ENCOUNTER — Other Ambulatory Visit: Payer: Self-pay | Admitting: Internal Medicine

## 2022-01-15 ENCOUNTER — Other Ambulatory Visit: Payer: Self-pay | Admitting: Internal Medicine

## 2022-01-15 DIAGNOSIS — E782 Mixed hyperlipidemia: Secondary | ICD-10-CM

## 2022-01-15 NOTE — Telephone Encounter (Signed)
Please refill as per office routine med refill policy (all routine meds to be refilled for 3 mo or monthly (per pt preference) up to one year from last visit, then month to month grace period for 3 mo, then further med refills will have to be denied) ? ?

## 2022-01-18 DIAGNOSIS — H524 Presbyopia: Secondary | ICD-10-CM | POA: Diagnosis not present

## 2022-01-18 DIAGNOSIS — H04123 Dry eye syndrome of bilateral lacrimal glands: Secondary | ICD-10-CM | POA: Diagnosis not present

## 2022-01-18 DIAGNOSIS — E119 Type 2 diabetes mellitus without complications: Secondary | ICD-10-CM | POA: Diagnosis not present

## 2022-01-18 DIAGNOSIS — H18593 Other hereditary corneal dystrophies, bilateral: Secondary | ICD-10-CM | POA: Diagnosis not present

## 2022-01-29 ENCOUNTER — Other Ambulatory Visit: Payer: Self-pay | Admitting: Internal Medicine

## 2022-02-06 ENCOUNTER — Other Ambulatory Visit: Payer: Self-pay | Admitting: Internal Medicine

## 2022-02-06 NOTE — Telephone Encounter (Signed)
Please refill as per office routine med refill policy (all routine meds to be refilled for 3 mo or monthly (per pt preference) up to one year from last visit, then month to month grace period for 3 mo, then further med refills will have to be denied) ? ?

## 2022-02-21 DIAGNOSIS — F3131 Bipolar disorder, current episode depressed, mild: Secondary | ICD-10-CM | POA: Diagnosis not present

## 2022-03-09 ENCOUNTER — Ambulatory Visit (INDEPENDENT_AMBULATORY_CARE_PROVIDER_SITE_OTHER): Payer: Medicare Other | Admitting: Internal Medicine

## 2022-03-09 ENCOUNTER — Encounter: Payer: Self-pay | Admitting: Internal Medicine

## 2022-03-09 ENCOUNTER — Ambulatory Visit (INDEPENDENT_AMBULATORY_CARE_PROVIDER_SITE_OTHER): Payer: Medicare Other

## 2022-03-09 VITALS — BP 124/76 | HR 76 | Temp 97.6°F | Ht 64.0 in | Wt 186.0 lb

## 2022-03-09 DIAGNOSIS — Z23 Encounter for immunization: Secondary | ICD-10-CM | POA: Diagnosis not present

## 2022-03-09 DIAGNOSIS — J449 Chronic obstructive pulmonary disease, unspecified: Secondary | ICD-10-CM

## 2022-03-09 DIAGNOSIS — I10 Essential (primary) hypertension: Secondary | ICD-10-CM | POA: Diagnosis not present

## 2022-03-09 DIAGNOSIS — J439 Emphysema, unspecified: Secondary | ICD-10-CM | POA: Diagnosis not present

## 2022-03-09 DIAGNOSIS — R413 Other amnesia: Secondary | ICD-10-CM | POA: Diagnosis not present

## 2022-03-09 DIAGNOSIS — R350 Frequency of micturition: Secondary | ICD-10-CM | POA: Diagnosis not present

## 2022-03-09 DIAGNOSIS — N1832 Chronic kidney disease, stage 3b: Secondary | ICD-10-CM

## 2022-03-09 DIAGNOSIS — F3181 Bipolar II disorder: Secondary | ICD-10-CM

## 2022-03-09 DIAGNOSIS — R7303 Prediabetes: Secondary | ICD-10-CM

## 2022-03-09 DIAGNOSIS — E559 Vitamin D deficiency, unspecified: Secondary | ICD-10-CM | POA: Diagnosis not present

## 2022-03-09 DIAGNOSIS — E538 Deficiency of other specified B group vitamins: Secondary | ICD-10-CM

## 2022-03-09 DIAGNOSIS — R410 Disorientation, unspecified: Secondary | ICD-10-CM | POA: Diagnosis not present

## 2022-03-09 DIAGNOSIS — E782 Mixed hyperlipidemia: Secondary | ICD-10-CM | POA: Diagnosis not present

## 2022-03-09 DIAGNOSIS — R059 Cough, unspecified: Secondary | ICD-10-CM | POA: Diagnosis not present

## 2022-03-09 LAB — LIPID PANEL
Cholesterol: 163 mg/dL (ref 0–200)
HDL: 59.9 mg/dL (ref >39.00)
LDL Cholesterol: 73 mg/dL (ref 0–99)
NonHDL: 103.4
Total CHOL/HDL Ratio: 3
Triglycerides: 152 mg/dL — ABNORMAL HIGH (ref 0.0–149.0)
VLDL: 30.4 mg/dL (ref 0.0–40.0)

## 2022-03-09 LAB — VITAMIN D 25 HYDROXY (VIT D DEFICIENCY, FRACTURES): VITD: 77.62 ng/mL (ref 30.00–100.00)

## 2022-03-09 LAB — CBC WITH DIFFERENTIAL/PLATELET
Basophils Absolute: 0.1 10*3/uL (ref 0.0–0.1)
Basophils Relative: 0.8 % (ref 0.0–3.0)
Eosinophils Absolute: 0.4 10*3/uL (ref 0.0–0.7)
Eosinophils Relative: 5.3 % — ABNORMAL HIGH (ref 0.0–5.0)
HCT: 41.3 % (ref 36.0–46.0)
Hemoglobin: 13.9 g/dL (ref 12.0–15.0)
Lymphocytes Relative: 30.5 % (ref 12.0–46.0)
Lymphs Abs: 2.2 10*3/uL (ref 0.7–4.0)
MCHC: 33.5 g/dL (ref 30.0–36.0)
MCV: 100.1 fl — ABNORMAL HIGH (ref 78.0–100.0)
Monocytes Absolute: 0.7 10*3/uL (ref 0.1–1.0)
Monocytes Relative: 10.1 % (ref 3.0–12.0)
Neutro Abs: 3.9 10*3/uL (ref 1.4–7.7)
Neutrophils Relative %: 53.3 % (ref 43.0–77.0)
Platelets: 174 10*3/uL (ref 150.0–400.0)
RBC: 4.13 Mil/uL (ref 3.87–5.11)
RDW: 12.2 % (ref 11.5–15.5)
WBC: 7.3 10*3/uL (ref 4.0–10.5)

## 2022-03-09 LAB — BASIC METABOLIC PANEL
BUN: 33 mg/dL — ABNORMAL HIGH (ref 6–23)
CO2: 31 mEq/L (ref 19–32)
Calcium: 10.2 mg/dL (ref 8.4–10.5)
Chloride: 102 mEq/L (ref 96–112)
Creatinine, Ser: 1.58 mg/dL — ABNORMAL HIGH (ref 0.40–1.20)
GFR: 31.53 mL/min — ABNORMAL LOW (ref >60.00)
Glucose, Bld: 84 mg/dL (ref 70–99)
Potassium: 4.5 mEq/L (ref 3.5–5.1)
Sodium: 139 mEq/L (ref 135–145)

## 2022-03-09 LAB — HEPATIC FUNCTION PANEL
ALT: 27 U/L (ref 0–35)
AST: 30 U/L (ref 0–37)
Albumin: 4.2 g/dL (ref 3.5–5.2)
Alkaline Phosphatase: 70 U/L (ref 39–117)
Bilirubin, Direct: 0.1 mg/dL (ref 0.0–0.3)
Total Bilirubin: 0.3 mg/dL (ref 0.2–1.2)
Total Protein: 7.3 g/dL (ref 6.0–8.3)

## 2022-03-09 LAB — HEMOGLOBIN A1C: Hgb A1c MFr Bld: 5.9 % (ref 4.6–6.5)

## 2022-03-09 LAB — VITAMIN B12: Vitamin B-12: 762 pg/mL (ref 211–911)

## 2022-03-09 LAB — TSH: TSH: 0.37 u[IU]/mL (ref 0.35–5.50)

## 2022-03-09 MED ORDER — QUETIAPINE FUMARATE 50 MG PO TABS: 50.0000 mg | ORAL_TABLET | Freq: Two times a day (BID) | ORAL | 0 refills | Status: DC

## 2022-03-09 MED ORDER — FAMOTIDINE 20 MG PO TABS: ORAL_TABLET | ORAL | 3 refills | Status: DC

## 2022-03-09 MED ORDER — EZETIMIBE 10 MG PO TABS: 10.0000 mg | ORAL_TABLET | Freq: Every day | ORAL | 3 refills | Status: DC

## 2022-03-09 MED ORDER — MIRABEGRON ER 50 MG PO TB24: 50.0000 mg | ORAL_TABLET | Freq: Every day | ORAL | 3 refills | Status: DC

## 2022-03-09 MED ORDER — PRAVASTATIN SODIUM 40 MG PO TABS: 40.0000 mg | ORAL_TABLET | Freq: Every day | ORAL | 3 refills | Status: DC

## 2022-03-09 MED ORDER — DIVALPROEX SODIUM ER 250 MG PO TB24: 750.0000 mg | ORAL_TABLET | Freq: Every morning | ORAL | 0 refills | Status: DC

## 2022-03-09 NOTE — Assessment & Plan Note (Signed)
Lab Results  Component Value Date   CREATININE 1.33 (H) 12/19/2021   Stable overall, cont to avoid nephrotoxins

## 2022-03-09 NOTE — Assessment & Plan Note (Signed)
Lab Results  Component Value Date   HGBA1C 6.0 12/19/2021   Stable, pt to continue current medical treatment  - diet, wt control, excercise

## 2022-03-09 NOTE — Assessment & Plan Note (Addendum)
Stable, cont in haler prn,  to f/u any worsening symptoms or concerns, for cxr

## 2022-03-09 NOTE — Assessment & Plan Note (Signed)
BP Readings from Last 3 Encounters:  03/09/22 124/76  12/19/21 122/64  11/08/21 124/76   Stable, pt to continue off amlodipine for now, cont diet, wt control, ecxercise

## 2022-03-09 NOTE — Assessment & Plan Note (Signed)
Also for urine culture with labs r/o uti

## 2022-03-09 NOTE — Progress Notes (Signed)
Patient ID: Paula [name], female   DOB: 07-26-1945, 76 y.o.   MRN: [id number]        Chief Complaint: follow up with son  - htn, recent memory loss/ changing, copd, bipolar       HPI:  Paula [name] is a 76 y.o. female here with son for above, pt states not taking psych meds recently having run out, son says nexxt psychiatry appt not for 10 days, asks for bridge meds.  Pt not taking amodipine recently with lower bp's at home.   Pt denies polydipsia, polyuria, or new focal neuro s/s, but does have mild urinary frequency.  Has had 1-2 mo worsening memory changes and confusion at times, less ability to take and understand her meds.   Motor skills not as fluid, confusion worsening, slurring speech, and recurrent falls without injury. Now using walker. Wt Readings from Last 3 Encounters:  03/09/22 186 lb (84.4 kg)  12/19/21 181 lb (82.1 kg)  11/08/21 181 lb 6.4 oz (82.3 kg)   BP Readings from Last 3 Encounters:  03/09/22 124/76  12/19/21 122/64  11/08/21 124/76         Past Medical History:  Diagnosis Date   Allergy    Arthritis    Asthma    Bipolar 1 disorder (HCC)    Colon polyps    Depression    Diabetes mellitus without complication (HCC)    GERD (gastroesophageal reflux disease)    History of alcohol abuse    History of chicken pox    History of drug abuse ([place])    Hyperlipidemia    Hypertension    Urine incontinence    Past Surgical History:  Procedure Laterality Date   APPENDECTOMY     BREAST BIOPSY     CHOLECYSTECTOMY     LUNG BIOPSY     SUBTOTAL COLECTOMY      reports that she quit smoking about 5 years ago. Her smoking use included cigarettes. She has a 55.00 pack-year smoking history. She has never used smokeless tobacco. She reports current alcohol use of about 4.0 - 6.0 standard drinks of alcohol per week. She reports current drug use. Frequency: 7.00 times per week. Drug: Marijuana. family history includes Arthritis in her father; COPD in her mother; Diabetes  in her father; Heart disease in her father, paternal grandmother, and paternal uncle; Hyperlipidemia in her mother. Allergies  Allergen Reactions   Penicillins    Current Outpatient Medications on File Prior to Visit  Medication Sig Dispense Refill   aspirin EC 81 MG tablet Take 81 mg by mouth daily.     Cholecalciferol (VITAMIN D3) 2000 units TABS Take by mouth.     FLUoxetine (PROZAC) 10 MG capsule Take 10 mg by mouth every morning.  2   glucose blood (FREESTYLE LITE) test strip USE ASD TO CHECK BLOOD SUGARS 3 TIMES A DAY E11.9 300 strip 3   multivitamin-iron-minerals-folic acid (CENTRUM) chewable tablet Chew 1 tablet by mouth daily.     Omega-3 Fatty Acids (FISH OIL) 1000 MG CAPS Take 2 capsules by mouth daily.     triamcinolone cream (KENALOG) 0.1 % APPLY TO AFFECTED AREA TWICE A DAY 30 g 1   valbenazine (INGREZZA) 40 MG capsule Take 40 mg by mouth daily.     zolpidem (AMBIEN) 10 MG tablet Take 5 mg by mouth at bedtime as needed for sleep.      No current facility-administered medications on file prior to visit.  ROS:  All others reviewed and negative.  Objective        PE:  BP 124/76 (BP Location: Right Arm, Patient Position: Sitting, Cuff Size: Normal)   Pulse 76   Temp 97.6 F (36.4 C) (Oral)   Ht '5\' 4"'$  (1.626 m)   Wt 186 lb (84.4 kg)   SpO2 97%   BMI 31.93 kg/m                 Constitutional: Pt appears in NAD               HENT: Head: NCAT.                Right Ear: External ear normal.                 Left Ear: External ear normal.                Eyes: . Pupils are equal, round, and reactive to light. Conjunctivae and EOM are normal               Nose: without d/c or deformity               Neck: Neck supple. Gross normal ROM               Cardiovascular: Normal rate and regular rhythm.                 Pulmonary/Chest: Effort normal and breath sounds without rales or wheezing.                Abd:  Soft, NT, ND, + BS, no organomegaly                Neurological: Pt is alert. At baseline orientation, motor grossly intact               Skin: Skin is warm. No rashes, no other new lesions, LE edema - none               Psychiatric: Pt behavior is normal without agitation   Micro: none  Cardiac tracings I have personally interpreted today:  none  Pertinent Radiological findings (summarize): none   Lab Results  Component Value Date   WBC 6.0 12/19/2021   HGB 13.8 12/19/2021   HCT 41.3 12/19/2021   PLT 171.0 12/19/2021   GLUCOSE 102 (H) 12/19/2021   CHOL 188 12/19/2021   TRIG 116.0 12/19/2021   HDL 78.20 12/19/2021   LDLDIRECT 73.0 06/16/2021   LDLCALC 87 12/19/2021   ALT 28 12/19/2021   AST 23 12/19/2021   NA 140 12/19/2021   K 4.3 12/19/2021   CL 103 12/19/2021   CREATININE 1.33 (H) 12/19/2021   BUN 31 (H) 12/19/2021   CO2 27 12/19/2021   TSH 0.29 (L) 12/19/2021   HGBA1C 6.0 12/19/2021   MICROALBUR 0.9 12/14/2020   Assessment/Plan:  Paula [name] is a 76 y.o. White or Caucasian [1] female with  has a past medical history of Allergy, Arthritis, Asthma, Bipolar 1 disorder ([place]), Colon polyps, Depression, Diabetes mellitus without complication ([place]), GERD (gastroesophageal reflux disease), History of alcohol abuse, History of chicken pox, History of drug abuse ([place]), Hyperlipidemia, Hypertension, and Urine incontinence.  Bipolar 2 disorder (HCC) Pt to restart meds, f/u psychiatry in approx 10 days as planned  COPD  GOLD 0  Stable, cont in haler prn,  to f/u any worsening symptoms or concerns, for cxr  Urinary frequency Also for urine culture  with labs r/o uti  Pre-diabetes Lab Results  Component Value Date   HGBA1C 6.0 12/19/2021   Stable, pt to continue current medical treatment  - diet, wt control, excercise   Mixed hyperlipidemia Lab Results  Component Value Date   LDLCALC 87 12/19/2021   Stable, pt to continue current statin zetia 10 mg and pravachol 40 mg qd with better compliance   Memory  loss Recent worsening, difficult to assess given psychiatric disorder, for mri brain, refer neurology, fo rlabs as ordered including b12  Essential hypertension BP Readings from Last 3 Encounters:  03/09/22 124/76  12/19/21 122/64  11/08/21 124/76   Stable, pt to continue off amlodipine for now, cont diet, wt control, ecxercise   CKD (chronic kidney disease) stage 3, GFR 30-59 ml/min (HCC) Lab Results  Component Value Date   CREATININE 1.33 (H) 12/19/2021   Stable overall, cont to avoid nephrotoxins  Followup: No follow-ups on file.  Cathlean Cower, MD 03/09/2022 8:59 PM [facility]

## 2022-03-09 NOTE — Assessment & Plan Note (Signed)
Lab Results  Component Value Date   LDLCALC 87 12/19/2021   Stable, pt to continue current statin zetia 10 mg and pravachol 40 mg qd with better compliance

## 2022-03-09 NOTE — Patient Instructions (Signed)
Ok to stop the norvasc and flexeril  Please take all other medications as prescribed  Please continue all other medications as before, and refills have been done if requested.  Please have the pharmacy call with any other refills you may need.  Please continue your efforts at being more active, low cholesterol diet, and weight control.  You are otherwise up to date with prevention measures today.  Please keep your appointments with your specialists as you may have planned - psychiatry in about 10 days  You will be contacted regarding the referral for: Neurology, and MRI brain  Please go to the XRAY Department in the first floor for the x-ray testing  Please go to the LAB at the blood drawing area for the tests to be done  You will be contacted by phone if any changes need to be made immediately.  Otherwise, you will receive a letter about your results with an explanation, but please check with MyChart first.  Please remember to sign up for MyChart if you have not done so, as this will be important to you in the future with finding out test results, communicating by private email, and scheduling acute appointments online when needed.  Please make an Appointment to return in 6 months, or sooner if needed

## 2022-03-09 NOTE — Assessment & Plan Note (Signed)
Pt to restart meds, f/u psychiatry in approx 10 days as planned

## 2022-03-09 NOTE — Assessment & Plan Note (Addendum)
Recent worsening, difficult to assess given psychiatric disorder, for mri brain, refer neurology, fo rlabs as ordered including b12

## 2022-03-13 ENCOUNTER — Other Ambulatory Visit: Payer: Self-pay | Admitting: Internal Medicine

## 2022-03-13 ENCOUNTER — Other Ambulatory Visit (INDEPENDENT_AMBULATORY_CARE_PROVIDER_SITE_OTHER): Payer: Medicare Other

## 2022-03-13 DIAGNOSIS — R413 Other amnesia: Secondary | ICD-10-CM

## 2022-03-13 LAB — URINALYSIS, ROUTINE W REFLEX MICROSCOPIC
Bilirubin Urine: NEGATIVE
Hgb urine dipstick: NEGATIVE
Ketones, ur: NEGATIVE
Nitrite: NEGATIVE
RBC / HPF: NONE SEEN (ref 0–?)
Specific Gravity, Urine: 1.01 (ref 1.000–1.030)
Total Protein, Urine: NEGATIVE
Urine Glucose: NEGATIVE
Urobilinogen, UA: 0.2 (ref 0.0–1.0)
pH: 7 (ref 5.0–8.0)

## 2022-03-13 MED ORDER — NITROFURANTOIN MACROCRYSTAL 50 MG PO CAPS: 50.0000 mg | ORAL_CAPSULE | Freq: Two times a day (BID) | ORAL | 0 refills | Status: DC

## 2022-03-14 LAB — URINE CULTURE

## 2022-03-20 DIAGNOSIS — F3131 Bipolar disorder, current episode depressed, mild: Secondary | ICD-10-CM | POA: Diagnosis not present

## 2022-03-22 ENCOUNTER — Ambulatory Visit (INDEPENDENT_AMBULATORY_CARE_PROVIDER_SITE_OTHER): Payer: Medicare Other

## 2022-03-22 DIAGNOSIS — Z0000 Encounter for general adult medical examination without abnormal findings: Secondary | ICD-10-CM | POA: Diagnosis not present

## 2022-03-22 NOTE — Patient Instructions (Signed)
Paula [name] , Thank you for taking time to come for your Medicare Wellness Visit. I appreciate your ongoing commitment to your health goals. Please review the following plan we discussed and let me know if I can assist you in the future.   These are the goals we discussed:  Goals      Client understands the importance of follow-up with providers by attending scheduled visits.        This is a list of the screening recommended for you and due dates:  Health Maintenance  Topic Date Due   Zoster (Shingles) Vaccine (1 of 2) Never done   COVID-19 Vaccine (6 - Pfizer series) 04/27/2021   Tetanus Vaccine  05/26/2027   Pneumonia Vaccine  Completed   Flu Shot  Completed   DEXA scan (bone density measurement)  Completed   Hepatitis C Screening: USPSTF Recommendation to screen - Ages 45-79 yo.  Completed   HPV Vaccine  Aged Out    Advanced directives: Yes; Please bring a copy of your health care power of attorney and living will to the office at your convenience.  Conditions/risks identified: Yes  Next appointment: Follow up in one year for your annual wellness visit.   Preventive Care 49 Years and Older, Female Preventive care refers to lifestyle choices and visits with your health care provider that can promote health and wellness. What does preventive care include? A yearly physical exam. This is also called an annual well check. Dental exams once or twice a year. Routine eye exams. Ask your health care provider how often you should have your eyes checked. Personal lifestyle choices, including: Daily care of your teeth and gums. Regular physical activity. Eating a healthy diet. Avoiding tobacco and drug use. Limiting alcohol use. Practicing safe sex. Taking low-dose aspirin every day. Taking vitamin and mineral supplements as recommended by your health care provider. What happens during an annual well check? The services and screenings done by your health care provider during your  annual well check will depend on your age, overall health, lifestyle risk factors, and family history of disease. Counseling  Your health care provider may ask you questions about your: Alcohol use. Tobacco use. Drug use. Emotional well-being. Home and relationship well-being. Sexual activity. Eating habits. History of falls. Memory and ability to understand (cognition). Work and work Statistician. Reproductive health. Screening  You may have the following tests or measurements: Height, weight, and BMI. Blood pressure. Lipid and cholesterol levels. These may be checked every 5 years, or more frequently if you are over 30 years old. Skin check. Lung cancer screening. You may have this screening every year starting at age 24 if you have a 30-pack-year history of smoking and currently smoke or have quit within the past 15 years. Fecal occult blood test (FOBT) of the stool. You may have this test every year starting at age 82. Flexible sigmoidoscopy or colonoscopy. You may have a sigmoidoscopy every 5 years or a colonoscopy every 10 years starting at age 59. Hepatitis C blood test. Hepatitis B blood test. Sexually transmitted disease (STD) testing. Diabetes screening. This is done by checking your blood sugar (glucose) after you have not eaten for a while (fasting). You may have this done every 1-3 years. Bone density scan. This is done to screen for osteoporosis. You may have this done starting at age 88. Mammogram. This may be done every 1-2 years. Talk to your health care provider about how often you should have regular mammograms. Talk with  your health care provider about your test results, treatment options, and if necessary, the need for more tests. Vaccines  Your health care provider may recommend certain vaccines, such as: Influenza vaccine. This is recommended every year. Tetanus, diphtheria, and acellular pertussis (Tdap, Td) vaccine. You may need a Td booster every 10  years. Zoster vaccine. You may need this after age 60. Pneumococcal 13-valent conjugate (PCV13) vaccine. One dose is recommended after age 9. Pneumococcal polysaccharide (PPSV23) vaccine. One dose is recommended after age 36. Talk to your health care provider about which screenings and vaccines you need and how often you need them. This information is not intended to replace advice given to you by your health care provider. Make sure you discuss any questions you have with your health care provider. Document Released: 06/18/2015 Document Revised: 02/09/2016 Document Reviewed: 03/23/2015 Elsevier Interactive Patient Education  2017 [place] Prevention in the Home Falls can cause injuries. They can happen to people of all ages. There are many things you can do to make your home safe and to help prevent falls. What can I do on the outside of my home? Regularly fix the edges of walkways and driveways and fix any cracks. Remove anything that might make you trip as you walk through a door, such as a raised step or threshold. Trim any bushes or trees on the path to your home. Use bright outdoor lighting. Clear any walking paths of anything that might make someone trip, such as rocks or tools. Regularly check to see if handrails are loose or broken. Make sure that both sides of any steps have handrails. Any raised decks and porches should have guardrails on the edges. Have any leaves, snow, or ice cleared regularly. Use sand or salt on walking paths during winter. Clean up any spills in your garage right away. This includes oil or grease spills. What can I do in the bathroom? Use night lights. Install grab bars by the toilet and in the tub and shower. Do not use towel bars as grab bars. Use non-skid mats or decals in the tub or shower. If you need to sit down in the shower, use a plastic, non-slip stool. Keep the floor dry. Clean up any water that spills on the floor as soon as it  happens. Remove soap buildup in the tub or shower regularly. Attach bath mats securely with double-sided non-slip rug tape. Do not have throw rugs and other things on the floor that can make you trip. What can I do in the bedroom? Use night lights. Make sure that you have a light by your bed that is easy to reach. Do not use any sheets or blankets that are too big for your bed. They should not hang down onto the floor. Have a firm chair that has side arms. You can use this for support while you get dressed. Do not have throw rugs and other things on the floor that can make you trip. What can I do in the kitchen? Clean up any spills right away. Avoid walking on wet floors. Keep items that you use a lot in easy-to-reach places. If you need to reach something above you, use a strong step stool that has a grab bar. Keep electrical cords out of the way. Do not use floor polish or wax that makes floors slippery. If you must use wax, use non-skid floor wax. Do not have throw rugs and other things on the floor that can make you trip.  What can I do with my stairs? Do not leave any items on the stairs. Make sure that there are handrails on both sides of the stairs and use them. Fix handrails that are broken or loose. Make sure that handrails are as long as the stairways. Check any carpeting to make sure that it is firmly attached to the stairs. Fix any carpet that is loose or worn. Avoid having throw rugs at the top or bottom of the stairs. If you do have throw rugs, attach them to the floor with carpet tape. Make sure that you have a light switch at the top of the stairs and the bottom of the stairs. If you do not have them, ask someone to add them for you. What else can I do to help prevent falls? Wear shoes that: Do not have high heels. Have rubber bottoms. Are comfortable and fit you well. Are closed at the toe. Do not wear sandals. If you use a stepladder: Make sure that it is fully opened.  Do not climb a closed stepladder. Make sure that both sides of the stepladder are locked into place. Ask someone to hold it for you, if possible. Clearly mark and make sure that you can see: Any grab bars or handrails. First and last steps. Where the edge of each step is. Use tools that help you move around (mobility aids) if they are needed. These include: Canes. Walkers. Scooters. Crutches. Turn on the lights when you go into a dark area. Replace any light bulbs as soon as they burn out. Set up your furniture so you have a clear path. Avoid moving your furniture around. If any of your floors are uneven, fix them. If there are any pets around you, be aware of where they are. Review your medicines with your doctor. Some medicines can make you feel dizzy. This can increase your chance of falling. Ask your doctor what other things that you can do to help prevent falls. This information is not intended to replace advice given to you by your health care provider. Make sure you discuss any questions you have with your health care provider. Document Released: 03/18/2009 Document Revised: 10/28/2015 Document Reviewed: 06/26/2014 Elsevier Interactive Patient Education  2017 [employer].

## 2022-03-22 NOTE — Progress Notes (Signed)
Virtual Visit via Telephone Note  I connected with  Paula [name] on 03/22/22 at  1:00 PM EDT by telephone and verified that I am speaking with the correct person using two identifiers.  Location: Patient: Home Provider: [place] Persons participating in the virtual visit: [facility]   I discussed the limitations, risks, security and privacy concerns of performing an evaluation and management service by telephone and the availability of in person appointments. The patient expressed understanding and agreed to proceed.  Interactive audio and video telecommunications were attempted between this nurse and patient, however failed, due to patient having technical difficulties OR patient did not have access to video capability.  We continued and completed visit with audio only.  Some vital signs may be absent or patient reported.   Sheral Flow, LPN  Subjective:   Paula [name] is a 76 y.o. female who presents for Medicare Annual (Subsequent) preventive examination.  Review of Systems     Cardiac Risk Factors include: advanced age (>39mn, >>54women);hypertension;dyslipidemia;family history of premature cardiovascular disease;sedentary lifestyle     Objective:    There were no vitals filed for this visit. There is no height or weight on file to calculate BMI.     03/22/2022    1:04 PM 07/06/2021    1:19 PM 03/17/2021    1:07 PM 12/11/2019    2:32 PM 02/12/2017   11:30 AM  Advanced Directives  Does Patient Have a Medical Advance Directive? Yes Yes Yes Yes Yes  Type of [place]amed[place]of [place] will [place]Living will [place]Living will [place]Living will HCleveland HeightsLiving will  Does patient want to make changes to medical advance directive?    No - Patient declined   Copy of [place] Chart? No - copy requested No -  copy requested No - copy requested No - copy requested No - copy requested    Current Medications (verified) Outpatient Encounter Medications as of 03/22/2022  Medication Sig   aspirin EC 81 MG tablet Take 81 mg by mouth daily.   Cholecalciferol (VITAMIN D3) 2000 units TABS Take by mouth.   divalproex (DEPAKOTE ER) 250 MG 24 hr tablet Take 3 tablets (750 mg total) by mouth every morning.   ezetimibe (ZETIA) 10 MG tablet Take 1 tablet (10 mg total) by mouth daily.   famotidine (PEPCID) 20 MG tablet TAKE 1 TABLET BY MOUTH TWICE A DAY   FLUoxetine (PROZAC) 10 MG capsule Take 10 mg by mouth every morning.   glucose blood (FREESTYLE LITE) test strip USE ASD TO CHECK BLOOD SUGARS 3 TIMES A DAY E11.9   mirabegron ER (MYRBETRIQ) 50 MG TB24 tablet Take 1 tablet (50 mg total) by mouth daily.   multivitamin-iron-minerals-folic acid (CENTRUM) chewable tablet Chew 1 tablet by mouth daily.   nitrofurantoin (MACRODANTIN) 50 MG capsule Take 1 capsule (50 mg total) by mouth 2 (two) times daily.   Omega-3 Fatty Acids (FISH OIL) 1000 MG CAPS Take 2 capsules by mouth daily.   pravastatin (PRAVACHOL) 40 MG tablet Take 1 tablet (40 mg total) by mouth daily.   QUEtiapine (SEROQUEL) 50 MG tablet Take 1 tablet (50 mg total) by mouth 2 (two) times daily.   triamcinolone cream (KENALOG) 0.1 % APPLY TO AFFECTED AREA TWICE A DAY   valbenazine (INGREZZA) 40 MG capsule Take 40 mg by mouth daily.   zolpidem (AMBIEN) 10 MG tablet Take 5 mg  by mouth at bedtime as needed for sleep.    No facility-administered encounter medications on file as of 03/22/2022.    Allergies (verified) Penicillins   History: Past Medical History:  Diagnosis Date   Allergy    Arthritis    Asthma    Bipolar 1 disorder ([place])    Colon polyps    Depression    Diabetes mellitus without complication (HCC)    GERD (gastroesophageal reflux disease)    History of alcohol abuse    History of chicken pox    History of drug abuse ([place])     Hyperlipidemia    Hypertension    Urine incontinence    Past Surgical History:  Procedure Laterality Date   APPENDECTOMY     BREAST BIOPSY     CHOLECYSTECTOMY     LUNG BIOPSY     SUBTOTAL COLECTOMY     Family History  Problem Relation Age of Onset   Hyperlipidemia Mother    COPD Mother        smoked   Arthritis Father    Heart disease Father    Diabetes Father    Heart disease Paternal Uncle    Heart disease Paternal Grandmother    Social History   Socioeconomic History   Marital status: Single    Spouse name: Not on file   Number of children: 1   Years of education: 14   Highest education level: Not on file  Occupational History   Occupation: Retired Therapist, sports  Tobacco Use   Smoking status: Former    Packs/day: 1.00    Years: 55.00    Total pack years: 55.00    Types: Cigarettes    Quit date: 08/03/2016    Years since quitting: 5.6   Smokeless tobacco: Never  Vaping Use   Vaping Use: Never used  Substance and Sexual Activity   Alcohol use: Yes    Alcohol/week: 4.0 - 6.0 standard drinks of alcohol    Types: 4 - 6 Cans of beer per week   Drug use: Yes    Frequency: 7.0 times per week    Types: Marijuana   Sexual activity: Not on file  Other Topics Concern   Not on file  Social History Narrative   Fun/Hobby: Read   Denies abuse and feels safe at home    Social Determinants of Health   Financial Resource Strain: Low Risk  (03/22/2022)   Overall Financial Resource Strain (CARDIA)    Difficulty of Paying Living Expenses: Not hard at all  Food Insecurity: No Food Insecurity (03/22/2022)   Hunger Vital Sign    Worried About Running Out of Food in the Last Year: Never true    Ran Out of Food in the Last Year: Never true  Transportation Needs: No Transportation Needs (03/22/2022)   PRAPARE - Hydrologist (Medical): No    Lack of Transportation (Non-Medical): No  Physical Activity: Inactive (03/22/2022)   Exercise Vital Sign    Days of  Exercise per Week: 0 days    Minutes of Exercise per Session: 0 min  Stress: No Stress Concern Present (03/22/2022)   [place]    Feeling of Stress : Not at all  Social Connections: Socially Isolated (03/22/2022)   Social Connection and Isolation Panel [NHANES]    Frequency of Communication with Friends and Family: More than three times a week    Frequency of Social Gatherings with Friends and  Family: More than three times a week    Attends Religious Services: Never    Active Member of Clubs or Organizations: No    Attends Music therapist: Never    Marital Status: Divorced    Tobacco Counseling Counseling given: Not Answered   Clinical Intake:  Pre-visit preparation completed: Yes  Pain : No/denies pain     BMI - recorded: 31.93 Nutritional Status: BMI > 30  Obese Nutritional Risks: None Diabetes: No  How often do you need to have someone help you when you read instructions, pamphlets, or other written materials from your doctor or pharmacy?: 1 - Never What is the last grade level you completed in school?: HSG; Retired Therapist, sports  Diabetic? no  Interpreter Needed?: No  Information entered by :: [employer], LPN.   Activities of Daily Living    03/22/2022    1:18 PM  In your present state of health, do you have any difficulty performing the following activities:  Hearing? 0  Vision? 0  Difficulty concentrating or making decisions? 0  Walking or climbing stairs? 0  Dressing or bathing? 0  Doing errands, shopping? 0  Preparing Food and eating ? N  Using the Toilet? N  In the past six months, have you accidently leaked urine? Y  Do you have problems with loss of bowel control? N  Managing your Medications? N  Managing your Finances? N  Housekeeping or managing your Housekeeping? N    Patient Care Team: Biagio Borg, MD as PCP - General (Internal Medicine) Marygrace Drought, MD as  Consulting Physician (Ophthalmology)  Indicate any recent Medical Services you may have received from other than [facility] providers in the past year (date may be approximate).     Assessment:   This is a routine wellness examination for [place].  Hearing/Vision screen Hearing Screening - Comments:: Denies hearing difficulties   Vision Screening - Comments:: Wears rx glasses - up to date with routine eye exams with Marygrace Drought, MD.   Dietary issues and exercise activities discussed: Current Exercise Habits: The patient does not participate in regular exercise at present, Exercise limited by: respiratory conditions(s);psychological condition(s);orthopedic condition(s)   Goals Addressed             This Visit's Progress    Client understands the importance of follow-up with providers by attending scheduled visits.        Depression Screen    03/22/2022    1:08 PM 12/19/2021   11:18 AM 03/17/2021    1:12 PM 12/14/2020    1:37 PM 06/14/2020    1:34 PM 12/11/2019    1:42 PM 12/02/2018    3:36 PM  PHQ 2/9 Scores  PHQ - 2 Score 0 1 0 1 1 0 1  PHQ- 9 Score  3         Fall Risk    03/22/2022    1:05 PM 12/19/2021   11:18 AM 03/17/2021    1:08 PM 06/14/2020    1:34 PM 12/11/2019    1:42 PM  Fall Risk   Falls in the past year? 1 1 0 0 0  Number falls in past yr: 1 0 0  0  Injury with Fall? 1 0 0  0  Risk for fall due to : History of fall(s);Impaired balance/gait;Orthopedic patient  No Fall Risks  No Fall Risks  Follow up Education provided;Falls prevention discussed  Falls evaluation completed  Falls evaluation completed    FALL RISK PREVENTION PERTAINING TO  THE HOME:  Any stairs in or around the home? No  If so, are there any without handrails? No  Home free of loose throw rugs in walkways, pet beds, electrical cords, etc? Yes  Adequate lighting in your home to reduce risk of falls? Yes   ASSISTIVE DEVICES UTILIZED TO PREVENT FALLS:  Life alert? No  Use of a cane, walker  or w/c? No  Grab bars in the bathroom? Yes  Shower chair or bench in shower? Yes  Elevated toilet seat or a handicapped toilet? Yes   TIMED UP AND GO:  Was the test performed? No . Phone Visit   Cognitive Function:    02/12/2017   11:38 AM  MMSE - Mini Mental State Exam  Orientation to time 5  Orientation to Place 5  Registration 3  Attention/ Calculation 5  Recall 2  Language- name 2 objects 2  Language- repeat 1  Language- follow 3 step command 3  Language- read & follow direction 1  Write a sentence 1  Copy design 1  Total score 29        03/22/2022    1:19 PM 12/11/2019    2:35 PM  6CIT Screen  What Year? 0 points 0 points  What month? 0 points 0 points  What time? 0 points 3 points  Count back from 20 0 points 0 points  Months in reverse 0 points 0 points  Repeat phrase 0 points 0 points  Total Score 0 points 3 points    Immunizations Immunization History  Administered Date(s) Administered   Fluad Quad(high Dose 65+) 03/09/2022   Influenza, High Dose Seasonal PF 01/30/2017, 02/06/2017, 03/13/2018, 02/18/2019   Influenza-Unspecified 02/03/2017, 03/04/2020, 03/02/2021   PFIZER(Purple Top)SARS-COV-2 Vaccination 08/11/2019, 09/10/2019, 03/20/2020, 09/21/2020, 03/02/2021   Pneumococcal Conjugate-13 02/12/2017   Pneumococcal Polysaccharide-23 12/02/2018   Tdap 05/25/2017    TDAP status: Up to date  Flu Vaccine status: Up to date  Pneumococcal vaccine status: Up to date  Covid-19 vaccine status: Completed vaccines  Qualifies for Shingles Vaccine? Yes   Zostavax completed No   Shingrix Completed?: No.    Education has been provided regarding the importance of this vaccine. Patient has been advised to call insurance company to determine out of pocket expense if they have not yet received this vaccine. Advised may also receive vaccine at local pharmacy or Health Dept. Verbalized acceptance and understanding.  Screening Tests Health Maintenance  Topic Date  Due   Zoster Vaccines- Shingrix (1 of 2) Never done   COVID-19 Vaccine (6 - Pfizer series) 04/27/2021   TETANUS/TDAP  05/26/2027   Pneumonia Vaccine 69+ Years old  Completed   INFLUENZA VACCINE  Completed   DEXA SCAN  Completed   Hepatitis C Screening  Completed   HPV VACCINES  Aged Out    Health Maintenance  Health Maintenance Due  Topic Date Due   Zoster Vaccines- Shingrix (1 of 2) Never done   COVID-19 Vaccine (6 - [place] series) 04/27/2021    Colorectal cancer screening: No longer required.   Mammogram status: No longer required due to patient declined.  Bone Density status: Completed 03/30/2017. Results reflect: Bone density results: OSTEOPENIA. Repeat every 2-3 years.  Lung Cancer Screening: (Low Dose CT Chest recommended if Age 28-80 years, 30 pack-year currently smoking OR have quit w/in 15years.) does not qualify.   Lung Cancer Screening Referral: no  Additional Screening:  Hepatitis C Screening: does qualify; Completed 03/13/2018  Vision Screening: Recommended annual ophthalmology exams for early detection  of glaucoma and other disorders of the eye. Is the patient up to date with their annual eye exam?  Yes  Who is the provider or what is the name of the office in which the patient attends annual eye exams? Marygrace Drought, MD. If pt is not established with a provider, would they like to be referred to a provider to establish care? No .   Dental Screening: Recommended annual dental exams for proper oral hygiene  Community Resource Referral / Chronic Care Management: CRR required this visit?  No   CCM required this visit?  No      Plan:     I have personally reviewed and noted the following in the patient's chart:   Medical and social history Use of alcohol, tobacco or illicit drugs  Current medications and supplements including opioid prescriptions. Patient is not currently taking opioid prescriptions. Functional ability and status Nutritional  status Physical activity Advanced directives List of other physicians Hospitalizations, surgeries, and ER visits in previous 12 months Vitals Screenings to include cognitive, depression, and falls Referrals and appointments  In addition, I have reviewed and discussed with patient certain preventive protocols, quality metrics, and best practice recommendations. A written personalized care plan for preventive services as well as general preventive health recommendations were provided to patient.     Sheral Flow, LPN   [id number]   Nurse Notes: N/A

## 2022-04-04 ENCOUNTER — Ambulatory Visit
Admission: RE | Admit: 2022-04-04 | Discharge: 2022-04-04 | Disposition: A | Discharge status: Home / Self Care | Payer: Medicare Other | Source: Ambulatory Visit | Attending: Internal Medicine | Admitting: Internal Medicine

## 2022-04-04 DIAGNOSIS — G319 Degenerative disease of nervous system, unspecified: Secondary | ICD-10-CM | POA: Diagnosis not present

## 2022-04-04 DIAGNOSIS — R413 Other amnesia: Secondary | ICD-10-CM | POA: Diagnosis not present

## 2022-04-04 DIAGNOSIS — I6782 Cerebral ischemia: Secondary | ICD-10-CM | POA: Diagnosis not present

## 2022-04-04 DIAGNOSIS — G9389 Other specified disorders of brain: Secondary | ICD-10-CM | POA: Diagnosis not present

## 2022-04-10 ENCOUNTER — Ambulatory Visit (INDEPENDENT_AMBULATORY_CARE_PROVIDER_SITE_OTHER): Payer: Medicare Other | Admitting: Neurology

## 2022-04-10 ENCOUNTER — Encounter: Payer: Self-pay | Admitting: Neurology

## 2022-04-10 VITALS — BP 137/77 | HR 73 | Ht 64.0 in | Wt 180.0 lb

## 2022-04-10 DIAGNOSIS — G3184 Mild cognitive impairment, so stated: Secondary | ICD-10-CM

## 2022-04-10 NOTE — Progress Notes (Unsigned)
[facility]  PATIENT: Paula [name] DOB: 1946/04/11  REQUESTING CLINICIAN: Biagio Borg, MD HISTORY FROM: Patient  REASON FOR VISIT: Word finding difficulty    HISTORICAL  CHIEF COMPLAINT:  Chief Complaint  Patient presents with   New Patient (Initial Visit)    Rm 12, alone NP Internal referral for memory loss States memory issues started 3 months ago     HISTORY OF PRESENT ILLNESS:  This is a 76 year old woman past medical history of Bipolar disorder, hypertension, hyperlipidemia who is presenting for memory loss.  Patient reports in the end of August, she was scrambling her words, having difficulty remembering. At that time she was diagnosed with UTI.  She was prescribed antibiotics and her symptoms improved.  She stated the word scrambling improved and currently she does not have any deficit.  She lives alone and is independent in all ADLs.  Currently no symptoms no concern.   TBI:   No past history of TBI Stroke:   no past history of stroke Seizures:   no past history of seizures Sleep:    no history of sleep apnea.   Mood:   patient denies anxiety and depression Family history of Dementia:   Denies  Functional status: independent in all ADLs and IADLs Patient lives alone. Cooking: patient  Cleaning: patient  Shopping: patient  Bathing: patient  Toileting: patient  Driving: patient  Bills: Yes   Ever left the stove on by accident?: no  Forget how to use items around the house?: denies  Getting lost going to familiar places?: denies  Forgetting loved ones names?: denies  Word finding difficulty? Denies  Sleep: Good    OTHER MEDICAL CONDITIONS: Bipolar disorder, Hypertension, Hyperlipidemia    REVIEW OF SYSTEMS: Full 14 system review of systems performed and negative with exception of: As noted in the HPI   ALLERGIES: Allergies  Allergen Reactions   Penicillins     HOME MEDICATIONS: Outpatient Medications Prior to Visit   Medication Sig Dispense Refill   aspirin EC 81 MG tablet Take 81 mg by mouth daily.     Cholecalciferol (VITAMIN D3) 2000 units TABS Take by mouth.     divalproex (DEPAKOTE ER) 250 MG 24 hr tablet Take 3 tablets (750 mg total) by mouth every morning. 30 tablet 0   ezetimibe (ZETIA) 10 MG tablet Take 1 tablet (10 mg total) by mouth daily. 90 tablet 3   famotidine (PEPCID) 20 MG tablet TAKE 1 TABLET BY MOUTH TWICE A DAY 180 tablet 3   FLUoxetine (PROZAC) 10 MG capsule Take 10 mg by mouth every morning.  2   glucose blood (FREESTYLE LITE) test strip USE ASD TO CHECK BLOOD SUGARS 3 TIMES A DAY E11.9 300 strip 3   mirabegron ER (MYRBETRIQ) 50 MG TB24 tablet Take 1 tablet (50 mg total) by mouth daily. 90 tablet 3   multivitamin-iron-minerals-folic acid (CENTRUM) chewable tablet Chew 1 tablet by mouth daily.     Omega-3 Fatty Acids (FISH OIL) 1000 MG CAPS Take 2 capsules by mouth daily.     pravastatin (PRAVACHOL) 40 MG tablet Take 1 tablet (40 mg total) by mouth daily. 90 tablet 3   QUEtiapine (SEROQUEL) 50 MG tablet Take 1 tablet (50 mg total) by mouth 2 (two) times daily. 60 tablet 0   triamcinolone cream (KENALOG) 0.1 % APPLY TO AFFECTED AREA TWICE A DAY 30 g 1   valbenazine (INGREZZA) 40 MG capsule Take 40 mg by mouth daily.  zolpidem (AMBIEN) 10 MG tablet Take 5 mg by mouth at bedtime as needed for sleep.      nitrofurantoin (MACRODANTIN) 50 MG capsule Take 1 capsule (50 mg total) by mouth 2 (two) times daily. 14 capsule 0   No facility-administered medications prior to visit.    PAST MEDICAL HISTORY: Past Medical History:  Diagnosis Date   Allergy    Arthritis    Asthma    Bipolar 1 disorder ([place])    Colon polyps    Depression    Diabetes mellitus without complication (HCC)    GERD (gastroesophageal reflux disease)    History of alcohol abuse    History of chicken pox    History of drug abuse ([place])    Hyperlipidemia    Hypertension    Urine incontinence     PAST SURGICAL  HISTORY: Past Surgical History:  Procedure Laterality Date   APPENDECTOMY     BREAST BIOPSY     CHOLECYSTECTOMY     LUNG BIOPSY     SUBTOTAL COLECTOMY      FAMILY HISTORY: Family History  Problem Relation Age of Onset   Hyperlipidemia Mother    COPD Mother        smoked   Arthritis Father    Heart disease Father    Diabetes Father    Heart disease Paternal Uncle    Heart disease Paternal Grandmother     SOCIAL HISTORY: Social History   Socioeconomic History   Marital status: Single    Spouse name: Not on file   Number of children: 1   Years of education: 14   Highest education level: Not on file  Occupational History   Occupation: Retired Therapist, sports  Tobacco Use   Smoking status: Former    Packs/day: 1.00    Years: 55.00    Total pack years: 55.00    Types: Cigarettes    Quit date: 08/03/2016    Years since quitting: 5.6   Smokeless tobacco: Never  Vaping Use   Vaping Use: Never used  Substance and Sexual Activity   Alcohol use: Yes    Alcohol/week: 4.0 - 6.0 standard drinks of alcohol    Types: 4 - 6 Cans of beer per week   Drug use: Yes    Frequency: 7.0 times per week    Types: Marijuana   Sexual activity: Not on file  Other Topics Concern   Not on file  Social History Narrative   Fun/Hobby: Read   Denies abuse and feels safe at home    Social Determinants of Health   Financial Resource Strain: Low Risk  (03/22/2022)   Overall Financial Resource Strain (CARDIA)    Difficulty of Paying Living Expenses: Not hard at all  Food Insecurity: No Food Insecurity (03/22/2022)   Hunger Vital Sign    Worried About Running Out of Food in the Last Year: Never true    Ran Out of Food in the Last Year: Never true  Transportation Needs: No Transportation Needs (03/22/2022)   PRAPARE - Hydrologist (Medical): No    Lack of Transportation (Non-Medical): No  Physical Activity: Inactive (03/22/2022)   Exercise Vital Sign    Days of Exercise  per Week: 0 days    Minutes of Exercise per Session: 0 min  Stress: No Stress Concern Present (03/22/2022)   Falls    Feeling of Stress : Not at all  Social Connections: Socially  Isolated (03/22/2022)   Social Connection and Isolation Panel [NHANES]    Frequency of Communication with Friends and Family: More than three times a week    Frequency of Social Gatherings with Friends and Family: More than three times a week    Attends Religious Services: Never    Marine scientist or Organizations: No    Attends Archivist Meetings: Never    Marital Status: Divorced  Human resources officer Violence: Not At Risk (03/22/2022)   Humiliation, Afraid, Rape, and Kick questionnaire    Fear of Current or Ex-Partner: No    Emotionally Abused: No    Physically Abused: No    Sexually Abused: No    PHYSICAL EXAM  GENERAL EXAM/CONSTITUTIONAL: Vitals:  Vitals:   04/10/22 1400  BP: 137/77  Pulse: 73  Weight: 180 lb (81.6 kg)  Height: '5\' 4"'$  (1.626 m)   Body mass index is 30.9 kg/m. Wt Readings from Last 3 Encounters:  04/10/22 180 lb (81.6 kg)  03/09/22 186 lb (84.4 kg)  12/19/21 181 lb (82.1 kg)   Patient is in no distress; well developed, nourished and groomed; neck is supple   EYES: Pupils round and reactive to light, Visual fields full to confrontation, Extraocular movements intacts,   MUSCULOSKELETAL: Gait, strength, tone, movements noted in Neurologic exam below  NEUROLOGIC: MENTAL STATUS:     02/12/2017   11:38 AM  MMSE - Mini Mental State Exam  Orientation to time 5  Orientation to Place 5  Registration 3  Attention/ Calculation 5  Recall 2  Language- name 2 objects 2  Language- repeat 1  Language- follow 3 step command 3  Language- read & follow direction 1  Write a sentence 1  Copy design 1  Total score 29      04/10/2022    1:53 PM  Montreal Cognitive Assessment   Visuospatial/  Executive (0/5) 3  Naming (0/3) 3  Attention: Read list of digits (0/2) 2  Attention: Read list of letters (0/1) 1  Attention: Serial 7 subtraction starting at 100 (0/3) 2  Language: Repeat phrase (0/2) 2  Language : Fluency (0/1) 1  Abstraction (0/2) 2  Delayed Recall (0/5) 1  Orientation (0/6) 6  Total 23     CRANIAL NERVE:  2nd, 3rd, 4th, 6th - pupils equal and reactive to light, visual fields full to confrontation, extraocular muscles intact, no nystagmus 5th - facial sensation symmetric 7th - facial strength symmetric 8th - hearing intact 9th - palate elevates symmetrically, uvula midline 11th - shoulder shrug symmetric 12th - tongue protrusion midline  MOTOR:  normal bulk and tone, full strength in the BUE, BLE  SENSORY:  normal and symmetric to light touch, vibration  COORDINATION:  finger-nose-finger, fine finger movements normal  REFLEXES:  deep tendon reflexes present and symmetric    DIAGNOSTIC DATA (LABS, IMAGING, TESTING) - I reviewed patient records, labs, notes, testing and imaging myself where available.  Lab Results  Component Value Date   WBC 7.3 03/09/2022   HGB 13.9 03/09/2022   HCT 41.3 03/09/2022   MCV 100.1 (H) 03/09/2022   PLT 174.0 03/09/2022      Component Value Date/Time   NA 139 03/09/2022 1539   K 4.5 03/09/2022 1539   CL 102 03/09/2022 1539   CO2 31 03/09/2022 1539   GLUCOSE 84 03/09/2022 1539   BUN 33 (H) 03/09/2022 1539   CREATININE 1.58 (H) 03/09/2022 1539   CREATININE 1.44 (H) 12/11/2019 1445   CALCIUM 10.2  03/09/2022 1539   PROT 7.3 03/09/2022 1539   ALBUMIN 4.2 03/09/2022 1539   AST 30 03/09/2022 1539   ALT 27 03/09/2022 1539   ALKPHOS 70 03/09/2022 1539   BILITOT 0.3 03/09/2022 1539   GFRNONAA 45 (L) 07/31/2017 1106   GFRAA 53 (L) 07/31/2017 1106   Lab Results  Component Value Date   CHOL 163 03/09/2022   HDL 59.90 03/09/2022   LDLCALC 73 03/09/2022   LDLDIRECT 73.0 06/16/2021   TRIG 152.0 (H) 03/09/2022    CHOLHDL 3 03/09/2022   Lab Results  Component Value Date   HGBA1C 5.9 03/09/2022   Lab Results  Component Value Date   GYIRSWNI62 703 03/09/2022   Lab Results  Component Value Date   TSH 0.37 03/09/2022     ASSESSMENT AND PLAN  76 y.o. year old female with history of bipolar disorder, hypertension, hyperlipidemia who is presenting with complaint of word finding difficulty, scrambling her words and memory problem.  This was in the setting of an acute UTI.  Since patient was treated with antibiotics her symptoms improved.  Today on exam she scored a 23 out of 30 on the [place] which is indicative of some impairment.  Based on overall picture I do believe patient have mild cognitive impairment. She is still independent, lives alone.  We discussed medication, Aricept, including side effects but she would like to defer.  Plan will be for patient to continue with PCP and to return for any worsening symptoms.  Her latest Vitamin B12 and TSH were are within normal limits. She is comfortable with plans.   1. Mild cognitive impairment      Patient Instructions  Continue current medications  Follow up with PCP  Return as needed of if symptoms worsen     There are well-accepted and sensible ways to reduce risk for Alzheimers disease and other degenerative brain disorders .  Exercise Daily Walk A daily 20 minute walk should be part of your routine. Disease related apathy can be a significant roadblock to exercise and the only way to overcome this is to make it a daily routine and perhaps have a reward at the end (something your loved one loves to eat or drink perhaps) or a personal trainer coming to the home can also be very useful. Most importantly, the patient is much more likely to exercise if the caregiver / spouse does it with him/her. In general a structured, repetitive schedule is best.  General Health: Any diseases which effect your body will effect your brain such as a pneumonia, urinary  infection, blood clot, heart attack or stroke. Keep contact with your primary care doctor for regular follow ups.  Sleep. A good nights sleep is healthy for the brain. Seven hours is recommended. If you have insomnia or poor sleep habits we can give you some instructions. If you have sleep apnea wear your mask.  Diet: Eating a heart healthy diet is also a good idea; fish and poultry instead of red meat, nuts (mostly non-peanuts), vegetables, fruits, olive oil or canola oil (instead of butter), minimal salt (use other spices to flavor foods), whole grain rice, bread, cereal and pasta and wine in moderation.Research is now showing that the MIND diet, which is a combination of The Mediterranean diet and the DASH diet, is beneficial for cognitive processing and longevity. Information about this diet can be found in The MIND Diet, a book by Doyne Keel, MS, RDN, and online at [employer]bookDistributors.si  [employer], Power of Attorney  and Advance Directives: You should consider putting legal safeguards in place with regard to financial and medical decision making. While the spouse always has power of attorney for medical and financial issues in the absence of any form, you should consider what you want in case the spouse / caregiver is no longer around or capable of making decisions.   No orders of the defined types were placed in this encounter.   No orders of the defined types were placed in this encounter.   Return if symptoms worsen or fail to improve.  I have spent a total of 45 minutes dedicated to this patient today, preparing to see patient, performing a medically appropriate examination and evaluation, ordering tests and/or medications and procedures, and counseling and educating the patient/family/caregiver; independently interpreting result and communicating results to the family/patient/caregiver; and documenting clinical information in the electronic medical  record.   Alric Ran, MD 04/11/2022, 6:32 PM  [facility] [address], [place], [place] [address] [phone]

## 2022-04-10 NOTE — Patient Instructions (Signed)
     There are well-accepted and sensible ways to reduce risk for Alzheimers disease and other degenerative brain disorders .  Exercise Daily Walk A daily 20 minute walk should be part of your routine. Disease related apathy can be a significant roadblock to exercise and the only way to overcome this is to make it a daily routine and perhaps have a reward at the end (something your loved one loves to eat or drink perhaps) or a personal trainer coming to the home can also be very useful. Most importantly, the patient is much more likely to exercise if the caregiver / spouse does it with him/her. In general a structured, repetitive schedule is best.  General Health: Any diseases which effect your body will effect your brain such as a pneumonia, urinary infection, blood clot, heart attack or stroke. Keep contact with your primary care doctor for regular follow ups.  Sleep. A good nights sleep is healthy for the brain. Seven hours is recommended. If you have insomnia or poor sleep habits we can give you some instructions. If you have sleep apnea wear your mask.  Diet: Eating a heart healthy diet is also a good idea; fish and poultry instead of red meat, nuts (mostly non-peanuts), vegetables, fruits, olive oil or canola oil (instead of butter), minimal salt (use other spices to flavor foods), whole grain rice, bread, cereal and pasta and wine in moderation.Research is now showing that the MIND diet, which is a combination of The Mediterranean diet and the DASH diet, is beneficial for cognitive processing and longevity. Information about this diet can be found in The MIND Diet, a book by Doyne Keel, MS, RDN, and online at [employer]book[employer].si  Finances, Power of Attorney and Advance Directives: You should consider putting legal safeguards in place with regard to financial and medical decision making. While the spouse always has power of attorney for medical and financial issues in  the absence of any form, you should consider what you want in case the spouse / caregiver is no longer around or capable of making decisions.

## 2022-05-15 ENCOUNTER — Telehealth: Payer: Self-pay | Admitting: Internal Medicine

## 2022-05-15 NOTE — Telephone Encounter (Signed)
Patient states that Paula [name] recently treated her with antibiotics for bladder infection and symptoms have returned. Requests call back at [phone].

## 2022-05-16 ENCOUNTER — Ambulatory Visit (INDEPENDENT_AMBULATORY_CARE_PROVIDER_SITE_OTHER): Payer: Medicare Other | Admitting: Family Medicine

## 2022-05-16 ENCOUNTER — Encounter: Payer: Self-pay | Admitting: Family Medicine

## 2022-05-16 VITALS — BP 126/70 | HR 90 | Temp 97.6°F | Ht 64.0 in | Wt 188.0 lb

## 2022-05-16 DIAGNOSIS — R3915 Urgency of urination: Secondary | ICD-10-CM | POA: Diagnosis not present

## 2022-05-16 DIAGNOSIS — N3000 Acute cystitis without hematuria: Secondary | ICD-10-CM

## 2022-05-16 DIAGNOSIS — R350 Frequency of micturition: Secondary | ICD-10-CM

## 2022-05-16 LAB — POCT URINALYSIS DIPSTICK
Bilirubin, UA: NEGATIVE
Blood, UA: NEGATIVE
Glucose, UA: NEGATIVE
Ketones, UA: NEGATIVE
Nitrite, UA: NEGATIVE
Protein, UA: NEGATIVE
Spec Grav, UA: 1.015 (ref 1.010–1.025)
Urobilinogen, UA: 0.2 E.U./dL
pH, UA: 6 (ref 5.0–8.0)

## 2022-05-16 MED ORDER — NITROFURANTOIN MONOHYD MACRO 100 MG PO CAPS: 100.0000 mg | ORAL_CAPSULE | Freq: Two times a day (BID) | ORAL | 0 refills | Status: DC

## 2022-05-16 NOTE — Patient Instructions (Signed)
Take the antibiotic as prescribed and follow up if you are getting worse or not improving in the next 3-4 days.  Drink plenty of water.

## 2022-05-16 NOTE — Progress Notes (Signed)
Subjective:  Paula [name] is a 76 y.o. female who complains of possible urinary tract infection.  She has had symptoms for 1 days.  Symptoms include  urinary frequency, urgency and low back pain . Patient denies  fever, chills, abdominal pain, N/V/D .  Last UTI was October and she took [place].   Using nothing for current symptoms    Past Medical History:  Diagnosis Date   Allergy    Arthritis    Asthma    Bipolar 1 disorder ([place])    Colon polyps    Depression    Diabetes mellitus without complication (HCC)    GERD (gastroesophageal reflux disease)    History of alcohol abuse    History of chicken pox    History of drug abuse ([place])    Hyperlipidemia    Hypertension    Urine incontinence     ROS as in subjective  Reviewed allergies, medications, past medical, surgical, and social history.    Objective: Vitals:   05/16/22 1143  BP: 126/70  Pulse: 90  Temp: 97.6 F (36.4 C)  SpO2: 97%    General appearance: alert, no distress, WD/WN, female Abdomen: +bs, soft, non tender, non distended Back: no CVA tenderness GU: deferred     Laboratory:  Urine dipstick: negative for hemoglobin and 3+ for leukocyte esterase.       Assessment: Acute cystitis without hematuria - Plan: nitrofurantoin, macrocrystal-monohydrate, (MACROBID) 100 MG capsule  Urinary frequency - Plan: POCT urinalysis dipstick, Urine Culture, Urine Culture  Urinary urgency - Plan: POCT urinalysis dipstick, Urine Culture, Urine Culture   Plan: Discussed symptoms, diagnosis, possible complications, and usual course of illness.  Reviewed EMR and she did well with Macrobid in October. Macrobid prescribed today.  Advised increased water intake, can use OTC Tylenol for pain.        Urine culture sent.  Call or return if worse or not improving.

## 2022-05-16 NOTE — Telephone Encounter (Signed)
Called Paula [name] to see how she was doing, was able to schedule her with Vickie this morning at 11:40.

## 2022-05-17 DIAGNOSIS — F3131 Bipolar disorder, current episode depressed, mild: Secondary | ICD-10-CM | POA: Diagnosis not present

## 2022-05-17 LAB — URINE CULTURE: Result:: NO GROWTH

## 2022-05-18 NOTE — Progress Notes (Signed)
Please make sure she gets her message: Your urine culture did not show any growth and therefore you do not have a urinary tract infection.  You may stop the antibiotic and follow-up if your urinary symptoms do not resolve.

## 2022-06-05 DIAGNOSIS — C539 Malignant neoplasm of cervix uteri, unspecified: Secondary | ICD-10-CM

## 2022-06-05 HISTORY — DX: Malignant neoplasm of cervix uteri, unspecified: C53.9

## 2022-06-19 ENCOUNTER — Other Ambulatory Visit: Payer: Self-pay | Admitting: Internal Medicine

## 2022-06-19 ENCOUNTER — Ambulatory Visit (INDEPENDENT_AMBULATORY_CARE_PROVIDER_SITE_OTHER): Payer: Medicare Other | Admitting: Internal Medicine

## 2022-06-19 VITALS — BP 116/64 | HR 76 | Temp 98.0°F | Ht 64.0 in | Wt 184.0 lb

## 2022-06-19 DIAGNOSIS — R7303 Prediabetes: Secondary | ICD-10-CM | POA: Diagnosis not present

## 2022-06-19 DIAGNOSIS — E538 Deficiency of other specified B group vitamins: Secondary | ICD-10-CM

## 2022-06-19 DIAGNOSIS — I10 Essential (primary) hypertension: Secondary | ICD-10-CM | POA: Diagnosis not present

## 2022-06-19 DIAGNOSIS — N1832 Chronic kidney disease, stage 3b: Secondary | ICD-10-CM

## 2022-06-19 DIAGNOSIS — R350 Frequency of micturition: Secondary | ICD-10-CM

## 2022-06-19 DIAGNOSIS — E559 Vitamin D deficiency, unspecified: Secondary | ICD-10-CM | POA: Diagnosis not present

## 2022-06-19 DIAGNOSIS — J449 Chronic obstructive pulmonary disease, unspecified: Secondary | ICD-10-CM | POA: Diagnosis not present

## 2022-06-19 DIAGNOSIS — Z0001 Encounter for general adult medical examination with abnormal findings: Secondary | ICD-10-CM | POA: Diagnosis not present

## 2022-06-19 DIAGNOSIS — F3181 Bipolar II disorder: Secondary | ICD-10-CM

## 2022-06-19 DIAGNOSIS — R7989 Other specified abnormal findings of blood chemistry: Secondary | ICD-10-CM

## 2022-06-19 LAB — LIPID PANEL
Cholesterol: 149 mg/dL (ref 0–200)
HDL: 60.2 mg/dL (ref >39.00)
LDL Cholesterol: 70 mg/dL (ref 0–99)
NonHDL: 88.85
Total CHOL/HDL Ratio: 2
Triglycerides: 96 mg/dL (ref 0.0–149.0)
VLDL: 19.2 mg/dL (ref 0.0–40.0)

## 2022-06-19 LAB — URINALYSIS, ROUTINE W REFLEX MICROSCOPIC
Bilirubin Urine: NEGATIVE
Hgb urine dipstick: NEGATIVE
Ketones, ur: NEGATIVE
Nitrite: NEGATIVE
RBC / HPF: NONE SEEN (ref 0–?)
Specific Gravity, Urine: 1.01 (ref 1.000–1.030)
Total Protein, Urine: NEGATIVE
Urine Glucose: NEGATIVE
Urobilinogen, UA: 0.2 (ref 0.0–1.0)
pH: 6.5 (ref 5.0–8.0)

## 2022-06-19 LAB — BASIC METABOLIC PANEL
BUN: 29 mg/dL — ABNORMAL HIGH (ref 6–23)
CO2: 27 mEq/L (ref 19–32)
Calcium: 9.6 mg/dL (ref 8.4–10.5)
Chloride: 103 mEq/L (ref 96–112)
Creatinine, Ser: 1.42 mg/dL — ABNORMAL HIGH (ref 0.40–1.20)
GFR: 35.77 mL/min — ABNORMAL LOW (ref >60.00)
Glucose, Bld: 104 mg/dL — ABNORMAL HIGH (ref 70–99)
Potassium: 4.3 mEq/L (ref 3.5–5.1)
Sodium: 137 mEq/L (ref 135–145)

## 2022-06-19 LAB — CBC WITH DIFFERENTIAL/PLATELET
Basophils Absolute: 0 10*3/uL (ref 0.0–0.1)
Basophils Relative: 0.7 % (ref 0.0–3.0)
Eosinophils Absolute: 0.4 10*3/uL (ref 0.0–0.7)
Eosinophils Relative: 7 % — ABNORMAL HIGH (ref 0.0–5.0)
HCT: 39.9 % (ref 36.0–46.0)
Hemoglobin: 13.5 g/dL (ref 12.0–15.0)
Lymphocytes Relative: 28.6 % (ref 12.0–46.0)
Lymphs Abs: 1.8 10*3/uL (ref 0.7–4.0)
MCHC: 33.7 g/dL (ref 30.0–36.0)
MCV: 99.1 fl (ref 78.0–100.0)
Monocytes Absolute: 0.7 10*3/uL (ref 0.1–1.0)
Monocytes Relative: 10.6 % (ref 3.0–12.0)
Neutro Abs: 3.3 10*3/uL (ref 1.4–7.7)
Neutrophils Relative %: 53.1 % (ref 43.0–77.0)
Platelets: 151 10*3/uL (ref 150.0–400.0)
RBC: 4.03 Mil/uL (ref 3.87–5.11)
RDW: 12.6 % (ref 11.5–15.5)
WBC: 6.2 10*3/uL (ref 4.0–10.5)

## 2022-06-19 LAB — VITAMIN B12: Vitamin B-12: 529 pg/mL (ref 211–911)

## 2022-06-19 LAB — HEMOGLOBIN A1C: Hgb A1c MFr Bld: 6.1 % (ref 4.6–6.5)

## 2022-06-19 LAB — VITAMIN D 25 HYDROXY (VIT D DEFICIENCY, FRACTURES): VITD: 58.5 ng/mL (ref 30.00–100.00)

## 2022-06-19 LAB — HEPATIC FUNCTION PANEL
ALT: 15 U/L (ref 0–35)
AST: 20 U/L (ref 0–37)
Albumin: 3.9 g/dL (ref 3.5–5.2)
Alkaline Phosphatase: 65 U/L (ref 39–117)
Bilirubin, Direct: 0.1 mg/dL (ref 0.0–0.3)
Total Bilirubin: 0.3 mg/dL (ref 0.2–1.2)
Total Protein: 7.1 g/dL (ref 6.0–8.3)

## 2022-06-19 LAB — TSH: TSH: 0.17 u[IU]/mL — ABNORMAL LOW (ref 0.35–5.50)

## 2022-06-19 MED ORDER — CIPROFLOXACIN HCL 500 MG PO TABS: 500.0000 mg | ORAL_TABLET | Freq: Two times a day (BID) | ORAL | 0 refills | Status: AC

## 2022-06-19 NOTE — Progress Notes (Unsigned)
Patient ID: Paula [name], female   DOB: [birth year], 77 y.o.   MRN: [id number]         Chief Complaint:: wellness exam and 14mofollow up (Bladder issues, urinary urgency)  , bipolar illness, htn, ckd, copd, preDM       HPI:  Paula JOSEis a 77y.o. female here for wellness exam; declines covid booster or pulm referral for lung cacner screening, o/w up to date                        Also has recent uti oct 2023, tx with antibx, now with worsening urinary urgency and frequency, and new very mild leakage.  Denies urinary symptoms such as dysuria, flank pain, hematuria or n/v, fever, chills.  Pt denies chest pain, increased sob or doe, wheezing, orthopnea, PND, increased LE swelling, palpitations, dizziness or syncope.   Pt denies polydipsia, polyuria, or new focal neuro s/s.    Pt denies unusual wt loss, night sweats, loss of appetite, or other constitutional symptoms  Denies worsening depressive symptoms, suicidal ideation, or panic; has ongoing anxiety.  Asks for depakote level, has f/u with psychiatry soon.     Wt Readings from Last 3 Encounters:  06/19/22 184 lb (83.5 kg)  05/16/22 188 lb (85.3 kg)  04/10/22 180 lb (81.6 kg)   BP Readings from Last 3 Encounters:  06/19/22 116/64  05/16/22 126/70  04/10/22 137/77   Immunization History  Administered Date(s) Administered   Fluad Quad(high Dose 65+) 03/09/2022   Influenza, High Dose Seasonal PF 01/30/2017, 02/06/2017, 03/13/2018, 02/18/2019   Influenza-Unspecified 02/03/2017, 03/04/2020, 03/02/2021   PFIZER(Purple Top)SARS-COV-2 Vaccination 08/11/2019, 09/10/2019, 03/20/2020, 09/21/2020, 03/02/2021   Pneumococcal Conjugate-13 02/12/2017   Pneumococcal Polysaccharide-23 12/02/2018   Tdap 05/25/2017   Health Maintenance Due  Topic Date Due   Lung Cancer Screening  Never done   COVID-19 Vaccine (6 - 2023-24 season) 02/03/2022      Past Medical History:  Diagnosis Date   Allergy    Arthritis    Asthma    Bipolar 1 disorder  ([facility]    Colon polyps    Depression    Diabetes mellitus without complication ([facility]    GERD (gastroesophageal reflux disease)    History of alcohol abuse    History of chicken pox    History of drug abuse ([place]    Hyperlipidemia    Hypertension    Urine incontinence    Past Surgical History:  Procedure Laterality Date   APPENDECTOMY     BREAST BIOPSY     CHOLECYSTECTOMY     LUNG BIOPSY     SUBTOTAL COLECTOMY      reports that she quit smoking about 5 years ago. Her smoking use included cigarettes. She has a 55.00 pack-year smoking history. She has never used smokeless tobacco. She reports current alcohol use of about 4.0 - 6.0 standard drinks of alcohol per week. She reports current drug use. Frequency: 7.00 times per week. Drug: Marijuana. family history includes Arthritis in her father; COPD in her mother; Diabetes in her father; Heart disease in her father, paternal grandmother, and paternal uncle; Hyperlipidemia in her mother. Allergies  Allergen Reactions   Penicillins    Current Outpatient Medications on File Prior to Visit  Medication Sig Dispense Refill   aspirin EC 81 MG tablet Take 81 mg by mouth daily.     Cholecalciferol (VITAMIN D3) 2000 units TABS Take by mouth.     divalproex (  DEPAKOTE ER) 250 MG 24 hr tablet Take 3 tablets (750 mg total) by mouth every morning. 30 tablet 0   ezetimibe (ZETIA) 10 MG tablet Take 1 tablet (10 mg total) by mouth daily. 90 tablet 3   famotidine (PEPCID) 20 MG tablet TAKE 1 TABLET BY MOUTH TWICE A DAY 180 tablet 3   FLUoxetine (PROZAC) 10 MG capsule Take 10 mg by mouth every morning.  2   glucose blood (FREESTYLE LITE) test strip USE ASD TO CHECK BLOOD SUGARS 3 TIMES A DAY E11.9 (Patient taking differently: daily. USE ASD TO CHECK BLOOD SUGARS 1 TIME A DAY E11.9) 300 strip 3   mirabegron ER (MYRBETRIQ) 50 MG TB24 tablet Take 1 tablet (50 mg total) by mouth daily. 90 tablet 3   multivitamin-iron-minerals-folic acid (CENTRUM) chewable  tablet Chew 1 tablet by mouth daily.     Omega-3 Fatty Acids (FISH OIL) 1000 MG CAPS Take 2 capsules by mouth daily.     pravastatin (PRAVACHOL) 40 MG tablet Take 1 tablet (40 mg total) by mouth daily. 90 tablet 3   QUEtiapine (SEROQUEL) 50 MG tablet Take 1 tablet (50 mg total) by mouth 2 (two) times daily. 60 tablet 0   triamcinolone cream (KENALOG) 0.1 % APPLY TO AFFECTED AREA TWICE A DAY 30 g 1   valbenazine (INGREZZA) 40 MG capsule Take 40 mg by mouth daily.     zolpidem (AMBIEN) 10 MG tablet Take 5 mg by mouth at bedtime as needed for sleep.      nitrofurantoin, macrocrystal-monohydrate, (MACROBID) 100 MG capsule Take 1 capsule (100 mg total) by mouth 2 (two) times daily. (Patient not taking: Reported on 06/19/2022) 10 capsule 0   No current facility-administered medications on file prior to visit.        ROS:  All others reviewed and negative.  Objective        PE:  BP 116/64 (BP Location: Right Arm, Patient Position: Sitting, Cuff Size: Large)   Pulse 76   Temp 98 F (36.7 C) (Oral)   Ht '5\' 4"'$  (1.626 m)   Wt 184 lb (83.5 kg)   SpO2 95%   BMI 31.58 kg/m                 Constitutional: Pt appears in NAD               HENT: Head: NCAT.                Right Ear: External ear normal.                 Left Ear: External ear normal.                Eyes: . Pupils are equal, round, and reactive to light. Conjunctivae and EOM are normal               Nose: without d/c or deformity               Neck: Neck supple. Gross normal ROM               Cardiovascular: Normal rate and regular rhythm.                 Pulmonary/Chest: Effort normal and breath sounds without rales or wheezing.                Abd:  Soft, mild tender low mid abd,, ND, + BS, no organomegaly  Neurological: Pt is alert. At baseline orientation, motor grossly intact               Skin: Skin is warm. No rashes, no other new lesions, LE edema - none               Psychiatric: Pt behavior is normal without  agitation   Micro: none  Cardiac tracings I have personally interpreted today:  none  Pertinent Radiological findings (summarize): none   Lab Results  Component Value Date   WBC 6.2 06/19/2022   HGB 13.5 06/19/2022   HCT 39.9 06/19/2022   PLT 151.0 06/19/2022   GLUCOSE 104 (H) 06/19/2022   CHOL 149 06/19/2022   TRIG 96.0 06/19/2022   HDL 60.20 06/19/2022   LDLDIRECT 73.0 06/16/2021   LDLCALC 70 06/19/2022   ALT 15 06/19/2022   AST 20 06/19/2022   NA 137 06/19/2022   K 4.3 06/19/2022   CL 103 06/19/2022   CREATININE 1.42 (H) 06/19/2022   BUN 29 (H) 06/19/2022   CO2 27 06/19/2022   TSH 0.17 (L) 06/19/2022   HGBA1C 6.1 06/19/2022   MICROALBUR 0.9 12/14/2020   Assessment/Plan:  Paula [name] is a 77 y.o. White or Caucasian [1] female with  has a past medical history of Allergy, Arthritis, Asthma, Bipolar 1 disorder ([place]), Colon polyps, Depression, Diabetes mellitus without complication ([place]), GERD (gastroesophageal reflux disease), History of alcohol abuse, History of chicken pox, History of drug abuse ([place]), Hyperlipidemia, Hypertension, and Urine incontinence.  Bipolar 2 disorder (HCC) Overall stable it seems, for depakote level, f/u psychiatry as planned  COPD  GOLD 0  Stable, to continue inhaler prn  Encounter for well adult exam with abnormal findings Age and sex appropriate education and counseling updated with regular exercise and diet Referrals for preventative services  - declines pulm referral for LDCT screening Immunizations addressed - declines covid booster Smoking counseling  - none needed Evidence for depression or other mood disorder - stable bipolar  Most recent labs reviewed. I have personally reviewed and have noted: 1) the patient's medical and social history 2) The patient's current medications and supplements 3) The patient's height, weight, and BMI have been recorded in the chart   CKD (chronic kidney disease) stage 3, GFR 30-59 ml/min  (HCC) Lab Results  Component Value Date   CREATININE 1.42 (H) 06/19/2022   Stable overall, cont to avoid nephrotoxins   Essential hypertension BP Readings from Last 3 Encounters:  06/19/22 116/64  05/16/22 126/70  04/10/22 137/77   Stable, pt to continue medical treatment  - diet, wt control   Pre-diabetes Lab Results  Component Value Date   HGBA1C 6.1 06/19/2022   Stable, pt to continue current medical treatment  - diet, wt control   Urinary frequency Recent onset with urgency and small incontinence also new, likely UTI - for empiric cipro, and urine cultrue  Followup: Return in about 6 months (around 12/18/2022).  Cathlean Cower, MD 06/20/2022 4:55 AM [facility]

## 2022-06-19 NOTE — Patient Instructions (Addendum)
Please have your Shingrix (shingles) shots done at your local pharmacy.  Please take all new medication as prescribed - the cipro antibiotic  Please continue all other medications as before, and refills have been done if requested.  Please have the pharmacy call with any other refills you may need.  Please continue your efforts at being more active, low cholesterol diet, and weight control.  You are otherwise up to date with prevention measures today.  Please keep your appointments with your specialists as you may have planned  Please go to the LAB at the blood drawing area for the tests to be done, including the depakote level  You will be contacted by phone if any changes need to be made immediately.  Otherwise, you will receive a letter about your results with an explanation, but please check with MyChart first.  Please remember to sign up for MyChart if you have not done so, as this will be important to you in the future with finding out test results, communicating by private email, and scheduling acute appointments online when needed.  Please make an Appointment to return in 6 months, or sooner if needed

## 2022-06-20 ENCOUNTER — Encounter: Payer: Self-pay | Admitting: Internal Medicine

## 2022-06-20 LAB — URINE CULTURE: Result:: NO GROWTH

## 2022-06-20 LAB — VALPROIC ACID LEVEL: Valproic Acid Lvl: 74.6 mg/L (ref 50.0–100.0)

## 2022-06-20 NOTE — Assessment & Plan Note (Signed)
BP Readings from Last 3 Encounters:  06/19/22 116/64  05/16/22 126/70  04/10/22 137/77   Stable, pt to continue medical treatment  - diet, wt control

## 2022-06-20 NOTE — Assessment & Plan Note (Signed)
Age and sex appropriate education and counseling updated with regular exercise and diet Referrals for preventative services  - declines pulm referral for LDCT screening Immunizations addressed - declines covid booster Smoking counseling  - none needed Evidence for depression or other mood disorder - stable bipolar  Most recent labs reviewed. I have personally reviewed and have noted: 1) the patient's medical and social history 2) The patient's current medications and supplements 3) The patient's height, weight, and BMI have been recorded in the chart

## 2022-06-20 NOTE — Assessment & Plan Note (Signed)
Stable, to continue inhaler prn

## 2022-06-20 NOTE — Assessment & Plan Note (Signed)
Recent onset with urgency and small incontinence also new, likely UTI - for empiric cipro, and urine cultrue

## 2022-06-20 NOTE — Assessment & Plan Note (Signed)
Overall stable it seems, for depakote level, f/u psychiatry as planned

## 2022-06-20 NOTE — Assessment & Plan Note (Signed)
Lab Results  Component Value Date   CREATININE 1.42 (H) 06/19/2022   Stable overall, cont to avoid nephrotoxins

## 2022-06-20 NOTE — Assessment & Plan Note (Signed)
Lab Results  Component Value Date   HGBA1C 6.1 06/19/2022   Stable, pt to continue current medical treatment  - diet, wt control

## 2022-08-10 ENCOUNTER — Encounter: Payer: Self-pay | Admitting: Internal Medicine

## 2022-08-10 ENCOUNTER — Ambulatory Visit: Payer: Medicare Other | Admitting: Internal Medicine

## 2022-08-10 VITALS — BP 112/74 | HR 90 | Ht 64.0 in | Wt 184.0 lb

## 2022-08-10 DIAGNOSIS — E0590 Thyrotoxicosis, unspecified without thyrotoxic crisis or storm: Secondary | ICD-10-CM

## 2022-08-10 LAB — T4, FREE: Free T4: 0.72 ng/dL (ref 0.60–1.60)

## 2022-08-10 LAB — TSH: TSH: 0.46 u[IU]/mL (ref 0.35–5.50)

## 2022-08-10 NOTE — Progress Notes (Signed)
Name: Paula [name]  MRN/ DOB: [id number], 25-Dec-1945    Age/ Sex: 77 y.o., female    PCP: Biagio Borg, MD   Reason for Endocrinology Evaluation: Low TSH      Date of Initial Endocrinology Evaluation: 08/10/2022     HPI: Paula [name] is a 77 y.o. female with a past medical history of Dyslipidemia. The patient presented for initial endocrinology clinic visit on 08/10/2022 for consultative assistance with her low TSH .   Pt has been noted with intermittent low TSH  in 2023 with a nadir of 0.17 uIU/mL in 06/2022, no concomitant  FT4 or T3.   No amiodarone use  Pt with hx of osteopenia  No cardiac arrhythmia  No Biotin   Weight has been fluctuating  Denies local neck swelling  Denies palpitations  Denies tremors or diarrhea  Has occasional itching of the eyes that she attributes to allergies  Denies jittery sensation nor anxiety   No Fh of thyroid disease    HISTORY:  Past Medical History:  Past Medical History:  Diagnosis Date   Allergy    Arthritis    Asthma    Bipolar 1 disorder ([place])    Colon polyps    Depression    Diabetes mellitus without complication (HCC)    GERD (gastroesophageal reflux disease)    History of alcohol abuse    History of chicken pox    History of drug abuse ([place])    Hyperlipidemia    Hypertension    Urine incontinence    Past Surgical History:  Past Surgical History:  Procedure Laterality Date   APPENDECTOMY     BREAST BIOPSY     CHOLECYSTECTOMY     LUNG BIOPSY     SUBTOTAL COLECTOMY      Social History:  reports that she quit smoking about 6 years ago. Her smoking use included cigarettes. She has a 55.00 pack-year smoking history. She has never used smokeless tobacco. She reports current alcohol use of about 4.0 - 6.0 standard drinks of alcohol per week. She reports current drug use. Frequency: 7.00 times per week. Drug: Marijuana. Family History: family history includes Arthritis in her father; COPD in her mother; Diabetes  in her father; Heart disease in her father, paternal grandmother, and paternal uncle; Hyperlipidemia in her mother.   HOME MEDICATIONS: Allergies as of 08/10/2022       Reactions   Penicillins         Medication List        Accurate as of August 10, 2022 12:36 PM. If you have any questions, ask your nurse or doctor.          STOP taking these medications    nitrofurantoin (macrocrystal-monohydrate) 100 MG capsule Commonly known as: Macrobid Stopped by: Dorita Sciara, MD       TAKE these medications    aspirin EC 81 MG tablet Take 81 mg by mouth daily.   divalproex 250 MG 24 hr tablet Commonly known as: DEPAKOTE ER Take 3 tablets (750 mg total) by mouth every morning.   ezetimibe 10 MG tablet Commonly known as: ZETIA Take 1 tablet (10 mg total) by mouth daily.   famotidine 20 MG tablet Commonly known as: PEPCID TAKE 1 TABLET BY MOUTH TWICE A DAY   Fish Oil 1000 MG Caps Take 2 capsules by mouth daily.   FLUoxetine 10 MG capsule Commonly known as: PROZAC Take 10 mg by mouth every morning.  FREESTYLE LITE test strip Generic drug: glucose blood USE ASD TO CHECK BLOOD SUGARS 3 TIMES A DAY E11.9 What changed:  when to take this additional instructions   Ingrezza 40 MG capsule Generic drug: valbenazine Take 40 mg by mouth daily.   mirabegron ER 50 MG Tb24 tablet Commonly known as: MYRBETRIQ Take 1 tablet (50 mg total) by mouth daily.   multivitamin-iron-minerals-folic acid chewable tablet Chew 1 tablet by mouth daily.   pravastatin 40 MG tablet Commonly known as: PRAVACHOL Take 1 tablet (40 mg total) by mouth daily.   QUEtiapine 50 MG tablet Commonly known as: SEROQUEL Take 1 tablet (50 mg total) by mouth 2 (two) times daily.   triamcinolone cream 0.1 % Commonly known as: KENALOG APPLY TO AFFECTED AREA TWICE A DAY   Vitamin D3 50 MCG (2000 UT) Tabs Generic drug: Cholecalciferol Take by mouth.   zolpidem 5 MG tablet Commonly known  as: AMBIEN Take 5 mg by mouth at bedtime as needed for sleep. What changed: Another medication with the same name was removed. Continue taking this medication, and follow the directions you see here. Changed by: Dorita Sciara, MD          REVIEW OF SYSTEMS: A comprehensive ROS was conducted with the patient and is negative except as per HPI   OBJECTIVE:  VS: BP 112/74 (BP Location: Left Arm, Patient Position: Sitting, Cuff Size: Small)   Pulse 90   Ht '5\' 4"'$  (1.626 m)   Wt 184 lb (83.5 kg)   SpO2 95%   BMI 31.58 kg/m    Wt Readings from Last 3 Encounters:  08/10/22 184 lb (83.5 kg)  06/19/22 184 lb (83.5 kg)  05/16/22 188 lb (85.3 kg)     EXAM: General: Pt appears well and is in NAD  Eyes: External eye exam normal without stare, lid lag or exophthalmos.  EOM intact.   Neck: General: Supple without adenopathy. Thyroid: Thyroid size normal.  No goiter or nodules appreciated. No thyroid bruit.  Lungs: Clear with good BS bilat with no rales, rhonchi, or wheezes  Heart: Auscultation: RRR. + systolic murmur  Extremities:  BL LE: No pretibial edema normal ROM and strength.  Mental Status: Judgment, insight: Intact Orientation: Oriented to time, place, and person Mood and affect: No depression, anxiety, or agitation     DATA REVIEWED:     Latest Reference Range & Units 06/19/22 11:14 08/10/22 12:47  TSH 0.35 - 5.50 uIU/mL 0.17 (L) 0.46  Triiodothyronine (T3) 76 - 181 ng/dL  75 (L)  T4,Free(Direct) 0.60 - 1.60 ng/dL  0.72  (L): Data is abnormally low    Latest Reference Range & Units 06/19/22 11:14  Sodium 135 - 145 mEq/L 137  Potassium 3.5 - 5.1 mEq/L 4.3  Chloride 96 - 112 mEq/L 103  CO2 19 - 32 mEq/L 27  Glucose 70 - 99 mg/dL 104 (H)  BUN 6 - 23 mg/dL 29 (H)  Creatinine 0.40 - 1.20 mg/dL 1.42 (H)  Calcium 8.4 - 10.5 mg/dL 9.6  Alkaline Phosphatase 39 - 117 U/L 65  Albumin 3.5 - 5.2 g/dL 3.9  AST 0 - 37 U/L 20  ALT 0 - 35 U/L 15  Total Protein 6.0 -  8.3 g/dL 7.1  Bilirubin, Direct 0.0 - 0.3 mg/dL 0.1  Total Bilirubin 0.2 - 1.2 mg/dL 0.3  GFR >60.00 mL/min 35.77 (L)    ASSESSMENT/PLAN/RECOMMENDATIONS:   Subclinical Hyperthyroidism :  -Patient is clinically euthyroid -No local neck symptoms -We did discuss differential  diagnosis to include Graves' disease, autonomous thyroid nodule, versus subacute thyroiditis -We did discuss the effects of uncontrolled hyperthyroidism on increasing the risk of cardiac arrhythmia as well as increased bone resorption -We also briefly discussed dynamite therapy as well as side effects and benefits -Her TFTs today show normal TSH and free T4 but her total T3 is low I am not sure if this is due to subacute thyroiditis and she is recovering versus a low thyroxine binding globulin -We will repeat on the next visit but at this time no indication for treatment   Signed electronically by: Mack Guise, MD  [facility]  [address] [place], [place] [address] Phone: [phone] FAX: [phone]   CC: Biagio Borg, [place] [address] Phone: [phone] Fax: [phone]   Return to Endocrinology clinic as below: Future Appointments  Date Time Provider [place]  09/08/2022  3:00 PM Biagio Borg, MD [facility] None  12/18/2022 10:20 AM Biagio Borg, MD [facility] None

## 2022-08-13 LAB — T3: T3, Total: 75 ng/dL — ABNORMAL LOW (ref 76–181)

## 2022-08-13 LAB — TRAB (TSH RECEPTOR BINDING ANTIBODY): TRAB: <1 IU/L (ref <=2.00)

## 2022-08-15 IMAGING — MG DIGITAL SCREENING BILAT W/ TOMO W/ CAD
6 of 10 series · 6 of 30 positions shown · non-contrast
Comparison: Previous exam(s).

CLINICAL DATA: Screening.

EXAM:
DIGITAL SCREENING BILATERAL MAMMOGRAM WITH TOMO AND CAD

[R CC synth-2D]
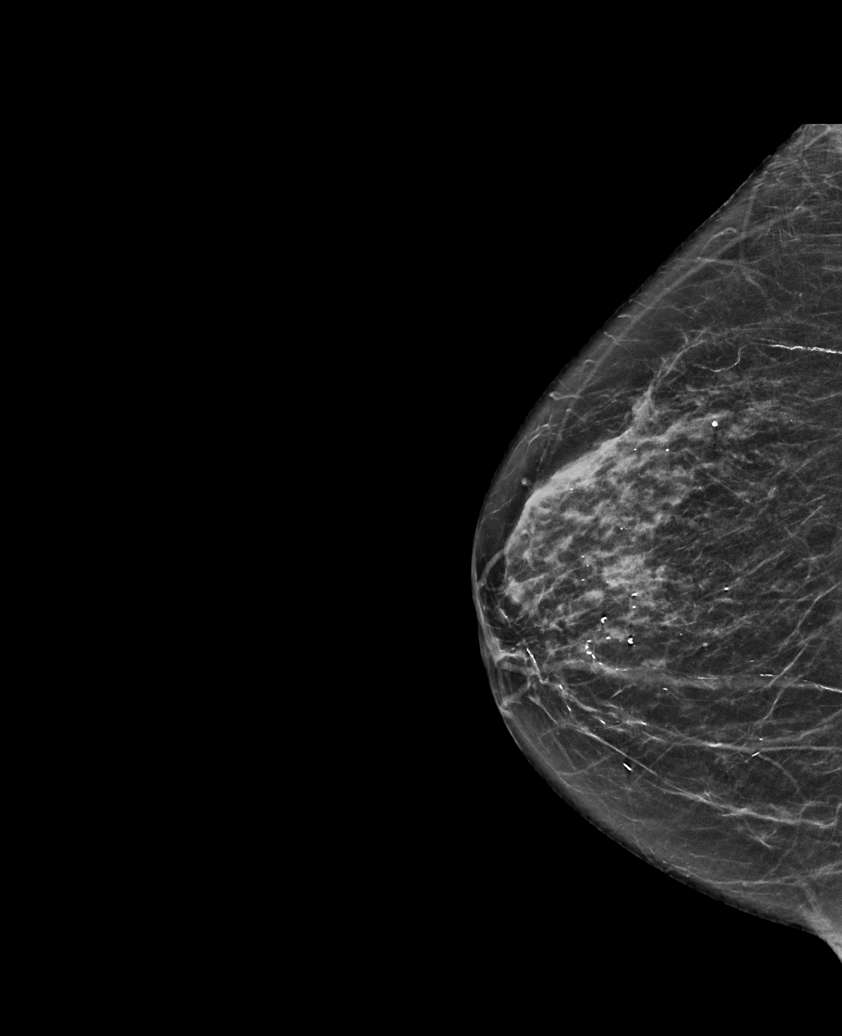

[L CC synth-2D]
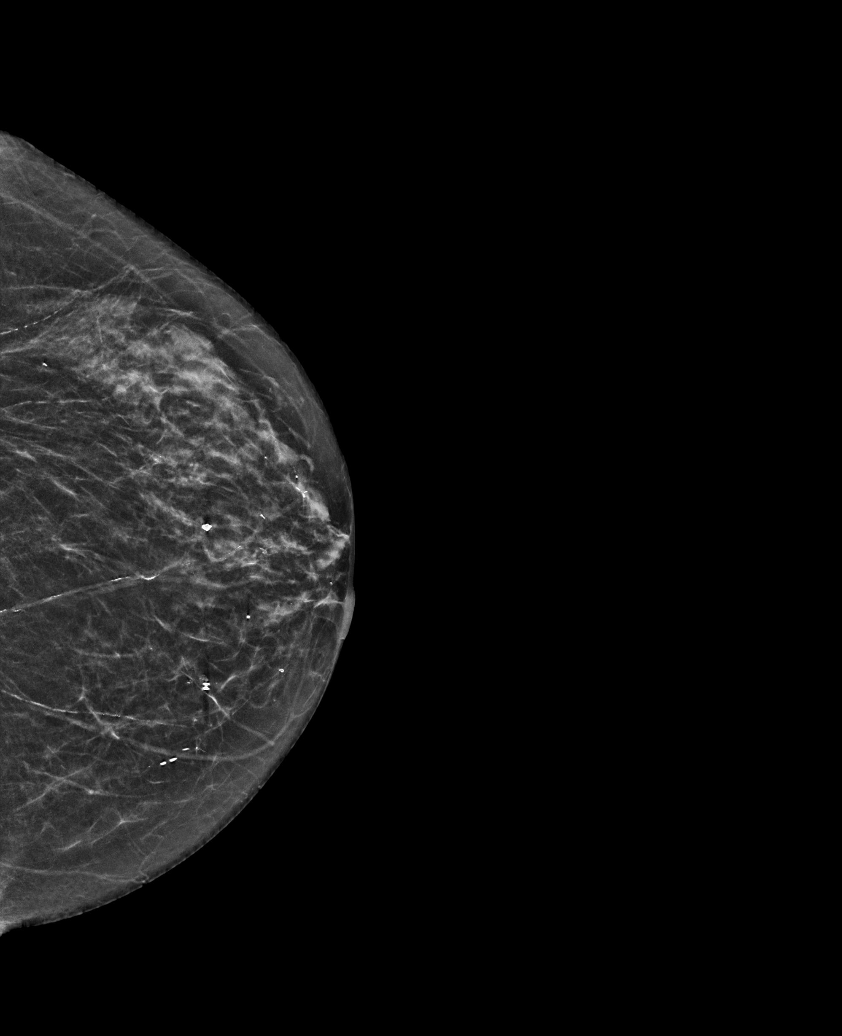

[R MLO synth-2D (1 of 2)]
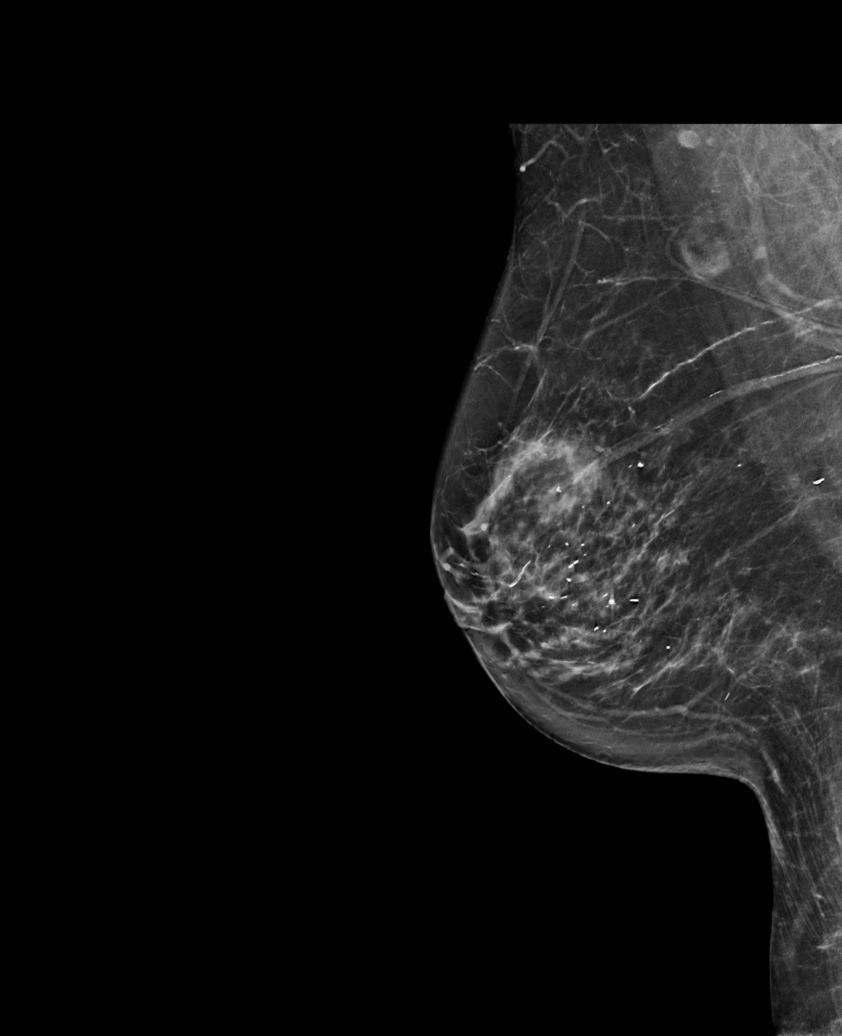

[L MLO synth-2D]
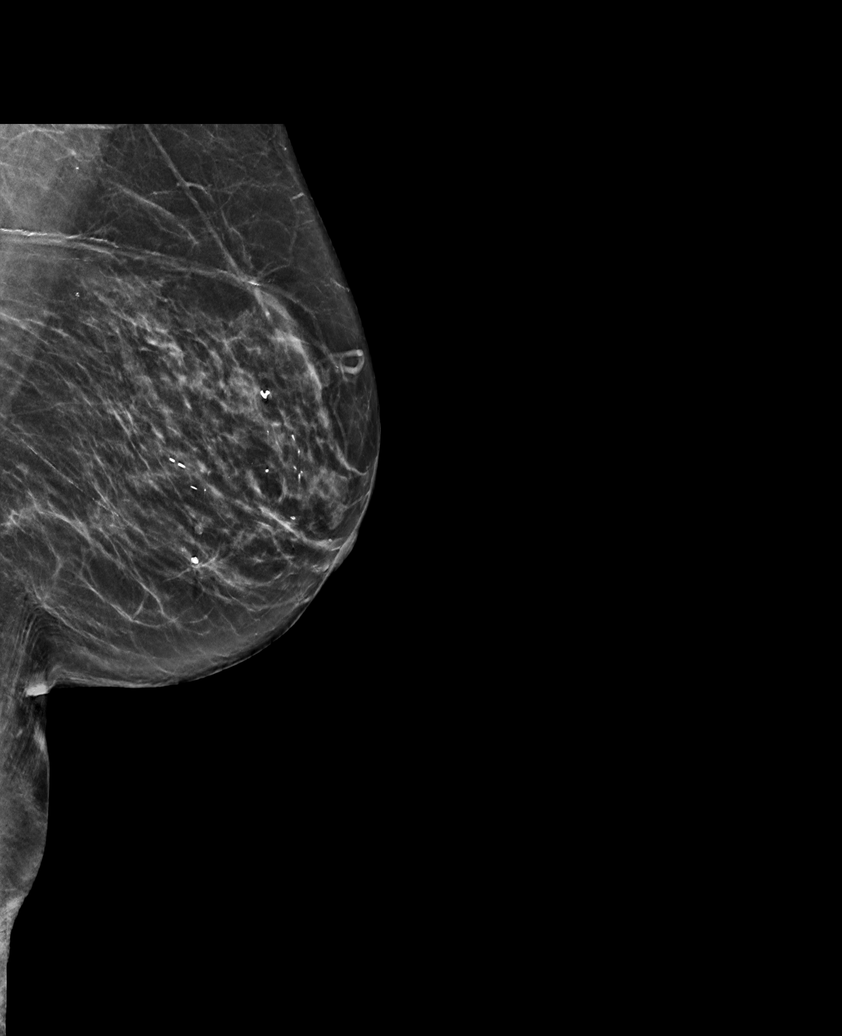

[R MLO synth-2D (2 of 2)]
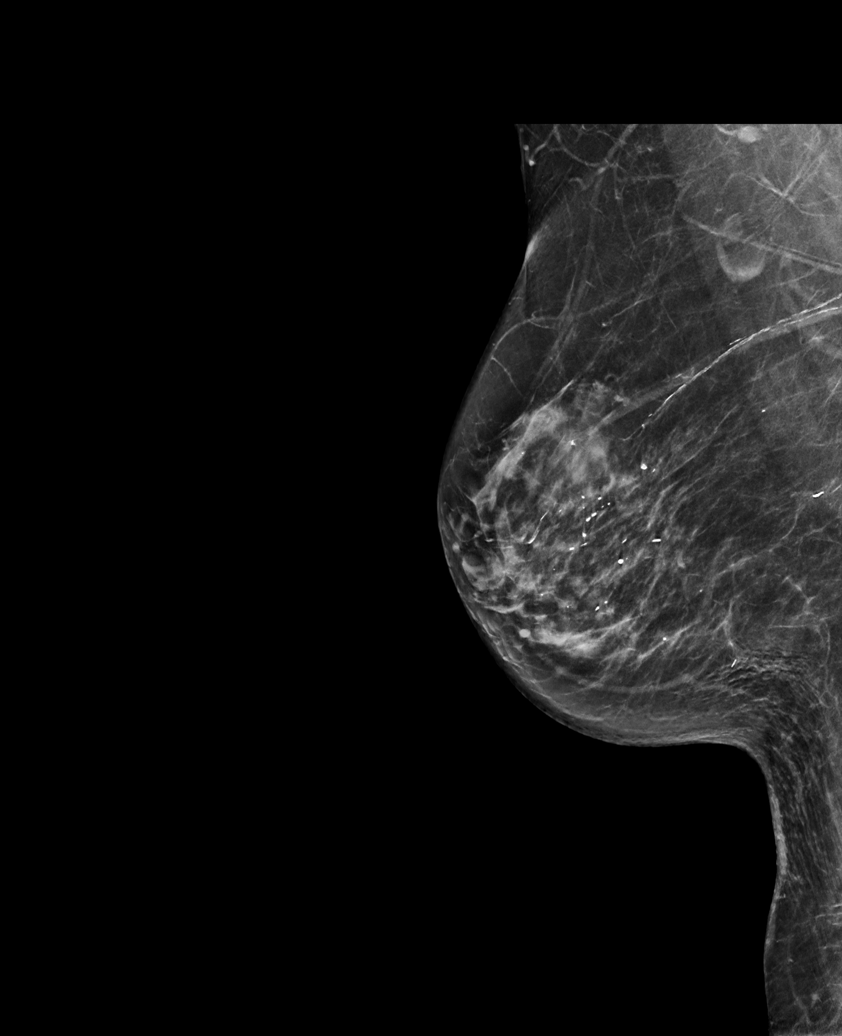

[R MLO tomo · tomo slice 38/75.0]
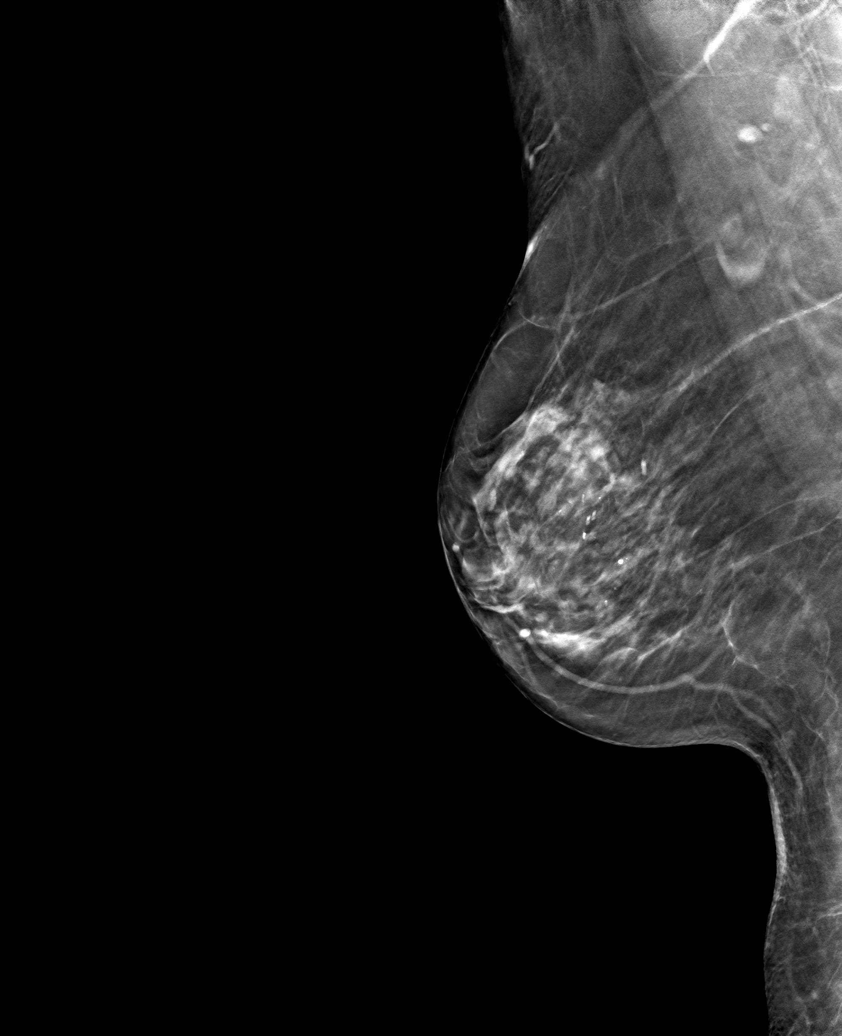

[6 of 30 positions shown; findings below may reference images not displayed]

ACR Breast Density Category c: The breast tissue is heterogeneously
dense, which may obscure small masses.
FINDINGS: There are no findings suspicious for malignancy. Images were
processed with CAD.
IMPRESSION: No mammographic evidence of malignancy. A result letter of this
screening mammogram will be mailed directly to the patient.

RECOMMENDATION:
Screening mammogram in one year. (Code:[id number])

BI-RADS CATEGORY  1: Negative.

## 2022-09-08 ENCOUNTER — Ambulatory Visit: Payer: Medicare Other | Admitting: Internal Medicine

## 2022-09-08 ENCOUNTER — Ambulatory Visit (INDEPENDENT_AMBULATORY_CARE_PROVIDER_SITE_OTHER): Payer: Medicare Other | Admitting: Internal Medicine

## 2022-09-08 ENCOUNTER — Encounter: Payer: Self-pay | Admitting: Internal Medicine

## 2022-09-08 VITALS — BP 118/66 | HR 76 | Temp 97.8°F | Ht 64.0 in | Wt 183.0 lb

## 2022-09-08 DIAGNOSIS — N760 Acute vaginitis: Secondary | ICD-10-CM | POA: Insufficient documentation

## 2022-09-08 DIAGNOSIS — R7303 Prediabetes: Secondary | ICD-10-CM | POA: Diagnosis not present

## 2022-09-08 DIAGNOSIS — J449 Chronic obstructive pulmonary disease, unspecified: Secondary | ICD-10-CM | POA: Diagnosis not present

## 2022-09-08 DIAGNOSIS — R350 Frequency of micturition: Secondary | ICD-10-CM

## 2022-09-08 LAB — URINALYSIS, ROUTINE W REFLEX MICROSCOPIC
Bilirubin Urine: NEGATIVE
Hgb urine dipstick: NEGATIVE
Ketones, ur: NEGATIVE
Nitrite: NEGATIVE
Specific Gravity, Urine: 1.01 (ref 1.000–1.030)
Total Protein, Urine: NEGATIVE
Urine Glucose: NEGATIVE
Urobilinogen, UA: 0.2 (ref 0.0–1.0)
pH: 6.5 (ref 5.0–8.0)

## 2022-09-08 MED ORDER — TRIMETHOPRIM 100 MG PO TABS: 100.0000 mg | ORAL_TABLET | Freq: Every day | ORAL | 3 refills | Status: DC

## 2022-09-08 MED ORDER — CIPROFLOXACIN HCL 500 MG PO TABS: 500.0000 mg | ORAL_TABLET | Freq: Two times a day (BID) | ORAL | 0 refills | Status: AC

## 2022-09-08 MED ORDER — TERCONAZOLE 80 MG VA SUPP: 80.0000 mg | Freq: Every day | VAGINAL | 0 refills | Status: DC

## 2022-09-08 NOTE — Patient Instructions (Signed)
Please take all new medication as prescribed - the cipro course  Please take all new medication as prescribed - the Trimethoprim once daily AFTER the cipro is done  Please take all new medication as prescribed - the suppository for possible fungal irritation  Please continue all other medications as before, and refills have been done if requested.  Please have the pharmacy call with any other refills you may need.  Please keep your appointments with your specialists as you may have planned  Please go to the LAB at the blood drawing area for the tests to be done - just the urine testing today  You will be contacted by phone if any changes need to be made immediately.  Otherwise, you will receive a letter about your results with an explanation, but please check with MyChart first.  Please remember to sign up for MyChart if you have not done so, as this will be important to you in the future with finding out test results, communicating by private email, and scheduling acute appointments online when needed.

## 2022-09-08 NOTE — Assessment & Plan Note (Signed)
Can't r/o recurrent uti - for urine cx, empiric cipro course,  to f/u any worsening symptoms or concerns

## 2022-09-08 NOTE — Assessment & Plan Note (Signed)
Lab Results  Component Value Date   HGBA1C 6.1 06/19/2022   Stable, pt to continue current medical treatment  - diet, wt control  

## 2022-09-08 NOTE — Assessment & Plan Note (Signed)
Ok for empiric tx terazol supp, consider GYN f/u for persistent symptoms

## 2022-09-08 NOTE — Progress Notes (Signed)
Patient ID: Paula [name], female   DOB: 1945/10/05, 77 y.o.   MRN: [id number]        Chief Complaint: follow up dysuria, vaginitis, prediabetes, copd       HPI:  Paula [name] is a 77 y.o. female here with c/o dysuria x 3 days and lower abd discomfort with nausea, similar to start of UTI tx in jan 2024  Denies urinary symptoms such as frequency, urgency, flank pain, hematuria or fever, chills. Pt denies chest pain, increased sob or doe, wheezing, orthopnea, PND, increased LE swelling, palpitations, dizziness or syncope.   Pt denies polydipsia, polyuria, or new focal neuro s/s.    Pt denies fever, wt loss, night sweats, loss of appetite, or other constitutional symptoms  Does have marked vaginal itching as well with swelling for 1 wk .  Good compliance with meds including inhaler.         Wt Readings from Last 3 Encounters:  09/08/22 183 lb (83 kg)  08/10/22 184 lb (83.5 kg)  06/19/22 184 lb (83.5 kg)   BP Readings from Last 3 Encounters:  09/08/22 118/66  08/10/22 112/74  06/19/22 116/64         Past Medical History:  Diagnosis Date   Allergy    Arthritis    Asthma    Bipolar 1 disorder    Colon polyps    Depression    Diabetes mellitus without complication    GERD (gastroesophageal reflux disease)    History of alcohol abuse    History of chicken pox    History of drug abuse    Hyperlipidemia    Hypertension    Urine incontinence    Past Surgical History:  Procedure Laterality Date   APPENDECTOMY     BREAST BIOPSY     CHOLECYSTECTOMY     LUNG BIOPSY     SUBTOTAL COLECTOMY      reports that she quit smoking about 6 years ago. Her smoking use included cigarettes. She has a 55.00 pack-year smoking history. She has never used smokeless tobacco. She reports current alcohol use of about 4.0 - 6.0 standard drinks of alcohol per week. She reports current drug use. Frequency: 7.00 times per week. Drug: Marijuana. family history includes Arthritis in her father; COPD in her  mother; Diabetes in her father; Heart disease in her father, paternal grandmother, and paternal uncle; Hyperlipidemia in her mother. Allergies  Allergen Reactions   Penicillins    Current Outpatient Medications on File Prior to Visit  Medication Sig Dispense Refill   aspirin EC 81 MG tablet Take 81 mg by mouth daily.     Cholecalciferol (VITAMIN D3) 2000 units TABS Take by mouth.     divalproex (DEPAKOTE ER) 250 MG 24 hr tablet Take 3 tablets (750 mg total) by mouth every morning. 30 tablet 0   ezetimibe (ZETIA) 10 MG tablet Take 1 tablet (10 mg total) by mouth daily. 90 tablet 3   famotidine (PEPCID) 20 MG tablet TAKE 1 TABLET BY MOUTH TWICE A DAY 180 tablet 3   FLUoxetine (PROZAC) 10 MG capsule Take 10 mg by mouth every morning.  2   glucose blood (FREESTYLE LITE) test strip USE ASD TO CHECK BLOOD SUGARS 3 TIMES A DAY E11.9 (Patient taking differently: daily. USE ASD TO CHECK BLOOD SUGARS 1 TIME A DAY E11.9) 300 strip 3   mirabegron ER (MYRBETRIQ) 50 MG TB24 tablet Take 1 tablet (50 mg total) by mouth daily. 90 tablet 3  multivitamin-iron-minerals-folic acid (CENTRUM) chewable tablet Chew 1 tablet by mouth daily.     Omega-3 Fatty Acids (FISH OIL) 1000 MG CAPS Take 2 capsules by mouth daily.     pravastatin (PRAVACHOL) 40 MG tablet Take 1 tablet (40 mg total) by mouth daily. 90 tablet 3   QUEtiapine (SEROQUEL) 25 MG tablet Take 25 mg by mouth at bedtime.     triamcinolone cream (KENALOG) 0.1 % APPLY TO AFFECTED AREA TWICE A DAY 30 g 1   valbenazine (INGREZZA) 40 MG capsule Take 40 mg by mouth daily.     zolpidem (AMBIEN) 5 MG tablet Take 5 mg by mouth at bedtime as needed for sleep.     No current facility-administered medications on file prior to visit.        ROS:  All others reviewed and negative.  Objective        PE:  BP 118/66   Pulse 76   Temp 97.8 F (36.6 C) (Oral)   Ht 5\' 4"  (1.626 m)   Wt 183 lb (83 kg)   SpO2 95%   BMI 31.41 kg/m                 Constitutional:  Pt appears in NAD               HENT: Head: NCAT.                Right Ear: External ear normal.                 Left Ear: External ear normal.                Eyes: . Pupils are equal, round, and reactive to light. Conjunctivae and EOM are normal               Nose: without d/c or deformity               Neck: Neck supple. Gross normal ROM               Cardiovascular: Normal rate and regular rhythm.                 Pulmonary/Chest: Effort normal and breath sounds without rales or wheezing.                Abd:  Soft,mild mid low abd tender,,ND, + BS, no organomegaly               Neurological: Pt is alert. At baseline orientation, motor grossly intact               Skin: Skin is warm. No rashes, no other new lesions, LE edema - none               Psychiatric: Pt behavior is normal without agitation   Micro: none  Cardiac tracings I have personally interpreted today:  none  Pertinent Radiological findings (summarize): none   Lab Results  Component Value Date   WBC 6.2 06/19/2022   HGB 13.5 06/19/2022   HCT 39.9 06/19/2022   PLT 151.0 06/19/2022   GLUCOSE 104 (H) 06/19/2022   CHOL 149 06/19/2022   TRIG 96.0 06/19/2022   HDL 60.20 06/19/2022   LDLDIRECT 73.0 06/16/2021   LDLCALC 70 06/19/2022   ALT 15 06/19/2022   AST 20 06/19/2022   NA 137 06/19/2022   K 4.3 06/19/2022   CL 103 06/19/2022   CREATININE 1.42 (H) 06/19/2022   BUN 29 (H)  06/19/2022   CO2 27 06/19/2022   TSH 0.46 08/10/2022   HGBA1C 6.1 06/19/2022   MICROALBUR 0.9 12/14/2020   Assessment/Plan:  Paula [name] is a 77 y.o. White or Caucasian [1] female with  has a past medical history of Allergy, Arthritis, Asthma, Bipolar 1 disorder, Colon polyps, Depression, Diabetes mellitus without complication, GERD (gastroesophageal reflux disease), History of alcohol abuse, History of chicken pox, History of drug abuse, Hyperlipidemia, Hypertension, and Urine incontinence.  COPD  GOLD 0  Stable, continue albut inhaler  prn  Pre-diabetes Lab Results  Component Value Date   HGBA1C 6.1 06/19/2022   Stable, pt to continue current medical treatment - diet, wt control   Urinary frequency Can't r/o recurrent uti - for urine cx, empiric cipro course,  to f/u any worsening symptoms or concerns  Vaginitis Ok for empiric tx terazol supp, consider GYN f/u for persistent symptoms  Followup: Return if symptoms worsen or fail to improve.  Oliver Barre, MD 09/08/2022 1:03 PM [facility]

## 2022-09-08 NOTE — Assessment & Plan Note (Signed)
Stable, continue albut inhaler prn

## 2022-09-09 LAB — URINE CULTURE: Result:: NO GROWTH

## 2022-09-10 ENCOUNTER — Other Ambulatory Visit: Payer: Self-pay | Admitting: Internal Medicine

## 2022-10-18 ENCOUNTER — Other Ambulatory Visit: Payer: Medicare Other

## 2022-10-24 ENCOUNTER — Other Ambulatory Visit: Payer: Medicare Other

## 2022-11-01 ENCOUNTER — Other Ambulatory Visit (INDEPENDENT_AMBULATORY_CARE_PROVIDER_SITE_OTHER): Payer: Medicare Other

## 2022-11-01 DIAGNOSIS — E0590 Thyrotoxicosis, unspecified without thyrotoxic crisis or storm: Secondary | ICD-10-CM | POA: Diagnosis not present

## 2022-11-01 LAB — T4, FREE: Free T4: 0.8 ng/dL (ref 0.60–1.60)

## 2022-12-18 ENCOUNTER — Ambulatory Visit (INDEPENDENT_AMBULATORY_CARE_PROVIDER_SITE_OTHER): Payer: Medicare Other | Admitting: Internal Medicine

## 2022-12-18 VITALS — BP 116/68 | HR 70 | Temp 98.0°F | Ht 64.0 in | Wt 178.0 lb

## 2022-12-18 DIAGNOSIS — I10 Essential (primary) hypertension: Secondary | ICD-10-CM | POA: Diagnosis not present

## 2022-12-18 DIAGNOSIS — R7303 Prediabetes: Secondary | ICD-10-CM | POA: Diagnosis not present

## 2022-12-18 DIAGNOSIS — E538 Deficiency of other specified B group vitamins: Secondary | ICD-10-CM | POA: Diagnosis not present

## 2022-12-18 DIAGNOSIS — K5900 Constipation, unspecified: Secondary | ICD-10-CM

## 2022-12-18 DIAGNOSIS — N1832 Chronic kidney disease, stage 3b: Secondary | ICD-10-CM | POA: Diagnosis not present

## 2022-12-18 DIAGNOSIS — E559 Vitamin D deficiency, unspecified: Secondary | ICD-10-CM | POA: Diagnosis not present

## 2022-12-18 DIAGNOSIS — E782 Mixed hyperlipidemia: Secondary | ICD-10-CM | POA: Diagnosis not present

## 2022-12-18 DIAGNOSIS — N3281 Overactive bladder: Secondary | ICD-10-CM

## 2022-12-18 DIAGNOSIS — G4700 Insomnia, unspecified: Secondary | ICD-10-CM | POA: Diagnosis not present

## 2022-12-18 LAB — HEPATIC FUNCTION PANEL
ALT: 21 U/L (ref 0–35)
AST: 23 U/L (ref 0–37)
Albumin: 4.2 g/dL (ref 3.5–5.2)
Alkaline Phosphatase: 64 U/L (ref 39–117)
Bilirubin, Direct: 0.1 mg/dL (ref 0.0–0.3)
Total Bilirubin: 0.4 mg/dL (ref 0.2–1.2)
Total Protein: 7.4 g/dL (ref 6.0–8.3)

## 2022-12-18 LAB — CBC WITH DIFFERENTIAL/PLATELET
Basophils Absolute: 0 10*3/uL (ref 0.0–0.1)
Basophils Relative: 0.4 % (ref 0.0–3.0)
Eosinophils Absolute: 0.3 10*3/uL (ref 0.0–0.7)
Eosinophils Relative: 4.4 % (ref 0.0–5.0)
HCT: 43.3 % (ref 36.0–46.0)
Hemoglobin: 14.1 g/dL (ref 12.0–15.0)
Lymphocytes Relative: 33.4 % (ref 12.0–46.0)
Lymphs Abs: 2.2 10*3/uL (ref 0.7–4.0)
MCHC: 32.6 g/dL (ref 30.0–36.0)
MCV: 101.4 fl — ABNORMAL HIGH (ref 78.0–100.0)
Monocytes Absolute: 0.6 10*3/uL (ref 0.1–1.0)
Monocytes Relative: 9.3 % (ref 3.0–12.0)
Neutro Abs: 3.4 10*3/uL (ref 1.4–7.7)
Neutrophils Relative %: 52.5 % (ref 43.0–77.0)
Platelets: 225 10*3/uL (ref 150.0–400.0)
RBC: 4.27 Mil/uL (ref 3.87–5.11)
RDW: 12.6 % (ref 11.5–15.5)
WBC: 6.5 10*3/uL (ref 4.0–10.5)

## 2022-12-18 LAB — HEMOGLOBIN A1C: Hgb A1c MFr Bld: 5.8 % (ref 4.6–6.5)

## 2022-12-18 LAB — BASIC METABOLIC PANEL
BUN: 23 mg/dL (ref 6–23)
CO2: 26 mEq/L (ref 19–32)
Calcium: 10.7 mg/dL — ABNORMAL HIGH (ref 8.4–10.5)
Chloride: 105 mEq/L (ref 96–112)
Creatinine, Ser: 1.59 mg/dL — ABNORMAL HIGH (ref 0.40–1.20)
GFR: 31.12 mL/min — ABNORMAL LOW (ref >60.00)
Glucose, Bld: 99 mg/dL (ref 70–99)
Potassium: 5.1 mEq/L (ref 3.5–5.1)
Sodium: 139 mEq/L (ref 135–145)

## 2022-12-18 LAB — LIPID PANEL
Cholesterol: 166 mg/dL (ref 0–200)
HDL: 63.6 mg/dL (ref >39.00)
LDL Cholesterol: 80 mg/dL (ref 0–99)
NonHDL: 102.42
Total CHOL/HDL Ratio: 3
Triglycerides: 112 mg/dL (ref 0.0–149.0)
VLDL: 22.4 mg/dL (ref 0.0–40.0)

## 2022-12-18 LAB — VITAMIN D 25 HYDROXY (VIT D DEFICIENCY, FRACTURES): VITD: 71.85 ng/mL (ref 30.00–100.00)

## 2022-12-18 LAB — VITAMIN B12: Vitamin B-12: 669 pg/mL (ref 211–911)

## 2022-12-18 MED ORDER — ZOLPIDEM TARTRATE 5 MG PO TABS: 5.0000 mg | ORAL_TABLET | Freq: Every evening | ORAL | 5 refills | Status: AC | PRN

## 2022-12-18 MED ORDER — LINACLOTIDE 145 MCG PO CAPS: 145.0000 ug | ORAL_CAPSULE | Freq: Every day | ORAL | 11 refills | Status: DC | PRN

## 2022-12-18 NOTE — Progress Notes (Unsigned)
Patient ID: Paula [name], female   DOB: 03-10-1946, 77 y.o.   MRN: [id number]        Chief Complaint: follow up constipation, OAB, ckd3a, insomnia, hld, preDM, htn       HPI:  Paula [name] is a 77 y.o. female here with c/o worsening constipation mild to mod not well controlled with dialy miralax and intermittent stool softner use.  Denies worsening reflux, abd pain, dysphagia, n/v, or blood.  Pt denies chest pain, increased sob or doe, wheezing, orthopnea, PND, increased LE swelling, palpitations, dizziness or syncope.   Pt denies polydipsia, polyuria, or new focal neuro s/s.   Urinary symptoms much improved with OAB treatment.  Denies worsening depressive symptoms, suicidal ideation, or panic; but does have persistent insomnia, has done well with ambien in the past.         Wt Readings from Last 3 Encounters:  12/18/22 178 lb (80.7 kg)  09/08/22 183 lb (83 kg)  08/10/22 184 lb (83.5 kg)   BP Readings from Last 3 Encounters:  12/18/22 116/68  09/08/22 118/66  08/10/22 112/74         Past Medical History:  Diagnosis Date   Allergy    Arthritis    Asthma    Bipolar 1 disorder (HCC)    Colon polyps    Depression    Diabetes mellitus without complication (HCC)    GERD (gastroesophageal reflux disease)    History of alcohol abuse    History of chicken pox    History of drug abuse (HCC)    Hyperlipidemia    Hypertension    Urine incontinence    Past Surgical History:  Procedure Laterality Date   APPENDECTOMY     BREAST BIOPSY     CHOLECYSTECTOMY     LUNG BIOPSY     SUBTOTAL COLECTOMY      reports that she quit smoking about 6 years ago. Her smoking use included cigarettes. She started smoking about 61 years ago. She has a 55 pack-year smoking history. She has never used smokeless tobacco. She reports current alcohol use of about 4.0 - 6.0 standard drinks of alcohol per week. She reports current drug use. Frequency: 7.00 times per week. Drug: Marijuana. family history  includes Arthritis in her father; COPD in her mother; Diabetes in her father; Heart disease in her father, paternal grandmother, and paternal uncle; Hyperlipidemia in her mother. Allergies  Allergen Reactions   Penicillins    Current Outpatient Medications on File Prior to Visit  Medication Sig Dispense Refill   aspirin EC 81 MG tablet Take 81 mg by mouth daily.     Cholecalciferol (VITAMIN D3) 2000 units TABS Take by mouth.     divalproex (DEPAKOTE ER) 250 MG 24 hr tablet Take 3 tablets (750 mg total) by mouth every morning. 30 tablet 0   ezetimibe (ZETIA) 10 MG tablet Take 1 tablet (10 mg total) by mouth daily. 90 tablet 3   famotidine (PEPCID) 20 MG tablet TAKE 1 TABLET BY MOUTH TWICE A DAY 180 tablet 3   FLUoxetine (PROZAC) 10 MG capsule Take 10 mg by mouth every morning.  2   glucose blood (FREESTYLE LITE) test strip USE ASD TO CHECK BLOOD SUGARS 3 TIMES A DAY E11.9 (Patient taking differently: daily. USE ASD TO CHECK BLOOD SUGARS 1 TIME A DAY E11.9) 300 strip 3   mirabegron ER (MYRBETRIQ) 50 MG TB24 tablet Take 1 tablet (50 mg total) by mouth daily. 90 tablet 3  multivitamin-iron-minerals-folic acid (CENTRUM) chewable tablet Chew 1 tablet by mouth daily.     Omega-3 Fatty Acids (FISH OIL) 1000 MG CAPS Take 2 capsules by mouth daily.     pravastatin (PRAVACHOL) 40 MG tablet Take 1 tablet (40 mg total) by mouth daily. 90 tablet 3   QUEtiapine (SEROQUEL) 25 MG tablet Take 25 mg by mouth at bedtime.     triamcinolone cream (KENALOG) 0.1 % APPLY TO AFFECTED AREA TWICE A DAY 30 g 1   trimethoprim (TRIMPEX) 100 MG tablet Take 1 tablet (100 mg total) by mouth daily. 90 tablet 3   valbenazine (INGREZZA) 40 MG capsule Take 40 mg by mouth daily.     No current facility-administered medications on file prior to visit.        ROS:  All others reviewed and negative.  Objective        PE:  BP 116/68 (BP Location: Left Arm, Patient Position: Sitting, Cuff Size: Large)   Pulse 70   Temp 98 F  (36.7 C) (Oral)   Ht 5\' 4"  (1.626 m)   Wt 178 lb (80.7 kg)   SpO2 95%   BMI 30.55 kg/m                 Constitutional: Pt appears in NAD               HENT: Head: NCAT.                Right Ear: External ear normal.                 Left Ear: External ear normal.                Eyes: . Pupils are equal, round, and reactive to light. Conjunctivae and EOM are normal               Nose: without d/c or deformity               Neck: Neck supple. Gross normal ROM               Cardiovascular: Normal rate and regular rhythm.                 Pulmonary/Chest: Effort normal and breath sounds without rales or wheezing.                Abd:  Soft, NT, ND, + BS, no organomegaly               Neurological: Pt is alert. At baseline orientation, motor grossly intact               Skin: Skin is warm. No rashes, no other new lesions, LE edema - none               Psychiatric: Pt behavior is normal without agitation   Micro: none  Cardiac tracings I have personally interpreted today:  none  Pertinent Radiological findings (summarize): none   Lab Results  Component Value Date   WBC 6.5 12/18/2022   HGB 14.1 12/18/2022   HCT 43.3 12/18/2022   PLT 225.0 12/18/2022   GLUCOSE 99 12/18/2022   CHOL 166 12/18/2022   TRIG 112.0 12/18/2022   HDL 63.60 12/18/2022   LDLDIRECT 73.0 06/16/2021   LDLCALC 80 12/18/2022   ALT 21 12/18/2022   AST 23 12/18/2022   NA 139 12/18/2022   K 5.1 12/18/2022   CL 105 12/18/2022   CREATININE 1.59 (H)  12/18/2022   BUN 23 12/18/2022   CO2 26 12/18/2022   TSH 0.46 08/10/2022   HGBA1C 5.8 12/18/2022   MICROALBUR 0.9 12/14/2020   Assessment/Plan:  Paula [name] is a 77 y.o. White or Caucasian [1] female with  has a past medical history of Allergy, Arthritis, Asthma, Bipolar 1 disorder (HCC), Colon polyps, Depression, Diabetes mellitus without complication (HCC), GERD (gastroesophageal reflux disease), History of alcohol abuse, History of chicken pox, History of drug  abuse (HCC), Hyperlipidemia, Hypertension, and Urine incontinence.  CKD (chronic kidney disease) stage 3, GFR 30-59 ml/min (HCC) Lab Results  Component Value Date   CREATININE 1.59 (H) 12/18/2022   Stable overall, cont to avoid nephrotoxins   Constipation Uncontrolled, for linzess 145 every day prn  Essential hypertension BP Readings from Last 3 Encounters:  12/18/22 116/68  09/08/22 118/66  08/10/22 112/74   Stable, pt to continue medical treatment  - diet, wt control   Insomnia Mild to mod, for ambien at bedtime prn,  to f/u any worsening symptoms or concerns  Mixed hyperlipidemia Lab Results  Component Value Date   LDLCALC 80 12/18/2022   Stable, pt to continue current statin zetia 10 every day, pravastatin 40 qd   Overactive bladder Mproved, continue myrbetrix  Pre-diabetes Lab Results  Component Value Date   HGBA1C 5.8 12/18/2022   Stable, pt to continue current medical treatment  - diet, wt control  Followup: No follow-ups on file.  Oliver Barre, MD 12/19/2022 9:20 PM [facility]

## 2022-12-18 NOTE — Progress Notes (Signed)
The test results show that your current treatment is OK, as the tests are stable,  Please continue the same plan.  There is no other need for change of treatment or further evaluation based on these results, at this time.  thanks

## 2022-12-18 NOTE — Patient Instructions (Signed)
Please take all new medication as prescribed - the linzess as needed for constipation  Please continue all other medications as before, and refills have been done if requested - ambien  Please have the pharmacy call with any other refills you may need.  Please keep your appointments with your specialists as you may have planned  Please go to the LAB at the blood drawing area for the tests to be done  You will be contacted by phone if any changes need to be made immediately.  Otherwise, you will receive a letter about your results with an explanation, but please check with MyChart first.  Please remember to sign up for MyChart if you have not done so, as this will be important to you in the future with finding out test results, communicating by private email, and scheduling acute appointments online when needed.  Please make an Appointment to return in 6 months, or sooner if needed

## 2022-12-19 ENCOUNTER — Encounter: Payer: Self-pay | Admitting: Internal Medicine

## 2022-12-19 DIAGNOSIS — K5900 Constipation, unspecified: Secondary | ICD-10-CM | POA: Insufficient documentation

## 2022-12-19 DIAGNOSIS — G4700 Insomnia, unspecified: Secondary | ICD-10-CM | POA: Insufficient documentation

## 2022-12-19 NOTE — Assessment & Plan Note (Signed)
Uncontrolled, for linzess 145 every day prn

## 2022-12-19 NOTE — Assessment & Plan Note (Signed)
Mild to mod, for ambien at bedtime prn,  to f/u any worsening symptoms or concerns

## 2022-12-19 NOTE — Assessment & Plan Note (Signed)
Mproved, continue myrbetrix

## 2022-12-19 NOTE — Assessment & Plan Note (Signed)
BP Readings from Last 3 Encounters:  12/18/22 116/68  09/08/22 118/66  08/10/22 112/74   Stable, pt to continue medical treatment  - diet, wt control

## 2022-12-19 NOTE — Assessment & Plan Note (Addendum)
Lab Results  Component Value Date   LDLCALC 80 12/18/2022   Unontrolled, pt to continue current statin zetia 10 every day, pravastatin 40 every day as declines other change today

## 2022-12-19 NOTE — Assessment & Plan Note (Signed)
Lab Results  Component Value Date   CREATININE 1.59 (H) 12/18/2022   Stable overall, cont to avoid nephrotoxins

## 2022-12-19 NOTE — Assessment & Plan Note (Signed)
Lab Results  Component Value Date   HGBA1C 5.8 12/18/2022   Stable, pt to continue current medical treatment  - diet, wt control

## 2023-01-10 ENCOUNTER — Other Ambulatory Visit: Payer: Self-pay

## 2023-01-10 ENCOUNTER — Encounter: Payer: Self-pay | Admitting: Family Medicine

## 2023-01-10 ENCOUNTER — Ambulatory Visit: Payer: Medicare Other | Admitting: Family Medicine

## 2023-01-10 VITALS — BP 138/76 | HR 65 | Ht 64.0 in | Wt 179.0 lb

## 2023-01-10 DIAGNOSIS — G8929 Other chronic pain: Secondary | ICD-10-CM

## 2023-01-10 DIAGNOSIS — M25562 Pain in left knee: Secondary | ICD-10-CM

## 2023-01-10 NOTE — Patient Instructions (Signed)
Thank you for coming in today.   You received an injection today. Seek immediate medical attention if the joint becomes red, extremely painful, or is oozing fluid.  

## 2023-01-10 NOTE — Progress Notes (Signed)
   I, Stevenson Clinch, CMA acting as a scribe for Paula Graham, MD.  Paula [name] is a 77 y.o. female who presents to [facility] today for exacerbation of her L knee pain. Pt was last seen by Dr. Denyse Amass on 11/08/21 and was given a L knee steroid injection and advised to use Voltaren gel and work on quad strengthening.  She completed a prior course of PT, 17 visits, at the [facility] location.  Today, pt reports worsening pain and swelling towards the end of the day. Denies mechanical sx. Bruising and swelling present at medial aspect. Has been using Voltaren Gel prn. Notes worsening knee sx over the past couple of weeks.   Dx imaging: 06/27/21 L knee XR  Pertinent review of systems: No fevers or chills  Relevant historical information: COPD   Exam:  BP 138/76   Pulse 65   Ht 5\' 4"  (1.626 m)   Wt 179 lb (81.2 kg)   SpO2 98%   BMI 30.73 kg/m  General: Well Developed, well nourished, and in no acute distress.   MSK: Left knee mild effusion normal motion    Lab and Radiology Results  Procedure: Real-time Ultrasound Guided Injection of left knee joint superior lateral patella space Device: Philips Affiniti 50G/GE Logiq Images permanently stored and available for review in PACS Verbal informed consent obtained.  Discussed risks and benefits of procedure. Warned about infection, bleeding, hyperglycemia damage to structures among others. Patient expresses understanding and agreement Time-out conducted.   Noted no overlying erythema, induration, or other signs of local infection.   Skin prepped in a sterile fashion.   Local anesthesia: Topical Ethyl chloride.   With sterile technique and under real time ultrasound guidance: 40 mg of Kenalog and 2 mL of Marcaine injected into knee joint. Fluid seen entering the joint capsule.   Completed without difficulty   Pain immediately resolved suggesting accurate placement of the medication.   Advised to call if  fevers/chills, erythema, induration, drainage, or persistent bleeding.   Images permanently stored and available for review in the ultrasound unit.  Impression: Technically successful ultrasound guided injection.       Assessment and Plan: 77 y.o. 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  Plan for repeat steroid injection.  Previous injection lasted a year.  Check back as needed.  Recommend also Voltaren gel.   PDMP not reviewed this encounter. Orders Placed This Encounter  Procedures   [place] LIMITED JOINT SPACE STRUCTURES LOW LEFT(NO LINKED CHARGES)    Order Specific Question:   Reason for Exam (SYMPTOM  OR DIAGNOSIS REQUIRED)    Answer:   left knee pain    Order Specific Question:   Preferred imaging location?    Answer:   [facility]   No orders of the defined types were placed in this encounter.    Discussed warning signs or symptoms. Please see discharge instructions. Patient expresses understanding.   The above documentation has been reviewed and is accurate and complete Paula [name], M.D.

## 2023-01-15 ENCOUNTER — Encounter: Payer: Self-pay | Admitting: Internal Medicine

## 2023-01-15 ENCOUNTER — Ambulatory Visit: Payer: Medicare Other | Admitting: Internal Medicine

## 2023-01-15 VITALS — BP 110/76 | HR 62 | Ht 64.0 in | Wt 176.0 lb

## 2023-01-15 DIAGNOSIS — E0590 Thyrotoxicosis, unspecified without thyrotoxic crisis or storm: Secondary | ICD-10-CM

## 2023-01-15 LAB — TSH: TSH: 0.63 u[IU]/mL (ref 0.35–5.50)

## 2023-01-15 LAB — T3, FREE: T3, Free: 2.3 pg/mL (ref 2.3–4.2)

## 2023-01-15 LAB — T4, FREE: Free T4: 0.97 ng/dL (ref 0.60–1.60)

## 2023-01-15 NOTE — Progress Notes (Signed)
Name: Paula [name]  MRN/ DOB: [id number], 08-10-1945    Age/ Sex: 77 y.o., female    PCP: Corwin Levins, MD   Reason for Endocrinology Evaluation: Low TSH      Date of Initial Endocrinology Evaluation: 08/10/2022    HPI: Ms. Paula [name] is a 77 y.o. female with a past medical history of Dyslipidemia. The patient presented for initial endocrinology clinic visit on 08/10/2022 for consultative assistance with her low TSH .   Pt has been noted with intermittent low TSH  in 2023 with a nadir of 0.17 uIU/mL in 06/2022, no concomitant  FT4 or T3.   No amiodarone use  Pt with hx of osteopenia  No cardiac arrhythmia  No Biotin    On her initial visit to our clinic she had a normal TSH, with low total T3, TRAb undetectable  No Fh of thyroid disease    SUBJECTIVE:    Today (01/15/23):  Ms. Paula [name] is here for follow-up on subclinical hyperthyroid.  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  Denies  diarrhea    HISTORY:  Past Medical History:  Past Medical History:  Diagnosis Date   Allergy    Arthritis    Asthma    Bipolar 1 disorder (HCC)    Colon polyps    Depression    Diabetes mellitus without complication (HCC)    GERD (gastroesophageal reflux disease)    History of alcohol abuse    History of chicken pox    History of drug abuse (HCC)    Hyperlipidemia    Hypertension    Urine incontinence    Past Surgical History:  Past Surgical History:  Procedure Laterality Date   APPENDECTOMY     BREAST BIOPSY     CHOLECYSTECTOMY     LUNG BIOPSY     SUBTOTAL COLECTOMY      Social History:  reports that she quit smoking about 6 years ago. Her smoking use included cigarettes. She started smoking about 61 years ago. She has a 55 pack-year smoking history. She has never used smokeless tobacco. She reports current alcohol use of about 4.0 - 6.0 standard drinks of  alcohol per week. She reports current drug use. Frequency: 7.00 times per week. Drug: Marijuana. Family History: family history includes Arthritis in her father; COPD in her mother; Diabetes in her father; Heart disease in her father, paternal grandmother, and paternal uncle; Hyperlipidemia in her mother.   HOME MEDICATIONS: Allergies as of 01/15/2023       Reactions   Penicillins         Medication List        Accurate as of January 15, 2023  9:14 AM. If you have any questions, ask your nurse or doctor.          aspirin EC 81 MG tablet Take 81 mg by mouth daily.   divalproex 250 MG 24 hr tablet Commonly known as: DEPAKOTE ER Take 3 tablets (750 mg total) by mouth every morning.   ezetimibe 10 MG tablet Commonly known as: ZETIA Take 1 tablet (10 mg total) by mouth daily.   famotidine 20 MG tablet Commonly known as: PEPCID TAKE 1 TABLET BY MOUTH TWICE A DAY   Fish Oil 1000 MG Caps Take 2 capsules by mouth daily.   FLUoxetine 10 MG capsule Commonly known as: PROZAC Take 10 mg by  mouth every morning.   FREESTYLE LITE test strip Generic drug: glucose blood USE ASD TO CHECK BLOOD SUGARS 3 TIMES A DAY E11.9 What changed:  when to take this additional instructions   Ingrezza 40 MG capsule Generic drug: valbenazine Take 40 mg by mouth daily.   linaclotide 145 MCG Caps capsule Commonly known as: Linzess Take 1 capsule (145 mcg total) by mouth daily as needed.   mirabegron ER 50 MG Tb24 tablet Commonly known as: MYRBETRIQ Take 1 tablet (50 mg total) by mouth daily.   multivitamin-iron-minerals-folic acid chewable tablet Chew 1 tablet by mouth daily.   pravastatin 40 MG tablet Commonly known as: PRAVACHOL Take 1 tablet (40 mg total) by mouth daily.   QUEtiapine 25 MG tablet Commonly known as: SEROQUEL Take 25 mg by mouth at bedtime.   triamcinolone cream 0.1 % Commonly known as: KENALOG APPLY TO AFFECTED AREA TWICE A DAY   trimethoprim 100 MG  tablet Commonly known as: TRIMPEX Take 1 tablet (100 mg total) by mouth daily.   Vitamin D3 50 MCG (2000 UT) Tabs Take by mouth.   zolpidem 5 MG tablet Commonly known as: AMBIEN Take 1 tablet (5 mg total) by mouth at bedtime as needed for sleep.          REVIEW OF SYSTEMS: A comprehensive ROS was conducted with the patient and is negative except as per HPI   OBJECTIVE:  VS: BP 110/76 (BP Location: Left Arm, Patient Position: Sitting, Cuff Size: Small)   Pulse 62   Ht 5\' 4"  (1.626 m)   Wt 176 lb (79.8 kg)   SpO2 99%   BMI 30.21 kg/m    Wt Readings from Last 3 Encounters:  01/15/23 176 lb (79.8 kg)  01/10/23 179 lb (81.2 kg)  12/18/22 178 lb (80.7 kg)     EXAM: General: Pt appears well and is in NAD  Neck: General: Supple without adenopathy. Thyroid: Thyroid size normal.  No goiter or nodules appreciated  Lungs: Clear with good BS bilat   Heart: Auscultation: RRR. + systolic murmur  Extremities:  BL LE: No pretibial edema   Mental Status: Judgment, insight: Intact Orientation: Oriented to time, place, and person Mood and affect: No depression, anxiety, or agitation     DATA REVIEWED:     Latest Reference Range & Units 01/15/23 09:34  TSH 0.35 - 5.50 uIU/mL 0.63  Triiodothyronine,Free,Serum 2.3 - 4.2 pg/mL 2.3  T4,Free(Direct) 0.60 - 1.60 ng/dL 6.04       Component Ref Range & Units 5 mo ago  TRAB <=2.00 IU/L <1.00    ASSESSMENT/PLAN/RECOMMENDATIONS:   Subclinical Hyperthyroidism :  -Patient is clinically euthyroid -No local neck symptoms -On her last visit her TSH has normalized and her T3 was low -TRAb was negative -Discussed subacute thyroiditis as a differential -Repeat TFTs are normal     Follow-up as needed  Signed electronically by: Lyndle Herrlich, MD  [facility] Endocrinology  [facility] [address] [place] [place], [place] [address] Phone: [phone] FAX: [phone]   CC: Corwin Levins,  MD [address] [place] [address] Phone: [phone] Fax: [phone]   Return to Endocrinology clinic as below: Future Appointments  Date Time Provider Department Center  06/20/2023 10:20 AM Corwin Levins, MD [facility] None

## 2023-02-09 ENCOUNTER — Ambulatory Visit: Payer: Medicare Other | Admitting: Internal Medicine

## 2023-02-26 ENCOUNTER — Encounter: Payer: Self-pay | Admitting: Internal Medicine

## 2023-02-26 ENCOUNTER — Ambulatory Visit (INDEPENDENT_AMBULATORY_CARE_PROVIDER_SITE_OTHER): Payer: Medicare Other | Admitting: Internal Medicine

## 2023-02-26 VITALS — BP 122/76 | HR 73 | Temp 98.6°F | Ht 64.0 in | Wt 173.0 lb

## 2023-02-26 DIAGNOSIS — N1832 Chronic kidney disease, stage 3b: Secondary | ICD-10-CM

## 2023-02-26 DIAGNOSIS — N950 Postmenopausal bleeding: Secondary | ICD-10-CM | POA: Diagnosis not present

## 2023-02-26 DIAGNOSIS — I10 Essential (primary) hypertension: Secondary | ICD-10-CM

## 2023-02-26 DIAGNOSIS — R8290 Unspecified abnormal findings in urine: Secondary | ICD-10-CM | POA: Insufficient documentation

## 2023-02-26 MED ORDER — CIPROFLOXACIN HCL 500 MG PO TABS: 500.0000 mg | ORAL_TABLET | Freq: Two times a day (BID) | ORAL | 0 refills | Status: AC

## 2023-02-26 NOTE — Assessment & Plan Note (Addendum)
Etiology unclear, for GYN referral, and transvag US with pelvic US.  Declines need for cbc today

## 2023-02-26 NOTE — Assessment & Plan Note (Signed)
With high suspicion uti- for ua and cx, empiric cipro 500 bid

## 2023-02-26 NOTE — Progress Notes (Signed)
Patient ID: Paula [name], female   DOB: 01-13-1946, 77 y.o.   MRN: [id number]        Chief Complaint: follow up foul smelling urine, post menopausal bleeding, htn, ckd3a       HPI:  Paula [name] is a 77 y.o. female here with c/o 3 days onset foul smelling urine, but no fever, chills, abd pain, n/v, flank pain.  Also has significant post menopausal bleeding for 1-2 wks for unclear reason.  Pt denies chest pain, increased sob or doe, wheezing, orthopnea, PND, increased LE swelling, palpitations, dizziness or syncope.   Pt denies polydipsia, polyuria, or new focal neuro s/s.         Wt Readings from Last 3 Encounters:  02/26/23 173 lb (78.5 kg)  01/15/23 176 lb (79.8 kg)  01/10/23 179 lb (81.2 kg)   BP Readings from Last 3 Encounters:  02/26/23 122/76  01/15/23 110/76  01/10/23 138/76         Past Medical History:  Diagnosis Date   Allergy    Arthritis    Asthma    Bipolar 1 disorder (HCC)    Colon polyps    Depression    Diabetes mellitus without complication (HCC)    GERD (gastroesophageal reflux disease)    History of alcohol abuse    History of chicken pox    History of drug abuse (HCC)    Hyperlipidemia    Hypertension    Urine incontinence    Past Surgical History:  Procedure Laterality Date   APPENDECTOMY     BREAST BIOPSY     CHOLECYSTECTOMY     LUNG BIOPSY     SUBTOTAL COLECTOMY      reports that she quit smoking about 6 years ago. Her smoking use included cigarettes. She started smoking about 61 years ago. She has a 55 pack-year smoking history. She has never used smokeless tobacco. She reports current alcohol use of about 4.0 - 6.0 standard drinks of alcohol per week. She reports current drug use. Frequency: 7.00 times per week. Drug: Marijuana. family history includes Arthritis in her father; COPD in her mother; Diabetes in her father; Heart disease in her father, paternal grandmother, and paternal uncle; Hyperlipidemia in her mother. Allergies  Allergen  Reactions   Penicillins    Current Outpatient Medications on File Prior to Visit  Medication Sig Dispense Refill   aspirin EC 81 MG tablet Take 81 mg by mouth daily.     Cholecalciferol (VITAMIN D3) 2000 units TABS Take by mouth.     divalproex (DEPAKOTE ER) 250 MG 24 hr tablet Take 3 tablets (750 mg total) by mouth every morning. 30 tablet 0   ezetimibe (ZETIA) 10 MG tablet Take 1 tablet (10 mg total) by mouth daily. 90 tablet 3   famotidine (PEPCID) 20 MG tablet TAKE 1 TABLET BY MOUTH TWICE A DAY 180 tablet 3   FLUoxetine (PROZAC) 10 MG capsule Take 10 mg by mouth every morning.  2   glucose blood (FREESTYLE LITE) test strip USE ASD TO CHECK BLOOD SUGARS 3 TIMES A DAY E11.9 (Patient taking differently: daily. USE ASD TO CHECK BLOOD SUGARS 1 TIME A DAY E11.9) 300 strip 3   linaclotide (LINZESS) 145 MCG CAPS capsule Take 1 capsule (145 mcg total) by mouth daily as needed. 30 capsule 11   mirabegron ER (MYRBETRIQ) 50 MG TB24 tablet Take 1 tablet (50 mg total) by mouth daily. 90 tablet 3   multivitamin-iron-minerals-folic acid (CENTRUM) chewable tablet Chew  1 tablet by mouth daily.     Omega-3 Fatty Acids (FISH OIL) 1000 MG CAPS Take 2 capsules by mouth daily.     pravastatin (PRAVACHOL) 40 MG tablet Take 1 tablet (40 mg total) by mouth daily. 90 tablet 3   QUEtiapine (SEROQUEL) 25 MG tablet Take 25 mg by mouth at bedtime.     triamcinolone cream (KENALOG) 0.1 % APPLY TO AFFECTED AREA TWICE A DAY 30 g 1   trimethoprim (TRIMPEX) 100 MG tablet Take 1 tablet (100 mg total) by mouth daily. 90 tablet 3   valbenazine (INGREZZA) 40 MG capsule Take 40 mg by mouth daily.     zolpidem (AMBIEN) 5 MG tablet Take 1 tablet (5 mg total) by mouth at bedtime as needed for sleep. 30 tablet 5   No current facility-administered medications on file prior to visit.        ROS:  All others reviewed and negative.  Objective        PE:  BP 122/76 (BP Location: Right Arm, Patient Position: Sitting, Cuff Size:  Normal)   Pulse 73   Temp 98.6 F (37 C) (Oral)   Ht 5\' 4"  (1.626 m)   Wt 173 lb (78.5 kg)   SpO2 98%   BMI 29.70 kg/m                 Constitutional: Pt appears in NAD               HENT: Head: NCAT.                Right Ear: External ear normal.                 Left Ear: External ear normal.                Eyes: . Pupils are equal, round, and reactive to light. Conjunctivae and EOM are normal               Nose: without d/c or deformity               Neck: Neck supple. Gross normal ROM               Cardiovascular: Normal rate and regular rhythm.                 Pulmonary/Chest: Effort normal and breath sounds without rales or wheezing.                Abd:  Soft, NT, ND, + BS, no organomegaly               Neurological: Pt is alert. At baseline orientation, motor grossly intact               Skin: Skin is warm. No rashes, no other new lesions, LE edema - none               Psychiatric: Pt behavior is normal without agitation   Micro: none  Cardiac tracings I have personally interpreted today:  none  Pertinent Radiological findings (summarize): none   Lab Results  Component Value Date   WBC 6.5 12/18/2022   HGB 14.1 12/18/2022   HCT 43.3 12/18/2022   PLT 225.0 12/18/2022   GLUCOSE 99 12/18/2022   CHOL 166 12/18/2022   TRIG 112.0 12/18/2022   HDL 63.60 12/18/2022   LDLDIRECT 73.0 06/16/2021   LDLCALC 80 12/18/2022   ALT 21 12/18/2022   AST 23 12/18/2022  NA 139 12/18/2022   K 5.1 12/18/2022   CL 105 12/18/2022   CREATININE 1.59 (H) 12/18/2022   BUN 23 12/18/2022   CO2 26 12/18/2022   TSH 0.63 01/15/2023   HGBA1C 5.8 12/18/2022   MICROALBUR 0.9 12/14/2020   Assessment/Plan:  Paula [name] is a 77 y.o. White or Caucasian [1] female with  has a past medical history of Allergy, Arthritis, Asthma, Bipolar 1 disorder (HCC), Colon polyps, Depression, Diabetes mellitus without complication (HCC), GERD (gastroesophageal reflux disease), History of alcohol abuse,  History of chicken pox, History of drug abuse (HCC), Hyperlipidemia, Hypertension, and Urine incontinence.  CKD (chronic kidney disease) stage 3, GFR 30-59 ml/min (HCC) Lab Results  Component Value Date   CREATININE 1.59 (H) 12/18/2022   Stable overall, cont to avoid nephrotoxins   Essential hypertension BP Readings from Last 3 Encounters:  02/26/23 122/76  01/15/23 110/76  01/10/23 138/76   Stable, pt to continue medical treatment  - diet, wt control   Foul smelling urine With high suspicion uti- for ua and cx, empiric cipro 500 bid  Post-menopausal bleeding Etiology unclear, for GYN referral, and transvag US with pelvic US.  Declines need for cbc today  Followup: Return if symptoms worsen or fail to improve.  Oliver Barre, MD 02/26/2023 7:25 PM [facility] Internal Medicine

## 2023-02-26 NOTE — Patient Instructions (Signed)
Please take all new medication as prescribed - the antibiotic  Please continue all other medications as before, and refills have been done if requested.  Please have the pharmacy call with any other refills you may need.  Please continue your efforts at being more active, low cholesterol diet, and weight control.  You are otherwise up to date with prevention measures today.  Please keep your appointments with your specialists as you may have planned  You will be contacted regarding the referral for: Transvaginal U/s and Pelvic U/s as we mentioned, and the GYN referral  Please go to the LAB at the blood drawing area for the tests to be done - just the urine testing today  You will be contacted by phone if any changes need to be made immediately.  Otherwise, you will receive a letter about your results with an explanation, but please check with MyChart first.

## 2023-02-26 NOTE — Assessment & Plan Note (Signed)
Lab Results  Component Value Date   CREATININE 1.59 (H) 12/18/2022   Stable overall, cont to avoid nephrotoxins

## 2023-02-26 NOTE — Assessment & Plan Note (Signed)
BP Readings from Last 3 Encounters:  02/26/23 122/76  01/15/23 110/76  01/10/23 138/76   Stable, pt to continue medical treatment  - diet, wt control

## 2023-02-27 LAB — URINALYSIS, ROUTINE W REFLEX MICROSCOPIC
Bilirubin Urine: NEGATIVE
Nitrite: NEGATIVE
Specific Gravity, Urine: 1.02 (ref 1.000–1.030)
Total Protein, Urine: NEGATIVE
Urine Glucose: NEGATIVE
Urobilinogen, UA: 0.2 (ref 0.0–1.0)
pH: 6 (ref 5.0–8.0)

## 2023-02-28 ENCOUNTER — Ambulatory Visit
Admission: RE | Admit: 2023-02-28 | Discharge: 2023-02-28 | Disposition: A | Discharge status: Home / Self Care | Payer: Medicare Other | Source: Ambulatory Visit | Attending: Internal Medicine | Admitting: Internal Medicine

## 2023-02-28 DIAGNOSIS — N950 Postmenopausal bleeding: Secondary | ICD-10-CM

## 2023-02-28 LAB — URINE CULTURE

## 2023-03-08 ENCOUNTER — Ambulatory Visit: Payer: Medicare Other | Admitting: Obstetrics and Gynecology

## 2023-03-08 ENCOUNTER — Other Ambulatory Visit (HOSPITAL_COMMUNITY)
Admission: RE | Admit: 2023-03-08 | Discharge: 2023-03-08 | Disposition: A | Discharge status: Home / Self Care | Payer: Medicare Other | Source: Ambulatory Visit | Attending: Obstetrics and Gynecology | Admitting: Obstetrics and Gynecology

## 2023-03-08 ENCOUNTER — Encounter: Payer: Self-pay | Admitting: Obstetrics and Gynecology

## 2023-03-08 VITALS — BP 128/62 | HR 74 | Ht 63.39 in | Wt 173.0 lb

## 2023-03-08 DIAGNOSIS — N888 Other specified noninflammatory disorders of cervix uteri: Secondary | ICD-10-CM

## 2023-03-08 DIAGNOSIS — N950 Postmenopausal bleeding: Secondary | ICD-10-CM | POA: Insufficient documentation

## 2023-03-08 DIAGNOSIS — Z1151 Encounter for screening for human papillomavirus (HPV): Secondary | ICD-10-CM | POA: Insufficient documentation

## 2023-03-08 DIAGNOSIS — Z01411 Encounter for gynecological examination (general) (routine) with abnormal findings: Secondary | ICD-10-CM | POA: Insufficient documentation

## 2023-03-08 DIAGNOSIS — Z124 Encounter for screening for malignant neoplasm of cervix: Secondary | ICD-10-CM | POA: Insufficient documentation

## 2023-03-08 NOTE — Progress Notes (Signed)
77 y.o. G1P1001 postmenopausal female with osteoporosis, former smoker (55PY) here for PMB.  No LMP recorded. Patient is postmenopausal.  Patient states that she noticed bright red bleeding on 02/18/23. Bleeding lasted for ~10d. She had an ultrasound done on 02/28/23 and after the ultrasound she had a lot of bleeding. Noted some cramping during this time. Now resolved.    Last PAP: unknown History of abnormal Pap: no Last mammogram: 08/03/19 Bi-rads 1 neg Last colonoscopy: 2017 Lat DXA: 03/30/2017 osteopenic Sexually active: no    OB History  Gravida Para Term Preterm AB Living  1 1 1     1   SAB IAB Ectopic Multiple Live Births          1    # Outcome Date GA Lbr Len/2nd Weight Sex Type Anes PTL Lv  1 Term      Vag-Spont   LIV    Past Medical History:  Diagnosis Date   Allergy    Arthritis    Asthma    Bipolar 1 disorder (HCC)    Colon polyps    Depression    Diabetes mellitus without complication (HCC)    GERD (gastroesophageal reflux disease)    History of alcohol abuse    History of chicken pox    History of drug abuse (HCC)    Hyperlipidemia    Hypertension    Urine incontinence     Past Surgical History:  Procedure Laterality Date   APPENDECTOMY     BREAST BIOPSY     CHOLECYSTECTOMY     LUNG BIOPSY     SUBTOTAL COLECTOMY      Current Outpatient Medications on File Prior to Visit  Medication Sig Dispense Refill   aspirin EC 81 MG tablet Take 81 mg by mouth daily.     Cholecalciferol (VITAMIN D3) 2000 units TABS Take by mouth.     ciprofloxacin (CIPRO) 500 MG tablet Take 1 tablet (500 mg total) by mouth 2 (two) times daily for 10 days. 20 tablet 0   divalproex (DEPAKOTE ER) 250 MG 24 hr tablet Take 3 tablets (750 mg total) by mouth every morning. 30 tablet 0   ezetimibe (ZETIA) 10 MG tablet Take 1 tablet (10 mg total) by mouth daily. 90 tablet 3   famotidine (PEPCID) 20 MG tablet TAKE 1 TABLET BY MOUTH TWICE A DAY 180 tablet 3   FLUoxetine (PROZAC) 10  MG capsule Take 10 mg by mouth every morning.  2   glucose blood (FREESTYLE LITE) test strip USE ASD TO CHECK BLOOD SUGARS 3 TIMES A DAY E11.9 (Patient taking differently: daily. USE ASD TO CHECK BLOOD SUGARS 1 TIME A DAY E11.9) 300 strip 3   linaclotide (LINZESS) 145 MCG CAPS capsule Take 1 capsule (145 mcg total) by mouth daily as needed. 30 capsule 11   mirabegron ER (MYRBETRIQ) 50 MG TB24 tablet Take 1 tablet (50 mg total) by mouth daily. 90 tablet 3   multivitamin-iron-minerals-folic acid (CENTRUM) chewable tablet Chew 1 tablet by mouth daily.     Omega-3 Fatty Acids (FISH OIL) 1000 MG CAPS Take 2 capsules by mouth daily.     pravastatin (PRAVACHOL) 40 MG tablet Take 1 tablet (40 mg total) by mouth daily. 90 tablet 3   QUEtiapine (SEROQUEL) 25 MG tablet Take 25 mg by mouth at bedtime.     triamcinolone cream (KENALOG) 0.1 % APPLY TO AFFECTED AREA TWICE A DAY 30 g 1   trimethoprim (TRIMPEX) 100 MG tablet Take 1 tablet (  100 mg total) by mouth daily. 90 tablet 3   valbenazine (INGREZZA) 40 MG capsule Take 40 mg by mouth daily.     zolpidem (AMBIEN) 5 MG tablet Take 1 tablet (5 mg total) by mouth at bedtime as needed for sleep. 30 tablet 5   No current facility-administered medications on file prior to visit.    Allergies  Allergen Reactions   Penicillins       PE Today's Vitals   03/08/23 1409  BP: 128/62  Pulse: 74  SpO2: 100%  Weight: 173 lb (78.5 kg)  Height: 5' 3.39" (1.61 m)   Body mass index is 30.27 kg/m.  Physical Exam Vitals reviewed. Exam conducted with a chaperone present.  Constitutional:      General: She is not in acute distress.    Appearance: Normal appearance.  HENT:     Head: Normocephalic and atraumatic.     Nose: Nose normal.  Eyes:     Extraocular Movements: Extraocular movements intact.     Conjunctiva/sclera: Conjunctivae normal.  Pulmonary:     Effort: Pulmonary effort is normal.  Genitourinary:    Exam position: Lithotomy position.      Cervix: Friability and cervical bleeding present. No cervical motion tenderness or discharge.     Uterus: Normal. Not enlarged and not tender.      Adnexa: Right adnexa normal and left adnexa normal.     Comments: Vulvar atrophy Narrow vaginal intoitus. Bleeding on speculum placement appearing to come from the cervix. Unable to clearly evaluate cervix due to bleeding and narrow introitus. Bimanual exam performed and cervical nodularity palpated. PAP collected. Minimal bleeding after pressure was applied with scopette. Musculoskeletal:        General: Normal range of motion.     Cervical back: Normal range of motion.  Lymphadenopathy:     Lower Body: No right inguinal adenopathy. No left inguinal adenopathy.  Neurological:     General: No focal deficit present.     Mental Status: She is alert.  Psychiatric:        Mood and Affect: Mood normal.        Behavior: Behavior normal.     Imaging: 02/28/2023: TVUS report not complete, however  my interpretation is as follows: Uterus 6x3x3cm with ET 6.54mm. Endometrium contain a 9mm mass without blood flow and hypoechoic fluid. There is an anterior cervical mass measuring 1cm with blood flow. Ovaries were not identified. There is a small amount of free fluid in the posterior CDS.   Assessment and Plan:        Post-menopausal bleeding Assessment & Plan: Pt presents with acute episode of postmenopausal bleeding. TVUS read pending, however on my interpretation there is a thickened endometrium with a 9mm mass without blood flow and hypoechoic fluid. Additionally, there is an anterior cervical mass measuring 1cm with blood flow.  Exam notable for friable tissue, cervical nodularity and bright red bleeding with speculum placement. Concerning for cervical malignancy. STAT PAP collected. Patient will also need assessment of endometrium but given atrophy and narrowed vaginal opening, this will need to be done via hysteroscopy, D&C. However if PAP is  abnormal, would recommend additional work-up with GYN ONC. Recommend ibuprofen 400mg  tonight to help minimize bleeding.  Orders: -     Cytology - PAP  Cervical mass Assessment & Plan: See PMB assessment  Orders: -     Cytology - PAP  Cervical cancer screening -     Cytology - PAP     Tejah Brekke  Lasandra Beech, MD

## 2023-03-08 NOTE — Progress Notes (Deleted)
77 y.o. No obstetric history on file. Postmenopausal female here for consultation for PMB.  No LMP recorded. Patient is postmenopausal.     Last mammogram: *** Last colonoscopy: *** Sexually active: ***  Exercising: ***   GYN HISTORY: ***  OB History  No obstetric history on file.    Past Medical History:  Diagnosis Date   Allergy    Arthritis    Asthma    Bipolar 1 disorder (HCC)    Colon polyps    Depression    Diabetes mellitus without complication (HCC)    GERD (gastroesophageal reflux disease)    History of alcohol abuse    History of chicken pox    History of drug abuse (HCC)    Hyperlipidemia    Hypertension    Urine incontinence     Past Surgical History:  Procedure Laterality Date   APPENDECTOMY     BREAST BIOPSY     CHOLECYSTECTOMY     LUNG BIOPSY     SUBTOTAL COLECTOMY      Current Outpatient Medications on File Prior to Visit  Medication Sig Dispense Refill   aspirin EC 81 MG tablet Take 81 mg by mouth daily.     Cholecalciferol (VITAMIN D3) 2000 units TABS Take by mouth.     ciprofloxacin (CIPRO) 500 MG tablet Take 1 tablet (500 mg total) by mouth 2 (two) times daily for 10 days. 20 tablet 0   divalproex (DEPAKOTE ER) 250 MG 24 hr tablet Take 3 tablets (750 mg total) by mouth every morning. 30 tablet 0   ezetimibe (ZETIA) 10 MG tablet Take 1 tablet (10 mg total) by mouth daily. 90 tablet 3   famotidine (PEPCID) 20 MG tablet TAKE 1 TABLET BY MOUTH TWICE A DAY 180 tablet 3   FLUoxetine (PROZAC) 10 MG capsule Take 10 mg by mouth every morning.  2   glucose blood (FREESTYLE LITE) test strip USE ASD TO CHECK BLOOD SUGARS 3 TIMES A DAY E11.9 (Patient taking differently: daily. USE ASD TO CHECK BLOOD SUGARS 1 TIME A DAY E11.9) 300 strip 3   linaclotide (LINZESS) 145 MCG CAPS capsule Take 1 capsule (145 mcg total) by mouth daily as needed. 30 capsule 11   mirabegron ER (MYRBETRIQ) 50 MG TB24 tablet Take 1 tablet (50 mg total) by mouth daily. 90  tablet 3   multivitamin-iron-minerals-folic acid (CENTRUM) chewable tablet Chew 1 tablet by mouth daily.     Omega-3 Fatty Acids (FISH OIL) 1000 MG CAPS Take 2 capsules by mouth daily.     pravastatin (PRAVACHOL) 40 MG tablet Take 1 tablet (40 mg total) by mouth daily. 90 tablet 3   QUEtiapine (SEROQUEL) 25 MG tablet Take 25 mg by mouth at bedtime.     triamcinolone cream (KENALOG) 0.1 % APPLY TO AFFECTED AREA TWICE A DAY 30 g 1   trimethoprim (TRIMPEX) 100 MG tablet Take 1 tablet (100 mg total) by mouth daily. 90 tablet 3   valbenazine (INGREZZA) 40 MG capsule Take 40 mg by mouth daily.     zolpidem (AMBIEN) 5 MG tablet Take 1 tablet (5 mg total) by mouth at bedtime as needed for sleep. 30 tablet 5   No current facility-administered medications on file prior to visit.    Allergies  Allergen Reactions   Penicillins       PE There were no vitals filed for this visit. There is no height or weight on file to calculate BMI.  Physical Exam Vitals reviewed. Exam  conducted with a chaperone present.  Constitutional:      General: She is not in acute distress.    Appearance: Normal appearance.  HENT:     Head: Normocephalic and atraumatic.     Nose: Nose normal.  Eyes:     Extraocular Movements: Extraocular movements intact.     Conjunctiva/sclera: Conjunctivae normal.  Pulmonary:     Effort: Pulmonary effort is normal.  Genitourinary:    General: Normal vulva.     Exam position: Lithotomy position.     Vagina: Normal. No vaginal discharge.     Cervix: Normal. No cervical motion tenderness, discharge or lesion.     Uterus: Normal. Not enlarged and not tender.      Adnexa: Right adnexa normal and left adnexa normal.  Musculoskeletal:        General: Normal range of motion.     Cervical back: Normal range of motion.  Neurological:     General: No focal deficit present.     Mental Status: She is alert.  Psychiatric:        Mood and Affect: Mood normal.        Behavior: Behavior  normal.     02/28/23 TVUS images were reviewed. Final read is not available. On my interpretation, the uterus is 6x3x3cm with ET 10mm. Endometrium was a 9mm mass without blood flow and hypoechoic fluid. The cervix has an anterior, 1cm, homogenous mass with regular borders. Ovaries were not identified. Minimal free fluid was noted in the posterior CDS.   Assessment and Plan:        Post-menopausal bleeding     Rosalyn Gess, MD

## 2023-03-08 NOTE — Assessment & Plan Note (Signed)
See PMB assessment

## 2023-03-08 NOTE — Assessment & Plan Note (Addendum)
Pt presents with acute episode of postmenopausal bleeding. TVUS read pending, however on my interpretation there is a thickened endometrium with a 9mm mass without blood flow and hypoechoic fluid. Additionally, there is an anterior cervical mass measuring 1cm with blood flow.  Exam notable for friable tissue, cervical nodularity and bright red bleeding with speculum placement. Concerning for cervical malignancy. STAT PAP collected. Patient will also need assessment of endometrium but given atrophy and narrowed vaginal opening, this will need to be done via hysteroscopy, D&C. However if PAP is abnormal, would recommend additional work-up with GYN ONC. Recommend ibuprofen 400mg  tonight to help minimize bleeding.

## 2023-03-09 ENCOUNTER — Other Ambulatory Visit: Payer: Self-pay | Admitting: Internal Medicine

## 2023-03-09 ENCOUNTER — Other Ambulatory Visit: Payer: Self-pay

## 2023-03-13 LAB — CYTOLOGY - PAP
Comment: NEGATIVE
Comment: NEGATIVE
Comment: NEGATIVE
Diagnosis: HIGH — AB
HPV 16: POSITIVE — AB
HPV 18 / 45: NEGATIVE
High risk HPV: POSITIVE — AB

## 2023-03-15 ENCOUNTER — Other Ambulatory Visit: Payer: Self-pay

## 2023-03-15 ENCOUNTER — Telehealth: Payer: Self-pay | Admitting: Obstetrics and Gynecology

## 2023-03-15 ENCOUNTER — Other Ambulatory Visit: Payer: Self-pay | Admitting: Internal Medicine

## 2023-03-15 DIAGNOSIS — R87613 High grade squamous intraepithelial lesion on cytologic smear of cervix (HGSIL): Secondary | ICD-10-CM

## 2023-03-15 DIAGNOSIS — E782 Mixed hyperlipidemia: Secondary | ICD-10-CM

## 2023-03-15 DIAGNOSIS — N950 Postmenopausal bleeding: Secondary | ICD-10-CM

## 2023-03-15 DIAGNOSIS — N888 Other specified noninflammatory disorders of cervix uteri: Secondary | ICD-10-CM

## 2023-03-15 NOTE — Telephone Encounter (Signed)
Spoke with patient regarding abnormal PAP results- HSIL, +HPV with features c/f SCC. Recommend amb referral to GYN ONC for additional management. Patient no reporting any additional bleeding since last visit. All questions answered. Orders placed.

## 2023-03-16 ENCOUNTER — Telehealth: Payer: Self-pay

## 2023-03-16 NOTE — Telephone Encounter (Signed)
Spoke with Paula [name] regarding her referral to GYN oncology. She has an appointment scheduled with Dr. Alvester Morin on 03/26/23 at 11:15. Patient agrees to date and time. She has been provided with office address and location. She is also aware of our mask and visitor policy. Patient verbalized understanding and will call with any questions.

## 2023-03-22 ENCOUNTER — Encounter: Payer: Self-pay | Admitting: Psychiatry

## 2023-03-22 ENCOUNTER — Telehealth: Payer: Self-pay

## 2023-03-22 ENCOUNTER — Other Ambulatory Visit: Payer: Self-pay

## 2023-03-22 DIAGNOSIS — N888 Other specified noninflammatory disorders of cervix uteri: Secondary | ICD-10-CM

## 2023-03-22 NOTE — Telephone Encounter (Signed)
[facility] Clinical Social Work  Clinical Social Work was referred by nurse for assessment of psychosocial needs per SDOH screening protocol.  Patient's nurse reported to CSW of score on SDOH and informed CSW that patient has psychiatrist appointment scheduled for 03/25/2021. Patient reportedly declined CSW referral.  Clinical Social Worker attempted to contact patient by phone to offer support and assess for needs. No answer, left vm with direct contact information.    Marguerita Merles, LCSWA Clinical Social Worker [facility]

## 2023-03-22 NOTE — Telephone Encounter (Signed)
I reached out to Ms. Paula [name] today for meaningful use update for upcoming new patient appointment with Dr. Alvester Morin on Monday 10/21. In reviewing the SDOH questions. She made an 8 on depression section (medium risk) Pt reports she currently sees Psychiatry. Declined social work referral. Stating she wants to see what "happens on Monday" at her appointment. I did make a note in the depression section.    I reached out to [facility]. She advised me to put in a referral. She will follow up with patient just to make sure we cover everything we need to.

## 2023-03-23 ENCOUNTER — Telehealth: Payer: Self-pay

## 2023-03-23 NOTE — Telephone Encounter (Signed)
[facility] Clinical Social Work  Clinical Social Work was referred by medical provider for assessment of psychosocial needs due to high SDOH screenings.  Clinical Social Worker contacted patient by phone to offer support and assess for needs.  Patient denied any SI/HI and feels "much better" due to medication being adjusted. No follow up needed at this time.     Marguerita Merles, LCSWA Clinical Social Worker [facility]

## 2023-03-26 ENCOUNTER — Inpatient Hospital Stay: Payer: Medicare Other | Attending: Psychiatry | Admitting: Psychiatry

## 2023-03-26 ENCOUNTER — Encounter: Payer: Self-pay | Admitting: Psychiatry

## 2023-03-26 ENCOUNTER — Other Ambulatory Visit: Payer: Self-pay

## 2023-03-26 VITALS — BP 140/78 | HR 69 | Temp 98.4°F | Resp 20 | Ht 63.0 in | Wt 173.0 lb

## 2023-03-26 DIAGNOSIS — R87613 High grade squamous intraepithelial lesion on cytologic smear of cervix (HGSIL): Secondary | ICD-10-CM | POA: Insufficient documentation

## 2023-03-26 DIAGNOSIS — N950 Postmenopausal bleeding: Secondary | ICD-10-CM | POA: Diagnosis not present

## 2023-03-26 DIAGNOSIS — A630 Anogenital (venereal) warts: Secondary | ICD-10-CM | POA: Diagnosis not present

## 2023-03-26 DIAGNOSIS — N888 Other specified noninflammatory disorders of cervix uteri: Secondary | ICD-10-CM

## 2023-03-26 NOTE — Patient Instructions (Signed)
It was a pleasure to see you in clinic today. - We took a biopsy today. - We will try to get a PET scan scheduled and completed - Return visit planned for after PET scan to review final treatment recommendations   Thank you very much for allowing me to provide care for you today.  I appreciate your confidence in choosing our Gynecologic Oncology team at [facility].  If you have any questions about your visit today please call our office or send [place] a MyChart message and we will get back to you as soon as possible.

## 2023-03-26 NOTE — Progress Notes (Signed)
GYNECOLOGIC ONCOLOGY NEW PATIENT CONSULTATION  Date of Service: 03/26/2023 Referring Provider: Darrell Jewel, MD   ASSESSMENT AND PLAN: Paula [name] is a 77 y.o. woman with postmenopausal bleeding and concern for abnormal cervix with H SIL, HPV 16+ Pap with exam findings concerning for clinically suspicious stage IIB cervical cancer.  Reviewed exam findings today.  Friable hyper glandular abnormal tissue at the vaginal apex replacing cervix which is concerning for a cervical cancer.  Additionally, exam with concern for right parametrial involvement.  Cervical biopsy obtained today.  Unable to perform endocervical or endometrial biopsy due to inability to identify in the cervical os and pass os finder/endometrial Pipelle.  Based on clinical findings, if this is confirmed to represent a cervical cancer, given parametrial involvement, would recommend treatment with primary chemoRT.  Additionally, recommend PET/CT to rule out evidence of metastatic disease.  Reviewed with patient that if metastatic disease identified, then treatment would be focused on systemic therapy.  We could also discuss palliative care if desired in the setting of metastatic disease as this would not be curable, but treatable, compared to locally advanced disease is curable.  We will follow-up biopsy results.  Plan for patient to return to clinic for visit after completion of PET/CT so that we can review results and imaging and make a final treatment plan.  A copy of this note was sent to the patient's referring provider.  Clide Cliff, MD Gynecologic Oncology   Medical Decision Making I personally spent  TOTAL 70 minutes face-to-face and non-face-to-face in the care of this patient, which includes all pre, intra, and post visit time on the date of service.   ------------  CC: Postmenopausal bleeding, cervical lesion, abnormal Pap smear  HISTORY OF PRESENT ILLNESS:  Paula [name] is a 77 y.o. woman who is  seen in consultation at the request of Darrell Jewel, MD for evaluation of HSIL, HPV 16+ Pap smear with postmenopausal bleeding and possible cervical lesion.  Patient was seen by GYN on 03/08/2023 for postmenopausal bleeding that started on 02/18/23 months of 10 days.  She had an ultrasound completed on 02/28/2023 and after the ultrasound noted more bleeding and cramping.  The bleeding had since resolved.  The ultrasound showed an endometrial thickness of 3.3 mm, and a 9 mm echogenic mass within the fundal endometrial canal and multiple large cervical cysts, none simple in appearance with possible 11 mm hypoechoic solid area in the left cervix.  On exam at her visit she was noted to have a narrow introitus.  Cervix was not clearly visualized due to bleeding and narrow introitus.  Bimanual exam noted cervical nodularity.  Pap was collected and returned with H SIL, HPV 16 positive.  Today patient presents with her granddaughter.  She denies current bleeding.  She also denies any postmenopausal bleeding or spotting prior to the most recent events.  She denies abdominal bloating, early satiety, change in bowel or bladder habits.  She does note about a 10 pound weight loss in the past 1 to 2 months which she attributes to depression.    PAST MEDICAL HISTORY: Past Medical History:  Diagnosis Date   Allergy    Arthritis    Bipolar 1 disorder (HCC)    CKD (chronic kidney disease)    Colon polyps    COPD (chronic obstructive pulmonary disease) (HCC)    Depression    Diabetes mellitus without complication (HCC)    GERD (gastroesophageal reflux disease)    History of alcohol abuse  History of chicken pox    History of drug abuse (HCC)    Hyperlipidemia    Hypertension    Mitral valve regurgitation    Urine incontinence     PAST SURGICAL HISTORY: Past Surgical History:  Procedure Laterality Date   APPENDECTOMY     BREAST BIOPSY     CHOLECYSTECTOMY     LUNG BIOPSY     SUBTOTAL COLECTOMY  2005    [facility] [place]    OB/GYN HISTORY: OB History  Gravida Para Term Preterm AB Living  1 1 1     1   SAB IAB Ectopic Multiple Live Births          1    # Outcome Date GA Lbr Len/2nd Weight Sex Type Anes PTL Lv  1 Term      Vag-Spont   LIV      Age at menarche: 78 Age at menopause: 73 Hx of HRT: no Hx of STI: Gonorrhea in past Last pap: 03/08/23 HSIL, HPV 16+; prior to that maybe 10 years before History of abnormal pap smears: none prior to the current pap smear  SCREENING STUDIES:  Last mammogram: 2022 Last colonoscopy: Maybe around 2005  MEDICATIONS:  Current Outpatient Medications:    aspirin EC 81 MG tablet, Take 81 mg by mouth daily., Disp: , Rfl:    Cholecalciferol (VITAMIN D3) 2000 units TABS, Take by mouth., Disp: , Rfl:    divalproex (DEPAKOTE ER) 250 MG 24 hr tablet, Take 3 tablets (750 mg total) by mouth every morning., Disp: 30 tablet, Rfl: 0   ezetimibe (ZETIA) 10 MG tablet, TAKE 1 TABLET BY MOUTH EVERY DAY, Disp: 90 tablet, Rfl: 3   famotidine (PEPCID) 20 MG tablet, TAKE 1 TABLET BY MOUTH TWICE A DAY, Disp: 180 tablet, Rfl: 3   FLUoxetine (PROZAC) 10 MG capsule, Take 10 mg by mouth daily. Takes 2 one time daily, Disp: , Rfl: 2   INGREZZA 60 MG capsule, Take 60 mg by mouth daily., Disp: , Rfl:    linaclotide (LINZESS) 145 MCG CAPS capsule, Take 1 capsule (145 mcg total) by mouth daily as needed., Disp: 30 capsule, Rfl: 11   multivitamin-iron-minerals-folic acid (CENTRUM) chewable tablet, Chew 1 tablet by mouth daily., Disp: , Rfl:    MYRBETRIQ 50 MG TB24 tablet, TAKE 1 TABLET BY MOUTH EVERY DAY, Disp: 90 tablet, Rfl: 3   Omega-3 Fatty Acids (FISH OIL) 1000 MG CAPS, Take 2 capsules by mouth daily., Disp: , Rfl:    pravastatin (PRAVACHOL) 40 MG tablet, TAKE 1 TABLET BY MOUTH EVERY DAY, Disp: 90 tablet, Rfl: 3   triamcinolone cream (KENALOG) 0.1 %, APPLY TO AFFECTED AREA TWICE A DAY, Disp: 30 g, Rfl: 1   trimethoprim (TRIMPEX) 100 MG tablet, Take 1 tablet (100 mg total)  by mouth daily., Disp: 90 tablet, Rfl: 3   zolpidem (AMBIEN) 5 MG tablet, Take 1 tablet (5 mg total) by mouth at bedtime as needed for sleep., Disp: 30 tablet, Rfl: 5  ALLERGIES: Allergies  Allergen Reactions   Penicillins Other (See Comments)    Has taken and not had reaction, allergist did several tests and told her not to take    FAMILY HISTORY: Family History  Problem Relation Age of Onset   Hyperlipidemia Mother    COPD Mother        smoked   Arthritis Father    Heart disease Father    Diabetes Father    Prostate cancer Brother    Heart disease Paternal  Grandmother    Heart disease Paternal Uncle    Breast cancer Neg Hx    Ovarian cancer Neg Hx    Colon cancer Neg Hx    Endometrial cancer Neg Hx     SOCIAL HISTORY: Social History   Socioeconomic History   Marital status: Single    Spouse name: Not on file   Number of children: 1   Years of education: 14   Highest education level: Not on file  Occupational History   Occupation: Retired Charity fundraiser  Tobacco Use   Smoking status: Former    Current packs/day: 0.00    Average packs/day: 1 pack/day for 55.0 years (55.0 ttl pk-yrs)    Types: Cigarettes    Start date: 08/03/1961    Quit date: 08/03/2016    Years since quitting: 6.6   Smokeless tobacco: Never  Vaping Use   Vaping status: Never Used  Substance and Sexual Activity   Alcohol use: Yes    Alcohol/week: 4.0 - 6.0 standard drinks of alcohol    Types: 4 - 6 Cans of beer per week    Comment: Occ   Drug use: Yes    Frequency: 7.0 times per week    Types: Marijuana   Sexual activity: Not Currently    Birth control/protection: Post-menopausal  Other Topics Concern   Not on file  Social History Narrative   Fun/Hobby: Read   Denies abuse and feels safe at home    Social Determinants of Health   Financial Resource Strain: Low Risk  (03/22/2022)   Overall Financial Resource Strain (CARDIA)    Difficulty of Paying Living Expenses: Not hard at all  Food  Insecurity: No Food Insecurity (03/22/2023)   Hunger Vital Sign    Worried About Running Out of Food in the Last Year: Never true    Ran Out of Food in the Last Year: Never true  Transportation Needs: No Transportation Needs (03/22/2023)   PRAPARE - Administrator, Civil Service (Medical): No    Lack of Transportation (Non-Medical): No  Physical Activity: Inactive (03/22/2022)   Exercise Vital Sign    Days of Exercise per Week: 0 days    Minutes of Exercise per Session: 0 min  Stress: No Stress Concern Present (03/22/2022)   [employer] of Occupational Health - Occupational Stress Questionnaire    Feeling of Stress : Not at all  Social Connections: Socially Isolated (03/22/2022)   Social Connection and Isolation Panel [NHANES]    Frequency of Communication with Friends and Family: More than three times a week    Frequency of Social Gatherings with Friends and Family: More than three times a week    Attends Religious Services: Never    Database administrator or Organizations: No    Attends Banker Meetings: Never    Marital Status: Divorced  Catering manager Violence: Not At Risk (03/22/2023)   Humiliation, Afraid, Rape, and Kick questionnaire    Fear of Current or Ex-Partner: No    Emotionally Abused: No    Physically Abused: No    Sexually Abused: No    REVIEW OF SYSTEMS: New patient intake form was reviewed.  Complete 10-system review is negative except for the following: Anxiety  PHYSICAL EXAM: BP (!) 153/74 (BP Location: Left Arm, Patient Position: Sitting) Comment: Rechecked First Was 155/70 Notified RN  Pulse 69   Temp 98.4 F (36.9 C) (Oral)   Resp 20   Ht 5\' 3"  (1.6 m)   Wt  173 lb (78.5 kg)   SpO2 98%   BMI 30.65 kg/m  Constitutional: No acute distress. Neuro/Psych: Alert, oriented.  Head and Neck: Normocephalic, atraumatic. Neck symmetric without masses. Sclera anicteric.  Respiratory: Normal work of breathing. Clear to  auscultation bilaterally. Cardiovascular: Regular rate and rhythm, no murmurs, rubs, or gallops. Abdomen: Normoactive bowel sounds. Soft, non-distended, non-tender to palpation. No masses appreciated.  Well-healed laparoscopic and infraumbilical midline incisions. Extremities: Grossly normal range of motion. Warm, well perfused. No edema bilaterally. Skin: No rashes or lesions. Lymphatic: No cervical, supraclavicular, or inguinal adenopathy. Genitourinary: External genitalia without lesions. Urethral meatus without lesions or prolapse. On speculum exam, nodular friable tissue replacing cervix at vaginal apex.  No normal cervical tissue identified.  Involved area approximately 3.5 x 2.5 cm. Bimanual exam reveals nodular firm tissue at the vaginal apex, flush with the vaginal wall. Rectovaginal exam with thickening of right parametrial tissue. Exam chaperoned by Warner Mccreedy, NP  CERVICAL BIOPSY  Paula [name] is a 77 y.o. woman who presents today for a cervical biopsy, see details of the visit above.  The procedure was explained to the patient and verbal consent was obtained prior to the procedure.  With the speculum in the vagina, the cervix was cleansed with Betadine. First an endometrial Pipelle was attempted to be passed through what appeared to be possible ectocervical os.  The Pipelle could not be passed.  An os finder was then used to try to dilate the possible cervical os.  This was additionally unsuccessful so endometrial biopsy and endocervical curettage deferred.  A Tischler biopsy forcep was used to obtain a biopsy of the anterior right friable tissue replacing the cervix.  Hemostasis was achieved with Monsel solution.  Patient tolerated the procedure well.   LABORATORY AND RADIOLOGIC DATA: Outside medical records were reviewed to synthesize the above history, along with the history and physical obtained during the visit.  Outside laboratory, pathology, and imaging reports were reviewed,  with pertinent results below.  I personally reviewed the outside images.  WBC  Date Value Ref Range Status  12/18/2022 6.5 4.0 - 10.5 K/uL Final   Hemoglobin  Date Value Ref Range Status  12/18/2022 14.1 12.0 - 15.0 g/dL Final   HCT  Date Value Ref Range Status  12/18/2022 43.3 36.0 - 46.0 % Final   Platelets  Date Value Ref Range Status  12/18/2022 225.0 150.0 - 400.0 K/uL Final   Magnesium  Date Value Ref Range Status  12/19/2021 2.1 1.5 - 2.5 mg/dL Final   Creat  Date Value Ref Range Status  12/11/2019 1.44 (H) 0.60 - 0.93 mg/dL Final    Comment:    For patients >17 years of age, the reference limit for Creatinine is approximately 13% higher for people identified as African-American. .    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 Transabdominal technique was performed for global imaging  of the pelvis including uterus, ovaries, adnexal regions, and pelvic cul-de-sac. It was necessary to proceed with endovaginal exam following the transabdominal exam to visualize the uterus endometrium adnexa.  COMPARISON:  None Available.  FINDINGS: Uterus  Measurements: 6.5 x 2.6 x 3 cm = volume: 27 mL. Multiple large cervical cysts, non simple in appearance. Possible hypoechoic solid area in the left cervix measuring 9 by 10 x 11 mm.  Endometrium  Thickness: 3.3 mm. Small amount of fluid in  the endometrial canal. Echogenic mass within the fundal endometrial canal, this measures 9 by 7 x 3 mm.  Right ovary  Not seen  Left ovary  Not seen  Other findings  Trace free fluid  IMPRESSION: 1. Endometrial thickness of 3.3 mm. 2. 9 mm echogenic mass within the fundal endometrial canal. In the setting of post-menopausal bleeding, endometrial sampling is indicated to exclude carcinoma. If results are benign, sonohysterogram should be considered for focal lesion work-up prior to hysteroscopy. (Ref: Radiological Reasoning: Algorithmic Workup of Abnormal Vaginal Bleeding with Endovaginal Sonography and Sonohysterography. AJR 2008; 478:G95-62) 3. Multiple large cervical cysts, non simple in appearance. Possible 11 mm hypoechoic solid area in the left cervix, suggest correlation with pelvic exam and Pap smear. Small volume fluid in the endometrial canal makes it difficult to exclude distal obstructing process 4. Nonvisualized ovaries.   Electronically Signed By: Jasmine Pang M.D. On: 03/18/2023 21:13

## 2023-03-28 LAB — SURGICAL PATHOLOGY

## 2023-04-05 ENCOUNTER — Ambulatory Visit (HOSPITAL_COMMUNITY)
Admission: RE | Admit: 2023-04-05 | Discharge: 2023-04-05 | Disposition: A | Discharge status: Home / Self Care | Payer: Medicare Other | Source: Ambulatory Visit | Attending: Psychiatry | Admitting: Psychiatry

## 2023-04-05 DIAGNOSIS — N888 Other specified noninflammatory disorders of cervix uteri: Secondary | ICD-10-CM | POA: Diagnosis present

## 2023-04-05 LAB — GLUCOSE, CAPILLARY: Glucose-Capillary: 104 mg/dL — ABNORMAL HIGH (ref 70–99)

## 2023-04-05 MED ORDER — FLUDEOXYGLUCOSE F - 18 (FDG) INJECTION
8.6000 | Freq: Once | INTRAVENOUS | Status: AC
  Administered 2023-04-05: 8.6 via INTRAVENOUS

## 2023-04-16 ENCOUNTER — Encounter: Payer: Self-pay | Admitting: Psychiatry

## 2023-04-16 ENCOUNTER — Encounter: Payer: Self-pay | Admitting: Oncology

## 2023-04-16 ENCOUNTER — Inpatient Hospital Stay: Payer: Medicare Other | Attending: Psychiatry | Admitting: Psychiatry

## 2023-04-16 ENCOUNTER — Other Ambulatory Visit: Payer: Self-pay | Admitting: Oncology

## 2023-04-16 VITALS — BP 118/70 | HR 72 | Temp 98.4°F | Resp 20 | Wt 169.2 lb

## 2023-04-16 DIAGNOSIS — C539 Malignant neoplasm of cervix uteri, unspecified: Secondary | ICD-10-CM | POA: Diagnosis not present

## 2023-04-16 DIAGNOSIS — N2889 Other specified disorders of kidney and ureter: Secondary | ICD-10-CM | POA: Diagnosis not present

## 2023-04-16 DIAGNOSIS — N189 Chronic kidney disease, unspecified: Secondary | ICD-10-CM | POA: Insufficient documentation

## 2023-04-16 DIAGNOSIS — C775 Secondary and unspecified malignant neoplasm of intrapelvic lymph nodes: Secondary | ICD-10-CM | POA: Insufficient documentation

## 2023-04-16 DIAGNOSIS — E1122 Type 2 diabetes mellitus with diabetic chronic kidney disease: Secondary | ICD-10-CM | POA: Diagnosis not present

## 2023-04-16 DIAGNOSIS — N888 Other specified noninflammatory disorders of cervix uteri: Secondary | ICD-10-CM

## 2023-04-16 DIAGNOSIS — I129 Hypertensive chronic kidney disease with stage 1 through stage 4 chronic kidney disease, or unspecified chronic kidney disease: Secondary | ICD-10-CM | POA: Diagnosis not present

## 2023-04-16 NOTE — Patient Instructions (Signed)
It was a pleasure to see you in clinic today. - We have ordered for an MRI to look at the kidney - We will get you in to see Dr. Bertis Ruddy (medical oncology, chemo) and Dr. Roselind Messier (radiation oncology)   Thank you very much for allowing me to provide care for you today.  I appreciate your confidence in choosing our Gynecologic Oncology team at [facility].  If you have any questions about your visit today please call our office or send [place] a MyChart message and we will get back to you as soon as possible.

## 2023-04-16 NOTE — Progress Notes (Signed)
Gynecologic Oncology Return Clinic Visit  Date of Service: 04/16/2023 Referring Provider: Darrell Jewel, MD   Assessment & Plan: Paula [name] is a 77 y.o. 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  Reviewed her biopsy and pet imaging results.  Reviewed outcomes with stage III C1 cervical cancer.  This can be still a curable cancer.  However, does require treatment with chemoradiation.  Reviewed treatment and potential side effects.  Given lymph node involvement, patient may be a candidate to consider immunotherapy in addition to standard chemoradiation.  Will defer to Dr. Bertis Ruddy.  CPS score also requested.  Patient does have medical comorbidities including chronic kidney disease for which she may be a less optimal candidate for cisplatin.  Reviewed that this can be discussed further with Dr. Bertis Ruddy at consultation, but sometimes carboplatin is utilized in lieu of cisplatin.   Referrals placed to medical oncology and radiation oncology.  Additionally, PET with note of exophytic lesion of the upper pole of the left kidney.  MRI abdomen for further evaluation of kidney ordered.  RTC following treatment.  Clide Cliff, MD Gynecologic Oncology   Medical Decision Making I personally spent  TOTAL 40 minutes face-to-face and non-face-to-face in the care of this patient, which includes all pre, intra, and post visit time on the date of service.   ----------------------- Reason for Visit: Follow-up, treatment discussion  Treatment History: Oncology History  Malignant neoplasm of cervix (HCC)  03/08/2023 Initial Diagnosis   Presented to OB/GYN for postmenopausal bleeding. Pap with HSIL, HPV 16 positive   03/26/2023 Initial Biopsy   Cervical biopsy: A. ANTERIOR RIGHT CERVICAL MASS, BIOPSY:       Invasive squamous cell carcinoma, well-differentiated,  keratinizing.    04/05/2023 Cancer Staging   Staging  form: Cervix Uteri, AJCC Version 9 - Clinical stage from 04/05/2023: FIGO Stage IIIC1r, calculated as Stage IIIC1 (cT2b, cN1, cM0) - Signed by Clide Cliff, MD on 04/16/2023 Histopathologic type: Squamous cell carcinoma, NOS Stage prefix: Initial diagnosis Stage confirmation method: Imaging Pelvic nodal status: Positive Pelvic nodal method of assessment: PET Para-aortic status: Negative Para-aortic nodal method of assessment: PET Histologic grade (G): G1 Histologic grading system: 3 grade system   04/05/2023 Imaging   PET: IMPRESSION: 1. The cervical mass is hypermetabolic compatible with primary cervical carcinoma. 2. 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 3. No signs of solid organ metastasis. 4. Possible exophytic lesion off the upper pole of the left kidney measuring 2 cm. Difficult to confirm within the limitations of nondiagnostic CT and unenhanced technique. Consider further evaluation with renal ultrasound. 5. Coronary artery calcifications. 6.  Aortic Atherosclerosis (ICD10-I70.0).     Interval History: Patient reports overall doing well since her last visit.  No major changes.  Denies bleeding for the past week and no pain.   Past Medical/Surgical History: Past Medical History:  Diagnosis Date   Allergy    Arthritis    Bipolar 1 disorder (HCC)    CKD (chronic kidney disease)    Colon polyps    COPD (chronic obstructive pulmonary disease) (HCC)    Depression    Diabetes mellitus without complication (HCC)    GERD (gastroesophageal reflux disease)    History of alcohol abuse    History of chicken pox    History of drug abuse (HCC)    Hyperlipidemia    Hypertension    Mitral valve regurgitation    Urine incontinence     Past  Surgical History:  Procedure Laterality Date   APPENDECTOMY     BREAST BIOPSY     CHOLECYSTECTOMY     LUNG BIOPSY     SUBTOTAL COLECTOMY  2005   [facility] [place]    Family History   Problem Relation Age of Onset   Hyperlipidemia Mother    COPD Mother        smoked   Arthritis Father    Heart disease Father    Diabetes Father    Prostate cancer Brother    Heart disease Paternal Grandmother    Heart disease Paternal Uncle    Breast cancer Neg Hx    Ovarian cancer Neg Hx    Colon cancer Neg Hx    Endometrial cancer Neg Hx     Social History   Socioeconomic History   Marital status: Single    Spouse name: Not on file   Number of children: 1   Years of education: 14   Highest education level: Not on file  Occupational History   Occupation: Retired Charity fundraiser  Tobacco Use   Smoking status: Former    Current packs/day: 0.00    Average packs/day: 1 pack/day for 55.0 years (55.0 ttl pk-yrs)    Types: Cigarettes    Start date: 08/03/1961    Quit date: 08/03/2016    Years since quitting: 6.7   Smokeless tobacco: Never  Vaping Use   Vaping status: Never Used  Substance and Sexual Activity   Alcohol use: Yes    Alcohol/week: 4.0 - 6.0 standard drinks of alcohol    Types: 4 - 6 Cans of beer per week    Comment: Occ   Drug use: Yes    Frequency: 7.0 times per week    Types: Marijuana   Sexual activity: Not Currently    Birth control/protection: Post-menopausal  Other Topics Concern   Not on file  Social History Narrative   Fun/Hobby: Read   Denies abuse and feels safe at home    Social Determinants of Health   Financial Resource Strain: Low Risk  (03/22/2022)   Overall Financial Resource Strain (CARDIA)    Difficulty of Paying Living Expenses: Not hard at all  Food Insecurity: No Food Insecurity (03/22/2023)   Hunger Vital Sign    Worried About Running Out of Food in the Last Year: Never true    Ran Out of Food in the Last Year: Never true  Transportation Needs: No Transportation Needs (03/22/2023)   PRAPARE - Administrator, Civil Service (Medical): No    Lack of Transportation (Non-Medical): No  Physical Activity: Inactive (03/22/2022)    Exercise Vital Sign    Days of Exercise per Week: 0 days    Minutes of Exercise per Session: 0 min  Stress: No Stress Concern Present (03/22/2022)   [employer] of Occupational Health - Occupational Stress Questionnaire    Feeling of Stress : Not at all  Social Connections: Socially Isolated (03/22/2022)   Social Connection and Isolation Panel [NHANES]    Frequency of Communication with Friends and Family: More than three times a week    Frequency of Social Gatherings with Friends and Family: More than three times a week    Attends Religious Services: Never    Database administrator or Organizations: No    Attends Banker Meetings: Never    Marital Status: Divorced    Current Medications:  Current Outpatient Medications:    aspirin EC 81 MG tablet, Take 81  mg by mouth daily., Disp: , Rfl:    Cholecalciferol (VITAMIN D3) 2000 units TABS, Take by mouth., Disp: , Rfl:    divalproex (DEPAKOTE ER) 250 MG 24 hr tablet, Take 3 tablets (750 mg total) by mouth every morning., Disp: 30 tablet, Rfl: 0   ezetimibe (ZETIA) 10 MG tablet, TAKE 1 TABLET BY MOUTH EVERY DAY, Disp: 90 tablet, Rfl: 3   famotidine (PEPCID) 20 MG tablet, TAKE 1 TABLET BY MOUTH TWICE A DAY, Disp: 180 tablet, Rfl: 3   FLUoxetine (PROZAC) 10 MG capsule, Take 10 mg by mouth daily. Takes 2 one time daily, Disp: , Rfl: 2   INGREZZA 60 MG capsule, Take 60 mg by mouth daily., Disp: , Rfl:    linaclotide (LINZESS) 145 MCG CAPS capsule, Take 1 capsule (145 mcg total) by mouth daily as needed., Disp: 30 capsule, Rfl: 11   multivitamin-iron-minerals-folic acid (CENTRUM) chewable tablet, Chew 1 tablet by mouth daily., Disp: , Rfl:    MYRBETRIQ 50 MG TB24 tablet, TAKE 1 TABLET BY MOUTH EVERY DAY, Disp: 90 tablet, Rfl: 3   Omega-3 Fatty Acids (FISH OIL) 1000 MG CAPS, Take 2 capsules by mouth daily., Disp: , Rfl:    pravastatin (PRAVACHOL) 40 MG tablet, TAKE 1 TABLET BY MOUTH EVERY DAY, Disp: 90 tablet, Rfl: 3    triamcinolone cream (KENALOG) 0.1 %, APPLY TO AFFECTED AREA TWICE A DAY, Disp: 30 g, Rfl: 1   trimethoprim (TRIMPEX) 100 MG tablet, Take 1 tablet (100 mg total) by mouth daily., Disp: 90 tablet, Rfl: 3   zolpidem (AMBIEN) 5 MG tablet, Take 1 tablet (5 mg total) by mouth at bedtime as needed for sleep., Disp: 30 tablet, Rfl: 5  Review of Symptoms: Complete 10-system review is positive for: Anxiety, incontinence, problem walking  Physical Exam: BP 118/70 Comment: manual recheck/MD notified  Pulse 72   Temp 98.4 F (36.9 C) (Oral)   Resp 20   Wt 169 lb 3.2 oz (76.7 kg)   SpO2 98%   BMI 29.97 kg/m  General: Alert, oriented, no acute distress.   Laboratory & Radiologic Studies: Surgical pathology (03/26/23): A. ANTERIOR RIGHT CERVICAL MASS, BIOPSY:       Invasive squamous cell carcinoma, well-differentiated,  keratinizing.   NM PET Image Initial (PI) Skull Base To Thigh (F-18 FDG) 04/05/2023  Narrative CLINICAL DATA:  Initial treatment strategy for cervical cancer.  EXAM: NUCLEAR MEDICINE PET SKULL BASE TO THIGH  TECHNIQUE: 8.59 mCi F-18 FDG was injected intravenously. Full-ring PET imaging was performed from the skull base to thigh after the radiotracer. CT data was obtained and used for attenuation correction and anatomic localization.  Fasting blood glucose: 1.04 mg/dl  COMPARISON:  None Available.  FINDINGS: Mediastinal blood pool activity: SUV max 3.1  Liver activity: SUV max NA  NECK: No hypermetabolic lymph nodes in the neck.  Incidental CT findings: None  CHEST: No hypermetabolic mediastinal or hilar nodes. No suspicious pulmonary nodules on the CT scan.  Incidental CT findings: Aortic atherosclerosis. Coronary artery calcifications. Post treatment changes within the periphery of the right mid lung identified. Bilateral, nonspecific ground-glass attenuation identified within both upper lobes.  ABDOMEN/PELVIS: No abnormal hypermetabolic activity within  the liver, pancreas, adrenal glands, or spleen. Right external iliac lymph node measures 0.7 cm with SUV max of 11.62, image 159/4.  Cervical mass measures 4.5 x 2.4 cm with SUV max of 18.02, image 169/4.  No ascites.  No peritoneal nodularity or mass.  Incidental CT findings: Aortic atherosclerosis. Simple right kidney  cysts are noted measuring up to 3.6 cm. No follow-up imaging recommended. Possible exophytic lesion off the upper pole of the left kidney measuring 2 cm, image 1006/4. Difficult to confirm within the limitations of nondiagnostic CT and unenhanced technique. Status post cholecystectomy.  SKELETON: No focal hypermetabolic activity to suggest skeletal metastasis.  Incidental CT findings: None.  IMPRESSION: 1. The cervical mass is hypermetabolic compatible with primary cervical carcinoma. 2. 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 3. No signs of solid organ metastasis. 4. Possible exophytic lesion off the upper pole of the left kidney measuring 2 cm. Difficult to confirm within the limitations of nondiagnostic CT and unenhanced technique. Consider further evaluation with renal ultrasound. 5. Coronary artery calcifications. 6.  Aortic Atherosclerosis (ICD10-I70.0).   Electronically Signed By: Signa Kell M.D. On: 04/11/2023 10:57

## 2023-04-16 NOTE — Progress Notes (Signed)
Gynecologic Oncology Multi-Disciplinary Disposition Conference Note  Date of the Conference: 04/16/2023  Patient Name: Paula [name]  Referring Provider: Dr. Kennith Center Primary GYN Oncologist: Dr. Alvester Morin   Stage/Disposition:  Stage IIIC1 squamous cell carcinoma of the cervix. Disposition is to concurrent chemotherapy and radiation. PD-L1 testing has been requested.   This Multidisciplinary conference took place involving physicians from Gynecologic Oncology, Medical Oncology, Radiation Oncology, Pathology, Radiology along with the Gynecologic Oncology Nurse Practitioner and Gynecologic Oncology Nurse Navigator.  Comprehensive assessment of the patient's malignancy, staging, need for surgery, chemotherapy, radiation therapy, and need for further testing were reviewed. Supportive measures, both inpatient and following discharge were also discussed. The recommended plan of care is documented. Greater than 35 minutes were spent correlating and coordinating this patient's care.

## 2023-04-17 ENCOUNTER — Telehealth: Payer: Self-pay | Admitting: Radiation Oncology

## 2023-04-17 NOTE — Telephone Encounter (Signed)
Called patient to schedule a consultation w. Dr. Kinard. No answer, LVM for a return call.  

## 2023-04-18 ENCOUNTER — Telehealth: Payer: Self-pay | Admitting: Radiation Oncology

## 2023-04-18 ENCOUNTER — Telehealth: Payer: Self-pay | Admitting: Oncology

## 2023-04-18 DIAGNOSIS — S299XXA Unspecified injury of thorax, initial encounter: Secondary | ICD-10-CM | POA: Diagnosis not present

## 2023-04-18 NOTE — Telephone Encounter (Signed)
Called patient to schedule a consultation w. Dr. Roselind Messier. Patient stated she fell yesterday and she's in a lot of pain. Patient wanted to hold off on scheduling consultation, will give [place] a call to schedule after being seen for her pain.

## 2023-04-18 NOTE — Telephone Encounter (Signed)
Paula [name] called and said she fell yesterday.  She fell backwards on the toilet and hit the wall with her side.  She denies hitting her head when she fell. She is now having pain with breathing and feels like she has cracked a rib.  Advised her that she should go to the emergency room or urgent care to be evaluated.  She said she might want to cancel her appointment tomorrow due to the pain but will call this afternoon after being evaluated.

## 2023-04-18 NOTE — Telephone Encounter (Signed)
Paula [name] called back and was seen at urgent care today.  She had x rays and did break a rib.  She was given pain medication and a lidocaine patch and is feeling better.  Reminded her of appointment time with Dr. Bertis Ruddy tomorrow.  Paula [name] also requested to schedule a consult appointment with Dr. Roselind Messier. Nurse eval scheduled for 04/26/23 at 2:30 followed by consult with Dr. Roselind Messier at 3:00.  She verbalized understanding and agreement.

## 2023-04-19 ENCOUNTER — Encounter: Payer: Self-pay | Admitting: Hematology and Oncology

## 2023-04-19 ENCOUNTER — Inpatient Hospital Stay (HOSPITAL_BASED_OUTPATIENT_CLINIC_OR_DEPARTMENT_OTHER): Payer: Medicare Other | Admitting: Hematology and Oncology

## 2023-04-19 VITALS — BP 146/68 | HR 80 | Temp 97.7°F | Resp 18 | Ht 63.0 in | Wt 170.6 lb

## 2023-04-19 DIAGNOSIS — E1122 Type 2 diabetes mellitus with diabetic chronic kidney disease: Secondary | ICD-10-CM | POA: Diagnosis not present

## 2023-04-19 DIAGNOSIS — N189 Chronic kidney disease, unspecified: Secondary | ICD-10-CM | POA: Diagnosis not present

## 2023-04-19 DIAGNOSIS — C539 Malignant neoplasm of cervix uteri, unspecified: Secondary | ICD-10-CM

## 2023-04-19 DIAGNOSIS — F1020 Alcohol dependence, uncomplicated: Secondary | ICD-10-CM | POA: Insufficient documentation

## 2023-04-19 DIAGNOSIS — N1832 Chronic kidney disease, stage 3b: Secondary | ICD-10-CM | POA: Diagnosis not present

## 2023-04-19 DIAGNOSIS — R296 Repeated falls: Secondary | ICD-10-CM | POA: Diagnosis not present

## 2023-04-19 DIAGNOSIS — C775 Secondary and unspecified malignant neoplasm of intrapelvic lymph nodes: Secondary | ICD-10-CM | POA: Diagnosis not present

## 2023-04-19 DIAGNOSIS — I129 Hypertensive chronic kidney disease with stage 1 through stage 4 chronic kidney disease, or unspecified chronic kidney disease: Secondary | ICD-10-CM | POA: Diagnosis not present

## 2023-04-19 MED ORDER — LIDOCAINE-PRILOCAINE 2.5-2.5 % EX CREA: 1.0000 | TOPICAL_CREAM | CUTANEOUS | 3 refills | Status: DC | PRN

## 2023-04-19 MED ORDER — ONDANSETRON HCL 8 MG PO TABS: 8.0000 mg | ORAL_TABLET | Freq: Three times a day (TID) | ORAL | 3 refills | Status: DC | PRN

## 2023-04-19 MED ORDER — PROCHLORPERAZINE MALEATE 10 MG PO TABS: 10.0000 mg | ORAL_TABLET | Freq: Four times a day (QID) | ORAL | 3 refills | Status: DC | PRN

## 2023-04-19 NOTE — Assessment & Plan Note (Signed)
She has chronic alcohol use We discussed importance of staying abstinent while on treatment

## 2023-04-19 NOTE — Assessment & Plan Note (Signed)
We discussed the role of concurrent chemoradiation therapy Test for PD-L1 is still pending; if test is positive, she will qualify for addition of immunotherapy She is scheduled to see radiation oncologist next week We discussed risk, benefits, side effects of cisplatin She would need upfront dose reduction due to chronic kidney disease and mild baseline hearing impairment I will follow-up on PD-L1 test before scheduling chemotherapy I will schedule chemo education class and port placement

## 2023-04-19 NOTE — Progress Notes (Signed)
[facility] CONSULT NOTE  Patient Care Team: Corwin Levins, MD as PCP - General (Internal Medicine) Janet Berlin, MD as Consulting Physician (Ophthalmology) Rosalyn Gess, MD as Consulting Physician (Gynecology)  ASSESSMENT & PLAN:  Malignant neoplasm of cervix [facility]) We discussed the role of concurrent chemoradiation therapy Test for PD-L1 is still pending; if test is positive, she will qualify for addition of immunotherapy She is scheduled to see radiation oncologist next week We discussed risk, benefits, side effects of cisplatin She would need upfront dose reduction due to chronic kidney disease and mild baseline hearing impairment I will follow-up on PD-L1 test before scheduling chemotherapy I will schedule chemo education class and port placement  CKD (chronic kidney disease) stage 3, GFR 30-59 ml/min (HCC) She will need upfront dose reduction due to baseline poor renal function  Recurrent falls She has recurrent falls Her recent fall was backwards and she has noticed to have baseline resting tremor I will refer to neuro oncologist for evaluation I will refer her to physical therapy for evaluation  Chronic alcoholism (HCC) She has chronic alcohol use We discussed importance of staying abstinent while on treatment  Orders Placed This Encounter  Procedures   IR IMAGING GUIDED PORT INSERTION    Standing Status:   Future    Standing Expiration Date:   04/18/2024    Order Specific Question:   Reason for Exam (SYMPTOM  OR DIAGNOSIS REQUIRED)    Answer:   need port for chemo    Order Specific Question:   Preferred Imaging Location?    Answer:   [facility]   Amb Referral to Neuro Oncology    Referral Priority:   Routine    Referral Type:   Consultation    Referral Reason:   Specialty Services Required    Requested Specialty:   Oncology    Number of Visits Requested:   1   Ambulatory referral to Physical Therapy    Referral Priority:   Routine     Referral Type:   Physical Medicine    Referral Reason:   Specialty Services Required    Requested Specialty:   Physical Therapy    Number of Visits Requested:   1    The total time spent in the appointment was 60 minutes encounter with patients including review of chart and various tests results, discussions about plan of care and coordination of care plan   All questions were answered. The patient knows to call the clinic with any problems, questions or concerns. No barriers to learning was detected.  Artis Delay, MD 11/14/20242:53 PM  CHIEF COMPLAINTS/PURPOSE OF CONSULTATION:  Cervical cancer, for treatment  HISTORY OF PRESENTING ILLNESS:  Paula [name] 77 y.o. female is here because of recent diagnosis of cervical cancer She is here accompanied by her son The patient was diagnosed after presentation with postmenopausal bleeding She denies recent bleeding this week She had a fall several days ago and went to urgent care.  She was prescribed pain medicine.  The patient had recurrent falls over the past few years but recently, her fall is more frequent She started using a cane She had baseline chronic kidney disease and poor hearing  I have reviewed her chart and materials related to her cancer extensively and collaborated history with the patient. Summary of oncologic history is as follows: Oncology History  Malignant neoplasm of cervix (HCC)  03/08/2023 Initial Diagnosis   Presented to OB/GYN for postmenopausal bleeding. Pap with HSIL, HPV  16 positive   03/26/2023 Initial Biopsy   Cervical biopsy: A. ANTERIOR RIGHT CERVICAL MASS, BIOPSY:       Invasive squamous cell carcinoma, well-differentiated,  keratinizing.    04/05/2023 Cancer Staging   Staging form: Cervix Uteri, AJCC Version 9 - Clinical stage from 04/05/2023: FIGO Stage IIIC1r, calculated as Stage IIIC1 (cT2b, cN1, cM0) - Signed by Clide Cliff, MD on 04/16/2023 Histopathologic type: Squamous cell  carcinoma, NOS Stage prefix: Initial diagnosis Stage confirmation method: Imaging Pelvic nodal status: Positive Pelvic nodal method of assessment: PET Para-aortic status: Negative Para-aortic nodal method of assessment: PET Histologic grade (G): G1 Histologic grading system: 3 grade system   04/05/2023 Imaging   PET: IMPRESSION: 1. The cervical mass is hypermetabolic compatible with primary cervical carcinoma. 2. 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 3. No signs of solid organ metastasis. 4. Possible exophytic lesion off the upper pole of the left kidney measuring 2 cm. Difficult to confirm within the limitations of nondiagnostic CT and unenhanced technique. Consider further evaluation with renal ultrasound. 5. Coronary artery calcifications. 6.  Aortic Atherosclerosis (ICD10-I70.0).     MEDICAL HISTORY:  Past Medical History:  Diagnosis Date   Allergy    Arthritis    Bipolar 1 disorder (HCC)    CKD (chronic kidney disease)    Colon polyps    COPD (chronic obstructive pulmonary disease) (HCC)    Depression    Diabetes mellitus without complication (HCC)    GERD (gastroesophageal reflux disease)    History of alcohol abuse    History of chicken pox    History of drug abuse (HCC)    Hyperlipidemia    Hypertension    Mitral valve regurgitation    Urine incontinence     SURGICAL HISTORY: Past Surgical History:  Procedure Laterality Date   APPENDECTOMY     BREAST BIOPSY     CHOLECYSTECTOMY     LUNG BIOPSY     SUBTOTAL COLECTOMY  2005   [facility] [place]    SOCIAL HISTORY: Social History   Socioeconomic History   Marital status: Single    Spouse name: Not on file   Number of children: 1   Years of education: 14   Highest education level: Not on file  Occupational History   Occupation: Retired Charity fundraiser  Tobacco Use   Smoking status: Former    Current packs/day: 0.00    Average packs/day: 1 pack/day for 55.0 years (55.0  ttl pk-yrs)    Types: Cigarettes    Start date: 08/03/1961    Quit date: 08/03/2016    Years since quitting: 6.7   Smokeless tobacco: Never  Vaping Use   Vaping status: Never Used  Substance and Sexual Activity   Alcohol use: Yes    Alcohol/week: 4.0 - 6.0 standard drinks of alcohol    Types: 4 - 6 Cans of beer per week    Comment: Occ   Drug use: Yes    Frequency: 7.0 times per week    Types: Marijuana   Sexual activity: Not Currently    Birth control/protection: Post-menopausal  Other Topics Concern   Not on file  Social History Narrative   Fun/Hobby: Read   Denies abuse and feels safe at home    Social Determinants of Health   Financial Resource Strain: Low Risk  (03/22/2022)   Overall Financial Resource Strain (CARDIA)    Difficulty of Paying Living Expenses: Not hard at all  Food Insecurity: No Food Insecurity (03/22/2023)  Hunger Vital Sign    Worried About Running Out of Food in the Last Year: Never true    Ran Out of Food in the Last Year: Never true  Transportation Needs: No Transportation Needs (03/22/2023)   PRAPARE - Administrator, Civil Service (Medical): No    Lack of Transportation (Non-Medical): No  Physical Activity: Inactive (03/22/2022)   Exercise Vital Sign    Days of Exercise per Week: 0 days    Minutes of Exercise per Session: 0 min  Stress: No Stress Concern Present (03/22/2022)   [employer] of Occupational Health - Occupational Stress Questionnaire    Feeling of Stress : Not at all  Social Connections: Socially Isolated (03/22/2022)   Social Connection and Isolation Panel [NHANES]    Frequency of Communication with Friends and Family: More than three times a week    Frequency of Social Gatherings with Friends and Family: More than three times a week    Attends Religious Services: Never    Database administrator or Organizations: No    Attends Banker Meetings: Never    Marital Status: Divorced  Catering manager  Violence: Not At Risk (03/22/2023)   Humiliation, Afraid, Rape, and Kick questionnaire    Fear of Current or Ex-Partner: No    Emotionally Abused: No    Physically Abused: No    Sexually Abused: No    FAMILY HISTORY: Family History  Problem Relation Age of Onset   Hyperlipidemia Mother    COPD Mother        smoked   Arthritis Father    Heart disease Father    Diabetes Father    Prostate cancer Brother    Heart disease Paternal Grandmother    Heart disease Paternal Uncle    Breast cancer Neg Hx    Ovarian cancer Neg Hx    Colon cancer Neg Hx    Endometrial cancer Neg Hx     ALLERGIES:  is allergic to penicillins.  MEDICATIONS:  Current Outpatient Medications  Medication Sig Dispense Refill   lidocaine-prilocaine (EMLA) cream Apply 1 Application topically as needed. 30 g 3   ondansetron (ZOFRAN) 8 MG tablet Take 1 tablet (8 mg total) by mouth every 8 (eight) hours as needed for nausea. 30 tablet 3   prochlorperazine (COMPAZINE) 10 MG tablet Take 1 tablet (10 mg total) by mouth every 6 (six) hours as needed for nausea or vomiting. 30 tablet 3   aspirin EC 81 MG tablet Take 81 mg by mouth daily.     Cholecalciferol (VITAMIN D3) 2000 units TABS Take by mouth.     divalproex (DEPAKOTE ER) 250 MG 24 hr tablet Take 3 tablets (750 mg total) by mouth every morning. 30 tablet 0   ezetimibe (ZETIA) 10 MG tablet TAKE 1 TABLET BY MOUTH EVERY DAY 90 tablet 3   famotidine (PEPCID) 20 MG tablet TAKE 1 TABLET BY MOUTH TWICE A DAY 180 tablet 3   FLUoxetine (PROZAC) 10 MG capsule Take 10 mg by mouth daily. Takes 2 one time daily  2   INGREZZA 60 MG capsule Take 60 mg by mouth daily.     linaclotide (LINZESS) 145 MCG CAPS capsule Take 1 capsule (145 mcg total) by mouth daily as needed. 30 capsule 11   multivitamin-iron-minerals-folic acid (CENTRUM) chewable tablet Chew 1 tablet by mouth daily.     MYRBETRIQ 50 MG TB24 tablet TAKE 1 TABLET BY MOUTH EVERY DAY 90 tablet 3   Omega-3 Fatty  Acids  (FISH OIL) 1000 MG CAPS Take 2 capsules by mouth daily.     pravastatin (PRAVACHOL) 40 MG tablet TAKE 1 TABLET BY MOUTH EVERY DAY 90 tablet 3   triamcinolone cream (KENALOG) 0.1 % APPLY TO AFFECTED AREA TWICE A DAY 30 g 1   trimethoprim (TRIMPEX) 100 MG tablet Take 1 tablet (100 mg total) by mouth daily. 90 tablet 3   zolpidem (AMBIEN) 5 MG tablet Take 1 tablet (5 mg total) by mouth at bedtime as needed for sleep. 30 tablet 5   No current facility-administered medications for this visit.    REVIEW OF SYSTEMS:   Constitutional: Denies fevers, chills or abnormal night sweats Eyes: Denies blurriness of vision, double vision or watery eyes Ears, nose, mouth, throat, and face: Denies mucositis or sore throat Respiratory: Denies cough, dyspnea or wheezes Cardiovascular: Denies palpitation, chest discomfort or lower extremity swelling Gastrointestinal:  Denies nausea, heartburn or change in bowel habits Skin: Denies abnormal skin rashes Lymphatics: Denies new lymphadenopathy or easy bruising Neurological:Denies numbness, tingling or new weaknesses Behavioral/Psych: Mood is stable, no new changes  All other systems were reviewed with the patient and are negative.  PHYSICAL EXAMINATION: ECOG PERFORMANCE STATUS: 1 - Symptomatic but completely ambulatory  Vitals:   04/19/23 1339  BP: (!) 146/68  Pulse: 80  Resp: 18  Temp: 97.7 F (36.5 C)  SpO2: 93%   Filed Weights   04/19/23 1339  Weight: 170 lb 9.6 oz (77.4 kg)    GENERAL:alert, no distress and comfortable SKIN: skin color, texture, turgor are normal, no rashes or significant lesions.  Noted minor skin bruising from her recent fall EYES: normal, conjunctiva are pink and non-injected, sclera clear OROPHARYNX:no exudate, no erythema and lips, buccal mucosa, and tongue normal  NECK: supple, thyroid normal size, non-tender, without nodularity LYMPH:  no palpable lymphadenopathy in the cervical, axillary or inguinal LUNGS: clear to  auscultation and percussion with normal breathing effort HEART: regular rate & rhythm and no murmurs and no lower extremity edema ABDOMEN:abdomen soft, non-tender and normal bowel sounds Musculoskeletal:no cyanosis of digits and no clubbing  PSYCH: alert & oriented x 3 with fluent speech NEURO: no focal motor/sensory deficits  LABORATORY DATA:  I have reviewed the data as listed Lab Results  Component Value Date   WBC 6.5 12/18/2022   HGB 14.1 12/18/2022   HCT 43.3 12/18/2022   MCV 101.4 (H) 12/18/2022   PLT 225.0 12/18/2022   Recent Labs    06/19/22 1114 12/18/22 1125  NA 137 139  K 4.3 5.1  CL 103 105  CO2 27 26  GLUCOSE 104* 99  BUN 29* 23  CREATININE 1.42* 1.59*  CALCIUM 9.6 10.7*  PROT 7.1 7.4  ALBUMIN 3.9 4.2  AST 20 23  ALT 15 21  ALKPHOS 65 64  BILITOT 0.3 0.4  BILIDIR 0.1 0.1    RADIOGRAPHIC STUDIES: I have personally reviewed the radiological images as listed and agreed with the findings in the report. NM PET Image Initial (PI) Skull Base To Thigh (F-18 FDG)  Result Date: 04/11/2023 CLINICAL DATA:  Initial treatment strategy for cervical cancer. EXAM: NUCLEAR MEDICINE PET SKULL BASE TO THIGH TECHNIQUE: 8.59 mCi F-18 FDG was injected intravenously. Full-ring PET imaging was performed from the skull base to thigh after the radiotracer. CT data was obtained and used for attenuation correction and anatomic localization. Fasting blood glucose: 1.04 mg/dl COMPARISON:  None Available. FINDINGS: Mediastinal blood pool activity: SUV max 3.1 Liver activity: SUV max NA NECK:  No hypermetabolic lymph nodes in the neck. Incidental CT findings: None CHEST: No hypermetabolic mediastinal or hilar nodes. No suspicious pulmonary nodules on the CT scan. Incidental CT findings: Aortic atherosclerosis. Coronary artery calcifications. Post treatment changes within the periphery of the right mid lung identified. Bilateral, nonspecific ground-glass attenuation identified within both upper  lobes. ABDOMEN/PELVIS: No abnormal hypermetabolic activity within the liver, pancreas, adrenal glands, or spleen. Right external iliac lymph node measures 0.7 cm with SUV max of 11.62, image 159/4. Cervical mass measures 4.5 x 2.4 cm with SUV max of 18.02, image 169/4. No ascites.  No peritoneal nodularity or mass. Incidental CT findings: Aortic atherosclerosis. Simple right kidney cysts are noted measuring up to 3.6 cm. No follow-up imaging recommended. Possible exophytic lesion off the upper pole of the left kidney measuring 2 cm, image 1006/4. Difficult to confirm within the limitations of nondiagnostic CT and unenhanced technique. Status post cholecystectomy. SKELETON: No focal hypermetabolic activity to suggest skeletal metastasis. Incidental CT findings: None. IMPRESSION: 1. The cervical mass is hypermetabolic compatible with primary cervical carcinoma. 2. 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 3. No signs of solid organ metastasis. 4. Possible exophytic lesion off the upper pole of the left kidney measuring 2 cm. Difficult to confirm within the limitations of nondiagnostic CT and unenhanced technique. Consider further evaluation with renal ultrasound. 5. Coronary artery calcifications. 6.  Aortic Atherosclerosis (ICD10-I70.0). Electronically Signed   By: Signa Kell M.D.   On: 04/11/2023 10:57

## 2023-04-19 NOTE — Assessment & Plan Note (Signed)
She has recurrent falls Her recent fall was backwards and she has noticed to have baseline resting tremor I will refer to neuro oncologist for evaluation I will refer her to physical therapy for evaluation

## 2023-04-19 NOTE — Assessment & Plan Note (Signed)
She will need upfront dose reduction due to baseline poor renal function

## 2023-04-20 NOTE — Progress Notes (Incomplete)
GYN Location of Tumor / Histology: Cervical  Paula [name] presented with symptoms [id number] menopausal bleeding.  Biopsies revealed:    Past/Anticipated interventions by Gyn/Onc surgery, if any:    Past/Anticipated interventions by medical oncology, if any:     Weight changes, if any: Yes  Bowel/Bladder complaints, if any: Yes, constipation  Nausea/Vomiting, if any: No  Pain issues, if any:  Yes, she reports lower back pain from a fall on yesterday.  SAFETY ISSUES: Prior radiation? No Pacemaker/ICD? No Possible current pregnancy? No Is the patient on methotrexate? No   BP 133/64   Pulse 72   Temp 97.7 F (36.5 C)   Resp 16   Wt 169 lb (76.7 kg)   SpO2 96%   BMI 29.94 kg/m

## 2023-04-24 ENCOUNTER — Telehealth: Payer: Self-pay | Admitting: Oncology

## 2023-04-24 LAB — MOLECULAR PATHOLOGY

## 2023-04-24 NOTE — Telephone Encounter (Signed)
Paula [name] called and wanted to review her upcoming appointments.  Went over all upcoming appointments.  Paula [name] also said she thinks someone left a message about physical therapy.  She has their number and will call to schedule.

## 2023-04-25 ENCOUNTER — Encounter: Payer: Self-pay | Admitting: Hematology and Oncology

## 2023-04-25 ENCOUNTER — Ambulatory Visit (HOSPITAL_COMMUNITY)
Admission: RE | Admit: 2023-04-25 | Discharge: 2023-04-25 | Disposition: A | Discharge status: Home / Self Care | Payer: Medicare Other | Source: Ambulatory Visit | Attending: Psychiatry | Admitting: Psychiatry

## 2023-04-25 ENCOUNTER — Telehealth: Payer: Self-pay | Admitting: Oncology

## 2023-04-25 ENCOUNTER — Other Ambulatory Visit: Payer: Self-pay | Admitting: Hematology and Oncology

## 2023-04-25 DIAGNOSIS — D3502 Benign neoplasm of left adrenal gland: Secondary | ICD-10-CM | POA: Diagnosis not present

## 2023-04-25 DIAGNOSIS — I700 Atherosclerosis of aorta: Secondary | ICD-10-CM | POA: Diagnosis not present

## 2023-04-25 DIAGNOSIS — C539 Malignant neoplasm of cervix uteri, unspecified: Secondary | ICD-10-CM

## 2023-04-25 DIAGNOSIS — N2889 Other specified disorders of kidney and ureter: Secondary | ICD-10-CM | POA: Insufficient documentation

## 2023-04-25 DIAGNOSIS — N289 Disorder of kidney and ureter, unspecified: Secondary | ICD-10-CM | POA: Diagnosis not present

## 2023-04-25 MED ORDER — GADOBUTROL 1 MMOL/ML IV SOLN
8.0000 mL | Freq: Once | INTRAVENOUS | Status: AC | PRN
  Administered 2023-04-25: 8 mL via INTRAVENOUS

## 2023-04-25 NOTE — Telephone Encounter (Signed)
Called Paula [name] and discussed starting chemo on 05/09/23.  She is ok with that date.  Advised her a scheduler will call her with the time.

## 2023-04-25 NOTE — Progress Notes (Signed)
START OFF PATHWAY REGIMEN - Other   [id number]:Cisplatin 35 mg/m2 IV D1 q7 Days + RT:   A cycle is every 7 days, concurrent with RT:     Cisplatin   **Always confirm dose/schedule in your pharmacy ordering system**  Patient Characteristics: Intent of Therapy: Curative Intent, Discussed with Patient

## 2023-04-25 NOTE — Telephone Encounter (Signed)
Left a message regarding starting chemo on 05/09/23.  Requested a return call.

## 2023-04-26 ENCOUNTER — Ambulatory Visit
Admission: RE | Admit: 2023-04-26 | Discharge: 2023-04-26 | Disposition: A | Discharge status: Home / Self Care | Payer: Medicare Other | Source: Ambulatory Visit | Attending: Radiation Oncology | Admitting: Radiation Oncology

## 2023-04-26 ENCOUNTER — Other Ambulatory Visit: Payer: Self-pay

## 2023-04-26 ENCOUNTER — Encounter: Payer: Self-pay | Admitting: Radiation Oncology

## 2023-04-26 ENCOUNTER — Encounter: Payer: Self-pay | Admitting: Oncology

## 2023-04-26 ENCOUNTER — Inpatient Hospital Stay: Payer: Medicare Other

## 2023-04-26 VITALS — BP 133/64 | HR 72 | Temp 97.7°F | Resp 16 | Wt 169.0 lb

## 2023-04-26 VITALS — BP 133/64 | HR 72 | Temp 97.7°F | Resp 16 | Ht 63.0 in | Wt 169.0 lb

## 2023-04-26 DIAGNOSIS — N189 Chronic kidney disease, unspecified: Secondary | ICD-10-CM | POA: Diagnosis not present

## 2023-04-26 DIAGNOSIS — Z8042 Family history of malignant neoplasm of prostate: Secondary | ICD-10-CM | POA: Insufficient documentation

## 2023-04-26 DIAGNOSIS — Z7982 Long term (current) use of aspirin: Secondary | ICD-10-CM | POA: Diagnosis not present

## 2023-04-26 DIAGNOSIS — C530 Malignant neoplasm of endocervix: Secondary | ICD-10-CM

## 2023-04-26 DIAGNOSIS — F319 Bipolar disorder, unspecified: Secondary | ICD-10-CM | POA: Insufficient documentation

## 2023-04-26 DIAGNOSIS — K219 Gastro-esophageal reflux disease without esophagitis: Secondary | ICD-10-CM | POA: Diagnosis not present

## 2023-04-26 DIAGNOSIS — I129 Hypertensive chronic kidney disease with stage 1 through stage 4 chronic kidney disease, or unspecified chronic kidney disease: Secondary | ICD-10-CM | POA: Diagnosis not present

## 2023-04-26 DIAGNOSIS — Z79899 Other long term (current) drug therapy: Secondary | ICD-10-CM | POA: Insufficient documentation

## 2023-04-26 DIAGNOSIS — Z87891 Personal history of nicotine dependence: Secondary | ICD-10-CM | POA: Diagnosis not present

## 2023-04-26 DIAGNOSIS — J449 Chronic obstructive pulmonary disease, unspecified: Secondary | ICD-10-CM | POA: Diagnosis not present

## 2023-04-26 DIAGNOSIS — C539 Malignant neoplasm of cervix uteri, unspecified: Secondary | ICD-10-CM | POA: Diagnosis present

## 2023-04-26 DIAGNOSIS — E1122 Type 2 diabetes mellitus with diabetic chronic kidney disease: Secondary | ICD-10-CM | POA: Diagnosis not present

## 2023-04-26 DIAGNOSIS — E785 Hyperlipidemia, unspecified: Secondary | ICD-10-CM | POA: Diagnosis not present

## 2023-04-26 DIAGNOSIS — K5900 Constipation, unspecified: Secondary | ICD-10-CM | POA: Insufficient documentation

## 2023-04-26 DIAGNOSIS — I700 Atherosclerosis of aorta: Secondary | ICD-10-CM | POA: Diagnosis not present

## 2023-04-26 DIAGNOSIS — R87820 Cervical low risk human papillomavirus (HPV) DNA test positive: Secondary | ICD-10-CM | POA: Diagnosis not present

## 2023-04-26 NOTE — Progress Notes (Signed)
Paula [name] a printed calendar with her upcoming chemotherapy appointments and appointments for lab and to see Dr. Bertis Ruddy on 05/08/23.  Discussed that we will be adding on appointments for labs and to see Dr. Bertis Ruddy weekly and that we will let her know about the appointment times.

## 2023-04-26 NOTE — Progress Notes (Signed)
Radiation Oncology         [phone] ________________________________  Initial Outpatient Consultation  Name: Paula [name] MRN: [id number]  Date: 04/26/2023  DOB: [birth year]  NW:GNFA, Len Blalock, MD  Clide Cliff, MD   REFERRING PHYSICIAN: Clide Cliff, MD  DIAGNOSIS: The encounter diagnosis was Malignant neoplasm of endocervix [facility]).  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 female who is accompanied by her supportive son. she is seen as a courtesy of Dr. Alvester Morin for an opinion concerning radiation therapy as part of management for her recently diagnosed cervical cancer.   The patient presented to her PCP this past September with c/o postmenopausal bleeding which lasted for 10 days starting on 02/18/23. She subsequently presented or a pelvic US on 02/28/23 which showed: an endometrial thickness of 3.3 mm, and a 9 mm echogenic mass within the fundal endometrial canal. Several large cervical cysts were also demonstrated, as well as a 11 mm hypoechoic solid area in the left cervix. The ovaries were not visualized.   She accordingly met with her gynecologist, Dr. Kennith Center on 03/08/23 for further evaluation. GU exam performed during that time noted friable tissue, bleeding upon speculum placement which appeared to be coming from the cervix, and a palpable cervical nodularity. 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  She was then referred to Dr. Alvester Morin on 03/26/23 for further evaluation and management. GU exam performed at that time again noted friable hyperglandular abnormal tissue at the vaginal apex replacing the  cervix concerning for a cervical cancer.  Additionally, evidence of potential parametrial involvement was noted. A cervical biopsy was obtained during that time which showed findings consistent with invasive squamous cell carcinoma; well differentiated; keratinizing.   For further staging work-up, she presented for a PET scan on 04/05/23 which showed hypermetabolism associated with the primary cervical carcinoma, and evidence of a hypermetabolic right iliac lymph node, compatible with nodal metastatic disease. No definite signs of solid organ metastatic disease was demonstrated, however, a possible exophytic lesion off the upper pole of the left kidney measuring 2 cm was noted which warrants further evaluation (detailed below).   Her case was presented at the gyn-onc tumor board on 04/16/23. Disposition concluded is to concurrent chemotherapy and radiation.   She has since been seen in consultation by Dr. Bertis Ruddy on 04/19/23 and she is agreeable to try neoadjuvant chemotherapy consisting of cisplatin. She will need an upfront dose reduction due to her chronic kidney disease and mild baseline hearing impairment. PD-L1 testing was ordered which showed a CPS score of <1% (Keytruda).   To further evaluate the indeterminate renal left renal lesion demonstrated on her PET scan, she had an MRI of the abdomen performed on 04/25/23 which showed a benign hemorrhagic or proteinaceous cyst arising from the posterior superior pole of the left kidney without an associated solid component or contrast enhancement. Multiple additional fluid signal renal cortical cysts as well as innumerable very tiny subcentimeter fluid signal lesions were also demonstrated, most likely representing additional tiny cysts.  Patient is doing well overall today. She denies any vaginal bleeding or abdominal pain, but notes some some constipation. She denies any  nausea or vomiting.  PREVIOUS RADIATION THERAPY: No  PAST MEDICAL HISTORY:   Past Medical History:  Diagnosis Date   Allergy    Arthritis    Bipolar 1 disorder (HCC)    CKD (chronic kidney disease)    Colon polyps    COPD (chronic obstructive pulmonary disease) (HCC)    Depression    Diabetes mellitus without complication (HCC)    GERD (gastroesophageal reflux disease)    History of alcohol abuse    History of chicken pox    History of drug abuse (HCC)    Hyperlipidemia    Hypertension    Mitral valve regurgitation    Urine incontinence     PAST SURGICAL HISTORY: Past Surgical History:  Procedure Laterality Date   APPENDECTOMY     BREAST BIOPSY     CHOLECYSTECTOMY     LUNG BIOPSY     SUBTOTAL COLECTOMY  2005   [facility] [place]    FAMILY HISTORY:  Family History  Problem Relation Age of Onset   Hyperlipidemia Mother    COPD Mother        smoked   Arthritis Father    Heart disease Father    Diabetes Father    Prostate cancer Brother    Heart disease Paternal Grandmother    Heart disease Paternal Uncle    Breast cancer Neg Hx    Ovarian cancer Neg Hx    Colon cancer Neg Hx    Endometrial cancer Neg Hx     SOCIAL HISTORY:  Social History   Tobacco Use   Smoking status: Former    Current packs/day: 0.00    Average packs/day: 1 pack/day for 55.0 years (55.0 ttl pk-yrs)    Types: Cigarettes    Start date: 08/03/1961    Quit date: 08/03/2016    Years since quitting: 6.7   Smokeless tobacco: Never  Vaping Use   Vaping status: Never Used  Substance Use Topics   Alcohol use: Yes    Alcohol/week: 4.0 - 6.0 standard drinks of alcohol    Types: 4 - 6 Cans of beer per week    Comment: Occ   Drug use: Yes    Frequency: 7.0 times per week    Types: Marijuana   Retired Engineer, 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     Cholecalciferol (VITAMIN D3) 2000 units TABS Take by mouth.     divalproex (DEPAKOTE ER) 250 MG 24 hr tablet Take 3 tablets (750 mg total) by mouth every morning. 30 tablet 0   ezetimibe (ZETIA) 10 MG tablet TAKE 1 TABLET BY MOUTH EVERY DAY 90 tablet 3   famotidine (PEPCID) 20 MG tablet TAKE 1 TABLET BY MOUTH TWICE A DAY 180 tablet 3   FLUoxetine (PROZAC) 10 MG capsule Take 10 mg by mouth daily. Takes 2 one time daily  2   INGREZZA 60 MG capsule Take 60 mg by mouth daily.     lidocaine-prilocaine (EMLA) cream Apply 1 Application topically as needed. 30 g 3   linaclotide (LINZESS) 145 MCG CAPS capsule Take 1 capsule (145 mcg total) by mouth daily as needed. 30 capsule 11   multivitamin-iron-minerals-folic acid (  CENTRUM) chewable tablet Chew 1 tablet by mouth daily.     MYRBETRIQ 50 MG TB24 tablet TAKE 1 TABLET BY MOUTH EVERY DAY 90 tablet 3   Omega-3 Fatty Acids (FISH OIL) 1000 MG CAPS Take 2 capsules by mouth daily.     ondansetron (ZOFRAN) 8 MG tablet Take 1 tablet (8 mg total) by mouth every 8 (eight) hours as needed for nausea. 30 tablet 3   pravastatin (PRAVACHOL) 40 MG tablet TAKE 1 TABLET BY MOUTH EVERY DAY 90 tablet 3   prochlorperazine (COMPAZINE) 10 MG tablet Take 1 tablet (10 mg total) by mouth every 6 (six) hours as needed for nausea or vomiting. 30 tablet 3   triamcinolone cream (KENALOG) 0.1 % APPLY TO AFFECTED AREA TWICE A DAY 30 g 1   trimethoprim (TRIMPEX) 100 MG tablet Take 1 tablet (100 mg total) by mouth daily. 90 tablet 3   zolpidem (AMBIEN) 5 MG tablet Take 1 tablet (5 mg total) by mouth at bedtime as needed for sleep. 30 tablet 5   No current facility-administered medications for this encounter.    REVIEW OF SYSTEMS:  Notable for that above.    PHYSICAL EXAM:  weight is 169 lb (76.7 kg). Her temperature is 97.7 F (36.5 C). Her blood pressure is 133/64 and her pulse is 72. Her respiration is 16  and oxygen saturation is 96%.   General: Alert and oriented, in no acute distress HEENT: Head is normocephalic. Extraocular movements are intact.  Neck: Neck is supple, no palpable cervical or supraclavicular lymphadenopathy. Heart: Regular in rate and rhythm with no murmurs, rubs, or gallops. Chest: Clear to auscultation bilaterally, with no rhonchi, wheezes, or rales. Abdomen: Soft, nontender, nondistended, with no rigidity or guarding. Extremities: No cyanosis or edema. Lymphatics: see Neck Exam Skin: No concerning lesions. Musculoskeletal: symmetric strength and muscle tone throughout. Neurologic: Cranial nerves II through XII are grossly intact. No obvious focalities. Speech is fluent. Coordination is intact. Psychiatric: Judgment and insight are intact. Affect is appropriate. Pelvic exam deferred until simulation and planning day  ECOG = 0  0 - Asymptomatic (Fully active, able to carry on all predisease activities without restriction)  1 - Symptomatic but completely ambulatory (Restricted in physically strenuous activity but ambulatory and able to carry out work of a light or sedentary nature. For example, light housework, office work)  2 - Symptomatic, <50% in bed during the day (Ambulatory and capable of all self care but unable to carry out any work activities. Up and about more than 50% of waking hours)  3 - Symptomatic, >50% in bed, but not bedbound (Capable of only limited self-care, confined to bed or chair 50% or more of waking hours)  4 - Bedbound (Completely disabled. Cannot carry on any self-care. Totally confined to bed or chair)  5 - Death   Santiago Glad MM, Creech RH, Tormey DC, et al. [id number]). "Toxicity and response criteria of the [facility]". Am. Evlyn Clines. Oncol. 5 (6): 649-55  LABORATORY DATA:  Lab Results  Component Value Date   WBC 6.5 12/18/2022   HGB 14.1 12/18/2022   HCT 43.3 12/18/2022   MCV 101.4 (H) 12/18/2022   PLT 225.0 12/18/2022    NEUTROABS 3.4 12/18/2022   Lab Results  Component Value Date   NA 139 12/18/2022   K 5.1 12/18/2022   CL 105 12/18/2022   CO2 26 12/18/2022   GLUCOSE 99 12/18/2022   BUN 23 12/18/2022   CREATININE 1.59 (H) 12/18/2022  CALCIUM 10.7 (H) 12/18/2022      RADIOGRAPHY: MR ABDOMEN WWO CONTRAST  Result Date: 04/25/2023 CLINICAL DATA:  Indeterminate left renal lesion identified by PET-CT, cervical cancer EXAM: MRI ABDOMEN WITHOUT AND WITH CONTRAST TECHNIQUE: Multiplanar multisequence MR imaging of the abdomen was performed both before and after the administration of intravenous contrast. CONTRAST:  8mL GADAVIST GADOBUTROL 1 MMOL/ML IV SOLN COMPARISON:  PET-CT, 04/05/2023 FINDINGS: Lower chest: No acute abnormality. Hepatobiliary: No focal liver abnormality is seen. Status post cholecystectomy. No biliary dilatation. Pancreas: Unremarkable. No pancreatic ductal dilatation or surrounding inflammatory changes. Spleen: Normal in size without significant abnormality. Adrenals/Urinary Tract: Definitively benign, macroscopic fat containing left adrenal adenoma, for which no further follow-up or characterization is required (series 5, image 37). Intrinsically T1 hyperintense hemorrhagic or proteinaceous cyst arising from the posterior superior pole of the left kidney without associated solid component or contrast enhancement. Multiple additional fluid signal renal cortical cysts as well as innumerable very tiny subcentimeter fluid signal lesions, most likely additional tiny cysts. No calculi or hydronephrosis. Stomach/Bowel: Stomach is within normal limits. No evidence of bowel wall thickening, distention, or inflammatory changes. Vascular/Lymphatic: Aortic atherosclerosis. No enlarged abdominal lymph nodes. Other: No abdominal wall hernia or abnormality. No ascites. Musculoskeletal: No acute or significant osseous findings. IMPRESSION: 1. Benign hemorrhagic or proteinaceous cyst arising from the posterior  superior pole of the left kidney without associated solid component or contrast enhancement. 2. Multiple additional fluid signal renal cortical cysts as well as innumerable very tiny subcentimeter fluid signal lesions, most likely additional tiny cysts. No specific follow-up or characterization is required for these benign Bosniak category I and II cysts. 3. Status post cholecystectomy. Aortic Atherosclerosis (ICD10-I70.0). Electronically Signed   By: Jearld Lesch M.D.   On: 04/25/2023 17:41   NM PET Image Initial (PI) Skull Base To Thigh (F-18 FDG)  Result Date: 04/11/2023 CLINICAL DATA:  Initial treatment strategy for cervical cancer. EXAM: NUCLEAR MEDICINE PET SKULL BASE TO THIGH TECHNIQUE: 8.59 mCi F-18 FDG was injected intravenously. Full-ring PET imaging was performed from the skull base to thigh after the radiotracer. CT data was obtained and used for attenuation correction and anatomic localization. Fasting blood glucose: 1.04 mg/dl COMPARISON:  None Available. FINDINGS: Mediastinal blood pool activity: SUV max 3.1 Liver activity: SUV max NA NECK: No hypermetabolic lymph nodes in the neck. Incidental CT findings: None CHEST: No hypermetabolic mediastinal or hilar nodes. No suspicious pulmonary nodules on the CT scan. Incidental CT findings: Aortic atherosclerosis. Coronary artery calcifications. Post treatment changes within the periphery of the right mid lung identified. Bilateral, nonspecific ground-glass attenuation identified within both upper lobes. ABDOMEN/PELVIS: No abnormal hypermetabolic activity within the liver, pancreas, adrenal glands, or spleen. Right external iliac lymph node measures 0.7 cm with SUV max of 11.62, image 159/4. Cervical mass measures 4.5 x 2.4 cm with SUV max of 18.02, image 169/4. No ascites.  No peritoneal nodularity or mass. Incidental CT findings: Aortic atherosclerosis. Simple right kidney cysts are noted measuring up to 3.6 cm. No follow-up imaging recommended.  Possible exophytic lesion off the upper pole of the left kidney measuring 2 cm, image 1006/4. Difficult to confirm within the limitations of nondiagnostic CT and unenhanced technique. Status post cholecystectomy. SKELETON: No focal hypermetabolic activity to suggest skeletal metastasis. Incidental CT findings: None. IMPRESSION: 1. The cervical mass is hypermetabolic compatible with primary cervical carcinoma. 2. 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 3. No signs of solid organ metastasis. 4. Possible exophytic lesion off the  upper pole of the left kidney measuring 2 cm. Difficult to confirm within the limitations of nondiagnostic CT and unenhanced technique. Consider further evaluation with renal ultrasound. 5. Coronary artery calcifications. 6.  Aortic Atherosclerosis (ICD10-I70.0). Electronically Signed   By: Signa Kell M.D.   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 Given her stage IIIC1r disease, she is a good candidate for concurrent chemoradiation followed by 5 fractions of brachytherapy.   Today, I talked to the patient and family about the findings and work-up thus far.  We discussed the natural history of cervical cancer and general treatment, highlighting the role of radiotherapy in the management.  We discussed the available radiation techniques, and focused on the details of logistics and delivery.  We reviewed the anticipated acute and late sequelae associated with radiation in this setting.  The patient was encouraged to ask questions that I answered to the best of my ability. A patient consent form was discussed and signed.  We retained a copy for our records.  The patient would like to proceed with radiation and will be scheduled for CT simulation.  The patient's PD-L1 testing showed a combined positive score of less than 1%.  She  therefore will not receive immunotherapy as part of her treatment.  PLAN: Patient is scheduled for CT simulation on 05/02/23. Her first chemo infusion is scheduled for 05/09/23. Anticipate 30 fractions of external beam radiation followed by 5 fractions of brachytherapy using Iridium-192 as the high-dose-rate source.     60 minutes of total time was spent for this patient encounter, including preparation, face-to-face counseling with the patient and coordination of care, physical exam, and documentation of the encounter.   ------------------------------------------------   Joyice Faster, PA-C   Billie Lade, PhD, MD   [facility] - [facility]  Radiation Oncology Direct Dial: [phone]  Fax: [phone] [email or web address]    This document serves as a record of services personally performed by Antony Blackbird, MD and Bryan Lemma, PA-C. It was created on his behalf by Neena Rhymes, a trained medical scribe. The creation of this record is based on the scribe's personal observations and the provider's statements to them. This document has been checked and approved by the attending provider.

## 2023-04-27 ENCOUNTER — Telehealth: Payer: Self-pay | Admitting: *Deleted

## 2023-04-27 ENCOUNTER — Other Ambulatory Visit: Payer: Self-pay | Admitting: Urology

## 2023-04-27 NOTE — Telephone Encounter (Signed)
CALLED PATIENT TO INFORM OF APPT. WITH DR. KINARD ON 05-02-23- ARRIVAL TIME- 8:15 AM, SPOKE WITH PATIENT AND SHE IS AWARE OF THIS APPT.

## 2023-04-27 NOTE — H&P (Signed)
Chief Complaint: Patient was seen in consultation today for Port-A-Cath placement for chemotherapy management of cervical cancer, at the request of Gorsuch,[place]  Supervising Physician: Richarda Overlie  Patient Status: [facility] - Out-pt  History of Present Illness: Paula [name] is a 77 y.o. female   [...] Code status per patient.  Per Dr. Trina Ao not on 11/21: The patient presented to her PCP this past September with c/o postmenopausal bleeding which lasted for 10 days starting on 02/18/23. She subsequently presented or a pelvic US on 02/28/23 which showed: an endometrial thickness of 3.3 mm, and a 9 mm echogenic mass within the fundal endometrial canal.    She accordingly met with her gynecologist, Dr. Kennith Center on 03/08/23 for further evaluation.  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   She was then referred to Dr. Alvester Morin on 03/26/23 for further evaluation and management.A cervical biopsy was obtained during that time which showed findings consistent with invasive squamous cell carcinoma; well differentiated; keratinizing.    For further staging work-up, she presented for a PET scan on 04/05/23 which showed hypermetabolism associated with the primary cervical carcinoma, and evidence of a hypermetabolic right iliac lymph node, compatible with nodal metastatic disease. No definite signs of solid organ metastatic disease was demonstrated, however, a possible exophytic lesion off the upper pole of the left kidney measuring 2 cm was noted which warrants further evaluation (detailed below).   To further evaluate the indeterminate renal left renal lesion demonstrated on her PET scan, she had an MRI of the abdomen performed on 04/25/23 which showed a benign hemorrhagic or proteinaceous cyst arising from the posterior superior pole of the left kidney without an associated solid component or contrast enhancement.    Her case was presented at the gyn-onc tumor board on  04/16/23. Disposition concluded is to concurrent chemotherapy and radiation. She has since been seen in consultation by Dr. Bertis Ruddy on 04/19/23 and she is agreeable to try neoadjuvant chemotherapy consisting of cisplatin.    IR was consulted for Port-A-Cath placement for chemotherapy administration. Patient is scheduled for same in IR today.    Past Medical History:  Diagnosis Date   Allergy    Arthritis    Bipolar 1 disorder (HCC)    CKD (chronic kidney disease)    Colon polyps    COPD (chronic obstructive pulmonary disease) (HCC)    Depression    Diabetes mellitus without complication (HCC)    GERD (gastroesophageal reflux disease)    History of alcohol abuse    History of chicken pox    History of drug abuse (HCC)    Hyperlipidemia    Hypertension    Mitral valve regurgitation    Urine incontinence     Past Surgical History:  Procedure Laterality Date   APPENDECTOMY     BREAST BIOPSY     CHOLECYSTECTOMY     LUNG BIOPSY     SUBTOTAL COLECTOMY  2005   [facility] [place]    Allergies: Penicillins  Medications: Prior to Admission medications   Medication Sig Start Date End Date Taking? Authorizing Provider  aspirin EC 81 MG tablet Take 81 mg by mouth daily.    [provider]  Cholecalciferol (VITAMIN D3) 2000 units TABS Take by mouth.    [provider]  divalproex (DEPAKOTE ER) 250 MG 24 hr tablet Take 3 tablets (750 mg total) by mouth every morning. 03/09/22   Corwin Levins, MD  ezetimibe (ZETIA) 10 MG tablet  TAKE 1 TABLET BY MOUTH EVERY DAY 03/15/23   Corwin Levins, MD  famotidine (PEPCID) 20 MG tablet TAKE 1 TABLET BY MOUTH TWICE A DAY 03/15/23   Corwin Levins, MD  FLUoxetine (PROZAC) 10 MG capsule Take 10 mg by mouth daily. Takes 2 one time daily 02/25/18   [provider]  INGREZZA 60 MG capsule Take 60 mg by mouth daily. 12/28/22   [provider]  lidocaine-prilocaine (EMLA) cream Apply 1 Application topically as needed. 04/19/23    Artis Delay, MD  linaclotide (LINZESS) 145 MCG CAPS capsule Take 1 capsule (145 mcg total) by mouth daily as needed. 12/18/22   Corwin Levins, MD  multivitamin-iron-minerals-folic acid (CENTRUM) chewable tablet Chew 1 tablet by mouth daily.    [provider]  MYRBETRIQ 50 MG TB24 tablet TAKE 1 TABLET BY MOUTH EVERY DAY 03/09/23   Corwin Levins, MD  Omega-3 Fatty Acids (FISH OIL) 1000 MG CAPS Take 2 capsules by mouth daily.    [provider]  ondansetron (ZOFRAN) 8 MG tablet Take 1 tablet (8 mg total) by mouth every 8 (eight) hours as needed for nausea. 04/19/23   Artis Delay, MD  pravastatin (PRAVACHOL) 40 MG tablet TAKE 1 TABLET BY MOUTH EVERY DAY 03/15/23   Corwin Levins, MD  prochlorperazine (COMPAZINE) 10 MG tablet Take 1 tablet (10 mg total) by mouth every 6 (six) hours as needed for nausea or vomiting. 04/19/23   Artis Delay, MD  triamcinolone cream (KENALOG) 0.1 % APPLY TO AFFECTED AREA TWICE A DAY 05/10/21   Corwin Levins, MD  trimethoprim (TRIMPEX) 100 MG tablet Take 1 tablet (100 mg total) by mouth daily. 09/08/22   Corwin Levins, MD  zolpidem (AMBIEN) 5 MG tablet Take 1 tablet (5 mg total) by mouth at bedtime as needed for sleep. 12/18/22   Corwin Levins, MD     Family History  Problem Relation Age of Onset   Hyperlipidemia Mother    COPD Mother        smoked   Arthritis Father    Heart disease Father    Diabetes Father    Prostate cancer Brother    Heart disease Paternal Grandmother    Heart disease Paternal Uncle    Breast cancer Neg Hx    Ovarian cancer Neg Hx    Colon cancer Neg Hx    Endometrial cancer Neg Hx     Social History   Socioeconomic History   Marital status: Single    Spouse name: Not on file   Number of children: 1   Years of education: 14   Highest education level: Not on file  Occupational History   Occupation: Retired Charity fundraiser  Tobacco Use   Smoking status: Former    Current packs/day: 0.00    Average packs/day: 1 pack/day for 55.0  years (55.0 ttl pk-yrs)    Types: Cigarettes    Start date: 08/03/1961    Quit date: 08/03/2016    Years since quitting: 6.7   Smokeless tobacco: Never  Vaping Use   Vaping status: Never Used  Substance and Sexual Activity   Alcohol use: Yes    Alcohol/week: 4.0 - 6.0 standard drinks of alcohol    Types: 4 - 6 Cans of beer per week    Comment: Occ   Drug use: Yes    Frequency: 7.0 times per week    Types: Marijuana   Sexual activity: Not Currently    Birth control/protection: Post-menopausal  Other Topics Concern   Not on file  Social History Narrative   Fun/Hobby: Read   Denies abuse and feels safe at home    Social Determinants of Health   Financial Resource Strain: Low Risk  (03/22/2022)   Overall Financial Resource Strain (CARDIA)    Difficulty of Paying Living Expenses: Not hard at all  Food Insecurity: No Food Insecurity (03/22/2023)   Hunger Vital Sign    Worried About Running Out of Food in the Last Year: Never true    Ran Out of Food in the Last Year: Never true  Transportation Needs: No Transportation Needs (03/22/2023)   PRAPARE - Administrator, Civil Service (Medical): No    Lack of Transportation (Non-Medical): No  Physical Activity: Inactive (03/22/2022)   Exercise Vital Sign    Days of Exercise per Week: 0 days    Minutes of Exercise per Session: 0 min  Stress: No Stress Concern Present (03/22/2022)   [employer] of Occupational Health - Occupational Stress Questionnaire    Feeling of Stress : Not at all  Social Connections: Socially Isolated (03/22/2022)   Social Connection and Isolation Panel [NHANES]    Frequency of Communication with Friends and Family: More than three times a week    Frequency of Social Gatherings with Friends and Family: More than three times a week    Attends Religious Services: Never    Database administrator or Organizations: No    Attends Banker Meetings: Never    Marital Status: Divorced     Review of Systems: A 12 point ROS discussed and pertinent positives are indicated in the HPI above.  All other systems are negative.  Review of Systems  Vital Signs: There were no vitals taken for this visit.  Advance Care Plan: {Advance Care WUJW:[id number]}    Physical Exam  Imaging: MR ABDOMEN WWO CONTRAST  Result Date: 04/25/2023 CLINICAL DATA:  Indeterminate left renal lesion identified by PET-CT, cervical cancer EXAM: MRI ABDOMEN WITHOUT AND WITH CONTRAST TECHNIQUE: Multiplanar multisequence MR imaging of the abdomen was performed both before and after the administration of intravenous contrast. CONTRAST:  8mL GADAVIST GADOBUTROL 1 MMOL/ML IV SOLN COMPARISON:  PET-CT, 04/05/2023 FINDINGS: Lower chest: No acute abnormality. Hepatobiliary: No focal liver abnormality is seen. Status post cholecystectomy. No biliary dilatation. Pancreas: Unremarkable. No pancreatic ductal dilatation or surrounding inflammatory changes. Spleen: Normal in size without significant abnormality. Adrenals/Urinary Tract: Definitively benign, macroscopic fat containing left adrenal adenoma, for which no further follow-up or characterization is required (series 5, image 37). Intrinsically T1 hyperintense hemorrhagic or proteinaceous cyst arising from the posterior superior pole of the left kidney without associated solid component or contrast enhancement. Multiple additional fluid signal renal cortical cysts as well as innumerable very tiny subcentimeter fluid signal lesions, most likely additional tiny cysts. No calculi or hydronephrosis. Stomach/Bowel: Stomach is within normal limits. No evidence of bowel wall thickening, distention, or inflammatory changes. Vascular/Lymphatic: Aortic atherosclerosis. No enlarged abdominal lymph nodes. Other: No abdominal wall hernia or abnormality. No ascites. Musculoskeletal: No acute or significant osseous findings. IMPRESSION: 1. Benign hemorrhagic or proteinaceous cyst arising from  the posterior superior pole of the left kidney without associated solid component or contrast enhancement. 2. Multiple additional fluid signal renal cortical cysts as well as innumerable very tiny subcentimeter fluid signal lesions, most likely additional tiny cysts. No specific follow-up or characterization is required for these benign Bosniak category I and II cysts. 3. Status post cholecystectomy. Aortic Atherosclerosis (ICD10-I70.0).  Electronically Signed   By: Jearld Lesch M.D.   On: 04/25/2023 17:41   NM PET Image Initial (PI) Skull Base To Thigh (F-18 FDG)  Result Date: 04/11/2023 CLINICAL DATA:  Initial treatment strategy for cervical cancer. EXAM: NUCLEAR MEDICINE PET SKULL BASE TO THIGH TECHNIQUE: 8.59 mCi F-18 FDG was injected intravenously. Full-ring PET imaging was performed from the skull base to thigh after the radiotracer. CT data was obtained and used for attenuation correction and anatomic localization. Fasting blood glucose: 1.04 mg/dl COMPARISON:  None Available. FINDINGS: Mediastinal blood pool activity: SUV max 3.1 Liver activity: SUV max NA NECK: No hypermetabolic lymph nodes in the neck. Incidental CT findings: None CHEST: No hypermetabolic mediastinal or hilar nodes. No suspicious pulmonary nodules on the CT scan. Incidental CT findings: Aortic atherosclerosis. Coronary artery calcifications. Post treatment changes within the periphery of the right mid lung identified. Bilateral, nonspecific ground-glass attenuation identified within both upper lobes. ABDOMEN/PELVIS: No abnormal hypermetabolic activity within the liver, pancreas, adrenal glands, or spleen. Right external iliac lymph node measures 0.7 cm with SUV max of 11.62, image 159/4. Cervical mass measures 4.5 x 2.4 cm with SUV max of 18.02, image 169/4. No ascites.  No peritoneal nodularity or mass. Incidental CT findings: Aortic atherosclerosis. Simple right kidney cysts are noted measuring up to 3.6 cm. No follow-up imaging  recommended. Possible exophytic lesion off the upper pole of the left kidney measuring 2 cm, image 1006/4. Difficult to confirm within the limitations of nondiagnostic CT and unenhanced technique. Status post cholecystectomy. SKELETON: No focal hypermetabolic activity to suggest skeletal metastasis. Incidental CT findings: None. IMPRESSION: 1. The cervical mass is hypermetabolic compatible with primary cervical carcinoma. 2. 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 3. No signs of solid organ metastasis. 4. Possible exophytic lesion off the upper pole of the left kidney measuring 2 cm. Difficult to confirm within the limitations of nondiagnostic CT and unenhanced technique. Consider further evaluation with renal ultrasound. 5. Coronary artery calcifications. 6.  Aortic Atherosclerosis (ICD10-I70.0). Electronically Signed   By: Signa Kell M.D.   On: 04/11/2023 10:57    Labs:  CBC: Recent Labs    06/19/22 1114 12/18/22 1125  WBC 6.2 6.5  HGB 13.5 14.1  HCT 39.9 43.3  PLT 151.0 225.0    COAGS: No results for input(s): "INR", "APTT" in the last 8760 hours.  BMP: Recent Labs    06/19/22 1114 12/18/22 1125  NA 137 139  K 4.3 5.1  CL 103 105  CO2 27 26  GLUCOSE 104* 99  BUN 29* 23  CALCIUM 9.6 10.7*  CREATININE 1.42* 1.59*    LIVER FUNCTION TESTS: Recent Labs    06/19/22 1114 12/18/22 1125  BILITOT 0.3 0.4  AST 20 23  ALT 15 21  ALKPHOS 65 64  PROT 7.1 7.4  ALBUMIN 3.9 4.2    TUMOR MARKERS: No results for input(s): "AFPTM", "CEA", "CA199", "CHROMGRNA" in the last 8760 hours.  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 She is a good candidate for concurrent chemoradiation followed by 5 fractions of brachytherapy.  Per her oncologist's request, patient presents for scheduled  Port-A-Cath placement in IR today.  Risks and benefits of Port-A-Cath  placement  was discussed with the patient and/or patient's family including, but not limited to bleeding, infection, damage to adjacent structures or low yield requiring additional tests.  All of the questions were answered and there is  agreement to proceed.  Consent signed and in chart.   Thank you for this interesting consult.  I greatly enjoyed meeting Paula [name] and look forward to participating in their care.  A copy of this report was sent to the requesting provider on this date.  Electronically Signed: Sable Feil, PA-C 04/27/2023, 10:03 AM   I spent a total of  30 Minutes   in face to face in clinical consultation, greater than 50% of which was counseling/coordinating care for Port-A-Cath placement.

## 2023-04-30 ENCOUNTER — Ambulatory Visit (HOSPITAL_COMMUNITY)
Admission: RE | Admit: 2023-04-30 | Discharge: 2023-04-30 | Disposition: A | Discharge status: Home / Self Care | Payer: Medicare Other | Source: Ambulatory Visit | Attending: Hematology and Oncology

## 2023-04-30 ENCOUNTER — Ambulatory Visit (HOSPITAL_COMMUNITY)
Admission: RE | Admit: 2023-04-30 | Discharge: 2023-04-30 | Disposition: A | Discharge status: Home / Self Care | Payer: Medicare Other | Source: Ambulatory Visit | Attending: Hematology and Oncology | Admitting: Hematology and Oncology

## 2023-04-30 ENCOUNTER — Telehealth: Payer: Self-pay | Admitting: *Deleted

## 2023-04-30 ENCOUNTER — Other Ambulatory Visit: Payer: Self-pay

## 2023-04-30 ENCOUNTER — Encounter (HOSPITAL_COMMUNITY): Payer: Self-pay

## 2023-04-30 DIAGNOSIS — C539 Malignant neoplasm of cervix uteri, unspecified: Secondary | ICD-10-CM | POA: Diagnosis present

## 2023-04-30 DIAGNOSIS — C4492 Squamous cell carcinoma of skin, unspecified: Secondary | ICD-10-CM | POA: Diagnosis not present

## 2023-04-30 HISTORY — PX: IR IMAGING GUIDED PORT INSERTION: IMG5740

## 2023-04-30 LAB — GLUCOSE, CAPILLARY: Glucose-Capillary: 91 mg/dL (ref 70–99)

## 2023-04-30 MED ORDER — LIDOCAINE HCL 1 % IJ SOLN
INTRAMUSCULAR | Status: AC
  Filled 2023-04-30: qty 20

## 2023-04-30 MED ORDER — MIDAZOLAM HCL 2 MG/2ML IJ SOLN
INTRAMUSCULAR | Status: AC | PRN
  Administered 2023-04-30 (×2): 1 mg via INTRAVENOUS

## 2023-04-30 MED ORDER — LIDOCAINE-EPINEPHRINE 1 %-1:100000 IJ SOLN
INTRAMUSCULAR | Status: AC
  Filled 2023-04-30: qty 1

## 2023-04-30 MED ORDER — SODIUM CHLORIDE 0.9 % IV SOLN: INTRAVENOUS | Status: DC

## 2023-04-30 MED ORDER — LIDOCAINE HCL 1 % IJ SOLN
20.0000 mL | Freq: Once | INTRAMUSCULAR | Status: AC
  Administered 2023-04-30: 10 mL via INTRADERMAL

## 2023-04-30 MED ORDER — FENTANYL CITRATE (PF) 100 MCG/2ML IJ SOLN
INTRAMUSCULAR | Status: AC | PRN
  Administered 2023-04-30 (×2): 50 ug via INTRAVENOUS

## 2023-04-30 MED ORDER — LIDOCAINE-EPINEPHRINE 1 %-1:100000 IJ SOLN
20.0000 mL | Freq: Once | INTRAMUSCULAR | Status: AC
  Administered 2023-04-30: 10 mL via INTRADERMAL

## 2023-04-30 MED ORDER — HEPARIN SOD (PORK) LOCK FLUSH 100 UNIT/ML IV SOLN
500.0000 [IU] | Freq: Once | INTRAVENOUS | Status: AC
  Administered 2023-04-30: 500 [IU] via INTRAVENOUS

## 2023-04-30 MED ORDER — FENTANYL CITRATE (PF) 100 MCG/2ML IJ SOLN
INTRAMUSCULAR | Status: AC
  Filled 2023-04-30: qty 2

## 2023-04-30 MED ORDER — HEPARIN SOD (PORK) LOCK FLUSH 100 UNIT/ML IV SOLN
INTRAVENOUS | Status: AC
  Filled 2023-04-30: qty 5

## 2023-04-30 MED ORDER — MIDAZOLAM HCL 2 MG/2ML IJ SOLN
INTRAMUSCULAR | Status: AC
Start: 2023-04-30 — End: ?
  Filled 2023-04-30: qty 2

## 2023-04-30 NOTE — Progress Notes (Signed)
Radiation Oncology         [phone] ________________________________  Name: Paula [name] MRN: [id number]  Date: 05/01/2023  DOB: February 28, 1946  Follow-Up Visit Note  CC: Corwin Levins, MD  Clide Cliff, MD  No diagnosis found.  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 A pelvic exam was not performed during her initial consultation visit on 04/26/23 due to the fact that she was late and had some other appointments soon after our appointment. She recently underwent port insertion yesterday.   There has otherwise been no significant interval history since her initial consultation date.   ***                   Allergies:  is allergic to penicillins.  Meds: Current Outpatient Medications  Medication Sig Dispense Refill   aspirin EC 81 MG tablet Take 81 mg by mouth daily.     Cholecalciferol (VITAMIN D3) 2000 units TABS Take by mouth.     divalproex (DEPAKOTE ER) 250 MG 24 hr tablet Take 3 tablets (750 mg total) by mouth every morning. 30 tablet 0   ezetimibe (ZETIA) 10 MG tablet TAKE 1 TABLET BY MOUTH EVERY DAY 90 tablet 3   famotidine (PEPCID) 20 MG tablet TAKE 1 TABLET BY MOUTH TWICE A DAY 180 tablet 3   FLUoxetine (PROZAC) 10 MG capsule Take 10 mg by mouth daily. Takes 2 one time daily  2   INGREZZA 60 MG capsule Take 60 mg by mouth daily.     lidocaine-prilocaine (EMLA) cream Apply 1 Application topically as needed. (Patient taking differently: Apply 1 Application topically as needed. Hasn't started) 30 g 3   linaclotide (LINZESS) 145 MCG CAPS capsule Take 1 capsule (145  mcg total) by mouth daily as needed. 30 capsule 11   multivitamin-iron-minerals-folic acid (CENTRUM) chewable tablet Chew 1 tablet by mouth daily.     MYRBETRIQ 50 MG TB24 tablet TAKE 1 TABLET BY MOUTH EVERY DAY 90 tablet 3   Omega-3 Fatty Acids (FISH OIL) 1000 MG CAPS Take 2 capsules by mouth daily.     ondansetron (ZOFRAN) 8 MG tablet Take 1 tablet (8 mg total) by mouth every 8 (eight) hours as needed for nausea. 30 tablet 3   pravastatin (PRAVACHOL) 40 MG tablet TAKE 1 TABLET BY MOUTH EVERY DAY 90 tablet 3   prochlorperazine (COMPAZINE) 10 MG tablet Take 1 tablet (10 mg total) by mouth every 6 (six) hours as needed for nausea or vomiting. 30 tablet 3   triamcinolone cream (KENALOG) 0.1 % APPLY TO AFFECTED AREA TWICE A DAY 30 g 1   trimethoprim (TRIMPEX) 100 MG tablet Take 1 tablet (100 mg total) by mouth daily. 90 tablet 3   zolpidem (AMBIEN) 5 MG tablet Take 1 tablet (5 mg total) by mouth at bedtime as needed for sleep. 30 tablet 5   No current facility-administered medications for this encounter.   Facility-Administered Medications Ordered in Other Encounters  Medication Dose Route Frequency Provider Last Rate Last Admin   0.9 %  sodium chloride infusion   Intravenous Continuous Carim, Charles A, PA-C 40 mL/hr at 04/30/23 1600  Infusion Verify at 04/30/23 1600    Physical Findings: The patient is in no acute distress. Patient is alert and oriented.  vitals were not taken for this visit. .  No significant changes. Lungs are clear to auscultation bilaterally. Heart has regular rate and rhythm. No palpable cervical, supraclavicular, or axillary adenopathy. Abdomen soft, non-tender, normal bowel sounds.  On pelvic examination the external genitalia were unremarkable. A speculum exam was performed. There are no mucosal lesions noted in the vaginal vault. A Pap smear was obtained of the proximal vagina. On bimanual and rectovaginal examination there were no pelvic masses appreciated.  ***    Lab Findings: Lab Results  Component Value Date   WBC 6.5 12/18/2022   HGB 14.1 12/18/2022   HCT 43.3 12/18/2022   MCV 101.4 (H) 12/18/2022   PLT 225.0 12/18/2022    Radiographic Findings: IR IMAGING GUIDED PORT INSERTION  Result Date: 04/30/2023 INDICATION: Port-A-Cath needed for treatment of cervical cancer. EXAM: FLUOROSCOPIC AND ULTRASOUND GUIDED PLACEMENT OF A SUBCUTANEOUS PORT COMPARISON:  None Available. MEDICATIONS: Moderate sedation ANESTHESIA/SEDATION: Moderate (conscious) sedation was employed during this procedure. A total of Versed 2 mg and fentanyl 100 mcg was administered intravenously at the order of the provider performing the procedure. Total intra-service moderate sedation time: 30 minutes. Patient's level of consciousness and vital signs were monitored continuously by radiology nurse throughout the procedure under the supervision of the provider performing the procedure. FLUOROSCOPY TIME:  Radiation Exposure Index (as provided by the fluoroscopic device): 2 mGy Kerma COMPLICATIONS: None immediate. PROCEDURE: The procedure, risks, benefits, and alternatives were explained to the patient. Questions regarding the procedure were encouraged and answered. The patient understands and consents to the procedure. Patient was placed supine on the interventional table. Ultrasound confirmed a patent right internal jugular vein. Ultrasound image was saved for documentation. The right chest and neck were cleaned with a skin antiseptic and a sterile drape was placed. Maximal barrier sterile technique was utilized including caps, mask, sterile gowns, sterile gloves, sterile drape, hand hygiene and skin antiseptic. The right neck was anesthetized with 1% lidocaine. Small incision was made in the right neck with a blade. Micropuncture set was placed in the right internal jugular vein with ultrasound guidance. The micropuncture wire was used for measurement purposes. The right chest was  anesthetized with 1% lidocaine with epinephrine. #15 blade was used to make an incision and a subcutaneous port pocket was formed. 8 french Power Port was assembled. Subcutaneous tunnel was formed with a stiff tunneling device. The port catheter was brought through the subcutaneous tunnel. The port was placed in the subcutaneous pocket. The micropuncture set was exchanged for a peel-away sheath. The catheter was placed through the peel-away sheath and the tip was positioned at the superior cavoatrial junction. Catheter placement was confirmed with fluoroscopy. The port was accessed and flushed with heparinized saline. The port pocket was closed using two layers of absorbable sutures and Dermabond. The vein skin site was closed using a single layer of absorbable suture and Dermabond. Sterile dressings were applied. Patient tolerated the procedure well without an immediate complication. Ultrasound and fluoroscopic images were taken and saved for this procedure. IMPRESSION: Placement of a subcutaneous power-injectable port device. Catheter tip at the superior cavoatrial junction. Electronically Signed   By: Richarda Overlie M.D.   On: 04/30/2023 15:23   MR ABDOMEN WWO CONTRAST  Result Date: 04/25/2023 CLINICAL DATA:  Indeterminate left renal lesion identified by PET-CT, cervical cancer EXAM: MRI ABDOMEN WITHOUT AND WITH CONTRAST  TECHNIQUE: Multiplanar multisequence MR imaging of the abdomen was performed both before and after the administration of intravenous contrast. CONTRAST:  8mL GADAVIST GADOBUTROL 1 MMOL/ML IV SOLN COMPARISON:  PET-CT, 04/05/2023 FINDINGS: Lower chest: No acute abnormality. Hepatobiliary: No focal liver abnormality is seen. Status post cholecystectomy. No biliary dilatation. Pancreas: Unremarkable. No pancreatic ductal dilatation or surrounding inflammatory changes. Spleen: Normal in size without significant abnormality. Adrenals/Urinary Tract: Definitively benign, macroscopic fat containing left  adrenal adenoma, for which no further follow-up or characterization is required (series 5, image 37). Intrinsically T1 hyperintense hemorrhagic or proteinaceous cyst arising from the posterior superior pole of the left kidney without associated solid component or contrast enhancement. Multiple additional fluid signal renal cortical cysts as well as innumerable very tiny subcentimeter fluid signal lesions, most likely additional tiny cysts. No calculi or hydronephrosis. Stomach/Bowel: Stomach is within normal limits. No evidence of bowel wall thickening, distention, or inflammatory changes. Vascular/Lymphatic: Aortic atherosclerosis. No enlarged abdominal lymph nodes. Other: No abdominal wall hernia or abnormality. No ascites. Musculoskeletal: No acute or significant osseous findings. IMPRESSION: 1. Benign hemorrhagic or proteinaceous cyst arising from the posterior superior pole of the left kidney without associated solid component or contrast enhancement. 2. Multiple additional fluid signal renal cortical cysts as well as innumerable very tiny subcentimeter fluid signal lesions, most likely additional tiny cysts. No specific follow-up or characterization is required for these benign Bosniak category I and II cysts. 3. Status post cholecystectomy. Aortic Atherosclerosis (ICD10-I70.0). Electronically Signed   By: Jearld Lesch M.D.   On: 04/25/2023 17:41   NM PET Image Initial (PI) Skull Base To Thigh (F-18 FDG)  Result Date: 04/11/2023 CLINICAL DATA:  Initial treatment strategy for cervical cancer. EXAM: NUCLEAR MEDICINE PET SKULL BASE TO THIGH TECHNIQUE: 8.59 mCi F-18 FDG was injected intravenously. Full-ring PET imaging was performed from the skull base to thigh after the radiotracer. CT data was obtained and used for attenuation correction and anatomic localization. Fasting blood glucose: 1.04 mg/dl COMPARISON:  None Available. FINDINGS: Mediastinal blood pool activity: SUV max 3.1 Liver activity: SUV max NA  NECK: No hypermetabolic lymph nodes in the neck. Incidental CT findings: None CHEST: No hypermetabolic mediastinal or hilar nodes. No suspicious pulmonary nodules on the CT scan. Incidental CT findings: Aortic atherosclerosis. Coronary artery calcifications. Post treatment changes within the periphery of the right mid lung identified. Bilateral, nonspecific ground-glass attenuation identified within both upper lobes. ABDOMEN/PELVIS: No abnormal hypermetabolic activity within the liver, pancreas, adrenal glands, or spleen. Right external iliac lymph node measures 0.7 cm with SUV max of 11.62, image 159/4. Cervical mass measures 4.5 x 2.4 cm with SUV max of 18.02, image 169/4. No ascites.  No peritoneal nodularity or mass. Incidental CT findings: Aortic atherosclerosis. Simple right kidney cysts are noted measuring up to 3.6 cm. No follow-up imaging recommended. Possible exophytic lesion off the upper pole of the left kidney measuring 2 cm, image 1006/4. Difficult to confirm within the limitations of nondiagnostic CT and unenhanced technique. Status post cholecystectomy. SKELETON: No focal hypermetabolic activity to suggest skeletal metastasis. Incidental CT findings: None. IMPRESSION: 1. The cervical mass is hypermetabolic compatible with primary cervical carcinoma. 2. 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 3. No signs of solid organ metastasis. 4. Possible exophytic lesion off the upper pole of the left kidney measuring 2 cm. Difficult to confirm within the limitations of nondiagnostic CT and unenhanced technique. Consider further evaluation with renal ultrasound. 5. Coronary artery calcifications. 6.  Aortic Atherosclerosis (ICD10-I70.0). Electronically  Signed   By: Signa Kell M.D.   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   ***  Plan:  ***   *** minutes of total time was spent for this patient encounter, including preparation, face-to-face counseling with the patient and coordination of care, physical exam, and documentation of the encounter. ____________________________________  Billie Lade, PhD, MD  This document serves as a record of services personally performed by Antony Blackbird, MD. It was created on his behalf by Neena Rhymes, a trained medical scribe. The creation of this record is based on the scribe's personal observations and the provider's statements to them. This document has been checked and approved by the attending provider.

## 2023-04-30 NOTE — Procedures (Signed)
Interventional Radiology Procedure:   Indications: Cervical cancer  Procedure: Port placement  Findings: Right jugular port, tip at SVC/RA junction  Complications: None     EBL: Minimal, less than 10 ml  Plan: Discharge in one hour.  Keep port site and incisions dry for at least 24 hours.     Lestine Rahe R. Lowella Dandy, MD  Pager: [phone]

## 2023-04-30 NOTE — Progress Notes (Signed)
1615 Ice bag sent home to use for comfort to right upper neck and right upper chest.

## 2023-04-30 NOTE — Telephone Encounter (Signed)
Called patient to ask about coming in tomorrow, patient agreed to come in 05-01-23 @ 12:30 pm, patient verified she will be here

## 2023-04-30 NOTE — Discharge Instructions (Signed)
For questions /concerns may call Interventional Radiology at [phone] or  Interventional Radiology clinic [phone]   You may remove your dressing and shower tomorrow afternoon  DO NOT use EMLA cream for 2 weeks after port placement as the cream will remove surgical glue on your incision.                                                     Implanted Port Insertion, Care After The following information offers guidance on how to care for yourself after your procedure. Your health care provider may also give you more specific instructions. If you have problems or questions, contact your health care provider. What can I expect after the procedure? After the procedure, it is common to have: Discomfort at the port insertion site. Bruising on the skin over the port. This should improve over 3-4 days. Follow these instructions at home: [facility] care After your port is placed, you will get a manufacturer's information card. The card has information about your port. Keep this card with you at all times. Take care of the port as told by your health care provider. Ask your health care provider if you or a family member can get training for taking care of the port at home. A home health care nurse will be be available to help care for the port. Make sure to remember what type of port you have. Incision care  Follow instructions from your health care provider about how to take care of your port insertion site. Make sure you: Wash your hands with soap and water for at least 20 seconds before and after you change your bandage (dressing). If soap and water are not available, use hand sanitizer. Change your dressing as told by your health care provider. Leave stitches (sutures), skin glue, or adhesive strips in place. These skin closures may need to stay in place for 2 weeks or longer. If adhesive strip edges start to loosen and curl up, you may trim the loose edges. Do not remove adhesive strips completely  unless your health care provider tells you to do that. Check your port insertion site every day for signs of infection. Check for: Redness, swelling, or pain. Fluid or blood. Warmth. Pus or a bad smell. Activity Return to your normal activities as told by your health care provider. Ask your health care provider what activities are safe for you. You may have to avoid lifting. Ask your health care provider how much you can safely lift. General instructions Take over-the-counter and prescription medicines only as told by your health care provider. Do not take baths, swim, or use a hot tub until your health care provider approves. Ask your health care provider if you may take showers. You may only be allowed to take sponge baths. If you were given a sedative during the procedure, it can affect you for several hours. Do not drive or operate machinery until your health care provider says that it is safe. Wear a medical alert bracelet in case of an emergency. This will tell any health care providers that you have a port. Keep all follow-up visits. This is important. Contact a health care provider if: You cannot flush your port with saline as directed, or you cannot draw blood from the port. You have a fever or chills. You have redness, swelling, or  pain around your port insertion site. You have fluid or blood coming from your port insertion site. Your port insertion site feels warm to the touch. You have pus or a bad smell coming from the port insertion site. Get help right away if: You have chest pain or shortness of breath. You have bleeding from your port that you cannot control. These symptoms may be an emergency. Get help right away. Call 911. Do not wait to see if the symptoms will go away. Do not drive yourself to the hospital. Summary Take care of the port as told by your health care provider. Keep the manufacturer's information card with you at all times. Change your dressing as told  by your health care provider. Contact a health care provider if you have a fever or chills or if you have redness, swelling, or pain around your port insertion site. Keep all follow-up visits. This information is not intended to replace advice given to you by your health care provider. Make sure you discuss any questions you have with your health care provider. Document Revised: 11/23/2020 Document Reviewed: 11/23/2020 Elsevier Patient Education  2024 [employer] Inc.                                                Moderate Conscious Sedation, Adult, Care After After the procedure, it is common to have: Sleepiness for a few hours. Impaired judgment for a few hours. Trouble with balance. Nausea or vomiting if you eat too soon. Follow these instructions at home: For the time period you were told by your health care provider:  Rest. Do not participate in activities where you could fall or become injured. Do not drive or use machinery. Do not drink alcohol. Do not take sleeping pills or medicines that cause drowsiness. Do not make important decisions or sign legal documents. Do not take care of children on your own. Eating and drinking Follow instructions from your health care provider about what you may eat and drink. Drink enough fluid to keep your urine pale yellow. If you vomit: Drink clear fluids slowly and in small amounts as you are able. Clear fluids include water, ice chips, low-calorie sports drinks, and fruit juice that has water added to it (diluted fruit juice). Eat light and bland foods in small amounts as you are able. These foods include bananas, applesauce, rice, lean meats, toast, and crackers. General instructions Take over-the-counter and prescription medicines only as told by your health care provider. Have a responsible adult stay with you for the time you are told. Do not use any products that contain nicotine or tobacco. These products include cigarettes, chewing  tobacco, and vaping devices, such as e-cigarettes. If you need help quitting, ask your health care provider. Return to your normal activities as told by your health care provider. Ask your health care provider what activities are safe for you. Your health care provider may give you more instructions. Make sure you know what you can and cannot do. Contact a health care provider if: You are still sleepy or having trouble with balance after 24 hours. You feel light-headed. You vomit every time you eat or drink. You get a rash. You have a fever. You have redness or swelling around the IV site. Get help right away if: You have trouble breathing. You start to feel confused at home. These  symptoms may be an emergency. Get help right away. Call 911. Do not wait to see if the symptoms will go away. Do not drive yourself to the hospital. This information is not intended to replace advice given to you by your health care provider. Make sure you discuss any questions you have with your health care provider. Document Revised: 12/05/2021 Document Reviewed: 12/05/2021 Elsevier Patient Education  2024 [employer].

## 2023-05-01 ENCOUNTER — Encounter: Payer: Self-pay | Admitting: Radiation Oncology

## 2023-05-01 ENCOUNTER — Ambulatory Visit: Payer: Medicare Other | Admitting: Radiation Oncology

## 2023-05-01 ENCOUNTER — Ambulatory Visit
Admission: RE | Admit: 2023-05-01 | Discharge: 2023-05-01 | Disposition: A | Discharge status: Home / Self Care | Payer: Medicare Other | Source: Ambulatory Visit | Attending: Radiation Oncology | Admitting: Radiation Oncology

## 2023-05-01 VITALS — BP 155/77 | HR 75 | Temp 97.6°F | Resp 20 | Ht 63.5 in | Wt 168.6 lb

## 2023-05-01 DIAGNOSIS — I700 Atherosclerosis of aorta: Secondary | ICD-10-CM | POA: Diagnosis not present

## 2023-05-01 DIAGNOSIS — C530 Malignant neoplasm of endocervix: Secondary | ICD-10-CM | POA: Insufficient documentation

## 2023-05-01 DIAGNOSIS — C538 Malignant neoplasm of overlapping sites of cervix uteri: Secondary | ICD-10-CM | POA: Insufficient documentation

## 2023-05-01 DIAGNOSIS — Z7982 Long term (current) use of aspirin: Secondary | ICD-10-CM | POA: Insufficient documentation

## 2023-05-01 DIAGNOSIS — N281 Cyst of kidney, acquired: Secondary | ICD-10-CM | POA: Insufficient documentation

## 2023-05-01 DIAGNOSIS — Z79899 Other long term (current) drug therapy: Secondary | ICD-10-CM | POA: Insufficient documentation

## 2023-05-01 DIAGNOSIS — N888 Other specified noninflammatory disorders of cervix uteri: Secondary | ICD-10-CM

## 2023-05-01 NOTE — Progress Notes (Signed)
Paula [name] is here today for exam to the pelvis.    Does the patient complain of any of the following:  Pain: No  Abdominal bloating: No Diarrhea/Constipation:  Constipation Nausea/Vomiting: No Vaginal Discharge: No Blood in Urine or Stool: No Urinary Issues (dysuria/incomplete emptying/ incontinence/ increased frequency/urgency): No  Additional comments if applicable:    BP (!) 155/77 (BP Location: Left Arm, Patient Position: Sitting, Cuff Size: Normal)   Pulse 75   Temp 97.6 F (36.4 C)   Resp 20   Ht 5' 3.5" (1.613 m)   Wt 168 lb 9.6 oz (76.5 kg)   SpO2 97%   BMI 29.40 kg/m

## 2023-05-01 NOTE — Progress Notes (Signed)
Pharmacist Chemotherapy Monitoring - Initial Assessment    Anticipated start date: 05/09/23   The following has been reviewed per standard work regarding the patient's treatment regimen: The patient's diagnosis, treatment plan and drug doses, and organ/hematologic function Lab orders and baseline tests specific to treatment regimen  The treatment plan start date, drug sequencing, and pre-medications Prior authorization status  Patient's documented medication list, including drug-drug interaction screen and prescriptions for anti-emetics and supportive care specific to the treatment regimen The drug concentrations, fluid compatibility, administration routes, and timing of the medications to be used The patient's access for treatment and lifetime cumulative dose history, if applicable  The patient's medication allergies and previous infusion related reactions, if applicable   Changes made to treatment plan:  N/A  Follow up needed:  Pending authorization for treatment    Paula [name], RPH, 05/01/2023  12:01 PM

## 2023-05-02 ENCOUNTER — Ambulatory Visit: Payer: Medicare Other | Admitting: Radiation Oncology

## 2023-05-02 ENCOUNTER — Other Ambulatory Visit: Payer: Self-pay

## 2023-05-07 ENCOUNTER — Encounter: Payer: Self-pay | Admitting: Internal Medicine

## 2023-05-07 NOTE — Progress Notes (Signed)
Received call from patient's son Paula [name] after receiving my card per my request.  Introduced myself as Dance movement psychotherapist and to offer available resources. Advised that available copay assistance would be discussed with [facility] and provided their direct number of [phone] for questions related to copay for treatment. Patient is not on any other type of government assistance to qualify for [employer].  He has my card for any additional financial questions or concerns.

## 2023-05-08 ENCOUNTER — Inpatient Hospital Stay (HOSPITAL_BASED_OUTPATIENT_CLINIC_OR_DEPARTMENT_OTHER): Payer: Medicare Other | Admitting: Internal Medicine

## 2023-05-08 ENCOUNTER — Ambulatory Visit
Admission: RE | Admit: 2023-05-08 | Discharge: 2023-05-08 | Disposition: A | Discharge status: Home / Self Care | Payer: Medicare Other | Source: Ambulatory Visit | Attending: Radiation Oncology | Admitting: Radiation Oncology

## 2023-05-08 ENCOUNTER — Inpatient Hospital Stay: Payer: Medicare Other | Attending: Psychiatry

## 2023-05-08 ENCOUNTER — Encounter: Payer: Self-pay | Admitting: Hematology and Oncology

## 2023-05-08 ENCOUNTER — Inpatient Hospital Stay (HOSPITAL_BASED_OUTPATIENT_CLINIC_OR_DEPARTMENT_OTHER): Payer: Medicare Other | Admitting: Hematology and Oncology

## 2023-05-08 VITALS — BP 148/66 | HR 77 | Temp 97.9°F | Resp 18 | Ht 63.5 in | Wt 168.3 lb

## 2023-05-08 VITALS — BP 148/66 | HR 77 | Temp 97.9°F | Resp 18 | Ht 63.5 in | Wt 168.2 lb

## 2023-05-08 DIAGNOSIS — Z79899 Other long term (current) drug therapy: Secondary | ICD-10-CM | POA: Insufficient documentation

## 2023-05-08 DIAGNOSIS — Z5111 Encounter for antineoplastic chemotherapy: Secondary | ICD-10-CM | POA: Insufficient documentation

## 2023-05-08 DIAGNOSIS — Z23 Encounter for immunization: Secondary | ICD-10-CM | POA: Insufficient documentation

## 2023-05-08 DIAGNOSIS — Z7982 Long term (current) use of aspirin: Secondary | ICD-10-CM | POA: Insufficient documentation

## 2023-05-08 DIAGNOSIS — C538 Malignant neoplasm of overlapping sites of cervix uteri: Secondary | ICD-10-CM | POA: Insufficient documentation

## 2023-05-08 DIAGNOSIS — R251 Tremor, unspecified: Secondary | ICD-10-CM | POA: Diagnosis not present

## 2023-05-08 DIAGNOSIS — R296 Repeated falls: Secondary | ICD-10-CM | POA: Insufficient documentation

## 2023-05-08 DIAGNOSIS — I129 Hypertensive chronic kidney disease with stage 1 through stage 4 chronic kidney disease, or unspecified chronic kidney disease: Secondary | ICD-10-CM | POA: Insufficient documentation

## 2023-05-08 DIAGNOSIS — Z510 Encounter for antineoplastic radiation therapy: Secondary | ICD-10-CM | POA: Insufficient documentation

## 2023-05-08 DIAGNOSIS — N183 Chronic kidney disease, stage 3 unspecified: Secondary | ICD-10-CM | POA: Insufficient documentation

## 2023-05-08 DIAGNOSIS — N1832 Chronic kidney disease, stage 3b: Secondary | ICD-10-CM

## 2023-05-08 DIAGNOSIS — C530 Malignant neoplasm of endocervix: Secondary | ICD-10-CM | POA: Diagnosis present

## 2023-05-08 DIAGNOSIS — C539 Malignant neoplasm of cervix uteri, unspecified: Secondary | ICD-10-CM | POA: Insufficient documentation

## 2023-05-08 LAB — BASIC METABOLIC PANEL - CANCER CENTER ONLY
Anion gap: 5 (ref 5–15)
BUN: 22 mg/dL (ref 8–23)
CO2: 28 mmol/L (ref 22–32)
Calcium: 10.8 mg/dL — ABNORMAL HIGH (ref 8.9–10.3)
Chloride: 106 mmol/L (ref 98–111)
Creatinine: 1.4 mg/dL — ABNORMAL HIGH (ref 0.44–1.00)
GFR, Estimated: 39 mL/min — ABNORMAL LOW (ref >60)
Glucose, Bld: 123 mg/dL — ABNORMAL HIGH (ref 70–99)
Potassium: 4.8 mmol/L (ref 3.5–5.1)
Sodium: 139 mmol/L (ref 135–145)

## 2023-05-08 LAB — CBC WITH DIFFERENTIAL (CANCER CENTER ONLY)
Abs Immature Granulocytes: 0.01 10*3/uL (ref 0.00–0.07)
Basophils Absolute: 0.1 10*3/uL (ref 0.0–0.1)
Basophils Relative: 1 %
Eosinophils Absolute: 0.3 10*3/uL (ref 0.0–0.5)
Eosinophils Relative: 3 %
HCT: 42.5 % (ref 36.0–46.0)
Hemoglobin: 14.3 g/dL (ref 12.0–15.0)
Immature Granulocytes: 0 %
Lymphocytes Relative: 19 %
Lymphs Abs: 1.8 10*3/uL (ref 0.7–4.0)
MCH: 34.3 pg — ABNORMAL HIGH (ref 26.0–34.0)
MCHC: 33.6 g/dL (ref 30.0–36.0)
MCV: 101.9 fL — ABNORMAL HIGH (ref 80.0–100.0)
Monocytes Absolute: 0.6 10*3/uL (ref 0.1–1.0)
Monocytes Relative: 7 %
Neutro Abs: 6.3 10*3/uL (ref 1.7–7.7)
Neutrophils Relative %: 70 %
Platelet Count: 247 10*3/uL (ref 150–400)
RBC: 4.17 MIL/uL (ref 3.87–5.11)
RDW: 12.4 % (ref 11.5–15.5)
WBC Count: 9 10*3/uL (ref 4.0–10.5)
nRBC: 0 % (ref 0.0–0.2)

## 2023-05-08 LAB — MAGNESIUM: Magnesium: 2 mg/dL (ref 1.7–2.4)

## 2023-05-08 MED FILL — Fosaprepitant Dimeglumine For IV Infusion 150 MG (Base Eq): INTRAVENOUS | Qty: 5 | Status: AC

## 2023-05-08 NOTE — Assessment & Plan Note (Signed)
I reviewed the molecular results with the patient and her son We discussed the role of concurrent chemoradiation therapy with weekly cisplatin Due to her slight baseline hearing loss and reduced kidney function, we will proceed with reduced dose chemotherapy We discussed importance of aggressive supportive care to minimize risk of toxicities I will see her weekly for further follow-up

## 2023-05-08 NOTE — Assessment & Plan Note (Addendum)
She has recurrent falls Her recent fall was backwards and she has noticed to have baseline resting tremor She was seen by neuro oncologist Referral to physical therapy is pending

## 2023-05-08 NOTE — Progress Notes (Signed)
[facility] at [facility] [address]  [place], [place] [address] [phone]   New Patient Evaluation  Date of Service: 05/08/23 Patient Name: Paula [name] Patient MRN: [id number] Patient DOB: 13-Mar-1946 Provider: Henreitta Leber, MD  Identifying Statement:  Paula [name] is a 77 y.o. female with Recurrent falls  Tremor who presents for initial consultation and evaluation regarding cancer associated neurologic deficits.    Referring Provider: Corwin Levins, MD [address] [place],  [place] [address]  Primary Cancer:  Oncologic History: Oncology History Overview Note  PD-L1 is negative, CPS <1%   Malignant neoplasm of cervix (HCC)  03/08/2023 Initial Diagnosis   Presented to OB/GYN for postmenopausal bleeding. Pap with HSIL, HPV 16 positive   03/26/2023 Initial Biopsy   Cervical biopsy: A. ANTERIOR RIGHT CERVICAL MASS, BIOPSY:       Invasive squamous cell carcinoma, well-differentiated,  keratinizing.    04/05/2023 Cancer Staging   Staging form: Cervix Uteri, AJCC Version 9 - Clinical stage from 04/05/2023: FIGO Stage IIIC1r, calculated as Stage IIIC1 (cT2b, cN1, cM0) - Signed by Clide Cliff, MD on 04/16/2023 Histopathologic type: Squamous cell carcinoma, NOS Stage prefix: Initial diagnosis Stage confirmation method: Imaging Pelvic nodal status: Positive Pelvic nodal method of assessment: PET Para-aortic status: Negative Para-aortic nodal method of assessment: PET Histologic grade (G): G1 Histologic grading system: 3 grade system   04/05/2023 Imaging   PET: IMPRESSION: 1. The cervical mass is hypermetabolic compatible with primary cervical carcinoma. 2. 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 3. No signs of solid organ metastasis. 4. Possible exophytic lesion off the upper pole of the left kidney measuring 2 cm. Difficult to confirm within the limitations of nondiagnostic CT  and unenhanced technique. Consider further evaluation with renal ultrasound. 5. Coronary artery calcifications. 6.  Aortic Atherosclerosis (ICD10-I70.0).   04/30/2023 Procedure   Placement of a subcutaneous power-injectable port device. Catheter tip at the superior cavoatrial junction.   05/09/2023 -  Chemotherapy   Patient is on Treatment Plan : Cervical cancer Cisplatin (35) q7d + XRT x 6 cycles       History of Present Illness: The patient's records from the referring physician were obtained and reviewed and the patient interviewed to confirm this HPI.  Paula [name] presents today to review recent neurologic symptoms.  She and her son describe years long history of balance impairment, memory, impairment, hand tremors.  In recent weeks there have been additional falls; she tends to fall backwards.  Other complaints, tremor, slow movements are not necessarily worsened recently.  Has longstanding history of bipolar and neuroleptic use, as well as alcohol abuse.  She is being treated currently for tardive dyskinesia through her psychiatrist with [facility], with modest results.  Has taken ambien every night for "many years, decades".  Currently dosing depakote for bipolar d/o.  Has upcoming plans to initiate chemotherapy and radiation for cervical cancer.  Medications: Current Outpatient Medications on File Prior to Visit  Medication Sig Dispense Refill   aspirin EC 81 MG tablet Take 81 mg by mouth daily.     Cholecalciferol (VITAMIN D3) 2000 units TABS Take by mouth.     divalproex (DEPAKOTE ER) 250 MG 24 hr tablet Take 3 tablets (750 mg total) by mouth every morning. 30 tablet 0   ezetimibe (ZETIA) 10 MG tablet TAKE 1 TABLET BY MOUTH EVERY DAY 90 tablet 3   famotidine (PEPCID) 20 MG tablet TAKE 1 TABLET BY MOUTH  TWICE A DAY 180 tablet 3   FLUoxetine (PROZAC) 10 MG capsule Take 10 mg by mouth daily. Takes 2 one time daily  2   INGREZZA 60 MG capsule Take 60 mg by mouth daily.      lidocaine-prilocaine (EMLA) cream Apply 1 Application topically as needed. (Patient taking differently: Apply 1 Application topically as needed. Hasn't started) 30 g 3   linaclotide (LINZESS) 145 MCG CAPS capsule Take 1 capsule (145 mcg total) by mouth daily as needed. 30 capsule 11   multivitamin-iron-minerals-folic acid (CENTRUM) chewable tablet Chew 1 tablet by mouth daily.     MYRBETRIQ 50 MG TB24 tablet TAKE 1 TABLET BY MOUTH EVERY DAY 90 tablet 3   Omega-3 Fatty Acids (FISH OIL) 1000 MG CAPS Take 2 capsules by mouth daily.     ondansetron (ZOFRAN) 8 MG tablet Take 1 tablet (8 mg total) by mouth every 8 (eight) hours as needed for nausea. 30 tablet 3   pravastatin (PRAVACHOL) 40 MG tablet TAKE 1 TABLET BY MOUTH EVERY DAY 90 tablet 3   prochlorperazine (COMPAZINE) 10 MG tablet Take 1 tablet (10 mg total) by mouth every 6 (six) hours as needed for nausea or vomiting. 30 tablet 3   triamcinolone cream (KENALOG) 0.1 % APPLY TO AFFECTED AREA TWICE A DAY 30 g 1   trimethoprim (TRIMPEX) 100 MG tablet Take 1 tablet (100 mg total) by mouth daily. 90 tablet 3   zolpidem (AMBIEN) 5 MG tablet Take 1 tablet (5 mg total) by mouth at bedtime as needed for sleep. 30 tablet 5   No current facility-administered medications on file prior to visit.    Allergies:  Allergies  Allergen Reactions   Penicillins Other (See Comments)    Has taken and not had reaction, allergist did several tests and told her not to take   Past Medical History:  Past Medical History:  Diagnosis Date   Allergy    Arthritis    Bipolar 1 disorder (HCC)    CKD (chronic kidney disease)    Colon polyps    COPD (chronic obstructive pulmonary disease) (HCC)    Depression    Diabetes mellitus without complication (HCC)    GERD (gastroesophageal reflux disease)    History of alcohol abuse    History of chicken pox    History of drug abuse (HCC)    Hyperlipidemia    Hypertension    Mitral valve regurgitation    Urine  incontinence    Past Surgical History:  Past Surgical History:  Procedure Laterality Date   APPENDECTOMY     BREAST BIOPSY     CHOLECYSTECTOMY     IR IMAGING GUIDED PORT INSERTION  04/30/2023   LUNG BIOPSY     SUBTOTAL COLECTOMY  2005   [facility] [place]   Social History:  Social History   Socioeconomic History   Marital status: Single    Spouse name: Not on file   Number of children: 1   Years of education: 14   Highest education level: Not on file  Occupational History   Occupation: Retired Charity fundraiser  Tobacco Use   Smoking status: Former    Current packs/day: 0.00    Average packs/day: 1 pack/day for 55.0 years (55.0 ttl pk-yrs)    Types: Cigarettes    Start date: 08/03/1961    Quit date: 08/03/2016    Years since quitting: 6.7   Smokeless tobacco: Never  Vaping Use   Vaping status: Never Used  Substance and Sexual Activity  Alcohol use: Yes    Alcohol/week: 4.0 - 6.0 standard drinks of alcohol    Types: 4 - 6 Cans of beer per week    Comment: Occ   Drug use: Yes    Frequency: 7.0 times per week    Types: Marijuana   Sexual activity: Not Currently    Birth control/protection: Post-menopausal  Other Topics Concern   Not on file  Social History Narrative   Fun/Hobby: Read   Denies abuse and feels safe at home    Social Determinants of Health   Financial Resource Strain: Low Risk  (03/22/2022)   Overall Financial Resource Strain (CARDIA)    Difficulty of Paying Living Expenses: Not hard at all  Food Insecurity: No Food Insecurity (03/22/2023)   Hunger Vital Sign    Worried About Running Out of Food in the Last Year: Never true    Ran Out of Food in the Last Year: Never true  Transportation Needs: No Transportation Needs (03/22/2023)   PRAPARE - Administrator, Civil Service (Medical): No    Lack of Transportation (Non-Medical): No  Physical Activity: Inactive (03/22/2022)   Exercise Vital Sign    Days of Exercise per Week: 0 days    Minutes of Exercise  per Session: 0 min  Stress: No Stress Concern Present (03/22/2022)   [employer] of Occupational Health - Occupational Stress Questionnaire    Feeling of Stress : Not at all  Social Connections: Socially Isolated (03/22/2022)   Social Connection and Isolation Panel [NHANES]    Frequency of Communication with Friends and Family: More than three times a week    Frequency of Social Gatherings with Friends and Family: More than three times a week    Attends Religious Services: Never    Database administrator or Organizations: No    Attends Banker Meetings: Never    Marital Status: Divorced  Catering manager Violence: Not At Risk (03/22/2023)   Humiliation, Afraid, Rape, and Kick questionnaire    Fear of Current or Ex-Partner: No    Emotionally Abused: No    Physically Abused: No    Sexually Abused: No   Family History:  Family History  Problem Relation Age of Onset   Hyperlipidemia Mother    COPD Mother        smoked   Arthritis Father    Heart disease Father    Diabetes Father    Prostate cancer Brother    Heart disease Paternal Grandmother    Heart disease Paternal Uncle    Breast cancer Neg Hx    Ovarian cancer Neg Hx    Colon cancer Neg Hx    Endometrial cancer Neg Hx     Review of Systems: Constitutional: Doesn't report fevers, chills or abnormal weight loss Eyes: Doesn't report blurriness of vision Ears, nose, mouth, throat, and face: Doesn't report sore throat Respiratory: Doesn't report cough, dyspnea or wheezes Cardiovascular: Doesn't report palpitation, chest discomfort  Gastrointestinal:  Doesn't report nausea, constipation, diarrhea GU: Doesn't report incontinence Skin: Doesn't report skin rashes Neurological: Per HPI Musculoskeletal: Doesn't report joint pain Behavioral/Psych: Doesn't report anxiety  Physical Exam: Vitals:   05/08/23 1013  BP: (!) 148/66  Pulse: 77  Resp: 18  Temp: 97.9 F (36.6 C)  SpO2: 97%   KPS:  70. General: Alert, cooperative, pleasant, in no acute distress Head: Normal EENT: No conjunctival injection or scleral icterus.  Lungs: Resp effort normal Cardiac: Regular rate Abdomen: Non-distended abdomen Skin: No  rashes cyanosis or petechiae. Extremities: No clubbing or edema  Neurologic Exam: Mental Status: Awake, alert, attentive to examiner. Oriented to self and environment. Language is fluent with intact comprehension.  Impaired recall, age advanced psychomotor slowing. Cranial Nerves: Visual acuity is grossly normal. Visual fields are full. Extra-ocular movements intact. No ptosis. Face is symmetric Motor: Tone and bulk are normal. Noted high frequency rest tremor in both hands, polymini-myoclonus with posturing.  Power is full in both arms and legs. Reflexes are symmetric, no pathologic reflexes present.  Sensory: Intact to light touch Gait: Dystaxic with reduced arm carriage and turning en-bloc   Labs: I have reviewed the data as listed    Component Value Date/Time   NA 139 12/18/2022 1125   K 5.1 12/18/2022 1125   CL 105 12/18/2022 1125   CO2 26 12/18/2022 1125   GLUCOSE 99 12/18/2022 1125   BUN 23 12/18/2022 1125   CREATININE 1.59 (H) 12/18/2022 1125   CREATININE 1.44 (H) 12/11/2019 1445   CALCIUM 10.7 (H) 12/18/2022 1125   PROT 7.4 12/18/2022 1125   ALBUMIN 4.2 12/18/2022 1125   AST 23 12/18/2022 1125   ALT 21 12/18/2022 1125   ALKPHOS 64 12/18/2022 1125   BILITOT 0.4 12/18/2022 1125   GFRNONAA 45 (L) 07/31/2017 1106   GFRAA 53 (L) 07/31/2017 1106   Lab Results  Component Value Date   WBC 6.5 12/18/2022   NEUTROABS 3.4 12/18/2022   HGB 14.1 12/18/2022   HCT 43.3 12/18/2022   MCV 101.4 (H) 12/18/2022   PLT 225.0 12/18/2022    Imaging: CLINICAL DATA:  Memory loss   EXAM: MRI HEAD WITHOUT CONTRAST   TECHNIQUE: Multiplanar, multiecho pulse sequences of the brain and surrounding structures were obtained without intravenous contrast.   COMPARISON:   None Available.   FINDINGS: Brain: Generalized atrophy, moderate in degree. Hippocampal atrophy bilaterally. Ventricular enlargement consistent with the level of atrophy.   Negative for acute infarct. Moderate white matter hyperintensity most compatible with chronic microvascular ischemia. Negative for hemorrhage, mass, or fluid collection.   Vascular: Normal arterial flow voids   Skull and upper cervical spine: Negative   Sinuses/Orbits: Paranasal sinuses clear. Bilateral cataract extraction   Other: None   IMPRESSION: 1. Moderate atrophy. Hippocampal atrophy bilaterally. 2. Moderate chronic microvascular ischemic change in the white matter. No acute abnormality.     Electronically Signed   By: Marlan Palau M.D.   On: 04/06/2022 15:28   Assessment/Plan Recurrent falls  Tremor  Paula [name] present with constellation of neurologic symptoms; dystaxia with falls, tremors, age advanced cognitive impairment.  There are some parkinsonian features.  Etiology is suspected multifactorial, given long history of neuroleptic exposure and down-regulation of dopaminergic pathways, significant brain atrophy noted on MRI, history of alcohol abuse.  Additionally, concurrent use of drugs which can depress brain function and balance such as Zolpidem.  Most recent fall was following dosing Oxycodone. Depakote could also be causing or exacerbating tremors.   We will defer any medication changes at this time, especially with upcoming chemotherapy new-start.  Also do not recommend any further CNS workup or imaging at this time.  We counseled her on risks of ambien and other neuroleptic medications, their implication in movement disorders, depressed CNS function.  She will continue to have these medications managed by her primary psychiatrist.  Agree with referral to PT for gait evaluation and rehabilitation.   We spent twenty additional minutes teaching regarding the natural history,  biology, and historical experience in the  treatment of neurologic complications of cancer.   We appreciate the opportunity to participate in the care of Paula [name].  We will evaluate her again in ~2 months, per discussion today.  All questions were answered. The patient knows to call the clinic with any problems, questions or concerns. No barriers to learning were detected.  The total time spent in the encounter was 40 minutes and more than 50% was on counseling and review of test results   Henreitta Leber, MD Medical Director of Neuro-Oncology [facility] 05/08/23 10:07 AM

## 2023-05-08 NOTE — Progress Notes (Signed)
[facility] OFFICE PROGRESS NOTE  Patient Care Team: Corwin Levins, MD as PCP - General (Internal Medicine) Janet Berlin, MD as Consulting Physician (Ophthalmology) Rosalyn Gess, MD as Consulting Physician (Gynecology)  ASSESSMENT & PLAN:  Malignant neoplasm of cervix [facility]) I reviewed the molecular results with the patient and her son We discussed the role of concurrent chemoradiation therapy with weekly cisplatin Due to her slight baseline hearing loss and reduced kidney function, we will proceed with reduced dose chemotherapy We discussed importance of aggressive supportive care to minimize risk of toxicities I will see her weekly for further follow-up  CKD (chronic kidney disease) stage 3, GFR 30-59 ml/min (HCC) She will need upfront dose reduction due to baseline poor renal function We discussed importance of adequate fluid hydration  Recurrent falls She has recurrent falls Her recent fall was backwards and she has noticed to have baseline resting tremor She was seen by neuro oncologist Referral to physical therapy is pending  No orders of the defined types were placed in this encounter.   All questions were answered. The patient knows to call the clinic with any problems, questions or concerns. The total time spent in the appointment was 40 minutes encounter with patients including review of chart and various tests results, discussions about plan of care and coordination of care plan   Artis Delay, MD 05/08/2023 4:36 PM  INTERVAL HISTORY: Please see below for problem oriented charting. she returns for chemotherapy follow-up She is here accompanied by her son She had 1 episode of fall since last time I saw her without major injuries She was seen by neuro oncologist today She is drinking water more consistently She has not drink any alcohol We discussed side effects of treatment and she is willing to proceed with treatment as scheduled  REVIEW OF  SYSTEMS:   Constitutional: Denies fevers, chills or abnormal weight loss Eyes: Denies blurriness of vision Ears, nose, mouth, throat, and face: Denies mucositis or sore throat Respiratory: Denies cough, dyspnea or wheezes Cardiovascular: Denies palpitation, chest discomfort or lower extremity swelling Gastrointestinal:  Denies nausea, heartburn or change in bowel habits Skin: Denies abnormal skin rashes Lymphatics: Denies new lymphadenopathy or easy bruising Neurological:Denies numbness, tingling or new weaknesses Behavioral/Psych: Mood is stable, no new changes  All other systems were reviewed with the patient and are negative.  I have reviewed the past medical history, past surgical history, social history and family history with the patient and they are unchanged from previous note.  ALLERGIES:  is allergic to penicillins.  MEDICATIONS:  Current Outpatient Medications  Medication Sig Dispense Refill   aspirin EC 81 MG tablet Take 81 mg by mouth daily.     Cholecalciferol (VITAMIN D3) 2000 units TABS Take by mouth.     divalproex (DEPAKOTE ER) 250 MG 24 hr tablet Take 3 tablets (750 mg total) by mouth every morning. 30 tablet 0   ezetimibe (ZETIA) 10 MG tablet TAKE 1 TABLET BY MOUTH EVERY DAY 90 tablet 3   famotidine (PEPCID) 20 MG tablet TAKE 1 TABLET BY MOUTH TWICE A DAY 180 tablet 3   FLUoxetine (PROZAC) 10 MG capsule Take 10 mg by mouth daily. Takes 2 one time daily  2   INGREZZA 60 MG capsule Take 60 mg by mouth daily.     lidocaine-prilocaine (EMLA) cream Apply 1 Application topically as needed. (Patient taking differently: Apply 1 Application topically as needed. Hasn't started) 30 g 3   linaclotide (LINZESS) 145 MCG CAPS  capsule Take 1 capsule (145 mcg total) by mouth daily as needed. 30 capsule 11   multivitamin-iron-minerals-folic acid (CENTRUM) chewable tablet Chew 1 tablet by mouth daily.     MYRBETRIQ 50 MG TB24 tablet TAKE 1 TABLET BY MOUTH EVERY DAY 90 tablet 3    Omega-3 Fatty Acids (FISH OIL) 1000 MG CAPS Take 2 capsules by mouth daily.     ondansetron (ZOFRAN) 8 MG tablet Take 1 tablet (8 mg total) by mouth every 8 (eight) hours as needed for nausea. 30 tablet 3   pravastatin (PRAVACHOL) 40 MG tablet TAKE 1 TABLET BY MOUTH EVERY DAY 90 tablet 3   prochlorperazine (COMPAZINE) 10 MG tablet Take 1 tablet (10 mg total) by mouth every 6 (six) hours as needed for nausea or vomiting. 30 tablet 3   triamcinolone cream (KENALOG) 0.1 % APPLY TO AFFECTED AREA TWICE A DAY 30 g 1   trimethoprim (TRIMPEX) 100 MG tablet Take 1 tablet (100 mg total) by mouth daily. 90 tablet 3   zolpidem (AMBIEN) 5 MG tablet Take 1 tablet (5 mg total) by mouth at bedtime as needed for sleep. 30 tablet 5   No current facility-administered medications for this visit.    SUMMARY OF ONCOLOGIC HISTORY: Oncology History Overview Note  PD-L1 is negative, CPS <1%   Malignant neoplasm of cervix (HCC)  03/08/2023 Initial Diagnosis   Presented to OB/GYN for postmenopausal bleeding. Pap with HSIL, HPV 16 positive   03/26/2023 Initial Biopsy   Cervical biopsy: A. ANTERIOR RIGHT CERVICAL MASS, BIOPSY:       Invasive squamous cell carcinoma, well-differentiated,  keratinizing.    04/05/2023 Cancer Staging   Staging form: Cervix Uteri, AJCC Version 9 - Clinical stage from 04/05/2023: FIGO Stage IIIC1r, calculated as Stage IIIC1 (cT2b, cN1, cM0) - Signed by Clide Cliff, MD on 04/16/2023 Histopathologic type: Squamous cell carcinoma, NOS Stage prefix: Initial diagnosis Stage confirmation method: Imaging Pelvic nodal status: Positive Pelvic nodal method of assessment: PET Para-aortic status: Negative Para-aortic nodal method of assessment: PET Histologic grade (G): G1 Histologic grading system: 3 grade system   04/05/2023 Imaging   PET: IMPRESSION: 1. The cervical mass is hypermetabolic compatible with primary cervical carcinoma. 2. Hypermetabolic right external iliac lymph  node compatible with nodal metastasis. 3. No signs of solid organ metastasis. 4. Possible exophytic lesion off the upper pole of the left kidney measuring 2 cm. Difficult to confirm within the limitations of nondiagnostic CT and unenhanced technique. Consider further evaluation with renal ultrasound. 5. Coronary artery calcifications. 6.  Aortic Atherosclerosis (ICD10-I70.0).   04/30/2023 Procedure   Placement of a subcutaneous power-injectable port device. Catheter tip at the superior cavoatrial junction.   05/09/2023 -  Chemotherapy   Patient is on Treatment Plan : Cervical cancer Cisplatin (35) q7d + XRT x 6 cycles       PHYSICAL EXAMINATION: ECOG PERFORMANCE STATUS: 1 - Symptomatic but completely ambulatory  Vitals:   05/08/23 1139  BP: (!) 148/66  Pulse: 77  Resp: 18  Temp: 97.9 F (36.6 C)  SpO2: 97%   Filed Weights   05/08/23 1139  Weight: 168 lb 3.2 oz (76.3 kg)    GENERAL:alert, no distress and comfortable   LABORATORY DATA:  I have reviewed the data as listed    Component Value Date/Time   NA 139 05/08/2023 1116   K 4.8 05/08/2023 1116   CL 106 05/08/2023 1116   CO2 28 05/08/2023 1116   GLUCOSE 123 (H) 05/08/2023 1116   BUN 22  05/08/2023 1116   CREATININE 1.40 (H) 05/08/2023 1116   CREATININE 1.44 (H) 12/11/2019 1445   CALCIUM 10.8 (H) 05/08/2023 1116   PROT 7.4 12/18/2022 1125   ALBUMIN 4.2 12/18/2022 1125   AST 23 12/18/2022 1125   ALT 21 12/18/2022 1125   ALKPHOS 64 12/18/2022 1125   BILITOT 0.4 12/18/2022 1125   GFRNONAA 39 (L) 05/08/2023 1116   GFRAA 53 (L) 07/31/2017 1106    No results found for: "SPEP", "UPEP"  Lab Results  Component Value Date   WBC 9.0 05/08/2023   NEUTROABS 6.3 05/08/2023   HGB 14.3 05/08/2023   HCT 42.5 05/08/2023   MCV 101.9 (H) 05/08/2023   PLT 247 05/08/2023      Chemistry      Component Value Date/Time   NA 139 05/08/2023 1116   K 4.8 05/08/2023 1116   CL 106 05/08/2023 1116   CO2 28 05/08/2023  1116   BUN 22 05/08/2023 1116   CREATININE 1.40 (H) 05/08/2023 1116   CREATININE 1.44 (H) 12/11/2019 1445      Component Value Date/Time   CALCIUM 10.8 (H) 05/08/2023 1116   ALKPHOS 64 12/18/2022 1125   AST 23 12/18/2022 1125   ALT 21 12/18/2022 1125   BILITOT 0.4 12/18/2022 1125

## 2023-05-08 NOTE — Assessment & Plan Note (Signed)
She will need upfront dose reduction due to baseline poor renal function We discussed importance of adequate fluid hydration

## 2023-05-09 ENCOUNTER — Encounter: Payer: Self-pay | Admitting: Hematology and Oncology

## 2023-05-09 ENCOUNTER — Inpatient Hospital Stay: Payer: Medicare Other

## 2023-05-09 VITALS — BP 142/73 | HR 79 | Temp 98.2°F | Resp 18 | Wt 168.4 lb

## 2023-05-09 DIAGNOSIS — N183 Chronic kidney disease, stage 3 unspecified: Secondary | ICD-10-CM | POA: Diagnosis not present

## 2023-05-09 DIAGNOSIS — R296 Repeated falls: Secondary | ICD-10-CM | POA: Diagnosis not present

## 2023-05-09 DIAGNOSIS — Z510 Encounter for antineoplastic radiation therapy: Secondary | ICD-10-CM | POA: Diagnosis not present

## 2023-05-09 DIAGNOSIS — C539 Malignant neoplasm of cervix uteri, unspecified: Secondary | ICD-10-CM

## 2023-05-09 DIAGNOSIS — Z5111 Encounter for antineoplastic chemotherapy: Secondary | ICD-10-CM | POA: Diagnosis not present

## 2023-05-09 DIAGNOSIS — Z23 Encounter for immunization: Secondary | ICD-10-CM | POA: Diagnosis not present

## 2023-05-09 DIAGNOSIS — I129 Hypertensive chronic kidney disease with stage 1 through stage 4 chronic kidney disease, or unspecified chronic kidney disease: Secondary | ICD-10-CM | POA: Diagnosis not present

## 2023-05-09 MED ORDER — SODIUM CHLORIDE 0.9% FLUSH: 10.0000 mL | INTRAVENOUS | Status: DC | PRN

## 2023-05-09 MED ORDER — FOSAPREPITANT DIMEGLUMINE INJECTION 150 MG
150.0000 mg | Freq: Once | INTRAVENOUS | Status: AC
  Administered 2023-05-09: 150 mg via INTRAVENOUS
  Filled 2023-05-09: qty 150

## 2023-05-09 MED ORDER — PALONOSETRON HCL INJECTION 0.25 MG/5ML
0.2500 mg | Freq: Once | INTRAVENOUS | Status: AC
  Administered 2023-05-09: 0.25 mg via INTRAVENOUS
  Filled 2023-05-09: qty 5

## 2023-05-09 MED ORDER — HEPARIN SOD (PORK) LOCK FLUSH 100 UNIT/ML IV SOLN: 500.0000 [IU] | Freq: Once | INTRAVENOUS | Status: DC | PRN

## 2023-05-09 MED ORDER — POTASSIUM CHLORIDE IN NACL 20-0.9 MEQ/L-% IV SOLN
Freq: Once | INTRAVENOUS | Status: AC
  Filled 2023-05-09: qty 1000

## 2023-05-09 MED ORDER — SODIUM CHLORIDE 0.9 % IV SOLN
28.0000 mg/m2 | Freq: Once | INTRAVENOUS | Status: AC
  Administered 2023-05-09: 50 mg via INTRAVENOUS
  Filled 2023-05-09: qty 50

## 2023-05-09 MED ORDER — SODIUM CHLORIDE 0.9 % IV SOLN: INTRAVENOUS | Status: DC

## 2023-05-09 MED ORDER — DEXAMETHASONE SODIUM PHOSPHATE 10 MG/ML IJ SOLN
10.0000 mg | Freq: Once | INTRAMUSCULAR | Status: AC
  Administered 2023-05-09: 10 mg via INTRAVENOUS
  Filled 2023-05-09: qty 1

## 2023-05-09 MED ORDER — MAGNESIUM SULFATE 2 GM/50ML IV SOLN
2.0000 g | Freq: Once | INTRAVENOUS | Status: AC
  Administered 2023-05-09: 2 g via INTRAVENOUS
  Filled 2023-05-09: qty 50

## 2023-05-09 NOTE — Patient Instructions (Signed)
Cisplatin Injection What is this medication? CISPLATIN (SIS pla tin) treats some types of cancer. It works by slowing down the growth of cancer cells. This medicine may be used for other purposes; ask your health care provider or pharmacist if you have questions. COMMON BRAND NAME(S): Platinol, Platinol -AQ What should I tell my care team before I take this medication? They need to know if you have any of these conditions: Eye disease, vision problems Hearing problems Kidney disease Low blood counts, such as low white cells, platelets, or red blood cells Tingling of the fingers or toes, or other nerve disorder An unusual or allergic reaction to cisplatin, carboplatin, oxaliplatin, other medications, foods, dyes, or preservatives If you or your partner are pregnant or trying to get pregnant Breast-feeding How should I use this medication? This medication is injected into a vein. It is given by your care team in a hospital or clinic setting. Talk to your care team about the use of this medication in children. Special care may be needed. Overdosage: If you think you have taken too much of this medicine contact a poison control center or emergency room at once. NOTE: This medicine is only for you. Do not share this medicine with others. What if I miss a dose? Keep appointments for follow-up doses. It is important not to miss your dose. Call your care team if you are unable to keep an appointment. What may interact with this medication? Do not take this medication with any of the following: Live virus vaccines This medication may also interact with the following: Certain antibiotics, such as amikacin, gentamicin, neomycin, polymyxin B, streptomycin, tobramycin, vancomycin Foscarnet This list may not describe all possible interactions. Give your health care provider a list of all the medicines, herbs, non-prescription drugs, or dietary supplements you use. Also tell them if you smoke, drink  alcohol, or use illegal drugs. Some items may interact with your medicine. What should I watch for while using this medication? Your condition will be monitored carefully while you are receiving this medication. You may need blood work done while taking this medication. This medication may make you feel generally unwell. This is not uncommon, as chemotherapy can affect healthy cells as well as cancer cells. Report any side effects. Continue your course of treatment even though you feel ill unless your care team tells you to stop. This medication may increase your risk of getting an infection. Call your care team for advice if you get a fever, chills, sore throat, or other symptoms of a cold or flu. Do not treat yourself. Try to avoid being around people who are sick. Avoid taking medications that contain aspirin, acetaminophen, ibuprofen, naproxen, or ketoprofen unless instructed by your care team. These medications may hide a fever. This medication may increase your risk to bruise or bleed. Call your care team if you notice any unusual bleeding. Be careful brushing or flossing your teeth or using a toothpick because you may get an infection or bleed more easily. If you have any dental work done, tell your dentist you are receiving this medication. Drink fluids as directed while you are taking this medication. This will help protect your kidneys. Call your care team if you get diarrhea. Do not treat yourself. Talk to your care team if you or your partner wish to become pregnant or think you might be pregnant. This medication can cause serious birth defects if taken during pregnancy and for 14 months after the last dose. A negative pregnancy  test is required before starting this medication. A reliable form of contraception is recommended while taking this medication and for 14 months after the last dose. Talk to your care team about effective forms of contraception. Do not father a child while taking this  medication and for 11 months after the last dose. Use a condom during sex during this time period. Do not breast-feed while taking this medication. This medication may cause infertility. Talk to your care team if you are concerned about your fertility. What side effects may I notice from receiving this medication? Side effects that you should report to your care team as soon as possible: Allergic reactions--skin rash, itching, hives, swelling of the face, lips, tongue, or throat Eye pain, change in vision, vision loss Hearing loss, ringing in ears Infection--fever, chills, cough, sore throat, wounds that don't heal, pain or trouble when passing urine, general feeling of discomfort or being unwell Kidney injury--decrease in the amount of urine, swelling of the ankles, hands, or feet Low red blood cell level--unusual weakness or fatigue, dizziness, headache, trouble breathing Painful swelling, warmth, or redness of the skin, blisters or sores at the infusion site Pain, tingling, or numbness in the hands or feet Unusual bruising or bleeding Side effects that usually do not require medical attention (report to your care team if they continue or are bothersome): Hair loss Nausea Vomiting This list may not describe all possible side effects. Call your doctor for medical advice about side effects. You may report side effects to FDA at 1-800-FDA-1088. Where should I keep my medication? This medication is given in a hospital or clinic. It will not be stored at home. NOTE: This sheet is a summary. It may not cover all possible information. If you have questions about this medicine, talk to your doctor, pharmacist, or health care provider.  2024 Elsevier/Gold Standard (2021-09-23 00:00:00)  [facility] - [facility] DEPT [facility]  Discharge Instructions: Thank you for choosing [facility] to provide your oncology and hematology care.   If you have a lab  appointment with the Cancer Center, please go directly to the Cancer Center and check in at the registration area.   Wear comfortable clothing and clothing appropriate for easy access to any Portacath or PICC line.   We strive to give you quality time with your provider. You may need to reschedule your appointment if you arrive late (15 or more minutes).  Arriving late affects you and other patients whose appointments are after yours.  Also, if you miss three or more appointments without notifying the office, you may be dismissed from the clinic at the provider's discretion.      For prescription refill requests, have your pharmacy contact our office and allow 72 hours for refills to be completed.    Today you received the following chemotherapy and/or immunotherapy agents :  Cisplatin       To help prevent nausea and vomiting after your treatment, we encourage you to take your nausea medication as directed.  BELOW ARE SYMPTOMS THAT SHOULD BE REPORTED IMMEDIATELY: *FEVER GREATER THAN 100.4 F (38 C) OR HIGHER *CHILLS OR SWEATING *NAUSEA AND VOMITING THAT IS NOT CONTROLLED WITH YOUR NAUSEA MEDICATION *UNUSUAL SHORTNESS OF BREATH *UNUSUAL BRUISING OR BLEEDING *URINARY PROBLEMS (pain or burning when urinating, or frequent urination) *BOWEL PROBLEMS (unusual diarrhea, constipation, pain near the anus) TENDERNESS IN MOUTH AND THROAT WITH OR WITHOUT PRESENCE OF ULCERS (sore throat, sores in  mouth, or a toothache) UNUSUAL RASH, SWELLING OR PAIN  UNUSUAL VAGINAL DISCHARGE OR ITCHING   Items with * indicate a potential emergency and should be followed up as soon as possible or go to the Emergency Department if any problems should occur.  Please show the CHEMOTHERAPY ALERT CARD or IMMUNOTHERAPY ALERT CARD at check-in to the Emergency Department and triage nurse.  Should you have questions after your visit or need to cancel or reschedule your appointment, please contact [facility] - A  DEPT [facility]  Dept: [phone]  and follow the prompts.  Office hours are 8:00 a.m. to 4:30 p.m. Monday - Friday. Please note that voicemails left after 4:00 p.m. may not be returned until the following business day.  We are closed weekends and major holidays. You have access to a nurse at all times for urgent questions. Please call the main number to the clinic Dept: [phone] and follow the prompts.   For any non-urgent questions, you may also contact your provider using MyChart. We now offer e-Visits for anyone 46 and older to request care online for non-urgent symptoms. For details visit [email or web address].   Also download the MyChart app! Go to the app store, search "MyChart", open the app, select [place], and log in with your MyChart username and password.

## 2023-05-10 ENCOUNTER — Ambulatory Visit: Payer: Medicare Other | Admitting: Physical Therapy

## 2023-05-10 ENCOUNTER — Other Ambulatory Visit: Payer: Self-pay

## 2023-05-10 ENCOUNTER — Telehealth: Payer: Self-pay | Admitting: Oncology

## 2023-05-10 DIAGNOSIS — I129 Hypertensive chronic kidney disease with stage 1 through stage 4 chronic kidney disease, or unspecified chronic kidney disease: Secondary | ICD-10-CM | POA: Diagnosis not present

## 2023-05-10 DIAGNOSIS — Z510 Encounter for antineoplastic radiation therapy: Secondary | ICD-10-CM | POA: Diagnosis not present

## 2023-05-10 DIAGNOSIS — Z23 Encounter for immunization: Secondary | ICD-10-CM | POA: Diagnosis not present

## 2023-05-10 DIAGNOSIS — C538 Malignant neoplasm of overlapping sites of cervix uteri: Secondary | ICD-10-CM

## 2023-05-10 DIAGNOSIS — R296 Repeated falls: Secondary | ICD-10-CM | POA: Diagnosis not present

## 2023-05-10 DIAGNOSIS — N183 Chronic kidney disease, stage 3 unspecified: Secondary | ICD-10-CM | POA: Diagnosis not present

## 2023-05-10 DIAGNOSIS — Z5111 Encounter for antineoplastic chemotherapy: Secondary | ICD-10-CM | POA: Diagnosis not present

## 2023-05-10 LAB — RAD ONC ARIA SESSION SUMMARY
Course Elapsed Days: 0
Plan Fractions Treated to Date: 1
Plan Prescribed Dose Per Fraction: 1.8 Gy
Plan Total Fractions Prescribed: 25
Plan Total Prescribed Dose: 45 Gy
Reference Point Dosage Given to Date: 1.8 Gy
Reference Point Session Dosage Given: 1.8 Gy
Session Number: 1

## 2023-05-10 NOTE — Telephone Encounter (Signed)
Alycia Rossetti (son) left a message that [place] needs assistance with transportation tomorrow and a few days next week.    Referral sent to the transportation coordinator.

## 2023-05-11 ENCOUNTER — Ambulatory Visit
Admission: RE | Admit: 2023-05-11 | Discharge: 2023-05-11 | Disposition: A | Discharge status: Home / Self Care | Payer: Medicare Other | Source: Ambulatory Visit | Attending: Radiation Oncology | Admitting: Radiation Oncology

## 2023-05-11 ENCOUNTER — Encounter: Payer: Self-pay | Admitting: Hematology and Oncology

## 2023-05-11 ENCOUNTER — Other Ambulatory Visit: Payer: Self-pay

## 2023-05-11 DIAGNOSIS — N183 Chronic kidney disease, stage 3 unspecified: Secondary | ICD-10-CM | POA: Diagnosis not present

## 2023-05-11 DIAGNOSIS — R296 Repeated falls: Secondary | ICD-10-CM | POA: Diagnosis not present

## 2023-05-11 DIAGNOSIS — Z23 Encounter for immunization: Secondary | ICD-10-CM | POA: Diagnosis not present

## 2023-05-11 DIAGNOSIS — Z510 Encounter for antineoplastic radiation therapy: Secondary | ICD-10-CM | POA: Diagnosis not present

## 2023-05-11 DIAGNOSIS — Z5111 Encounter for antineoplastic chemotherapy: Secondary | ICD-10-CM | POA: Diagnosis not present

## 2023-05-11 DIAGNOSIS — I129 Hypertensive chronic kidney disease with stage 1 through stage 4 chronic kidney disease, or unspecified chronic kidney disease: Secondary | ICD-10-CM | POA: Diagnosis not present

## 2023-05-11 LAB — RAD ONC ARIA SESSION SUMMARY
Course Elapsed Days: 1
Plan Fractions Treated to Date: 2
Plan Prescribed Dose Per Fraction: 1.8 Gy
Plan Total Fractions Prescribed: 25
Plan Total Prescribed Dose: 45 Gy
Reference Point Dosage Given to Date: 3.6 Gy
Reference Point Session Dosage Given: 1.8 Gy
Session Number: 2

## 2023-05-14 ENCOUNTER — Other Ambulatory Visit: Payer: Self-pay

## 2023-05-14 ENCOUNTER — Inpatient Hospital Stay: Payer: Medicare Other

## 2023-05-14 ENCOUNTER — Ambulatory Visit
Admission: RE | Admit: 2023-05-14 | Discharge: 2023-05-14 | Disposition: A | Discharge status: Home / Self Care | Payer: Medicare Other | Source: Ambulatory Visit | Attending: Radiation Oncology | Admitting: Radiation Oncology

## 2023-05-14 DIAGNOSIS — N183 Chronic kidney disease, stage 3 unspecified: Secondary | ICD-10-CM | POA: Diagnosis not present

## 2023-05-14 DIAGNOSIS — Z510 Encounter for antineoplastic radiation therapy: Secondary | ICD-10-CM | POA: Diagnosis not present

## 2023-05-14 DIAGNOSIS — C539 Malignant neoplasm of cervix uteri, unspecified: Secondary | ICD-10-CM

## 2023-05-14 DIAGNOSIS — I129 Hypertensive chronic kidney disease with stage 1 through stage 4 chronic kidney disease, or unspecified chronic kidney disease: Secondary | ICD-10-CM | POA: Diagnosis not present

## 2023-05-14 DIAGNOSIS — Z23 Encounter for immunization: Secondary | ICD-10-CM | POA: Diagnosis not present

## 2023-05-14 DIAGNOSIS — Z5111 Encounter for antineoplastic chemotherapy: Secondary | ICD-10-CM | POA: Diagnosis not present

## 2023-05-14 DIAGNOSIS — C538 Malignant neoplasm of overlapping sites of cervix uteri: Secondary | ICD-10-CM

## 2023-05-14 DIAGNOSIS — R296 Repeated falls: Secondary | ICD-10-CM | POA: Diagnosis not present

## 2023-05-14 LAB — BASIC METABOLIC PANEL - CANCER CENTER ONLY
Anion gap: 6 (ref 5–15)
BUN: 27 mg/dL — ABNORMAL HIGH (ref 8–23)
CO2: 28 mmol/L (ref 22–32)
Calcium: 9.8 mg/dL (ref 8.9–10.3)
Chloride: 102 mmol/L (ref 98–111)
Creatinine: 1.51 mg/dL — ABNORMAL HIGH (ref 0.44–1.00)
GFR, Estimated: 35 mL/min — ABNORMAL LOW (ref >60)
Glucose, Bld: 84 mg/dL (ref 70–99)
Potassium: 4.6 mmol/L (ref 3.5–5.1)
Sodium: 136 mmol/L (ref 135–145)

## 2023-05-14 LAB — CBC WITH DIFFERENTIAL (CANCER CENTER ONLY)
Abs Immature Granulocytes: 0.02 10*3/uL (ref 0.00–0.07)
Basophils Absolute: 0 10*3/uL (ref 0.0–0.1)
Basophils Relative: 1 %
Eosinophils Absolute: 0.2 10*3/uL (ref 0.0–0.5)
Eosinophils Relative: 2 %
HCT: 38.9 % (ref 36.0–46.0)
Hemoglobin: 13 g/dL (ref 12.0–15.0)
Immature Granulocytes: 0 %
Lymphocytes Relative: 23 %
Lymphs Abs: 1.9 10*3/uL (ref 0.7–4.0)
MCH: 33.9 pg (ref 26.0–34.0)
MCHC: 33.4 g/dL (ref 30.0–36.0)
MCV: 101.6 fL — ABNORMAL HIGH (ref 80.0–100.0)
Monocytes Absolute: 0.7 10*3/uL (ref 0.1–1.0)
Monocytes Relative: 9 %
Neutro Abs: 5.5 10*3/uL (ref 1.7–7.7)
Neutrophils Relative %: 65 %
Platelet Count: 208 10*3/uL (ref 150–400)
RBC: 3.83 MIL/uL — ABNORMAL LOW (ref 3.87–5.11)
RDW: 11.9 % (ref 11.5–15.5)
WBC Count: 8.4 10*3/uL (ref 4.0–10.5)
nRBC: 0 % (ref 0.0–0.2)

## 2023-05-14 LAB — RAD ONC ARIA SESSION SUMMARY
Course Elapsed Days: 4
Plan Fractions Treated to Date: 3
Plan Prescribed Dose Per Fraction: 1.8 Gy
Plan Total Fractions Prescribed: 25
Plan Total Prescribed Dose: 45 Gy
Reference Point Dosage Given to Date: 5.4 Gy
Reference Point Session Dosage Given: 1.8 Gy
Session Number: 3

## 2023-05-14 LAB — MAGNESIUM: Magnesium: 1.9 mg/dL (ref 1.7–2.4)

## 2023-05-14 MED ORDER — HEPARIN SOD (PORK) LOCK FLUSH 100 UNIT/ML IV SOLN
500.0000 [IU] | Freq: Once | INTRAVENOUS | Status: AC
  Administered 2023-05-14: 500 [IU]

## 2023-05-14 MED ORDER — SODIUM CHLORIDE 0.9% FLUSH
10.0000 mL | Freq: Once | INTRAVENOUS | Status: AC
  Administered 2023-05-14: 10 mL

## 2023-05-15 ENCOUNTER — Encounter: Payer: Self-pay | Admitting: Hematology and Oncology

## 2023-05-15 ENCOUNTER — Ambulatory Visit
Admission: RE | Admit: 2023-05-15 | Discharge: 2023-05-15 | Disposition: A | Discharge status: Home / Self Care | Payer: Medicare Other | Source: Ambulatory Visit | Attending: Radiation Oncology | Admitting: Radiation Oncology

## 2023-05-15 ENCOUNTER — Inpatient Hospital Stay (HOSPITAL_BASED_OUTPATIENT_CLINIC_OR_DEPARTMENT_OTHER): Payer: Medicare Other | Admitting: Hematology and Oncology

## 2023-05-15 ENCOUNTER — Other Ambulatory Visit: Payer: Self-pay

## 2023-05-15 ENCOUNTER — Other Ambulatory Visit: Payer: Medicare Other

## 2023-05-15 VITALS — BP 129/65 | HR 75 | Temp 99.3°F | Resp 18 | Ht 63.5 in | Wt 165.8 lb

## 2023-05-15 DIAGNOSIS — R296 Repeated falls: Secondary | ICD-10-CM | POA: Diagnosis not present

## 2023-05-15 DIAGNOSIS — N1832 Chronic kidney disease, stage 3b: Secondary | ICD-10-CM

## 2023-05-15 DIAGNOSIS — N183 Chronic kidney disease, stage 3 unspecified: Secondary | ICD-10-CM | POA: Diagnosis not present

## 2023-05-15 DIAGNOSIS — Z510 Encounter for antineoplastic radiation therapy: Secondary | ICD-10-CM | POA: Diagnosis not present

## 2023-05-15 DIAGNOSIS — Z23 Encounter for immunization: Secondary | ICD-10-CM | POA: Diagnosis not present

## 2023-05-15 DIAGNOSIS — I129 Hypertensive chronic kidney disease with stage 1 through stage 4 chronic kidney disease, or unspecified chronic kidney disease: Secondary | ICD-10-CM | POA: Diagnosis not present

## 2023-05-15 DIAGNOSIS — Z5111 Encounter for antineoplastic chemotherapy: Secondary | ICD-10-CM | POA: Diagnosis not present

## 2023-05-15 DIAGNOSIS — C538 Malignant neoplasm of overlapping sites of cervix uteri: Secondary | ICD-10-CM

## 2023-05-15 LAB — RAD ONC ARIA SESSION SUMMARY
Course Elapsed Days: 5
Plan Fractions Treated to Date: 4
Plan Prescribed Dose Per Fraction: 1.8 Gy
Plan Total Fractions Prescribed: 25
Plan Total Prescribed Dose: 45 Gy
Reference Point Dosage Given to Date: 7.2 Gy
Reference Point Session Dosage Given: 1.8 Gy
Session Number: 4

## 2023-05-15 MED FILL — Fosaprepitant Dimeglumine For IV Infusion 150 MG (Base Eq): INTRAVENOUS | Qty: 5 | Status: AC

## 2023-05-15 NOTE — Assessment & Plan Note (Signed)
She tolerated treatment well without major side effects We discussed test results We discussed importance of adequate hydration I will see her weekly for further follow-up  She is overdue for influenza vaccination.  We will give her influenza vaccination tomorrow when she show up for her treatment We discussed the importance of preventive care and reviewed the vaccination programs. She does not have any prior allergic reactions to influenza vaccination.

## 2023-05-15 NOTE — Progress Notes (Signed)
[facility] OFFICE PROGRESS NOTE  Patient Care Team: Corwin Levins, MD as PCP - General (Internal Medicine) Janet Berlin, MD as Consulting Physician (Ophthalmology) Rosalyn Gess, MD as Consulting Physician (Gynecology)  ASSESSMENT & PLAN:  Malignant neoplasm of cervix [facility]) She tolerated treatment well without major side effects We discussed test results We discussed importance of adequate hydration I will see her weekly for further follow-up  She is overdue for influenza vaccination.  We will give her influenza vaccination tomorrow when she show up for her treatment We discussed the importance of preventive care and reviewed the vaccination programs. She does not have any prior allergic reactions to influenza vaccination.   CKD (chronic kidney disease) stage 3, GFR 30-59 ml/min (HCC) She will need upfront dose reduction due to baseline poor renal function We discussed importance of adequate fluid hydration  No orders of the defined types were placed in this encounter.   All questions were answered. The patient knows to call the clinic with any problems, questions or concerns. The total time spent in the appointment was 20 minutes encounter with patients including review of chart and various tests results, discussions about plan of care and coordination of care plan   Artis Delay, MD 05/15/2023 1:23 PM  INTERVAL HISTORY: Please see below for problem oriented charting. she returns for chemotherapy follow-up prior to cycle 2 of treatment She tolerated cycle 1 well Denies nausea vomiting No worsening peripheral neuropathy She is attempting to drink more water  REVIEW OF SYSTEMS:   Constitutional: Denies fevers, chills or abnormal weight loss Eyes: Denies blurriness of vision Ears, nose, mouth, throat, and face: Denies mucositis or sore throat Respiratory: Denies cough, dyspnea or wheezes Cardiovascular: Denies palpitation, chest discomfort or lower  extremity swelling Gastrointestinal:  Denies nausea, heartburn or change in bowel habits Skin: Denies abnormal skin rashes Lymphatics: Denies new lymphadenopathy or easy bruising Neurological:Denies numbness, tingling or new weaknesses Behavioral/Psych: Mood is stable, no new changes  All other systems were reviewed with the patient and are negative.  I have reviewed the past medical history, past surgical history, social history and family history with the patient and they are unchanged from previous note.  ALLERGIES:  is allergic to penicillins.  MEDICATIONS:  Current Outpatient Medications  Medication Sig Dispense Refill   aspirin EC 81 MG tablet Take 81 mg by mouth daily.     Cholecalciferol (VITAMIN D3) 2000 units TABS Take by mouth.     divalproex (DEPAKOTE ER) 250 MG 24 hr tablet Take 3 tablets (750 mg total) by mouth every morning. 30 tablet 0   ezetimibe (ZETIA) 10 MG tablet TAKE 1 TABLET BY MOUTH EVERY DAY 90 tablet 3   famotidine (PEPCID) 20 MG tablet TAKE 1 TABLET BY MOUTH TWICE A DAY 180 tablet 3   FLUoxetine (PROZAC) 10 MG capsule Take 10 mg by mouth daily. Takes 2 one time daily  2   INGREZZA 60 MG capsule Take 60 mg by mouth daily.     lidocaine-prilocaine (EMLA) cream Apply 1 Application topically as needed. (Patient taking differently: Apply 1 Application topically as needed. Hasn't started) 30 g 3   linaclotide (LINZESS) 145 MCG CAPS capsule Take 1 capsule (145 mcg total) by mouth daily as needed. 30 capsule 11   multivitamin-iron-minerals-folic acid (CENTRUM) chewable tablet Chew 1 tablet by mouth daily.     MYRBETRIQ 50 MG TB24 tablet TAKE 1 TABLET BY MOUTH EVERY DAY 90 tablet 3   Omega-3 Fatty Acids (FISH  OIL) 1000 MG CAPS Take 2 capsules by mouth daily.     ondansetron (ZOFRAN) 8 MG tablet Take 1 tablet (8 mg total) by mouth every 8 (eight) hours as needed for nausea. 30 tablet 3   pravastatin (PRAVACHOL) 40 MG tablet TAKE 1 TABLET BY MOUTH EVERY DAY 90 tablet 3    prochlorperazine (COMPAZINE) 10 MG tablet Take 1 tablet (10 mg total) by mouth every 6 (six) hours as needed for nausea or vomiting. 30 tablet 3   triamcinolone cream (KENALOG) 0.1 % APPLY TO AFFECTED AREA TWICE A DAY 30 g 1   trimethoprim (TRIMPEX) 100 MG tablet Take 1 tablet (100 mg total) by mouth daily. 90 tablet 3   zolpidem (AMBIEN) 5 MG tablet Take 1 tablet (5 mg total) by mouth at bedtime as needed for sleep. 30 tablet 5   No current facility-administered medications for this visit.    SUMMARY OF ONCOLOGIC HISTORY: Oncology History Overview Note  PD-L1 is negative, CPS <1%   Malignant neoplasm of cervix (HCC)  03/08/2023 Initial Diagnosis   Presented to OB/GYN for postmenopausal bleeding. Pap with HSIL, HPV 16 positive   03/26/2023 Initial Biopsy   Cervical biopsy: A. ANTERIOR RIGHT CERVICAL MASS, BIOPSY:       Invasive squamous cell carcinoma, well-differentiated,  keratinizing.    04/05/2023 Cancer Staging   Staging form: Cervix Uteri, AJCC Version 9 - Clinical stage from 04/05/2023: FIGO Stage IIIC1r, calculated as Stage IIIC1 (cT2b, cN1, cM0) - Signed by Clide Cliff, MD on 04/16/2023 Histopathologic type: Squamous cell carcinoma, NOS Stage prefix: Initial diagnosis Stage confirmation method: Imaging Pelvic nodal status: Positive Pelvic nodal method of assessment: PET Para-aortic status: Negative Para-aortic nodal method of assessment: PET Histologic grade (G): G1 Histologic grading system: 3 grade system   04/05/2023 Imaging   PET: IMPRESSION: 1. The cervical mass is hypermetabolic compatible with primary cervical carcinoma. 2. 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 3. No signs of solid organ metastasis. 4. Possible exophytic lesion off the upper pole of the left kidney measuring 2 cm. Difficult to confirm within the limitations of nondiagnostic CT and unenhanced technique. Consider further evaluation with renal  ultrasound. 5. Coronary artery calcifications. 6.  Aortic Atherosclerosis (ICD10-I70.0).   04/30/2023 Procedure   Placement of a subcutaneous power-injectable port device. Catheter tip at the superior cavoatrial junction.   05/09/2023 -  Chemotherapy   Patient is on Treatment Plan : Cervical cancer Cisplatin (35) q7d + XRT x 6 cycles       PHYSICAL EXAMINATION: ECOG PERFORMANCE STATUS: 0 - Asymptomatic  Vitals:   05/15/23 1226  BP: 129/65  Pulse: 75  Resp: 18  Temp: 99.3 F (37.4 C)  SpO2: 93%   Filed Weights   05/15/23 1226  Weight: 165 lb 12.8 oz (75.2 kg)    GENERAL:alert, no distress and comfortable   LABORATORY DATA:  I have reviewed the data as listed    Component Value Date/Time   NA 136 05/14/2023 1359   K 4.6 05/14/2023 1359   CL 102 05/14/2023 1359   CO2 28 05/14/2023 1359   GLUCOSE 84 05/14/2023 1359   BUN 27 (H) 05/14/2023 1359   CREATININE 1.51 (H) 05/14/2023 1359   CREATININE 1.44 (H) 12/11/2019 1445   CALCIUM 9.8 05/14/2023 1359   PROT 7.4 12/18/2022 1125   ALBUMIN 4.2 12/18/2022 1125   AST 23 12/18/2022 1125   ALT 21 12/18/2022 1125   ALKPHOS 64 12/18/2022 1125   BILITOT 0.4 12/18/2022  1125   GFRNONAA 35 (L) 05/14/2023 1359   GFRAA 53 (L) 07/31/2017 1106    No results found for: "SPEP", "UPEP"  Lab Results  Component Value Date   WBC 8.4 05/14/2023   NEUTROABS 5.5 05/14/2023   HGB 13.0 05/14/2023   HCT 38.9 05/14/2023   MCV 101.6 (H) 05/14/2023   PLT 208 05/14/2023      Chemistry      Component Value Date/Time   NA 136 05/14/2023 1359   K 4.6 05/14/2023 1359   CL 102 05/14/2023 1359   CO2 28 05/14/2023 1359   BUN 27 (H) 05/14/2023 1359   CREATININE 1.51 (H) 05/14/2023 1359   CREATININE 1.44 (H) 12/11/2019 1445      Component Value Date/Time   CALCIUM 9.8 05/14/2023 1359   ALKPHOS 64 12/18/2022 1125   AST 23 12/18/2022 1125   ALT 21 12/18/2022 1125   BILITOT 0.4 12/18/2022 1125       RADIOGRAPHIC STUDIES: I have  personally reviewed the radiological images as listed and agreed with the findings in the report. IR IMAGING GUIDED PORT INSERTION  Result Date: 04/30/2023 INDICATION: Port-A-Cath needed for treatment of cervical cancer. EXAM: FLUOROSCOPIC AND ULTRASOUND GUIDED PLACEMENT OF A SUBCUTANEOUS PORT COMPARISON:  None Available. MEDICATIONS: Moderate sedation ANESTHESIA/SEDATION: Moderate (conscious) sedation was employed during this procedure. A total of Versed 2 mg and fentanyl 100 mcg was administered intravenously at the order of the provider performing the procedure. Total intra-service moderate sedation time: 30 minutes. Patient's level of consciousness and vital signs were monitored continuously by radiology nurse throughout the procedure under the supervision of the provider performing the procedure. FLUOROSCOPY TIME:  Radiation Exposure Index (as provided by the fluoroscopic device): 2 mGy Kerma COMPLICATIONS: None immediate. PROCEDURE: The procedure, risks, benefits, and alternatives were explained to the patient. Questions regarding the procedure were encouraged and answered. The patient understands and consents to the procedure. Patient was placed supine on the interventional table. Ultrasound confirmed a patent right internal jugular vein. Ultrasound image was saved for documentation. The right chest and neck were cleaned with a skin antiseptic and a sterile drape was placed. Maximal barrier sterile technique was utilized including caps, mask, sterile gowns, sterile gloves, sterile drape, hand hygiene and skin antiseptic. The right neck was anesthetized with 1% lidocaine. Small incision was made in the right neck with a blade. Micropuncture set was placed in the right internal jugular vein with ultrasound guidance. The micropuncture wire was used for measurement purposes. The right chest was anesthetized with 1% lidocaine with epinephrine. #15 blade was used to make an incision and a subcutaneous port  pocket was formed. 8 french Power Port was assembled. Subcutaneous tunnel was formed with a stiff tunneling device. The port catheter was brought through the subcutaneous tunnel. The port was placed in the subcutaneous pocket. The micropuncture set was exchanged for a peel-away sheath. The catheter was placed through the peel-away sheath and the tip was positioned at the superior cavoatrial junction. Catheter placement was confirmed with fluoroscopy. The port was accessed and flushed with heparinized saline. The port pocket was closed using two layers of absorbable sutures and Dermabond. The vein skin site was closed using a single layer of absorbable suture and Dermabond. Sterile dressings were applied. Patient tolerated the procedure well without an immediate complication. Ultrasound and fluoroscopic images were taken and saved for this procedure. IMPRESSION: Placement of a subcutaneous power-injectable port device. Catheter tip at the superior cavoatrial junction. Electronically Signed   By: Madelaine Bhat  Lowella Dandy M.D.   On: 04/30/2023 15:23   MR ABDOMEN WWO CONTRAST  Result Date: 04/25/2023 CLINICAL DATA:  Indeterminate left renal lesion identified by PET-CT, cervical cancer EXAM: MRI ABDOMEN WITHOUT AND WITH CONTRAST TECHNIQUE: Multiplanar multisequence MR imaging of the abdomen was performed both before and after the administration of intravenous contrast. CONTRAST:  8mL GADAVIST GADOBUTROL 1 MMOL/ML IV SOLN COMPARISON:  PET-CT, 04/05/2023 FINDINGS: Lower chest: No acute abnormality. Hepatobiliary: No focal liver abnormality is seen. Status post cholecystectomy. No biliary dilatation. Pancreas: Unremarkable. No pancreatic ductal dilatation or surrounding inflammatory changes. Spleen: Normal in size without significant abnormality. Adrenals/Urinary Tract: Definitively benign, macroscopic fat containing left adrenal adenoma, for which no further follow-up or characterization is required (series 5, image 37).  Intrinsically T1 hyperintense hemorrhagic or proteinaceous cyst arising from the posterior superior pole of the left kidney without associated solid component or contrast enhancement. Multiple additional fluid signal renal cortical cysts as well as innumerable very tiny subcentimeter fluid signal lesions, most likely additional tiny cysts. No calculi or hydronephrosis. Stomach/Bowel: Stomach is within normal limits. No evidence of bowel wall thickening, distention, or inflammatory changes. Vascular/Lymphatic: Aortic atherosclerosis. No enlarged abdominal lymph nodes. Other: No abdominal wall hernia or abnormality. No ascites. Musculoskeletal: No acute or significant osseous findings. IMPRESSION: 1. Benign hemorrhagic or proteinaceous cyst arising from the posterior superior pole of the left kidney without associated solid component or contrast enhancement. 2. Multiple additional fluid signal renal cortical cysts as well as innumerable very tiny subcentimeter fluid signal lesions, most likely additional tiny cysts. No specific follow-up or characterization is required for these benign Bosniak category I and II cysts. 3. Status post cholecystectomy. Aortic Atherosclerosis (ICD10-I70.0). Electronically Signed   By: Jearld Lesch M.D.   On: 04/25/2023 17:41

## 2023-05-15 NOTE — Assessment & Plan Note (Signed)
 She will need upfront dose reduction due to baseline poor renal function We discussed importance of adequate fluid hydration

## 2023-05-16 ENCOUNTER — Inpatient Hospital Stay: Payer: Medicare Other

## 2023-05-16 ENCOUNTER — Other Ambulatory Visit: Payer: Self-pay

## 2023-05-16 ENCOUNTER — Ambulatory Visit
Admission: RE | Admit: 2023-05-16 | Discharge: 2023-05-16 | Disposition: A | Discharge status: Home / Self Care | Payer: Medicare Other | Source: Ambulatory Visit | Attending: Radiation Oncology | Admitting: Radiation Oncology

## 2023-05-16 VITALS — BP 131/65 | HR 74 | Temp 98.4°F | Resp 16

## 2023-05-16 DIAGNOSIS — R296 Repeated falls: Secondary | ICD-10-CM | POA: Diagnosis not present

## 2023-05-16 DIAGNOSIS — C539 Malignant neoplasm of cervix uteri, unspecified: Secondary | ICD-10-CM

## 2023-05-16 DIAGNOSIS — N183 Chronic kidney disease, stage 3 unspecified: Secondary | ICD-10-CM | POA: Diagnosis not present

## 2023-05-16 DIAGNOSIS — Z23 Encounter for immunization: Secondary | ICD-10-CM | POA: Diagnosis not present

## 2023-05-16 DIAGNOSIS — Z5111 Encounter for antineoplastic chemotherapy: Secondary | ICD-10-CM | POA: Diagnosis not present

## 2023-05-16 DIAGNOSIS — I129 Hypertensive chronic kidney disease with stage 1 through stage 4 chronic kidney disease, or unspecified chronic kidney disease: Secondary | ICD-10-CM | POA: Diagnosis not present

## 2023-05-16 DIAGNOSIS — Z510 Encounter for antineoplastic radiation therapy: Secondary | ICD-10-CM | POA: Diagnosis not present

## 2023-05-16 LAB — RAD ONC ARIA SESSION SUMMARY
Course Elapsed Days: 6
Plan Fractions Treated to Date: 5
Plan Prescribed Dose Per Fraction: 1.8 Gy
Plan Total Fractions Prescribed: 25
Plan Total Prescribed Dose: 45 Gy
Reference Point Dosage Given to Date: 9 Gy
Reference Point Session Dosage Given: 1.8 Gy
Session Number: 5

## 2023-05-16 MED ORDER — POTASSIUM CHLORIDE IN NACL 20-0.9 MEQ/L-% IV SOLN
Freq: Once | INTRAVENOUS | Status: AC
  Filled 2023-05-16: qty 1000

## 2023-05-16 MED ORDER — SODIUM CHLORIDE 0.9 % IV SOLN
28.0000 mg/m2 | Freq: Once | INTRAVENOUS | Status: AC
  Administered 2023-05-16: 50 mg via INTRAVENOUS
  Filled 2023-05-16: qty 50

## 2023-05-16 MED ORDER — SODIUM CHLORIDE 0.9 % IV SOLN: INTRAVENOUS | Status: DC

## 2023-05-16 MED ORDER — FOSAPREPITANT DIMEGLUMINE INJECTION 150 MG
150.0000 mg | Freq: Once | INTRAVENOUS | Status: AC
  Administered 2023-05-16: 150 mg via INTRAVENOUS
  Filled 2023-05-16: qty 150

## 2023-05-16 MED ORDER — INFLUENZA VAC A&B SURF ANT ADJ 0.5 ML IM SUSY
0.5000 mL | PREFILLED_SYRINGE | Freq: Once | INTRAMUSCULAR | Status: AC
  Administered 2023-05-16: 0.5 mL via INTRAMUSCULAR
  Filled 2023-05-16: qty 0.5

## 2023-05-16 MED ORDER — PALONOSETRON HCL INJECTION 0.25 MG/5ML
0.2500 mg | Freq: Once | INTRAVENOUS | Status: AC
  Administered 2023-05-16: 0.25 mg via INTRAVENOUS
  Filled 2023-05-16: qty 5

## 2023-05-16 MED ORDER — DEXAMETHASONE SODIUM PHOSPHATE 10 MG/ML IJ SOLN
10.0000 mg | Freq: Once | INTRAMUSCULAR | Status: AC
Start: 2023-05-16 — End: 2023-05-16
  Administered 2023-05-16: 10 mg via INTRAVENOUS
  Filled 2023-05-16: qty 1

## 2023-05-16 MED ORDER — HEPARIN SOD (PORK) LOCK FLUSH 100 UNIT/ML IV SOLN
500.0000 [IU] | Freq: Once | INTRAVENOUS | Status: AC | PRN
  Administered 2023-05-16: 500 [IU]

## 2023-05-16 MED ORDER — SODIUM CHLORIDE 0.9% FLUSH
10.0000 mL | INTRAVENOUS | Status: DC | PRN
Start: 2023-05-16 — End: 2023-05-16
  Administered 2023-05-16: 10 mL

## 2023-05-16 MED ORDER — MAGNESIUM SULFATE 2 GM/50ML IV SOLN
2.0000 g | Freq: Once | INTRAVENOUS | Status: AC
Start: 2023-05-16 — End: 2023-05-16
  Administered 2023-05-16: 2 g via INTRAVENOUS
  Filled 2023-05-16: qty 50

## 2023-05-16 NOTE — Patient Instructions (Signed)
[facility]  Discharge Instructions: Thank you for choosing [facility] to provide your oncology and hematology care.   If you have a lab appointment with the Cancer Center, please go directly to the Cancer Center and check in at the registration area.   Wear comfortable clothing and clothing appropriate for easy access to any Portacath or PICC line.   We strive to give you quality time with your provider. You may need to reschedule your appointment if you arrive late (15 or more minutes).  Arriving late affects you and other patients whose appointments are after yours.  Also, if you miss three or more appointments without notifying the office, you may be dismissed from the clinic at the provider's discretion.      For prescription refill requests, have your pharmacy contact our office and allow 72 hours for refills to be completed.    Today you received the following chemotherapy and/or immunotherapy agents: cisplatin      To help prevent nausea and vomiting after your treatment, we encourage you to take your nausea medication as directed.  BELOW ARE SYMPTOMS THAT SHOULD BE REPORTED IMMEDIATELY: *FEVER GREATER THAN 100.4 F (38 C) OR HIGHER *CHILLS OR SWEATING *NAUSEA AND VOMITING THAT IS NOT CONTROLLED WITH YOUR NAUSEA MEDICATION *UNUSUAL SHORTNESS OF BREATH *UNUSUAL BRUISING OR BLEEDING *URINARY PROBLEMS (pain or burning when urinating, or frequent urination) *BOWEL PROBLEMS (unusual diarrhea, constipation, pain near the anus) TENDERNESS IN MOUTH AND THROAT WITH OR WITHOUT PRESENCE OF ULCERS (sore throat, sores in mouth, or a toothache) UNUSUAL RASH, SWELLING OR PAIN  UNUSUAL VAGINAL DISCHARGE OR ITCHING   Items with * indicate a potential emergency and should be followed up as soon as possible or go to the Emergency Department if any problems should occur.  Please show the CHEMOTHERAPY ALERT CARD or IMMUNOTHERAPY  ALERT CARD at check-in to the Emergency Department and triage nurse.  Should you have questions after your visit or need to cancel or reschedule your appointment, please contact [facility]  Dept: [phone]  and follow the prompts.  Office hours are 8:00 a.m. to 4:30 p.m. Monday - Friday. Please note that voicemails left after 4:00 p.m. may not be returned until the following business day.  We are closed weekends and major holidays. You have access to a nurse at all times for urgent questions. Please call the main number to the clinic Dept: [phone] and follow the prompts.   For any non-urgent questions, you may also contact your provider using MyChart. We now offer e-Visits for anyone 25 and older to request care online for non-urgent symptoms. For details visit [email or web address].   Also download the MyChart app! Go to the app store, search "MyChart", open the app, select [place], and log in with your MyChart username and password.

## 2023-05-16 NOTE — Progress Notes (Signed)
Patient had of output. Per Dr Bertis Ruddy, proceed with Cisplatin treatment today. Patient instructed to consume 6 cups of water today in addition to IV hydration, per Dr Bertis Ruddy.

## 2023-05-17 ENCOUNTER — Ambulatory Visit
Admission: RE | Admit: 2023-05-17 | Discharge: 2023-05-17 | Disposition: A | Discharge status: Home / Self Care | Payer: Medicare Other | Source: Ambulatory Visit | Attending: Radiation Oncology | Admitting: Radiation Oncology

## 2023-05-17 ENCOUNTER — Other Ambulatory Visit: Payer: Self-pay

## 2023-05-17 DIAGNOSIS — I129 Hypertensive chronic kidney disease with stage 1 through stage 4 chronic kidney disease, or unspecified chronic kidney disease: Secondary | ICD-10-CM | POA: Diagnosis not present

## 2023-05-17 DIAGNOSIS — R296 Repeated falls: Secondary | ICD-10-CM | POA: Diagnosis not present

## 2023-05-17 DIAGNOSIS — Z510 Encounter for antineoplastic radiation therapy: Secondary | ICD-10-CM | POA: Diagnosis not present

## 2023-05-17 DIAGNOSIS — Z23 Encounter for immunization: Secondary | ICD-10-CM | POA: Diagnosis not present

## 2023-05-17 DIAGNOSIS — Z5111 Encounter for antineoplastic chemotherapy: Secondary | ICD-10-CM | POA: Diagnosis not present

## 2023-05-17 DIAGNOSIS — N183 Chronic kidney disease, stage 3 unspecified: Secondary | ICD-10-CM | POA: Diagnosis not present

## 2023-05-17 LAB — RAD ONC ARIA SESSION SUMMARY
Course Elapsed Days: 7
Plan Fractions Treated to Date: 6
Plan Prescribed Dose Per Fraction: 1.8 Gy
Plan Total Fractions Prescribed: 25
Plan Total Prescribed Dose: 45 Gy
Reference Point Dosage Given to Date: 10.8 Gy
Reference Point Session Dosage Given: 1.8 Gy
Session Number: 6

## 2023-05-18 ENCOUNTER — Ambulatory Visit
Admission: RE | Admit: 2023-05-18 | Discharge: 2023-05-18 | Disposition: A | Discharge status: Home / Self Care | Payer: Medicare Other | Source: Ambulatory Visit | Attending: Radiation Oncology | Admitting: Radiation Oncology

## 2023-05-18 ENCOUNTER — Other Ambulatory Visit: Payer: Self-pay

## 2023-05-18 DIAGNOSIS — I129 Hypertensive chronic kidney disease with stage 1 through stage 4 chronic kidney disease, or unspecified chronic kidney disease: Secondary | ICD-10-CM | POA: Diagnosis not present

## 2023-05-18 DIAGNOSIS — N183 Chronic kidney disease, stage 3 unspecified: Secondary | ICD-10-CM | POA: Diagnosis not present

## 2023-05-18 DIAGNOSIS — R296 Repeated falls: Secondary | ICD-10-CM | POA: Diagnosis not present

## 2023-05-18 DIAGNOSIS — Z510 Encounter for antineoplastic radiation therapy: Secondary | ICD-10-CM | POA: Diagnosis not present

## 2023-05-18 DIAGNOSIS — Z5111 Encounter for antineoplastic chemotherapy: Secondary | ICD-10-CM | POA: Diagnosis not present

## 2023-05-18 DIAGNOSIS — Z23 Encounter for immunization: Secondary | ICD-10-CM | POA: Diagnosis not present

## 2023-05-18 LAB — RAD ONC ARIA SESSION SUMMARY
Course Elapsed Days: 8
Plan Fractions Treated to Date: 7
Plan Prescribed Dose Per Fraction: 1.8 Gy
Plan Total Fractions Prescribed: 25
Plan Total Prescribed Dose: 45 Gy
Reference Point Dosage Given to Date: 12.6 Gy
Reference Point Session Dosage Given: 1.8 Gy
Session Number: 7

## 2023-05-21 ENCOUNTER — Ambulatory Visit
Admission: RE | Admit: 2023-05-21 | Discharge: 2023-05-21 | Disposition: A | Discharge status: Home / Self Care | Payer: Medicare Other | Source: Ambulatory Visit | Attending: Radiation Oncology | Admitting: Radiation Oncology

## 2023-05-21 ENCOUNTER — Inpatient Hospital Stay: Payer: Medicare Other

## 2023-05-21 ENCOUNTER — Other Ambulatory Visit: Payer: Self-pay

## 2023-05-21 DIAGNOSIS — C538 Malignant neoplasm of overlapping sites of cervix uteri: Secondary | ICD-10-CM

## 2023-05-21 DIAGNOSIS — I129 Hypertensive chronic kidney disease with stage 1 through stage 4 chronic kidney disease, or unspecified chronic kidney disease: Secondary | ICD-10-CM | POA: Diagnosis not present

## 2023-05-21 DIAGNOSIS — R296 Repeated falls: Secondary | ICD-10-CM | POA: Diagnosis not present

## 2023-05-21 DIAGNOSIS — Z5111 Encounter for antineoplastic chemotherapy: Secondary | ICD-10-CM | POA: Diagnosis not present

## 2023-05-21 DIAGNOSIS — N183 Chronic kidney disease, stage 3 unspecified: Secondary | ICD-10-CM | POA: Diagnosis not present

## 2023-05-21 DIAGNOSIS — Z510 Encounter for antineoplastic radiation therapy: Secondary | ICD-10-CM | POA: Diagnosis not present

## 2023-05-21 DIAGNOSIS — Z23 Encounter for immunization: Secondary | ICD-10-CM | POA: Diagnosis not present

## 2023-05-21 DIAGNOSIS — C539 Malignant neoplasm of cervix uteri, unspecified: Secondary | ICD-10-CM

## 2023-05-21 LAB — CBC WITH DIFFERENTIAL (CANCER CENTER ONLY)
Abs Immature Granulocytes: 0.02 10*3/uL (ref 0.00–0.07)
Basophils Absolute: 0 10*3/uL (ref 0.0–0.1)
Basophils Relative: 0 %
Eosinophils Absolute: 0.6 10*3/uL — ABNORMAL HIGH (ref 0.0–0.5)
Eosinophils Relative: 9 %
HCT: 37 % (ref 36.0–46.0)
Hemoglobin: 12.4 g/dL (ref 12.0–15.0)
Immature Granulocytes: 0 %
Lymphocytes Relative: 8 %
Lymphs Abs: 0.5 10*3/uL — ABNORMAL LOW (ref 0.7–4.0)
MCH: 34 pg (ref 26.0–34.0)
MCHC: 33.5 g/dL (ref 30.0–36.0)
MCV: 101.4 fL — ABNORMAL HIGH (ref 80.0–100.0)
Monocytes Absolute: 0.5 10*3/uL (ref 0.1–1.0)
Monocytes Relative: 8 %
Neutro Abs: 4.6 10*3/uL (ref 1.7–7.7)
Neutrophils Relative %: 75 %
Platelet Count: 181 10*3/uL (ref 150–400)
RBC: 3.65 MIL/uL — ABNORMAL LOW (ref 3.87–5.11)
RDW: 11.9 % (ref 11.5–15.5)
WBC Count: 6.1 10*3/uL (ref 4.0–10.5)
nRBC: 0 % (ref 0.0–0.2)

## 2023-05-21 LAB — BASIC METABOLIC PANEL - CANCER CENTER ONLY
Anion gap: 5 (ref 5–15)
BUN: 23 mg/dL (ref 8–23)
CO2: 26 mmol/L (ref 22–32)
Calcium: 9.7 mg/dL (ref 8.9–10.3)
Chloride: 106 mmol/L (ref 98–111)
Creatinine: 1.49 mg/dL — ABNORMAL HIGH (ref 0.44–1.00)
GFR, Estimated: 36 mL/min — ABNORMAL LOW (ref >60)
Glucose, Bld: 163 mg/dL — ABNORMAL HIGH (ref 70–99)
Potassium: 4.1 mmol/L (ref 3.5–5.1)
Sodium: 137 mmol/L (ref 135–145)

## 2023-05-21 LAB — RAD ONC ARIA SESSION SUMMARY
Course Elapsed Days: 11
Plan Fractions Treated to Date: 8
Plan Prescribed Dose Per Fraction: 1.8 Gy
Plan Total Fractions Prescribed: 25
Plan Total Prescribed Dose: 45 Gy
Reference Point Dosage Given to Date: 14.4 Gy
Reference Point Session Dosage Given: 1.8 Gy
Session Number: 8

## 2023-05-21 LAB — MAGNESIUM: Magnesium: 1.7 mg/dL (ref 1.7–2.4)

## 2023-05-21 MED ORDER — SODIUM CHLORIDE 0.9% FLUSH
10.0000 mL | Freq: Once | INTRAVENOUS | Status: AC
  Administered 2023-05-21: 10 mL

## 2023-05-21 MED ORDER — HEPARIN SOD (PORK) LOCK FLUSH 100 UNIT/ML IV SOLN
500.0000 [IU] | Freq: Once | INTRAVENOUS | Status: AC
  Administered 2023-05-21: 500 [IU]

## 2023-05-22 ENCOUNTER — Ambulatory Visit
Admission: RE | Admit: 2023-05-22 | Discharge: 2023-05-22 | Disposition: A | Discharge status: Home / Self Care | Payer: Medicare Other | Source: Ambulatory Visit | Attending: Radiation Oncology | Admitting: Radiation Oncology

## 2023-05-22 ENCOUNTER — Other Ambulatory Visit: Payer: Self-pay

## 2023-05-22 ENCOUNTER — Other Ambulatory Visit: Payer: Medicare Other

## 2023-05-22 ENCOUNTER — Inpatient Hospital Stay (HOSPITAL_BASED_OUTPATIENT_CLINIC_OR_DEPARTMENT_OTHER): Payer: Medicare Other | Admitting: Hematology and Oncology

## 2023-05-22 ENCOUNTER — Encounter: Payer: Self-pay | Admitting: Hematology and Oncology

## 2023-05-22 VITALS — BP 128/68 | HR 77 | Temp 99.7°F | Resp 18 | Ht 63.5 in | Wt 164.0 lb

## 2023-05-22 DIAGNOSIS — R110 Nausea: Secondary | ICD-10-CM | POA: Diagnosis not present

## 2023-05-22 DIAGNOSIS — N1832 Chronic kidney disease, stage 3b: Secondary | ICD-10-CM | POA: Diagnosis not present

## 2023-05-22 DIAGNOSIS — N183 Chronic kidney disease, stage 3 unspecified: Secondary | ICD-10-CM | POA: Diagnosis not present

## 2023-05-22 DIAGNOSIS — I129 Hypertensive chronic kidney disease with stage 1 through stage 4 chronic kidney disease, or unspecified chronic kidney disease: Secondary | ICD-10-CM | POA: Diagnosis not present

## 2023-05-22 DIAGNOSIS — C538 Malignant neoplasm of overlapping sites of cervix uteri: Secondary | ICD-10-CM | POA: Diagnosis not present

## 2023-05-22 DIAGNOSIS — Z23 Encounter for immunization: Secondary | ICD-10-CM | POA: Diagnosis not present

## 2023-05-22 DIAGNOSIS — Z5111 Encounter for antineoplastic chemotherapy: Secondary | ICD-10-CM | POA: Diagnosis not present

## 2023-05-22 DIAGNOSIS — R296 Repeated falls: Secondary | ICD-10-CM | POA: Diagnosis not present

## 2023-05-22 DIAGNOSIS — Z510 Encounter for antineoplastic radiation therapy: Secondary | ICD-10-CM | POA: Diagnosis not present

## 2023-05-22 LAB — RAD ONC ARIA SESSION SUMMARY
Course Elapsed Days: 12
Plan Fractions Treated to Date: 9
Plan Prescribed Dose Per Fraction: 1.8 Gy
Plan Total Fractions Prescribed: 25
Plan Total Prescribed Dose: 45 Gy
Reference Point Dosage Given to Date: 16.2 Gy
Reference Point Session Dosage Given: 1.8 Gy
Session Number: 9

## 2023-05-22 MED FILL — Fosaprepitant Dimeglumine For IV Infusion 150 MG (Base Eq): INTRAVENOUS | Qty: 5 | Status: AC

## 2023-05-22 NOTE — Assessment & Plan Note (Signed)
She tolerated treatment well without major side effects We discussed test results Her renal function is stable We discussed importance of adequate hydration I will see her weekly for further follow-up

## 2023-05-22 NOTE — Assessment & Plan Note (Signed)
To be expected side effects of treatment She will continue antiemetics as needed

## 2023-05-22 NOTE — Assessment & Plan Note (Signed)
She will continue on mildly reduced dose treatment Overall, her renal function is stable We discussed importance of adequate fluid hydration

## 2023-05-22 NOTE — Telephone Encounter (Signed)
Called patient and scheduled 6 week appts for f/u visit.

## 2023-05-22 NOTE — Progress Notes (Signed)
[facility] OFFICE PROGRESS NOTE  Patient Care Team: Corwin Levins, MD as PCP - General (Internal Medicine) Janet Berlin, MD as Consulting Physician (Ophthalmology) Rosalyn Gess, MD as Consulting Physician (Gynecology)  ASSESSMENT & PLAN:  Malignant neoplasm of cervix [facility]) She tolerated treatment well without major side effects We discussed test results Her renal function is stable We discussed importance of adequate hydration I will see her weekly for further follow-up  CKD (chronic kidney disease) stage 3, GFR 30-59 ml/min (HCC) She will continue on mildly reduced dose treatment Overall, her renal function is stable We discussed importance of adequate fluid hydration  Nausea without vomiting To be expected side effects of treatment She will continue antiemetics as needed  No orders of the defined types were placed in this encounter.   All questions were answered. The patient knows to call the clinic with any problems, questions or concerns. The total time spent in the appointment was 20 minutes encounter with patients including review of chart and various tests results, discussions about plan of care and coordination of care plan   Artis Delay, MD 05/22/2023 12:58 PM  INTERVAL HISTORY: Please see below for problem oriented charting. she returns for weekly chemotherapy toxicity review She is here accompanied by her son She has some mild nausea but no vomiting She have some loose stool No worsening hearing loss or neuropathy  REVIEW OF SYSTEMS:   Constitutional: Denies fevers, chills or abnormal weight loss Eyes: Denies blurriness of vision Ears, nose, mouth, throat, and face: Denies mucositis or sore throat Respiratory: Denies cough, dyspnea or wheezes Cardiovascular: Denies palpitation, chest discomfort or lower extremity swelling Gastrointestinal:  Denies nausea, heartburn or change in bowel habits Skin: Denies abnormal skin rashes Lymphatics:  Denies new lymphadenopathy or easy bruising Neurological:Denies numbness, tingling or new weaknesses Behavioral/Psych: Mood is stable, no new changes  All other systems were reviewed with the patient and are negative.  I have reviewed the past medical history, past surgical history, social history and family history with the patient and they are unchanged from previous note.  ALLERGIES:  is allergic to penicillins.  MEDICATIONS:  Current Outpatient Medications  Medication Sig Dispense Refill   aspirin EC 81 MG tablet Take 81 mg by mouth daily.     Cholecalciferol (VITAMIN D3) 2000 units TABS Take by mouth.     divalproex (DEPAKOTE ER) 250 MG 24 hr tablet Take 3 tablets (750 mg total) by mouth every morning. 30 tablet 0   ezetimibe (ZETIA) 10 MG tablet TAKE 1 TABLET BY MOUTH EVERY DAY 90 tablet 3   famotidine (PEPCID) 20 MG tablet TAKE 1 TABLET BY MOUTH TWICE A DAY 180 tablet 3   FLUoxetine (PROZAC) 10 MG capsule Take 10 mg by mouth daily. Takes 2 one time daily  2   INGREZZA 60 MG capsule Take 60 mg by mouth daily.     lidocaine-prilocaine (EMLA) cream Apply 1 Application topically as needed. (Patient taking differently: Apply 1 Application topically as needed. Hasn't started) 30 g 3   linaclotide (LINZESS) 145 MCG CAPS capsule Take 1 capsule (145 mcg total) by mouth daily as needed. 30 capsule 11   multivitamin-iron-minerals-folic acid (CENTRUM) chewable tablet Chew 1 tablet by mouth daily.     MYRBETRIQ 50 MG TB24 tablet TAKE 1 TABLET BY MOUTH EVERY DAY 90 tablet 3   Omega-3 Fatty Acids (FISH OIL) 1000 MG CAPS Take 2 capsules by mouth daily.     ondansetron (ZOFRAN) 8 MG tablet  Take 1 tablet (8 mg total) by mouth every 8 (eight) hours as needed for nausea. 30 tablet 3   pravastatin (PRAVACHOL) 40 MG tablet TAKE 1 TABLET BY MOUTH EVERY DAY 90 tablet 3   prochlorperazine (COMPAZINE) 10 MG tablet Take 1 tablet (10 mg total) by mouth every 6 (six) hours as needed for nausea or vomiting. 30  tablet 3   triamcinolone cream (KENALOG) 0.1 % APPLY TO AFFECTED AREA TWICE A DAY 30 g 1   trimethoprim (TRIMPEX) 100 MG tablet Take 1 tablet (100 mg total) by mouth daily. 90 tablet 3   zolpidem (AMBIEN) 5 MG tablet Take 1 tablet (5 mg total) by mouth at bedtime as needed for sleep. 30 tablet 5   No current facility-administered medications for this visit.    SUMMARY OF ONCOLOGIC HISTORY: Oncology History Overview Note  PD-L1 is negative, CPS <1%   Malignant neoplasm of cervix (HCC)  03/08/2023 Initial Diagnosis   Presented to OB/GYN for postmenopausal bleeding. Pap with HSIL, HPV 16 positive   03/26/2023 Initial Biopsy   Cervical biopsy: A. ANTERIOR RIGHT CERVICAL MASS, BIOPSY:       Invasive squamous cell carcinoma, well-differentiated,  keratinizing.    04/05/2023 Cancer Staging   Staging form: Cervix Uteri, AJCC Version 9 - Clinical stage from 04/05/2023: FIGO Stage IIIC1r, calculated as Stage IIIC1 (cT2b, cN1, cM0) - Signed by Clide Cliff, MD on 04/16/2023 Histopathologic type: Squamous cell carcinoma, NOS Stage prefix: Initial diagnosis Stage confirmation method: Imaging Pelvic nodal status: Positive Pelvic nodal method of assessment: PET Para-aortic status: Negative Para-aortic nodal method of assessment: PET Histologic grade (G): G1 Histologic grading system: 3 grade system   04/05/2023 Imaging   PET: IMPRESSION: 1. The cervical mass is hypermetabolic compatible with primary cervical carcinoma. 2. 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 3. No signs of solid organ metastasis. 4. Possible exophytic lesion off the upper pole of the left kidney measuring 2 cm. Difficult to confirm within the limitations of nondiagnostic CT and unenhanced technique. Consider further evaluation with renal ultrasound. 5. Coronary artery calcifications. 6.  Aortic Atherosclerosis (ICD10-I70.0).   04/30/2023 Procedure   Placement of a  subcutaneous power-injectable port device. Catheter tip at the superior cavoatrial junction.   05/09/2023 -  Chemotherapy   Patient is on Treatment Plan : Cervical cancer Cisplatin (35) q7d + XRT x 6 cycles       PHYSICAL EXAMINATION: ECOG PERFORMANCE STATUS: 1 - Symptomatic but completely ambulatory  Vitals:   05/22/23 1119  BP: 128/68  Pulse: 77  Resp: 18  Temp: 99.7 F (37.6 C)  SpO2: 94%   Filed Weights   05/22/23 1119  Weight: 164 lb (74.4 kg)    GENERAL:alert, no distress and comfortable  NEURO: alert & oriented x 3 with fluent speech, no focal motor/sensory deficits  LABORATORY DATA:  I have reviewed the data as listed    Component Value Date/Time   NA 137 05/21/2023 1119   K 4.1 05/21/2023 1119   CL 106 05/21/2023 1119   CO2 26 05/21/2023 1119   GLUCOSE 163 (H) 05/21/2023 1119   BUN 23 05/21/2023 1119   CREATININE 1.49 (H) 05/21/2023 1119   CREATININE 1.44 (H) 12/11/2019 1445   CALCIUM 9.7 05/21/2023 1119   PROT 7.4 12/18/2022 1125   ALBUMIN 4.2 12/18/2022 1125   AST 23 12/18/2022 1125   ALT 21 12/18/2022 1125   ALKPHOS 64 12/18/2022 1125   BILITOT 0.4 12/18/2022 1125   GFRNONAA 36 (  L) 05/21/2023 1119   GFRAA 53 (L) 07/31/2017 1106    No results found for: "SPEP", "UPEP"  Lab Results  Component Value Date   WBC 6.1 05/21/2023   NEUTROABS 4.6 05/21/2023   HGB 12.4 05/21/2023   HCT 37.0 05/21/2023   MCV 101.4 (H) 05/21/2023   PLT 181 05/21/2023      Chemistry      Component Value Date/Time   NA 137 05/21/2023 1119   K 4.1 05/21/2023 1119   CL 106 05/21/2023 1119   CO2 26 05/21/2023 1119   BUN 23 05/21/2023 1119   CREATININE 1.49 (H) 05/21/2023 1119   CREATININE 1.44 (H) 12/11/2019 1445      Component Value Date/Time   CALCIUM 9.7 05/21/2023 1119   ALKPHOS 64 12/18/2022 1125   AST 23 12/18/2022 1125   ALT 21 12/18/2022 1125   BILITOT 0.4 12/18/2022 1125

## 2023-05-23 ENCOUNTER — Inpatient Hospital Stay: Payer: Medicare Other

## 2023-05-23 ENCOUNTER — Other Ambulatory Visit: Payer: Self-pay

## 2023-05-23 ENCOUNTER — Ambulatory Visit
Admission: RE | Admit: 2023-05-23 | Discharge: 2023-05-23 | Disposition: A | Discharge status: Home / Self Care | Payer: Medicare Other | Source: Ambulatory Visit | Attending: Radiation Oncology | Admitting: Radiation Oncology

## 2023-05-23 VITALS — BP 128/64 | HR 72 | Temp 98.5°F | Resp 18

## 2023-05-23 DIAGNOSIS — C539 Malignant neoplasm of cervix uteri, unspecified: Secondary | ICD-10-CM

## 2023-05-23 DIAGNOSIS — I129 Hypertensive chronic kidney disease with stage 1 through stage 4 chronic kidney disease, or unspecified chronic kidney disease: Secondary | ICD-10-CM | POA: Diagnosis not present

## 2023-05-23 DIAGNOSIS — R296 Repeated falls: Secondary | ICD-10-CM | POA: Diagnosis not present

## 2023-05-23 DIAGNOSIS — N183 Chronic kidney disease, stage 3 unspecified: Secondary | ICD-10-CM | POA: Diagnosis not present

## 2023-05-23 DIAGNOSIS — Z510 Encounter for antineoplastic radiation therapy: Secondary | ICD-10-CM | POA: Diagnosis not present

## 2023-05-23 DIAGNOSIS — Z23 Encounter for immunization: Secondary | ICD-10-CM | POA: Diagnosis not present

## 2023-05-23 DIAGNOSIS — Z5111 Encounter for antineoplastic chemotherapy: Secondary | ICD-10-CM | POA: Diagnosis not present

## 2023-05-23 LAB — RAD ONC ARIA SESSION SUMMARY
Course Elapsed Days: 13
Plan Fractions Treated to Date: 10
Plan Prescribed Dose Per Fraction: 1.8 Gy
Plan Total Fractions Prescribed: 25
Plan Total Prescribed Dose: 45 Gy
Reference Point Dosage Given to Date: 18 Gy
Reference Point Session Dosage Given: 1.8 Gy
Session Number: 10

## 2023-05-23 MED ORDER — DEXAMETHASONE SODIUM PHOSPHATE 10 MG/ML IJ SOLN
10.0000 mg | Freq: Once | INTRAMUSCULAR | Status: AC
Start: 2023-05-23 — End: 2023-05-23
  Administered 2023-05-23: 10 mg via INTRAVENOUS
  Filled 2023-05-23: qty 1

## 2023-05-23 MED ORDER — SODIUM CHLORIDE 0.9 % IV SOLN
28.0000 mg/m2 | Freq: Once | INTRAVENOUS | Status: AC
  Administered 2023-05-23: 50 mg via INTRAVENOUS
  Filled 2023-05-23: qty 50

## 2023-05-23 MED ORDER — HEPARIN SOD (PORK) LOCK FLUSH 100 UNIT/ML IV SOLN
500.0000 [IU] | Freq: Once | INTRAVENOUS | Status: AC | PRN
Start: 2023-05-23 — End: 2023-05-23
  Administered 2023-05-23: 500 [IU]

## 2023-05-23 MED ORDER — SODIUM CHLORIDE 0.9 % IV SOLN: INTRAVENOUS | Status: DC

## 2023-05-23 MED ORDER — SODIUM CHLORIDE 0.9% FLUSH
10.0000 mL | INTRAVENOUS | Status: DC | PRN
  Administered 2023-05-23: 10 mL

## 2023-05-23 MED ORDER — FOSAPREPITANT DIMEGLUMINE INJECTION 150 MG
150.0000 mg | Freq: Once | INTRAVENOUS | Status: AC
  Administered 2023-05-23: 150 mg via INTRAVENOUS
  Filled 2023-05-23: qty 150

## 2023-05-23 MED ORDER — POTASSIUM CHLORIDE IN NACL 20-0.9 MEQ/L-% IV SOLN
Freq: Once | INTRAVENOUS | Status: AC
  Filled 2023-05-23: qty 1000

## 2023-05-23 MED ORDER — PALONOSETRON HCL INJECTION 0.25 MG/5ML
0.2500 mg | Freq: Once | INTRAVENOUS | Status: AC
  Administered 2023-05-23: 0.25 mg via INTRAVENOUS
  Filled 2023-05-23: qty 5

## 2023-05-23 MED ORDER — MAGNESIUM SULFATE 2 GM/50ML IV SOLN
2.0000 g | Freq: Once | INTRAVENOUS | Status: AC
  Administered 2023-05-23: 2 g via INTRAVENOUS
  Filled 2023-05-23: qty 50

## 2023-05-23 NOTE — Patient Instructions (Signed)
 [facility]  Discharge Instructions: Thank you for choosing [facility] to provide your oncology and hematology care.   If you have a lab appointment with the Cancer Center, please go directly to the Cancer Center and check in at the registration area.   Wear comfortable clothing and clothing appropriate for easy access to any Portacath or PICC line.   We strive to give you quality time with your provider. You may need to reschedule your appointment if you arrive late (15 or more minutes).  Arriving late affects you and other patients whose appointments are after yours.  Also, if you miss three or more appointments without notifying the office, you may be dismissed from the clinic at the provider's discretion.      For prescription refill requests, have your pharmacy contact our office and allow 72 hours for refills to be completed.    Today you received the following chemotherapy and/or immunotherapy agents: cisplatin      To help prevent nausea and vomiting after your treatment, we encourage you to take your nausea medication as directed.  BELOW ARE SYMPTOMS THAT SHOULD BE REPORTED IMMEDIATELY: *FEVER GREATER THAN 100.4 F (38 C) OR HIGHER *CHILLS OR SWEATING *NAUSEA AND VOMITING THAT IS NOT CONTROLLED WITH YOUR NAUSEA MEDICATION *UNUSUAL SHORTNESS OF BREATH *UNUSUAL BRUISING OR BLEEDING *URINARY PROBLEMS (pain or burning when urinating, or frequent urination) *BOWEL PROBLEMS (unusual diarrhea, constipation, pain near the anus) TENDERNESS IN MOUTH AND THROAT WITH OR WITHOUT PRESENCE OF ULCERS (sore throat, sores in mouth, or a toothache) UNUSUAL RASH, SWELLING OR PAIN  UNUSUAL VAGINAL DISCHARGE OR ITCHING   Items with * indicate a potential emergency and should be followed up as soon as possible or go to the Emergency Department if any problems should occur.  Please show the CHEMOTHERAPY ALERT CARD or IMMUNOTHERAPY  ALERT CARD at check-in to the Emergency Department and triage nurse.  Should you have questions after your visit or need to cancel or reschedule your appointment, please contact [facility]  Dept: [phone]  and follow the prompts.  Office hours are 8:00 a.m. to 4:30 p.m. Monday - Friday. Please note that voicemails left after 4:00 p.m. may not be returned until the following business day.  We are closed weekends and major holidays. You have access to a nurse at all times for urgent questions. Please call the main number to the clinic Dept: [phone] and follow the prompts.   For any non-urgent questions, you may also contact your provider using MyChart. We now offer e-Visits for anyone 25 and older to request care online for non-urgent symptoms. For details visit [email or web address].   Also download the MyChart app! Go to the app store, search "MyChart", open the app, select [place], and log in with your MyChart username and password.

## 2023-05-23 NOTE — Progress Notes (Signed)
Pt had 100cc urine output after 2 hours pre hydration. Dr. Bertis Ruddy notified. Ok to proceed with treatment. Pt educated to drink one cup of water every 30 minutes per Dr. Maxine Glenn instructions. Pt verbalizes understanding

## 2023-05-24 ENCOUNTER — Other Ambulatory Visit: Payer: Self-pay

## 2023-05-24 ENCOUNTER — Ambulatory Visit
Admission: RE | Admit: 2023-05-24 | Discharge: 2023-05-24 | Disposition: A | Discharge status: Home / Self Care | Payer: Medicare Other | Source: Ambulatory Visit | Attending: Radiation Oncology | Admitting: Radiation Oncology

## 2023-05-24 DIAGNOSIS — Z23 Encounter for immunization: Secondary | ICD-10-CM | POA: Diagnosis not present

## 2023-05-24 DIAGNOSIS — Z510 Encounter for antineoplastic radiation therapy: Secondary | ICD-10-CM | POA: Diagnosis not present

## 2023-05-24 DIAGNOSIS — N183 Chronic kidney disease, stage 3 unspecified: Secondary | ICD-10-CM | POA: Diagnosis not present

## 2023-05-24 DIAGNOSIS — I129 Hypertensive chronic kidney disease with stage 1 through stage 4 chronic kidney disease, or unspecified chronic kidney disease: Secondary | ICD-10-CM | POA: Diagnosis not present

## 2023-05-24 DIAGNOSIS — R296 Repeated falls: Secondary | ICD-10-CM | POA: Diagnosis not present

## 2023-05-24 DIAGNOSIS — Z5111 Encounter for antineoplastic chemotherapy: Secondary | ICD-10-CM | POA: Diagnosis not present

## 2023-05-24 LAB — RAD ONC ARIA SESSION SUMMARY
Course Elapsed Days: 14
Plan Fractions Treated to Date: 11
Plan Prescribed Dose Per Fraction: 1.8 Gy
Plan Total Fractions Prescribed: 25
Plan Total Prescribed Dose: 45 Gy
Reference Point Dosage Given to Date: 19.8 Gy
Reference Point Session Dosage Given: 1.8 Gy
Session Number: 11

## 2023-05-25 ENCOUNTER — Other Ambulatory Visit: Payer: Self-pay

## 2023-05-25 ENCOUNTER — Ambulatory Visit
Admission: RE | Admit: 2023-05-25 | Discharge: 2023-05-25 | Disposition: A | Discharge status: Home / Self Care | Payer: Medicare Other | Source: Ambulatory Visit | Attending: Radiation Oncology | Admitting: Radiation Oncology

## 2023-05-25 DIAGNOSIS — N183 Chronic kidney disease, stage 3 unspecified: Secondary | ICD-10-CM | POA: Diagnosis not present

## 2023-05-25 DIAGNOSIS — Z23 Encounter for immunization: Secondary | ICD-10-CM | POA: Diagnosis not present

## 2023-05-25 DIAGNOSIS — I129 Hypertensive chronic kidney disease with stage 1 through stage 4 chronic kidney disease, or unspecified chronic kidney disease: Secondary | ICD-10-CM | POA: Diagnosis not present

## 2023-05-25 DIAGNOSIS — Z510 Encounter for antineoplastic radiation therapy: Secondary | ICD-10-CM | POA: Diagnosis not present

## 2023-05-25 DIAGNOSIS — R296 Repeated falls: Secondary | ICD-10-CM | POA: Diagnosis not present

## 2023-05-25 DIAGNOSIS — Z5111 Encounter for antineoplastic chemotherapy: Secondary | ICD-10-CM | POA: Diagnosis not present

## 2023-05-25 LAB — RAD ONC ARIA SESSION SUMMARY
Course Elapsed Days: 15
Plan Fractions Treated to Date: 12
Plan Prescribed Dose Per Fraction: 1.8 Gy
Plan Total Fractions Prescribed: 25
Plan Total Prescribed Dose: 45 Gy
Reference Point Dosage Given to Date: 21.6 Gy
Reference Point Session Dosage Given: 1.8 Gy
Session Number: 12

## 2023-05-28 ENCOUNTER — Ambulatory Visit
Admission: RE | Admit: 2023-05-28 | Discharge: 2023-05-28 | Disposition: A | Discharge status: Home / Self Care | Payer: Medicare Other | Source: Ambulatory Visit | Attending: Radiation Oncology | Admitting: Radiation Oncology

## 2023-05-28 ENCOUNTER — Inpatient Hospital Stay: Payer: Medicare Other

## 2023-05-28 ENCOUNTER — Other Ambulatory Visit: Payer: Self-pay

## 2023-05-28 DIAGNOSIS — Z23 Encounter for immunization: Secondary | ICD-10-CM | POA: Diagnosis not present

## 2023-05-28 DIAGNOSIS — N183 Chronic kidney disease, stage 3 unspecified: Secondary | ICD-10-CM | POA: Diagnosis not present

## 2023-05-28 DIAGNOSIS — I129 Hypertensive chronic kidney disease with stage 1 through stage 4 chronic kidney disease, or unspecified chronic kidney disease: Secondary | ICD-10-CM | POA: Diagnosis not present

## 2023-05-28 DIAGNOSIS — C538 Malignant neoplasm of overlapping sites of cervix uteri: Secondary | ICD-10-CM

## 2023-05-28 DIAGNOSIS — Z510 Encounter for antineoplastic radiation therapy: Secondary | ICD-10-CM | POA: Diagnosis not present

## 2023-05-28 DIAGNOSIS — Z5111 Encounter for antineoplastic chemotherapy: Secondary | ICD-10-CM | POA: Diagnosis not present

## 2023-05-28 DIAGNOSIS — C539 Malignant neoplasm of cervix uteri, unspecified: Secondary | ICD-10-CM

## 2023-05-28 DIAGNOSIS — R296 Repeated falls: Secondary | ICD-10-CM | POA: Diagnosis not present

## 2023-05-28 LAB — CBC WITH DIFFERENTIAL (CANCER CENTER ONLY)
Abs Immature Granulocytes: 0.03 10*3/uL (ref 0.00–0.07)
Basophils Absolute: 0 10*3/uL (ref 0.0–0.1)
Basophils Relative: 0 %
Eosinophils Absolute: 0.7 10*3/uL — ABNORMAL HIGH (ref 0.0–0.5)
Eosinophils Relative: 12 %
HCT: 35.2 % — ABNORMAL LOW (ref 36.0–46.0)
Hemoglobin: 12 g/dL (ref 12.0–15.0)
Immature Granulocytes: 1 %
Lymphocytes Relative: 7 %
Lymphs Abs: 0.4 10*3/uL — ABNORMAL LOW (ref 0.7–4.0)
MCH: 34.4 pg — ABNORMAL HIGH (ref 26.0–34.0)
MCHC: 34.1 g/dL (ref 30.0–36.0)
MCV: 100.9 fL — ABNORMAL HIGH (ref 80.0–100.0)
Monocytes Absolute: 0.5 10*3/uL (ref 0.1–1.0)
Monocytes Relative: 8 %
Neutro Abs: 4.1 10*3/uL (ref 1.7–7.7)
Neutrophils Relative %: 72 %
Platelet Count: 147 10*3/uL — ABNORMAL LOW (ref 150–400)
RBC: 3.49 MIL/uL — ABNORMAL LOW (ref 3.87–5.11)
RDW: 12.1 % (ref 11.5–15.5)
WBC Count: 5.7 10*3/uL (ref 4.0–10.5)
nRBC: 0 % (ref 0.0–0.2)

## 2023-05-28 LAB — RAD ONC ARIA SESSION SUMMARY
Course Elapsed Days: 18
Plan Fractions Treated to Date: 13
Plan Prescribed Dose Per Fraction: 1.8 Gy
Plan Total Fractions Prescribed: 25
Plan Total Prescribed Dose: 45 Gy
Reference Point Dosage Given to Date: 23.4 Gy
Reference Point Session Dosage Given: 1.8 Gy
Session Number: 13

## 2023-05-28 LAB — BASIC METABOLIC PANEL - CANCER CENTER ONLY
Anion gap: 6 (ref 5–15)
BUN: 17 mg/dL (ref 8–23)
CO2: 27 mmol/L (ref 22–32)
Calcium: 9.6 mg/dL (ref 8.9–10.3)
Chloride: 105 mmol/L (ref 98–111)
Creatinine: 1.51 mg/dL — ABNORMAL HIGH (ref 0.44–1.00)
GFR, Estimated: 35 mL/min — ABNORMAL LOW (ref >60)
Glucose, Bld: 124 mg/dL — ABNORMAL HIGH (ref 70–99)
Potassium: 4.6 mmol/L (ref 3.5–5.1)
Sodium: 138 mmol/L (ref 135–145)

## 2023-05-28 LAB — MAGNESIUM: Magnesium: 1.6 mg/dL — ABNORMAL LOW (ref 1.7–2.4)

## 2023-05-28 MED ORDER — SODIUM CHLORIDE 0.9% FLUSH
10.0000 mL | Freq: Once | INTRAVENOUS | Status: AC
  Administered 2023-05-28: 10 mL

## 2023-05-28 MED ORDER — HEPARIN SOD (PORK) LOCK FLUSH 100 UNIT/ML IV SOLN
500.0000 [IU] | Freq: Once | INTRAVENOUS | Status: AC
  Administered 2023-05-28: 500 [IU]

## 2023-05-29 ENCOUNTER — Other Ambulatory Visit: Payer: Self-pay

## 2023-05-29 ENCOUNTER — Other Ambulatory Visit: Payer: Medicare Other

## 2023-05-29 ENCOUNTER — Ambulatory Visit
Admission: RE | Admit: 2023-05-29 | Discharge: 2023-05-29 | Discharge status: Home / Self Care | Payer: Medicare Other | Source: Ambulatory Visit | Attending: Radiation Oncology | Admitting: Radiation Oncology

## 2023-05-29 DIAGNOSIS — R296 Repeated falls: Secondary | ICD-10-CM | POA: Diagnosis not present

## 2023-05-29 DIAGNOSIS — Z510 Encounter for antineoplastic radiation therapy: Secondary | ICD-10-CM | POA: Diagnosis not present

## 2023-05-29 DIAGNOSIS — Z23 Encounter for immunization: Secondary | ICD-10-CM | POA: Diagnosis not present

## 2023-05-29 DIAGNOSIS — N183 Chronic kidney disease, stage 3 unspecified: Secondary | ICD-10-CM | POA: Diagnosis not present

## 2023-05-29 DIAGNOSIS — Z5111 Encounter for antineoplastic chemotherapy: Secondary | ICD-10-CM | POA: Diagnosis not present

## 2023-05-29 DIAGNOSIS — I129 Hypertensive chronic kidney disease with stage 1 through stage 4 chronic kidney disease, or unspecified chronic kidney disease: Secondary | ICD-10-CM | POA: Diagnosis not present

## 2023-05-29 LAB — RAD ONC ARIA SESSION SUMMARY
Course Elapsed Days: 19
Plan Fractions Treated to Date: 14
Plan Prescribed Dose Per Fraction: 1.8 Gy
Plan Total Fractions Prescribed: 25
Plan Total Prescribed Dose: 45 Gy
Reference Point Dosage Given to Date: 25.2 Gy
Reference Point Session Dosage Given: 1.8 Gy
Session Number: 14

## 2023-05-29 MED FILL — Fosaprepitant Dimeglumine For IV Infusion 150 MG (Base Eq): INTRAVENOUS | Qty: 5 | Status: AC

## 2023-05-31 ENCOUNTER — Other Ambulatory Visit: Payer: Self-pay

## 2023-05-31 ENCOUNTER — Ambulatory Visit
Admission: RE | Admit: 2023-05-31 | Discharge: 2023-05-31 | Disposition: A | Discharge status: Home / Self Care | Payer: Medicare Other | Source: Ambulatory Visit | Attending: Radiation Oncology | Admitting: Radiation Oncology

## 2023-05-31 ENCOUNTER — Inpatient Hospital Stay: Payer: Medicare Other

## 2023-05-31 VITALS — BP 126/66 | HR 80 | Temp 98.0°F | Wt 150.8 lb

## 2023-05-31 DIAGNOSIS — I129 Hypertensive chronic kidney disease with stage 1 through stage 4 chronic kidney disease, or unspecified chronic kidney disease: Secondary | ICD-10-CM | POA: Diagnosis not present

## 2023-05-31 DIAGNOSIS — N183 Chronic kidney disease, stage 3 unspecified: Secondary | ICD-10-CM | POA: Diagnosis not present

## 2023-05-31 DIAGNOSIS — Z5111 Encounter for antineoplastic chemotherapy: Secondary | ICD-10-CM | POA: Diagnosis not present

## 2023-05-31 DIAGNOSIS — Z23 Encounter for immunization: Secondary | ICD-10-CM | POA: Diagnosis not present

## 2023-05-31 DIAGNOSIS — Z510 Encounter for antineoplastic radiation therapy: Secondary | ICD-10-CM | POA: Diagnosis not present

## 2023-05-31 DIAGNOSIS — C539 Malignant neoplasm of cervix uteri, unspecified: Secondary | ICD-10-CM

## 2023-05-31 DIAGNOSIS — R296 Repeated falls: Secondary | ICD-10-CM | POA: Diagnosis not present

## 2023-05-31 LAB — RAD ONC ARIA SESSION SUMMARY
Course Elapsed Days: 21
Plan Fractions Treated to Date: 15
Plan Prescribed Dose Per Fraction: 1.8 Gy
Plan Total Fractions Prescribed: 25
Plan Total Prescribed Dose: 45 Gy
Reference Point Dosage Given to Date: 27 Gy
Reference Point Session Dosage Given: 1.8 Gy
Session Number: 15

## 2023-05-31 MED ORDER — FOSAPREPITANT DIMEGLUMINE INJECTION 150 MG
150.0000 mg | Freq: Once | INTRAVENOUS | Status: AC
  Administered 2023-05-31: 150 mg via INTRAVENOUS
  Filled 2023-05-31: qty 150

## 2023-05-31 MED ORDER — HEPARIN SOD (PORK) LOCK FLUSH 100 UNIT/ML IV SOLN
500.0000 [IU] | Freq: Once | INTRAVENOUS | Status: AC | PRN
  Administered 2023-05-31: 500 [IU]

## 2023-05-31 MED ORDER — PALONOSETRON HCL INJECTION 0.25 MG/5ML
0.2500 mg | Freq: Once | INTRAVENOUS | Status: AC
Start: 2023-05-31 — End: 2023-05-31
  Administered 2023-05-31: 0.25 mg via INTRAVENOUS
  Filled 2023-05-31: qty 5

## 2023-05-31 MED ORDER — SODIUM CHLORIDE 0.9 % IV SOLN
INTRAVENOUS | Status: DC
Start: 2023-05-31 — End: 2023-05-31

## 2023-05-31 MED ORDER — POTASSIUM CHLORIDE IN NACL 20-0.9 MEQ/L-% IV SOLN
Freq: Once | INTRAVENOUS | Status: AC
  Filled 2023-05-31: qty 1000

## 2023-05-31 MED ORDER — SODIUM CHLORIDE 0.9 % IV SOLN
28.0000 mg/m2 | Freq: Once | INTRAVENOUS | Status: AC
  Administered 2023-05-31: 50 mg via INTRAVENOUS
  Filled 2023-05-31: qty 50

## 2023-05-31 MED ORDER — DEXAMETHASONE SODIUM PHOSPHATE 10 MG/ML IJ SOLN
10.0000 mg | Freq: Once | INTRAMUSCULAR | Status: AC
  Administered 2023-05-31: 10 mg via INTRAVENOUS
  Filled 2023-05-31: qty 1

## 2023-05-31 MED ORDER — SODIUM CHLORIDE 0.9% FLUSH
10.0000 mL | INTRAVENOUS | Status: DC | PRN
Start: 2023-05-31 — End: 2023-05-31
  Administered 2023-05-31: 10 mL

## 2023-05-31 MED ORDER — MAGNESIUM SULFATE 2 GM/50ML IV SOLN
2.0000 g | Freq: Once | INTRAVENOUS | Status: AC
Start: 2023-05-31 — End: 2023-05-31
  Administered 2023-05-31: 2 g via INTRAVENOUS
  Filled 2023-05-31: qty 50

## 2023-05-31 NOTE — Progress Notes (Signed)
Patient output at 100 ml. Pre-hydration continued. Per Jae Dire, [place] okay to proceed with premedications. Will re-assess output following completion of pre-hydration fluids.

## 2023-05-31 NOTE — Patient Instructions (Signed)
 [facility]  Discharge Instructions: Thank you for choosing [facility] to provide your oncology and hematology care.   If you have a lab appointment with the Cancer Center, please go directly to the Cancer Center and check in at the registration area.   Wear comfortable clothing and clothing appropriate for easy access to any Portacath or PICC line.   We strive to give you quality time with your provider. You may need to reschedule your appointment if you arrive late (15 or more minutes).  Arriving late affects you and other patients whose appointments are after yours.  Also, if you miss three or more appointments without notifying the office, you may be dismissed from the clinic at the provider's discretion.      For prescription refill requests, have your pharmacy contact our office and allow 72 hours for refills to be completed.    Today you received the following chemotherapy and/or immunotherapy agents: Cisplatin.       To help prevent nausea and vomiting after your treatment, we encourage you to take your nausea medication as directed.  BELOW ARE SYMPTOMS THAT SHOULD BE REPORTED IMMEDIATELY: *FEVER GREATER THAN 100.4 F (38 C) OR HIGHER *CHILLS OR SWEATING *NAUSEA AND VOMITING THAT IS NOT CONTROLLED WITH YOUR NAUSEA MEDICATION *UNUSUAL SHORTNESS OF BREATH *UNUSUAL BRUISING OR BLEEDING *URINARY PROBLEMS (pain or burning when urinating, or frequent urination) *BOWEL PROBLEMS (unusual diarrhea, constipation, pain near the anus) TENDERNESS IN MOUTH AND THROAT WITH OR WITHOUT PRESENCE OF ULCERS (sore throat, sores in mouth, or a toothache) UNUSUAL RASH, SWELLING OR PAIN  UNUSUAL VAGINAL DISCHARGE OR ITCHING   Items with * indicate a potential emergency and should be followed up as soon as possible or go to the Emergency Department if any problems should occur.  Please show the CHEMOTHERAPY ALERT CARD or  IMMUNOTHERAPY ALERT CARD at check-in to the Emergency Department and triage nurse.  Should you have questions after your visit or need to cancel or reschedule your appointment, please contact [facility]  Dept: [phone]  and follow the prompts.  Office hours are 8:00 a.m. to 4:30 p.m. Monday - Friday. Please note that voicemails left after 4:00 p.m. may not be returned until the following business day.  We are closed weekends and major holidays. You have access to a nurse at all times for urgent questions. Please call the main number to the clinic Dept: [phone] and follow the prompts.   For any non-urgent questions, you may also contact your provider using MyChart. We now offer e-Visits for anyone 11 and older to request care online for non-urgent symptoms. For details visit [email or web address].   Also download the MyChart app! Go to the app store, search "MyChart", open the app, select [place], and log in with your MyChart username and password.

## 2023-06-01 ENCOUNTER — Ambulatory Visit
Admission: RE | Admit: 2023-06-01 | Discharge: 2023-06-01 | Disposition: A | Discharge status: Home / Self Care | Payer: Medicare Other | Source: Ambulatory Visit | Attending: Radiation Oncology | Admitting: Radiation Oncology

## 2023-06-01 ENCOUNTER — Other Ambulatory Visit: Payer: Self-pay

## 2023-06-01 DIAGNOSIS — N183 Chronic kidney disease, stage 3 unspecified: Secondary | ICD-10-CM | POA: Diagnosis not present

## 2023-06-01 DIAGNOSIS — Z5111 Encounter for antineoplastic chemotherapy: Secondary | ICD-10-CM | POA: Diagnosis not present

## 2023-06-01 DIAGNOSIS — R296 Repeated falls: Secondary | ICD-10-CM | POA: Diagnosis not present

## 2023-06-01 DIAGNOSIS — I129 Hypertensive chronic kidney disease with stage 1 through stage 4 chronic kidney disease, or unspecified chronic kidney disease: Secondary | ICD-10-CM | POA: Diagnosis not present

## 2023-06-01 DIAGNOSIS — Z510 Encounter for antineoplastic radiation therapy: Secondary | ICD-10-CM | POA: Diagnosis not present

## 2023-06-01 DIAGNOSIS — Z23 Encounter for immunization: Secondary | ICD-10-CM | POA: Diagnosis not present

## 2023-06-01 LAB — RAD ONC ARIA SESSION SUMMARY
Course Elapsed Days: 22
Plan Fractions Treated to Date: 16
Plan Prescribed Dose Per Fraction: 1.8 Gy
Plan Total Fractions Prescribed: 25
Plan Total Prescribed Dose: 45 Gy
Reference Point Dosage Given to Date: 28.8 Gy
Reference Point Session Dosage Given: 1.8 Gy
Session Number: 16

## 2023-06-04 ENCOUNTER — Ambulatory Visit
Admission: RE | Admit: 2023-06-04 | Discharge: 2023-06-04 | Disposition: A | Discharge status: Home / Self Care | Payer: Medicare Other | Source: Ambulatory Visit | Attending: Radiation Oncology | Admitting: Radiation Oncology

## 2023-06-04 ENCOUNTER — Inpatient Hospital Stay: Payer: Medicare Other

## 2023-06-04 ENCOUNTER — Other Ambulatory Visit: Payer: Self-pay

## 2023-06-04 DIAGNOSIS — Z23 Encounter for immunization: Secondary | ICD-10-CM | POA: Diagnosis not present

## 2023-06-04 DIAGNOSIS — Z510 Encounter for antineoplastic radiation therapy: Secondary | ICD-10-CM | POA: Diagnosis not present

## 2023-06-04 DIAGNOSIS — C538 Malignant neoplasm of overlapping sites of cervix uteri: Secondary | ICD-10-CM

## 2023-06-04 DIAGNOSIS — Z5111 Encounter for antineoplastic chemotherapy: Secondary | ICD-10-CM | POA: Diagnosis not present

## 2023-06-04 DIAGNOSIS — N183 Chronic kidney disease, stage 3 unspecified: Secondary | ICD-10-CM | POA: Diagnosis not present

## 2023-06-04 DIAGNOSIS — I129 Hypertensive chronic kidney disease with stage 1 through stage 4 chronic kidney disease, or unspecified chronic kidney disease: Secondary | ICD-10-CM | POA: Diagnosis not present

## 2023-06-04 DIAGNOSIS — R296 Repeated falls: Secondary | ICD-10-CM | POA: Diagnosis not present

## 2023-06-04 DIAGNOSIS — C539 Malignant neoplasm of cervix uteri, unspecified: Secondary | ICD-10-CM

## 2023-06-04 LAB — CBC WITH DIFFERENTIAL (CANCER CENTER ONLY)
Abs Immature Granulocytes: 0.01 10*3/uL (ref 0.00–0.07)
Basophils Absolute: 0 10*3/uL (ref 0.0–0.1)
Basophils Relative: 1 %
Eosinophils Absolute: 0.5 10*3/uL (ref 0.0–0.5)
Eosinophils Relative: 10 %
HCT: 32.2 % — ABNORMAL LOW (ref 36.0–46.0)
Hemoglobin: 11.4 g/dL — ABNORMAL LOW (ref 12.0–15.0)
Immature Granulocytes: 0 %
Lymphocytes Relative: 4 %
Lymphs Abs: 0.2 10*3/uL — ABNORMAL LOW (ref 0.7–4.0)
MCH: 35.4 pg — ABNORMAL HIGH (ref 26.0–34.0)
MCHC: 35.4 g/dL (ref 30.0–36.0)
MCV: 100 fL (ref 80.0–100.0)
Monocytes Absolute: 0.5 10*3/uL (ref 0.1–1.0)
Monocytes Relative: 9 %
Neutro Abs: 3.8 10*3/uL (ref 1.7–7.7)
Neutrophils Relative %: 76 %
Platelet Count: 153 10*3/uL (ref 150–400)
RBC: 3.22 MIL/uL — ABNORMAL LOW (ref 3.87–5.11)
RDW: 12.4 % (ref 11.5–15.5)
WBC Count: 5 10*3/uL (ref 4.0–10.5)
nRBC: 0 % (ref 0.0–0.2)

## 2023-06-04 LAB — RAD ONC ARIA SESSION SUMMARY
Course Elapsed Days: 25
Plan Fractions Treated to Date: 17
Plan Prescribed Dose Per Fraction: 1.8 Gy
Plan Total Fractions Prescribed: 25
Plan Total Prescribed Dose: 45 Gy
Reference Point Dosage Given to Date: 30.6 Gy
Reference Point Session Dosage Given: 1.8 Gy
Session Number: 17

## 2023-06-04 LAB — BASIC METABOLIC PANEL - CANCER CENTER ONLY
Anion gap: 6 (ref 5–15)
BUN: 21 mg/dL (ref 8–23)
CO2: 25 mmol/L (ref 22–32)
Calcium: 9.3 mg/dL (ref 8.9–10.3)
Chloride: 107 mmol/L (ref 98–111)
Creatinine: 1.28 mg/dL — ABNORMAL HIGH (ref 0.44–1.00)
GFR, Estimated: 43 mL/min — ABNORMAL LOW (ref >60)
Glucose, Bld: 117 mg/dL — ABNORMAL HIGH (ref 70–99)
Potassium: 4.6 mmol/L (ref 3.5–5.1)
Sodium: 138 mmol/L (ref 135–145)

## 2023-06-04 MED ORDER — HEPARIN SOD (PORK) LOCK FLUSH 100 UNIT/ML IV SOLN
500.0000 [IU] | Freq: Once | INTRAVENOUS | Status: AC
  Administered 2023-06-04: 500 [IU]

## 2023-06-04 MED ORDER — SODIUM CHLORIDE 0.9% FLUSH
10.0000 mL | Freq: Once | INTRAVENOUS | Status: AC
  Administered 2023-06-04: 10 mL

## 2023-06-05 ENCOUNTER — Ambulatory Visit: Payer: Medicare Other

## 2023-06-05 ENCOUNTER — Ambulatory Visit
Admission: RE | Admit: 2023-06-05 | Discharge: 2023-06-05 | Disposition: A | Discharge status: Home / Self Care | Payer: Medicare Other | Source: Ambulatory Visit | Attending: Radiation Oncology | Admitting: Radiation Oncology

## 2023-06-05 ENCOUNTER — Other Ambulatory Visit: Payer: Self-pay

## 2023-06-05 DIAGNOSIS — R296 Repeated falls: Secondary | ICD-10-CM | POA: Diagnosis not present

## 2023-06-05 DIAGNOSIS — Z23 Encounter for immunization: Secondary | ICD-10-CM | POA: Diagnosis not present

## 2023-06-05 DIAGNOSIS — C538 Malignant neoplasm of overlapping sites of cervix uteri: Secondary | ICD-10-CM

## 2023-06-05 DIAGNOSIS — N183 Chronic kidney disease, stage 3 unspecified: Secondary | ICD-10-CM | POA: Diagnosis not present

## 2023-06-05 DIAGNOSIS — I129 Hypertensive chronic kidney disease with stage 1 through stage 4 chronic kidney disease, or unspecified chronic kidney disease: Secondary | ICD-10-CM | POA: Diagnosis not present

## 2023-06-05 DIAGNOSIS — Z510 Encounter for antineoplastic radiation therapy: Secondary | ICD-10-CM | POA: Diagnosis not present

## 2023-06-05 DIAGNOSIS — Z5111 Encounter for antineoplastic chemotherapy: Secondary | ICD-10-CM | POA: Diagnosis not present

## 2023-06-05 LAB — RAD ONC ARIA SESSION SUMMARY
Course Elapsed Days: 26
Plan Fractions Treated to Date: 18
Plan Prescribed Dose Per Fraction: 1.8 Gy
Plan Total Fractions Prescribed: 25
Plan Total Prescribed Dose: 45 Gy
Reference Point Dosage Given to Date: 32.4 Gy
Reference Point Session Dosage Given: 1.8 Gy
Session Number: 18

## 2023-06-05 MED ORDER — DIPHENOXYLATE-ATROPINE 2.5-0.025 MG PO TABS
1.0000 | ORAL_TABLET | Freq: Four times a day (QID) | ORAL | 0 refills | Status: DC | PRN
Start: 2023-06-05 — End: 2023-06-07

## 2023-06-05 MED FILL — Fosaprepitant Dimeglumine For IV Infusion 150 MG (Base Eq): INTRAVENOUS | Qty: 5 | Status: AC

## 2023-06-07 ENCOUNTER — Other Ambulatory Visit: Payer: Self-pay

## 2023-06-07 ENCOUNTER — Ambulatory Visit
Admission: RE | Admit: 2023-06-07 | Discharge: 2023-06-07 | Disposition: A | Discharge status: Home / Self Care | Payer: Medicare Other | Source: Ambulatory Visit | Attending: Radiation Oncology | Admitting: Radiation Oncology

## 2023-06-07 ENCOUNTER — Encounter: Payer: Self-pay | Admitting: Hematology and Oncology

## 2023-06-07 ENCOUNTER — Other Ambulatory Visit (HOSPITAL_COMMUNITY): Payer: Self-pay

## 2023-06-07 ENCOUNTER — Other Ambulatory Visit: Payer: Medicare Other

## 2023-06-07 ENCOUNTER — Inpatient Hospital Stay: Payer: Medicare Other | Admitting: Hematology and Oncology

## 2023-06-07 ENCOUNTER — Inpatient Hospital Stay: Payer: Medicare Other | Attending: Psychiatry

## 2023-06-07 VITALS — BP 134/63 | HR 70 | Temp 98.2°F | Resp 18 | Wt 160.0 lb

## 2023-06-07 DIAGNOSIS — R197 Diarrhea, unspecified: Secondary | ICD-10-CM | POA: Insufficient documentation

## 2023-06-07 DIAGNOSIS — Z510 Encounter for antineoplastic radiation therapy: Secondary | ICD-10-CM | POA: Insufficient documentation

## 2023-06-07 DIAGNOSIS — N1832 Chronic kidney disease, stage 3b: Secondary | ICD-10-CM | POA: Diagnosis not present

## 2023-06-07 DIAGNOSIS — Z79899 Other long term (current) drug therapy: Secondary | ICD-10-CM | POA: Diagnosis not present

## 2023-06-07 DIAGNOSIS — Z7982 Long term (current) use of aspirin: Secondary | ICD-10-CM | POA: Diagnosis not present

## 2023-06-07 DIAGNOSIS — C530 Malignant neoplasm of endocervix: Secondary | ICD-10-CM | POA: Insufficient documentation

## 2023-06-07 DIAGNOSIS — Z5111 Encounter for antineoplastic chemotherapy: Secondary | ICD-10-CM | POA: Diagnosis not present

## 2023-06-07 DIAGNOSIS — C539 Malignant neoplasm of cervix uteri, unspecified: Secondary | ICD-10-CM

## 2023-06-07 DIAGNOSIS — C538 Malignant neoplasm of overlapping sites of cervix uteri: Secondary | ICD-10-CM

## 2023-06-07 DIAGNOSIS — N183 Chronic kidney disease, stage 3 unspecified: Secondary | ICD-10-CM | POA: Insufficient documentation

## 2023-06-07 DIAGNOSIS — D61818 Other pancytopenia: Secondary | ICD-10-CM | POA: Insufficient documentation

## 2023-06-07 DIAGNOSIS — R110 Nausea: Secondary | ICD-10-CM

## 2023-06-07 DIAGNOSIS — Z87891 Personal history of nicotine dependence: Secondary | ICD-10-CM | POA: Insufficient documentation

## 2023-06-07 LAB — RAD ONC ARIA SESSION SUMMARY
Course Elapsed Days: 28
Plan Fractions Treated to Date: 19
Plan Prescribed Dose Per Fraction: 1.8 Gy
Plan Total Fractions Prescribed: 25
Plan Total Prescribed Dose: 45 Gy
Reference Point Dosage Given to Date: 34.2 Gy
Reference Point Session Dosage Given: 1.8 Gy
Session Number: 19

## 2023-06-07 MED ORDER — MAGNESIUM SULFATE 2 GM/50ML IV SOLN
2.0000 g | Freq: Once | INTRAVENOUS | Status: AC
Start: 2023-06-07 — End: 2023-06-07
  Administered 2023-06-07: 2 g via INTRAVENOUS
  Filled 2023-06-07: qty 50

## 2023-06-07 MED ORDER — SODIUM CHLORIDE 0.9 % IV SOLN: INTRAVENOUS | Status: DC

## 2023-06-07 MED ORDER — DEXAMETHASONE SODIUM PHOSPHATE 10 MG/ML IJ SOLN
10.0000 mg | Freq: Once | INTRAMUSCULAR | Status: AC
  Administered 2023-06-07: 10 mg via INTRAVENOUS
  Filled 2023-06-07: qty 1

## 2023-06-07 MED ORDER — HEPARIN SOD (PORK) LOCK FLUSH 100 UNIT/ML IV SOLN
500.0000 [IU] | Freq: Once | INTRAVENOUS | Status: AC | PRN
  Administered 2023-06-07: 500 [IU]

## 2023-06-07 MED ORDER — PALONOSETRON HCL INJECTION 0.25 MG/5ML
0.2500 mg | Freq: Once | INTRAVENOUS | Status: AC
  Administered 2023-06-07: 0.25 mg via INTRAVENOUS
  Filled 2023-06-07: qty 5

## 2023-06-07 MED ORDER — SODIUM CHLORIDE 0.9% FLUSH
10.0000 mL | INTRAVENOUS | Status: DC | PRN
  Administered 2023-06-07: 10 mL

## 2023-06-07 MED ORDER — SODIUM CHLORIDE 0.9 % IV SOLN
28.0000 mg/m2 | Freq: Once | INTRAVENOUS | Status: AC
  Administered 2023-06-07: 50 mg via INTRAVENOUS
  Filled 2023-06-07: qty 50

## 2023-06-07 MED ORDER — DIPHENOXYLATE-ATROPINE 2.5-0.025 MG PO TABS
1.0000 | ORAL_TABLET | Freq: Four times a day (QID) | ORAL | 0 refills | Status: DC | PRN
  Filled 2023-06-07: qty 60, 15d supply, fill #0

## 2023-06-07 MED ORDER — POTASSIUM CHLORIDE IN NACL 20-0.9 MEQ/L-% IV SOLN
Freq: Once | INTRAVENOUS | Status: AC
  Filled 2023-06-07: qty 1000

## 2023-06-07 MED ORDER — SODIUM CHLORIDE 0.9 % IV SOLN
150.0000 mg | Freq: Once | INTRAVENOUS | Status: AC
  Administered 2023-06-07: 150 mg via INTRAVENOUS
  Filled 2023-06-07: qty 150

## 2023-06-07 NOTE — Progress Notes (Signed)
 [facility] OFFICE PROGRESS NOTE  Patient Care Team: Paula Lynwood ORN, MD as PCP - General (Internal Medicine) Paula Sharper, MD as Consulting Physician (Ophthalmology) Paula Vera GAILS, MD as Consulting Physician (Gynecology)  ASSESSMENT & PLAN:  Malignant neoplasm of cervix [facility]) She had minor side effects including some nausea and diarrhea Her blood counts and renal function are stable We discussed test results We discussed importance of adequate hydration I will see her weekly for further follow-up  CKD (chronic kidney disease) stage 3, GFR 30-59 ml/min (HCC) She will continue on mildly reduced dose treatment Overall, her renal function is stable We discussed importance of adequate fluid hydration  Nausea without vomiting To be expected side effects of treatment She will continue antiemetics as needed  Diarrhea She is prescribed Lomotil   No orders of the defined types were placed in this encounter.   All questions were answered. The patient knows to call the clinic with any problems, questions or concerns. The total time spent in the appointment was 25 minutes encounter with patients including review of chart and various tests results, discussions about plan of care and coordination of care plan   Paula Bedford, MD 06/07/2023 3:45 PM  INTERVAL HISTORY: Please see below for problem oriented charting. she returns for treatment follow-up.  She is seen in the infusion room.  She denies worsening neuropathy or hearing problems Her nausea is stable She had intermittent diarrhea  REVIEW OF SYSTEMS:   Constitutional: Denies fevers, chills or abnormal weight loss Eyes: Denies blurriness of vision Ears, nose, mouth, throat, and face: Denies mucositis or sore throat Respiratory: Denies cough, dyspnea or wheezes Cardiovascular: Denies palpitation, chest discomfort or lower extremity swelling Skin: Denies abnormal skin rashes Lymphatics: Denies new lymphadenopathy or  easy bruising Behavioral/Psych: Mood is stable, no new changes  All other systems were reviewed with the patient and are negative.  I have reviewed the past medical history, past surgical history, social history and family history with the patient and they are unchanged from previous note.  ALLERGIES:  is allergic to penicillins.  MEDICATIONS:  Current Outpatient Medications  Medication Sig Dispense Refill   diphenoxylate -atropine  (LOMOTIL ) 2.5-0.025 MG tablet Take 1 tablet by mouth 4 (four) times daily as needed for diarrhea or loose stools. 60 tablet 0   aspirin EC 81 MG tablet Take 81 mg by mouth daily.     Cholecalciferol (VITAMIN D3) 2000 units TABS Take by mouth.     divalproex  (DEPAKOTE  ER) 250 MG 24 hr tablet Take 3 tablets (750 mg total) by mouth every morning. 30 tablet 0   ezetimibe  (ZETIA ) 10 MG tablet TAKE 1 TABLET BY MOUTH EVERY DAY 90 tablet 3   famotidine  (PEPCID ) 20 MG tablet TAKE 1 TABLET BY MOUTH TWICE A DAY 180 tablet 3   FLUoxetine (PROZAC) 10 MG capsule Take 10 mg by mouth daily. Takes 2 one time daily  2   INGREZZA 60 MG capsule Take 60 mg by mouth daily.     lidocaine -prilocaine  (EMLA ) cream Apply 1 Application topically as needed. (Patient taking differently: Apply 1 Application topically as needed. Hasn't started) 30 g 3   linaclotide  (LINZESS ) 145 MCG CAPS capsule Take 1 capsule (145 mcg total) by mouth daily as needed. 30 capsule 11   multivitamin-iron-minerals-folic acid  (CENTRUM) chewable tablet Chew 1 tablet by mouth daily.     MYRBETRIQ  50 MG TB24 tablet TAKE 1 TABLET BY MOUTH EVERY DAY 90 tablet 3   Omega-3 Fatty Acids (FISH OIL) 1000  MG CAPS Take 2 capsules by mouth daily.     ondansetron  (ZOFRAN ) 8 MG tablet Take 1 tablet (8 mg total) by mouth every 8 (eight) hours as needed for nausea. 30 tablet 3   pravastatin  (PRAVACHOL ) 40 MG tablet TAKE 1 TABLET BY MOUTH EVERY DAY 90 tablet 3   prochlorperazine  (COMPAZINE ) 10 MG tablet Take 1 tablet (10 mg total) by  mouth every 6 (six) hours as needed for nausea or vomiting. 30 tablet 3   triamcinolone  cream (KENALOG ) 0.1 % APPLY TO AFFECTED AREA TWICE A DAY 30 g 1   trimethoprim  (TRIMPEX ) 100 MG tablet Take 1 tablet (100 mg total) by mouth daily. 90 tablet 3   zolpidem  (AMBIEN ) 5 MG tablet Take 1 tablet (5 mg total) by mouth at bedtime as needed for sleep. 30 tablet 5   No current facility-administered medications for this visit.   Facility-Administered Medications Ordered in Other Visits  Medication Dose Route Frequency Provider Last Rate Last Admin   0.9 %  sodium chloride  infusion   Intravenous Continuous Lonn Hicks, MD 10 mL/hr at 06/07/23 0949 New Bag at 06/07/23 0949   CISplatin  (PLATINOL ) 50 mg in sodium chloride  0.9 % 250 mL chemo infusion  28 mg/m2 (Treatment Plan Recorded) Intravenous Once Lonn Hicks, MD 300 mL/hr at 06/07/23 1534 Infusion Verify at 06/07/23 1534   heparin  lock flush 100 unit/mL  500 Units Intracatheter Once PRN Lonn, Marvelyn Bouchillon, MD       sodium chloride  flush (NS) 0.9 % injection 10 mL  10 mL Intracatheter PRN Lonn Hicks, MD        SUMMARY OF ONCOLOGIC HISTORY: Oncology History Overview Note  PD-L1 is negative, CPS <1%   Malignant neoplasm of cervix (HCC)  03/08/2023 Initial Diagnosis   Presented to OB/GYN for postmenopausal bleeding. Pap with HSIL, HPV 16 positive   03/26/2023 Initial Biopsy   Cervical biopsy: A. ANTERIOR RIGHT CERVICAL MASS, BIOPSY:       Invasive squamous cell carcinoma, well-differentiated,  keratinizing.    04/05/2023 Cancer Staging   Staging form: Cervix Uteri, AJCC Version 9 - Clinical stage from 04/05/2023: FIGO Stage IIIC1r, calculated as Stage IIIC1 (cT2b, cN1, cM0) - Signed by [name], MD on 04/16/2023 Histopathologic type: Squamous cell carcinoma, NOS Stage prefix: Initial diagnosis Stage confirmation method: Imaging Pelvic nodal status: Positive Pelvic nodal method of assessment: PET Para-aortic status: Negative Para-aortic  nodal method of assessment: PET Histologic grade (G): G1 Histologic grading system: 3 grade system   04/05/2023 Imaging   PET: IMPRESSION: 1. The cervical mass is hypermetabolic compatible with primary cervical carcinoma. 2. 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 3. No signs of solid organ metastasis. 4. Possible exophytic lesion off the upper pole of the left kidney measuring 2 cm. Difficult to confirm within the limitations of nondiagnostic CT and unenhanced technique. Consider further evaluation with renal ultrasound. 5. Coronary artery calcifications. 6.  Aortic Atherosclerosis (ICD10-I70.0).   04/30/2023 Procedure   Placement of a subcutaneous power-injectable port device. Catheter tip at the superior cavoatrial junction.   05/09/2023 -  Chemotherapy   Patient is on Treatment Plan : Cervical cancer Cisplatin  (35) q7d + XRT x 6 cycles       PHYSICAL EXAMINATION: ECOG PERFORMANCE STATUS: 1 - Symptomatic but completely ambulatory  There were no vitals filed for this visit. There were no vitals filed for this visit.  GENERAL:alert, no distress and comfortable  NEURO: alert & oriented x 3 with fluent speech, no focal  motor/sensory deficits  LABORATORY DATA:  I have reviewed the data as listed    Component Value Date/Time   NA 138 06/04/2023 1058   K 4.6 06/04/2023 1058   CL 107 06/04/2023 1058   CO2 25 06/04/2023 1058   GLUCOSE 117 (H) 06/04/2023 1058   BUN 21 06/04/2023 1058   CREATININE 1.28 (H) 06/04/2023 1058   CREATININE 1.44 (H) 12/11/2019 1445   CALCIUM 9.3 06/04/2023 1058   PROT 7.4 12/18/2022 1125   ALBUMIN 4.2 12/18/2022 1125   AST 23 12/18/2022 1125   ALT 21 12/18/2022 1125   ALKPHOS 64 12/18/2022 1125   BILITOT 0.4 12/18/2022 1125   GFRNONAA 43 (L) 06/04/2023 1058   GFRAA 53 (L) 07/31/2017 1106    No results found for: SPEP, UPEP  Lab Results  Component Value Date   WBC 5.0 06/04/2023    NEUTROABS 3.8 06/04/2023   HGB 11.4 (L) 06/04/2023   HCT 32.2 (L) 06/04/2023   MCV 100.0 06/04/2023   PLT 153 06/04/2023      Chemistry      Component Value Date/Time   NA 138 06/04/2023 1058   K 4.6 06/04/2023 1058   CL 107 06/04/2023 1058   CO2 25 06/04/2023 1058   BUN 21 06/04/2023 1058   CREATININE 1.28 (H) 06/04/2023 1058   CREATININE 1.44 (H) 12/11/2019 1445      Component Value Date/Time   CALCIUM 9.3 06/04/2023 1058   ALKPHOS 64 12/18/2022 1125   AST 23 12/18/2022 1125   ALT 21 12/18/2022 1125   BILITOT 0.4 12/18/2022 1125

## 2023-06-07 NOTE — Progress Notes (Signed)
 Per Dr Bertis Ruddy, okay to proceed with mag level from 05/28/2023

## 2023-06-07 NOTE — Assessment & Plan Note (Signed)
 She is prescribed Lomotil

## 2023-06-07 NOTE — Assessment & Plan Note (Signed)
 She had minor side effects including some nausea and diarrhea Her blood counts and renal function are stable We discussed test results We discussed importance of adequate hydration I will see her weekly for further follow-up

## 2023-06-07 NOTE — Assessment & Plan Note (Signed)
 To be expected side effects of treatment She will continue antiemetics as needed

## 2023-06-07 NOTE — Patient Instructions (Signed)

## 2023-06-07 NOTE — Assessment & Plan Note (Signed)
 She will continue on mildly reduced dose treatment Overall, her renal function is stable We discussed importance of adequate fluid hydration

## 2023-06-08 ENCOUNTER — Other Ambulatory Visit: Payer: Self-pay

## 2023-06-08 ENCOUNTER — Ambulatory Visit
Admission: RE | Admit: 2023-06-08 | Discharge: 2023-06-08 | Disposition: A | Discharge status: Home / Self Care | Payer: Medicare Other | Source: Ambulatory Visit | Attending: Radiation Oncology | Admitting: Radiation Oncology

## 2023-06-08 DIAGNOSIS — D61818 Other pancytopenia: Secondary | ICD-10-CM | POA: Diagnosis not present

## 2023-06-08 DIAGNOSIS — N183 Chronic kidney disease, stage 3 unspecified: Secondary | ICD-10-CM | POA: Diagnosis not present

## 2023-06-08 DIAGNOSIS — Z5111 Encounter for antineoplastic chemotherapy: Secondary | ICD-10-CM | POA: Diagnosis not present

## 2023-06-08 DIAGNOSIS — Z510 Encounter for antineoplastic radiation therapy: Secondary | ICD-10-CM | POA: Diagnosis not present

## 2023-06-08 DIAGNOSIS — C538 Malignant neoplasm of overlapping sites of cervix uteri: Secondary | ICD-10-CM

## 2023-06-08 DIAGNOSIS — Z7982 Long term (current) use of aspirin: Secondary | ICD-10-CM | POA: Diagnosis not present

## 2023-06-08 DIAGNOSIS — Z87891 Personal history of nicotine dependence: Secondary | ICD-10-CM | POA: Diagnosis not present

## 2023-06-08 DIAGNOSIS — Z79899 Other long term (current) drug therapy: Secondary | ICD-10-CM | POA: Diagnosis not present

## 2023-06-08 LAB — RAD ONC ARIA SESSION SUMMARY
Course Elapsed Days: 29
Plan Fractions Treated to Date: 1
Plan Prescribed Dose Per Fraction: 1.8 Gy
Plan Total Fractions Prescribed: 6
Plan Total Prescribed Dose: 10.8 Gy
Reference Point Dosage Given to Date: 36 Gy
Reference Point Session Dosage Given: 1.8 Gy
Session Number: 20

## 2023-06-11 ENCOUNTER — Other Ambulatory Visit: Payer: Self-pay

## 2023-06-11 ENCOUNTER — Ambulatory Visit
Admission: RE | Admit: 2023-06-11 | Discharge: 2023-06-11 | Disposition: A | Discharge status: Home / Self Care | Payer: Medicare Other | Source: Ambulatory Visit | Attending: Radiation Oncology | Admitting: Radiation Oncology

## 2023-06-11 ENCOUNTER — Inpatient Hospital Stay: Payer: Medicare Other

## 2023-06-11 DIAGNOSIS — Z7982 Long term (current) use of aspirin: Secondary | ICD-10-CM | POA: Diagnosis not present

## 2023-06-11 DIAGNOSIS — N183 Chronic kidney disease, stage 3 unspecified: Secondary | ICD-10-CM | POA: Diagnosis not present

## 2023-06-11 DIAGNOSIS — Z87891 Personal history of nicotine dependence: Secondary | ICD-10-CM | POA: Diagnosis not present

## 2023-06-11 DIAGNOSIS — D61818 Other pancytopenia: Secondary | ICD-10-CM | POA: Diagnosis not present

## 2023-06-11 DIAGNOSIS — Z510 Encounter for antineoplastic radiation therapy: Secondary | ICD-10-CM | POA: Diagnosis not present

## 2023-06-11 DIAGNOSIS — Z5111 Encounter for antineoplastic chemotherapy: Secondary | ICD-10-CM | POA: Diagnosis not present

## 2023-06-11 DIAGNOSIS — C539 Malignant neoplasm of cervix uteri, unspecified: Secondary | ICD-10-CM

## 2023-06-11 DIAGNOSIS — Z79899 Other long term (current) drug therapy: Secondary | ICD-10-CM | POA: Diagnosis not present

## 2023-06-11 DIAGNOSIS — C538 Malignant neoplasm of overlapping sites of cervix uteri: Secondary | ICD-10-CM

## 2023-06-11 LAB — CBC WITH DIFFERENTIAL (CANCER CENTER ONLY)
Abs Immature Granulocytes: 0.01 10*3/uL (ref 0.00–0.07)
Basophils Absolute: 0 10*3/uL (ref 0.0–0.1)
Basophils Relative: 1 %
Eosinophils Absolute: 0.1 10*3/uL (ref 0.0–0.5)
Eosinophils Relative: 2 %
HCT: 31.7 % — ABNORMAL LOW (ref 36.0–46.0)
Hemoglobin: 10.8 g/dL — ABNORMAL LOW (ref 12.0–15.0)
Immature Granulocytes: 0 %
Lymphocytes Relative: 5 %
Lymphs Abs: 0.2 10*3/uL — ABNORMAL LOW (ref 0.7–4.0)
MCH: 34.5 pg — ABNORMAL HIGH (ref 26.0–34.0)
MCHC: 34.1 g/dL (ref 30.0–36.0)
MCV: 101.3 fL — ABNORMAL HIGH (ref 80.0–100.0)
Monocytes Absolute: 0.5 10*3/uL (ref 0.1–1.0)
Monocytes Relative: 13 %
Neutro Abs: 3 10*3/uL (ref 1.7–7.7)
Neutrophils Relative %: 79 %
Platelet Count: 174 10*3/uL (ref 150–400)
RBC: 3.13 MIL/uL — ABNORMAL LOW (ref 3.87–5.11)
RDW: 13.2 % (ref 11.5–15.5)
WBC Count: 3.8 10*3/uL — ABNORMAL LOW (ref 4.0–10.5)
nRBC: 0 % (ref 0.0–0.2)

## 2023-06-11 LAB — RAD ONC ARIA SESSION SUMMARY
Course Elapsed Days: 32
Plan Fractions Treated to Date: 2
Plan Prescribed Dose Per Fraction: 1.8 Gy
Plan Total Fractions Prescribed: 6
Plan Total Prescribed Dose: 10.8 Gy
Reference Point Dosage Given to Date: 37.8 Gy
Reference Point Session Dosage Given: 1.8 Gy
Session Number: 21

## 2023-06-11 LAB — BASIC METABOLIC PANEL - CANCER CENTER ONLY
Anion gap: 5 (ref 5–15)
BUN: 13 mg/dL (ref 8–23)
CO2: 25 mmol/L (ref 22–32)
Calcium: 9.4 mg/dL (ref 8.9–10.3)
Chloride: 108 mmol/L (ref 98–111)
Creatinine: 1.4 mg/dL — ABNORMAL HIGH (ref 0.44–1.00)
GFR, Estimated: 39 mL/min — ABNORMAL LOW (ref >60)
Glucose, Bld: 102 mg/dL — ABNORMAL HIGH (ref 70–99)
Potassium: 4.4 mmol/L (ref 3.5–5.1)
Sodium: 138 mmol/L (ref 135–145)

## 2023-06-11 LAB — MAGNESIUM: Magnesium: 1.4 mg/dL — ABNORMAL LOW (ref 1.7–2.4)

## 2023-06-11 MED ORDER — HEPARIN SOD (PORK) LOCK FLUSH 100 UNIT/ML IV SOLN
500.0000 [IU] | Freq: Once | INTRAVENOUS | Status: AC
  Administered 2023-06-11: 500 [IU]

## 2023-06-11 MED ORDER — SODIUM CHLORIDE 0.9% FLUSH
10.0000 mL | Freq: Once | INTRAVENOUS | Status: AC
  Administered 2023-06-11: 10 mL

## 2023-06-12 ENCOUNTER — Ambulatory Visit: Payer: Medicare Other

## 2023-06-12 ENCOUNTER — Other Ambulatory Visit: Payer: Medicare Other

## 2023-06-12 ENCOUNTER — Encounter: Payer: Self-pay | Admitting: Hematology and Oncology

## 2023-06-12 ENCOUNTER — Inpatient Hospital Stay (HOSPITAL_BASED_OUTPATIENT_CLINIC_OR_DEPARTMENT_OTHER): Payer: Medicare Other | Admitting: Hematology and Oncology

## 2023-06-12 ENCOUNTER — Ambulatory Visit
Admission: RE | Admit: 2023-06-12 | Discharge: 2023-06-12 | Disposition: A | Discharge status: Home / Self Care | Payer: Medicare Other | Source: Ambulatory Visit | Attending: Radiation Oncology | Admitting: Radiation Oncology

## 2023-06-12 ENCOUNTER — Other Ambulatory Visit: Payer: Self-pay

## 2023-06-12 VITALS — BP 136/69 | HR 74 | Temp 98.9°F | Resp 18 | Ht 63.5 in | Wt 160.4 lb

## 2023-06-12 DIAGNOSIS — R197 Diarrhea, unspecified: Secondary | ICD-10-CM | POA: Diagnosis not present

## 2023-06-12 DIAGNOSIS — Z510 Encounter for antineoplastic radiation therapy: Secondary | ICD-10-CM | POA: Diagnosis not present

## 2023-06-12 DIAGNOSIS — C538 Malignant neoplasm of overlapping sites of cervix uteri: Secondary | ICD-10-CM

## 2023-06-12 DIAGNOSIS — N183 Chronic kidney disease, stage 3 unspecified: Secondary | ICD-10-CM | POA: Diagnosis not present

## 2023-06-12 DIAGNOSIS — Z5111 Encounter for antineoplastic chemotherapy: Secondary | ICD-10-CM | POA: Diagnosis not present

## 2023-06-12 DIAGNOSIS — D61818 Other pancytopenia: Secondary | ICD-10-CM | POA: Insufficient documentation

## 2023-06-12 DIAGNOSIS — Z79899 Other long term (current) drug therapy: Secondary | ICD-10-CM | POA: Diagnosis not present

## 2023-06-12 DIAGNOSIS — Z87891 Personal history of nicotine dependence: Secondary | ICD-10-CM | POA: Diagnosis not present

## 2023-06-12 DIAGNOSIS — Z7982 Long term (current) use of aspirin: Secondary | ICD-10-CM | POA: Diagnosis not present

## 2023-06-12 LAB — RAD ONC ARIA SESSION SUMMARY
Course Elapsed Days: 33
Plan Fractions Treated to Date: 3
Plan Prescribed Dose Per Fraction: 1.8 Gy
Plan Total Fractions Prescribed: 6
Plan Total Prescribed Dose: 10.8 Gy
Reference Point Dosage Given to Date: 39.6 Gy
Reference Point Session Dosage Given: 1.8 Gy
Session Number: 22

## 2023-06-12 NOTE — Assessment & Plan Note (Signed)
 She will continue scheduled Imodium alternate with Lomotil to control her diarrhea

## 2023-06-12 NOTE — Assessment & Plan Note (Signed)
 Due to side effects of treatment She is not symptomatic Proceed with treatment without delay

## 2023-06-12 NOTE — Assessment & Plan Note (Signed)
 She is experiencing worsening pancytopenia, diarrhea, hypomagnesemia and mild hearing loss We discussed risk and benefits of pursuing last dose of treatment this week and she agreed to proceed I plan to request pharmacist to increase IV magnesium  on her hydration fluid I will see her again next week for further follow-up

## 2023-06-12 NOTE — Progress Notes (Signed)
 [facility] OFFICE PROGRESS NOTE  Patient Care Team: [name] ORN, MD as PCP - General (Internal Medicine) Patrcia Sharper, MD as Consulting Physician (Ophthalmology) Dallie Vera GAILS, MD as Consulting Physician (Gynecology)  ASSESSMENT & PLAN:  Malignant neoplasm of cervix [facility]) She is experiencing worsening pancytopenia, diarrhea, hypomagnesemia and mild hearing loss We discussed risk and benefits of pursuing last dose of treatment this week and she agreed to proceed I plan to request pharmacist to increase IV magnesium  on her hydration fluid I will see her again next week for further follow-up  Pancytopenia, acquired (HCC) Due to side effects of treatment She is not symptomatic Proceed with treatment without delay  Hypomagnesemia I will request pharmacist to increase premed IV magnesium   Diarrhea She will continue scheduled Imodium alternate with Lomotil  to control her diarrhea  Orders Placed This Encounter  Procedures   Comprehensive metabolic panel    Standing Status:   Standing    Number of Occurrences:   33    Expiration Date:   06/11/2024   Magnesium     Standing Status:   Future    Expiration Date:   06/11/2024   CBC with Differential/Platelet    Standing Status:   Standing    Number of Occurrences:   22    Expiration Date:   06/11/2024    All questions were answered. The patient knows to call the clinic with any problems, questions or concerns. The total time spent in the appointment was 40 minutes encounter with patients including review of chart and various tests results, discussions about plan of care and coordination of care plan   Almarie Bedford, MD 06/12/2023 11:24 AM  INTERVAL HISTORY: Please see below for problem oriented charting. she returns for surveillance follow-up seen prior to the last dose of chemotherapy She is here accompanied by her son She had slight worsening hearing loss but not severe No peripheral neuropathy She continues to  have diarrhea 5-6 times and she is taking Lomotil  with Imodium as needed Denies nausea We reviewed test results and discussed risk and benefits of pursuing final treatment this week  REVIEW OF SYSTEMS:   Constitutional: Denies fevers, chills or abnormal weight loss Eyes: Denies blurriness of vision Ears, nose, mouth, throat, and face: Denies mucositis or sore throat Respiratory: Denies cough, dyspnea or wheezes Cardiovascular: Denies palpitation, chest discomfort or lower extremity swelling Skin: Denies abnormal skin rashes Lymphatics: Denies new lymphadenopathy or easy bruising Behavioral/Psych: Mood is stable, no new changes  All other systems were reviewed with the patient and are negative.  I have reviewed the past medical history, past surgical history, social history and family history with the patient and they are unchanged from previous note.  ALLERGIES:  is allergic to penicillins.  MEDICATIONS:  Current Outpatient Medications  Medication Sig Dispense Refill   aspirin EC 81 MG tablet Take 81 mg by mouth daily.     Cholecalciferol (VITAMIN D3) 2000 units TABS Take by mouth.     diphenoxylate -atropine  (LOMOTIL ) 2.5-0.025 MG tablet Take 1 tablet by mouth 4 (four) times daily as needed for diarrhea or loose stools. 60 tablet 0   divalproex  (DEPAKOTE  ER) 250 MG 24 hr tablet Take 3 tablets (750 mg total) by mouth every morning. 30 tablet 0   ezetimibe  (ZETIA ) 10 MG tablet TAKE 1 TABLET BY MOUTH EVERY DAY 90 tablet 3   famotidine  (PEPCID ) 20 MG tablet TAKE 1 TABLET BY MOUTH TWICE A DAY 180 tablet 3   FLUoxetine (PROZAC) 10  MG capsule Take 10 mg by mouth daily. Takes 2 one time daily  2   INGREZZA 60 MG capsule Take 60 mg by mouth daily.     lidocaine -prilocaine  (EMLA ) cream Apply 1 Application topically as needed. (Patient taking differently: Apply 1 Application topically as needed. Hasn't started) 30 g 3   linaclotide  (LINZESS ) 145 MCG CAPS capsule Take 1 capsule (145 mcg total) by  mouth daily as needed. 30 capsule 11   multivitamin-iron-minerals-folic acid  (CENTRUM) chewable tablet Chew 1 tablet by mouth daily.     MYRBETRIQ  50 MG TB24 tablet TAKE 1 TABLET BY MOUTH EVERY DAY 90 tablet 3   Omega-3 Fatty Acids (FISH OIL) 1000 MG CAPS Take 2 capsules by mouth daily.     ondansetron  (ZOFRAN ) 8 MG tablet Take 1 tablet (8 mg total) by mouth every 8 (eight) hours as needed for nausea. 30 tablet 3   pravastatin  (PRAVACHOL ) 40 MG tablet TAKE 1 TABLET BY MOUTH EVERY DAY 90 tablet 3   prochlorperazine  (COMPAZINE ) 10 MG tablet Take 1 tablet (10 mg total) by mouth every 6 (six) hours as needed for nausea or vomiting. 30 tablet 3   triamcinolone  cream (KENALOG ) 0.1 % APPLY TO AFFECTED AREA TWICE A DAY 30 g 1   trimethoprim  (TRIMPEX ) 100 MG tablet Take 1 tablet (100 mg total) by mouth daily. 90 tablet 3   zolpidem  (AMBIEN ) 5 MG tablet Take 1 tablet (5 mg total) by mouth at bedtime as needed for sleep. 30 tablet 5   No current facility-administered medications for this visit.    SUMMARY OF ONCOLOGIC HISTORY: Oncology History Overview Note  PD-L1 is negative, CPS <1%   Malignant neoplasm of cervix (HCC)  03/08/2023 Initial Diagnosis   Presented to OB/GYN for postmenopausal bleeding. Pap with HSIL, HPV 16 positive   03/26/2023 Initial Biopsy   Cervical biopsy: A. ANTERIOR RIGHT CERVICAL MASS, BIOPSY:       Invasive squamous cell carcinoma, well-differentiated,  keratinizing.    04/05/2023 Cancer Staging   Staging form: Cervix Uteri, AJCC Version 9 - Clinical stage from 04/05/2023: FIGO Stage IIIC1r, calculated as Stage IIIC1 (cT2b, cN1, cM0) - Signed by [name], MD on 04/16/2023 Histopathologic type: Squamous cell carcinoma, NOS Stage prefix: Initial diagnosis Stage confirmation method: Imaging Pelvic nodal status: Positive Pelvic nodal method of assessment: PET Para-aortic status: Negative Para-aortic nodal method of assessment: PET Histologic grade (G):  G1 Histologic grading system: 3 grade system   04/05/2023 Imaging   PET: IMPRESSION: 1. The cervical mass is hypermetabolic compatible with primary cervical carcinoma. 2. 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 3. No signs of solid organ metastasis. 4. Possible exophytic lesion off the upper pole of the left kidney measuring 2 cm. Difficult to confirm within the limitations of nondiagnostic CT and unenhanced technique. Consider further evaluation with renal ultrasound. 5. Coronary artery calcifications. 6.  Aortic Atherosclerosis (ICD10-I70.0).   04/30/2023 Procedure   Placement of a subcutaneous power-injectable port device. Catheter tip at the superior cavoatrial junction.   05/09/2023 -  Chemotherapy   Patient is on Treatment Plan : Cervical cancer Cisplatin  (35) q7d + XRT x 6 cycles       PHYSICAL EXAMINATION: ECOG PERFORMANCE STATUS: 1 - Symptomatic but completely ambulatory  Vitals:   06/12/23 1113  BP: 136/69  Pulse: 74  Resp: 18  Temp: 98.9 F (37.2 C)  SpO2: 96%   Filed Weights   06/12/23 1113  Weight: 160 lb 6.4 oz (72.8 kg)  GENERAL:alert, no distress and comfortable   LABORATORY DATA:  I have reviewed the data as listed    Component Value Date/Time   NA 138 06/11/2023 1108   K 4.4 06/11/2023 1108   CL 108 06/11/2023 1108   CO2 25 06/11/2023 1108   GLUCOSE 102 (H) 06/11/2023 1108   BUN 13 06/11/2023 1108   CREATININE 1.40 (H) 06/11/2023 1108   CREATININE 1.44 (H) 12/11/2019 1445   CALCIUM 9.4 06/11/2023 1108   PROT 7.4 12/18/2022 1125   ALBUMIN 4.2 12/18/2022 1125   AST 23 12/18/2022 1125   ALT 21 12/18/2022 1125   ALKPHOS 64 12/18/2022 1125   BILITOT 0.4 12/18/2022 1125   GFRNONAA 39 (L) 06/11/2023 1108   GFRAA 53 (L) 07/31/2017 1106    No results found for: SPEP, UPEP  Lab Results  Component Value Date   WBC 3.8 (L) 06/11/2023   NEUTROABS 3.0 06/11/2023   HGB 10.8 (L) 06/11/2023    HCT 31.7 (L) 06/11/2023   MCV 101.3 (H) 06/11/2023   PLT 174 06/11/2023      Chemistry      Component Value Date/Time   NA 138 06/11/2023 1108   K 4.4 06/11/2023 1108   CL 108 06/11/2023 1108   CO2 25 06/11/2023 1108   BUN 13 06/11/2023 1108   CREATININE 1.40 (H) 06/11/2023 1108   CREATININE 1.44 (H) 12/11/2019 1445      Component Value Date/Time   CALCIUM 9.4 06/11/2023 1108   ALKPHOS 64 12/18/2022 1125   AST 23 12/18/2022 1125   ALT 21 12/18/2022 1125   BILITOT 0.4 12/18/2022 1125

## 2023-06-12 NOTE — Assessment & Plan Note (Signed)
 I will request pharmacist to increase premed IV magnesium

## 2023-06-13 ENCOUNTER — Other Ambulatory Visit: Payer: Self-pay

## 2023-06-13 ENCOUNTER — Ambulatory Visit
Admission: RE | Admit: 2023-06-13 | Discharge: 2023-06-13 | Disposition: A | Discharge status: Home / Self Care | Payer: Medicare Other | Source: Ambulatory Visit | Attending: Radiation Oncology | Admitting: Radiation Oncology

## 2023-06-13 ENCOUNTER — Other Ambulatory Visit: Payer: Self-pay | Admitting: Hematology and Oncology

## 2023-06-13 DIAGNOSIS — Z87891 Personal history of nicotine dependence: Secondary | ICD-10-CM | POA: Diagnosis not present

## 2023-06-13 DIAGNOSIS — D61818 Other pancytopenia: Secondary | ICD-10-CM | POA: Diagnosis not present

## 2023-06-13 DIAGNOSIS — Z5111 Encounter for antineoplastic chemotherapy: Secondary | ICD-10-CM | POA: Diagnosis not present

## 2023-06-13 DIAGNOSIS — Z510 Encounter for antineoplastic radiation therapy: Secondary | ICD-10-CM | POA: Diagnosis not present

## 2023-06-13 DIAGNOSIS — Z7982 Long term (current) use of aspirin: Secondary | ICD-10-CM | POA: Diagnosis not present

## 2023-06-13 DIAGNOSIS — Z79899 Other long term (current) drug therapy: Secondary | ICD-10-CM | POA: Diagnosis not present

## 2023-06-13 DIAGNOSIS — N183 Chronic kidney disease, stage 3 unspecified: Secondary | ICD-10-CM | POA: Diagnosis not present

## 2023-06-13 LAB — RAD ONC ARIA SESSION SUMMARY
Course Elapsed Days: 34
Plan Fractions Treated to Date: 4
Plan Prescribed Dose Per Fraction: 1.8 Gy
Plan Total Fractions Prescribed: 6
Plan Total Prescribed Dose: 10.8 Gy
Reference Point Dosage Given to Date: 41.4 Gy
Reference Point Session Dosage Given: 1.8 Gy
Session Number: 23

## 2023-06-13 MED FILL — Fosaprepitant Dimeglumine For IV Infusion 150 MG (Base Eq): INTRAVENOUS | Qty: 5 | Status: AC

## 2023-06-14 ENCOUNTER — Other Ambulatory Visit (HOSPITAL_COMMUNITY): Payer: Self-pay | Admitting: Radiation Oncology

## 2023-06-14 ENCOUNTER — Other Ambulatory Visit: Payer: Self-pay

## 2023-06-14 ENCOUNTER — Ambulatory Visit
Admission: RE | Admit: 2023-06-14 | Discharge: 2023-06-14 | Disposition: A | Discharge status: Home / Self Care | Payer: Medicare Other | Source: Ambulatory Visit | Attending: Radiation Oncology | Admitting: Radiation Oncology

## 2023-06-14 ENCOUNTER — Inpatient Hospital Stay: Payer: Medicare Other

## 2023-06-14 VITALS — BP 131/68 | HR 75 | Temp 98.5°F | Resp 16

## 2023-06-14 DIAGNOSIS — Z510 Encounter for antineoplastic radiation therapy: Secondary | ICD-10-CM | POA: Diagnosis not present

## 2023-06-14 DIAGNOSIS — Z8541 Personal history of malignant neoplasm of cervix uteri: Secondary | ICD-10-CM

## 2023-06-14 DIAGNOSIS — Z79899 Other long term (current) drug therapy: Secondary | ICD-10-CM | POA: Diagnosis not present

## 2023-06-14 DIAGNOSIS — C539 Malignant neoplasm of cervix uteri, unspecified: Secondary | ICD-10-CM

## 2023-06-14 DIAGNOSIS — D61818 Other pancytopenia: Secondary | ICD-10-CM | POA: Diagnosis not present

## 2023-06-14 DIAGNOSIS — Z87891 Personal history of nicotine dependence: Secondary | ICD-10-CM | POA: Diagnosis not present

## 2023-06-14 DIAGNOSIS — Z7982 Long term (current) use of aspirin: Secondary | ICD-10-CM | POA: Diagnosis not present

## 2023-06-14 DIAGNOSIS — N183 Chronic kidney disease, stage 3 unspecified: Secondary | ICD-10-CM | POA: Diagnosis not present

## 2023-06-14 DIAGNOSIS — Z5111 Encounter for antineoplastic chemotherapy: Secondary | ICD-10-CM | POA: Diagnosis not present

## 2023-06-14 LAB — RAD ONC ARIA SESSION SUMMARY
Course Elapsed Days: 35
Plan Fractions Treated to Date: 5
Plan Prescribed Dose Per Fraction: 1.8 Gy
Plan Total Fractions Prescribed: 6
Plan Total Prescribed Dose: 10.8 Gy
Reference Point Dosage Given to Date: 43.2 Gy
Reference Point Session Dosage Given: 1.8 Gy
Session Number: 24

## 2023-06-14 MED ORDER — HEPARIN SOD (PORK) LOCK FLUSH 100 UNIT/ML IV SOLN
500.0000 [IU] | Freq: Once | INTRAVENOUS | Status: AC | PRN
  Administered 2023-06-14: 500 [IU]

## 2023-06-14 MED ORDER — SODIUM CHLORIDE 0.9% FLUSH
10.0000 mL | INTRAVENOUS | Status: DC | PRN
  Administered 2023-06-14: 10 mL

## 2023-06-14 MED ORDER — PALONOSETRON HCL INJECTION 0.25 MG/5ML
0.2500 mg | Freq: Once | INTRAVENOUS | Status: AC
  Administered 2023-06-14: 0.25 mg via INTRAVENOUS
  Filled 2023-06-14: qty 5

## 2023-06-14 MED ORDER — SODIUM CHLORIDE 0.9 % IV SOLN
INTRAVENOUS | Status: DC
Start: 2023-06-14 — End: 2023-06-14

## 2023-06-14 MED ORDER — SODIUM CHLORIDE 0.9 % IV SOLN
150.0000 mg | Freq: Once | INTRAVENOUS | Status: AC
  Administered 2023-06-14: 150 mg via INTRAVENOUS
  Filled 2023-06-14: qty 150

## 2023-06-14 MED ORDER — MAGNESIUM SULFATE 4 GM/100ML IV SOLN
4.0000 g | Freq: Once | INTRAVENOUS | Status: AC
  Administered 2023-06-14: 4 g via INTRAVENOUS
  Filled 2023-06-14: qty 100

## 2023-06-14 MED ORDER — DEXAMETHASONE SODIUM PHOSPHATE 10 MG/ML IJ SOLN
10.0000 mg | Freq: Once | INTRAMUSCULAR | Status: AC
  Administered 2023-06-14: 10 mg via INTRAVENOUS
  Filled 2023-06-14: qty 1

## 2023-06-14 MED ORDER — SODIUM CHLORIDE 0.9 % IV SOLN
28.0000 mg/m2 | Freq: Once | INTRAVENOUS | Status: AC
  Administered 2023-06-14: 50 mg via INTRAVENOUS
  Filled 2023-06-14: qty 50

## 2023-06-14 MED ORDER — POTASSIUM CHLORIDE IN NACL 20-0.9 MEQ/L-% IV SOLN
Freq: Once | INTRAVENOUS | Status: AC
  Filled 2023-06-14: qty 1000

## 2023-06-14 NOTE — Progress Notes (Signed)
 Per Dr Bertis Ruddy, ok to run post hydration fluids with Cisplatin.

## 2023-06-14 NOTE — Patient Instructions (Signed)
 [facility] - A DEPT OF [facility]  Discharge Instructions: Thank you for choosing [facility] to provide your oncology and hematology care.   If you have a lab appointment with the Cancer Center, please go directly to the Cancer Center and check in at the registration area.   Wear comfortable clothing and clothing appropriate for easy access to any Portacath or PICC line.   We strive to give you quality time with your provider. You may need to reschedule your appointment if you arrive late (15 or more minutes).  Arriving late affects you and other patients whose appointments are after yours.  Also, if you miss three or more appointments without notifying the office, you may be dismissed from the clinic at the provider's discretion.      For prescription refill requests, have your pharmacy contact our office and allow 72 hours for refills to be completed.    Today you received the following chemotherapy and/or immunotherapy agents cisplatin      To help prevent nausea and vomiting after your treatment, we encourage you to take your nausea medication as directed.  BELOW ARE SYMPTOMS THAT SHOULD BE REPORTED IMMEDIATELY: *FEVER GREATER THAN 100.4 F (38 C) OR HIGHER *CHILLS OR SWEATING *NAUSEA AND VOMITING THAT IS NOT CONTROLLED WITH YOUR NAUSEA MEDICATION *UNUSUAL SHORTNESS OF BREATH *UNUSUAL BRUISING OR BLEEDING *URINARY PROBLEMS (pain or burning when urinating, or frequent urination) *BOWEL PROBLEMS (unusual diarrhea, constipation, pain near the anus) TENDERNESS IN MOUTH AND THROAT WITH OR WITHOUT PRESENCE OF ULCERS (sore throat, sores in mouth, or a toothache) UNUSUAL RASH, SWELLING OR PAIN  UNUSUAL VAGINAL DISCHARGE OR ITCHING   Items with * indicate a potential emergency and should be followed up as soon as possible or go to the Emergency Department if any problems should occur.  Please show the CHEMOTHERAPY ALERT CARD or IMMUNOTHERAPY  ALERT CARD at check-in to the Emergency Department and triage nurse.  Should you have questions after your visit or need to cancel or reschedule your appointment, please contact [facility] - A DEPT OF [facility]  Dept: [phone]  and follow the prompts.  Office hours are 8:00 a.m. to 4:30 p.m. Monday - Friday. Please note that voicemails left after 4:00 p.m. may not be returned until the following business day.  We are closed weekends and major holidays. You have access to a nurse at all times for urgent questions. Please call the main number to the clinic Dept: [phone] and follow the prompts.   For any non-urgent questions, you may also contact your provider using MyChart. We now offer e-Visits for anyone 66 and older to request care online for non-urgent symptoms. For details visit [email or web address].   Also download the MyChart app! Go to the app store, search "MyChart", open the app, select [place], and log in with your MyChart username and password.

## 2023-06-15 ENCOUNTER — Other Ambulatory Visit: Payer: Self-pay

## 2023-06-15 ENCOUNTER — Ambulatory Visit
Admission: RE | Admit: 2023-06-15 | Discharge: 2023-06-15 | Disposition: A | Discharge status: Home / Self Care | Payer: Medicare Other | Source: Ambulatory Visit | Attending: Radiation Oncology | Admitting: Radiation Oncology

## 2023-06-15 DIAGNOSIS — Z5111 Encounter for antineoplastic chemotherapy: Secondary | ICD-10-CM | POA: Diagnosis not present

## 2023-06-15 DIAGNOSIS — Z510 Encounter for antineoplastic radiation therapy: Secondary | ICD-10-CM | POA: Diagnosis not present

## 2023-06-15 DIAGNOSIS — Z7982 Long term (current) use of aspirin: Secondary | ICD-10-CM | POA: Diagnosis not present

## 2023-06-15 DIAGNOSIS — D61818 Other pancytopenia: Secondary | ICD-10-CM | POA: Diagnosis not present

## 2023-06-15 DIAGNOSIS — Z79899 Other long term (current) drug therapy: Secondary | ICD-10-CM | POA: Diagnosis not present

## 2023-06-15 DIAGNOSIS — Z87891 Personal history of nicotine dependence: Secondary | ICD-10-CM | POA: Diagnosis not present

## 2023-06-15 DIAGNOSIS — N183 Chronic kidney disease, stage 3 unspecified: Secondary | ICD-10-CM | POA: Diagnosis not present

## 2023-06-15 LAB — RAD ONC ARIA SESSION SUMMARY
Course Elapsed Days: 36
Plan Fractions Treated to Date: 6
Plan Prescribed Dose Per Fraction: 1.8 Gy
Plan Total Fractions Prescribed: 6
Plan Total Prescribed Dose: 10.8 Gy
Reference Point Dosage Given to Date: 45 Gy
Reference Point Session Dosage Given: 1.8 Gy
Session Number: 25

## 2023-06-18 ENCOUNTER — Other Ambulatory Visit: Payer: Self-pay

## 2023-06-18 ENCOUNTER — Ambulatory Visit
Admission: RE | Admit: 2023-06-18 | Discharge: 2023-06-18 | Disposition: A | Discharge status: Home / Self Care | Payer: Medicare Other | Source: Ambulatory Visit | Attending: Radiation Oncology | Admitting: Radiation Oncology

## 2023-06-18 ENCOUNTER — Inpatient Hospital Stay: Payer: Medicare Other

## 2023-06-18 DIAGNOSIS — R197 Diarrhea, unspecified: Secondary | ICD-10-CM

## 2023-06-18 DIAGNOSIS — Z79899 Other long term (current) drug therapy: Secondary | ICD-10-CM | POA: Diagnosis not present

## 2023-06-18 DIAGNOSIS — Z7982 Long term (current) use of aspirin: Secondary | ICD-10-CM | POA: Diagnosis not present

## 2023-06-18 DIAGNOSIS — C530 Malignant neoplasm of endocervix: Secondary | ICD-10-CM | POA: Diagnosis present

## 2023-06-18 DIAGNOSIS — C538 Malignant neoplasm of overlapping sites of cervix uteri: Secondary | ICD-10-CM

## 2023-06-18 DIAGNOSIS — Z87891 Personal history of nicotine dependence: Secondary | ICD-10-CM | POA: Diagnosis not present

## 2023-06-18 DIAGNOSIS — N183 Chronic kidney disease, stage 3 unspecified: Secondary | ICD-10-CM | POA: Diagnosis not present

## 2023-06-18 DIAGNOSIS — Z5111 Encounter for antineoplastic chemotherapy: Secondary | ICD-10-CM | POA: Diagnosis not present

## 2023-06-18 DIAGNOSIS — Z510 Encounter for antineoplastic radiation therapy: Secondary | ICD-10-CM | POA: Diagnosis not present

## 2023-06-18 DIAGNOSIS — D61818 Other pancytopenia: Secondary | ICD-10-CM | POA: Diagnosis not present

## 2023-06-18 LAB — CBC WITH DIFFERENTIAL/PLATELET
Abs Immature Granulocytes: 0.02 10*3/uL (ref 0.00–0.07)
Basophils Absolute: 0 10*3/uL (ref 0.0–0.1)
Basophils Relative: 0 %
Eosinophils Absolute: 0.1 10*3/uL (ref 0.0–0.5)
Eosinophils Relative: 2 %
HCT: 31.2 % — ABNORMAL LOW (ref 36.0–46.0)
Hemoglobin: 10.9 g/dL — ABNORMAL LOW (ref 12.0–15.0)
Immature Granulocytes: 0 %
Lymphocytes Relative: 5 %
Lymphs Abs: 0.2 10*3/uL — ABNORMAL LOW (ref 0.7–4.0)
MCH: 35 pg — ABNORMAL HIGH (ref 26.0–34.0)
MCHC: 34.9 g/dL (ref 30.0–36.0)
MCV: 100.3 fL — ABNORMAL HIGH (ref 80.0–100.0)
Monocytes Absolute: 0.6 10*3/uL (ref 0.1–1.0)
Monocytes Relative: 12 %
Neutro Abs: 3.6 10*3/uL (ref 1.7–7.7)
Neutrophils Relative %: 81 %
Platelets: 140 10*3/uL — ABNORMAL LOW (ref 150–400)
RBC: 3.11 MIL/uL — ABNORMAL LOW (ref 3.87–5.11)
RDW: 13.6 % (ref 11.5–15.5)
WBC: 4.6 10*3/uL (ref 4.0–10.5)
nRBC: 0 % (ref 0.0–0.2)

## 2023-06-18 LAB — RAD ONC ARIA SESSION SUMMARY
Course Elapsed Days: 39
Plan Fractions Treated to Date: 1
Plan Prescribed Dose Per Fraction: 1.8 Gy
Plan Total Fractions Prescribed: 5
Plan Total Prescribed Dose: 9 Gy
Reference Point Dosage Given to Date: 1.8 Gy
Reference Point Session Dosage Given: 1.8 Gy
Session Number: 26

## 2023-06-18 LAB — COMPREHENSIVE METABOLIC PANEL
ALT: 16 U/L (ref 0–44)
AST: 15 U/L (ref 15–41)
Albumin: 3.9 g/dL (ref 3.5–5.0)
Alkaline Phosphatase: 71 U/L (ref 38–126)
Anion gap: 6 (ref 5–15)
BUN: 16 mg/dL (ref 8–23)
CO2: 24 mmol/L (ref 22–32)
Calcium: 9.4 mg/dL (ref 8.9–10.3)
Chloride: 107 mmol/L (ref 98–111)
Creatinine, Ser: 1.39 mg/dL — ABNORMAL HIGH (ref 0.44–1.00)
GFR, Estimated: 39 mL/min — ABNORMAL LOW (ref >60)
Glucose, Bld: 106 mg/dL — ABNORMAL HIGH (ref 70–99)
Potassium: 4.5 mmol/L (ref 3.5–5.1)
Sodium: 137 mmol/L (ref 135–145)
Total Bilirubin: 0.5 mg/dL (ref 0.0–1.2)
Total Protein: 6.4 g/dL — ABNORMAL LOW (ref 6.5–8.1)

## 2023-06-18 LAB — MAGNESIUM: Magnesium: 1.3 mg/dL — ABNORMAL LOW (ref 1.7–2.4)

## 2023-06-19 ENCOUNTER — Ambulatory Visit
Admission: RE | Admit: 2023-06-19 | Discharge: 2023-06-19 | Disposition: A | Discharge status: Home / Self Care | Payer: Medicare Other | Source: Ambulatory Visit | Attending: Radiation Oncology | Admitting: Radiation Oncology

## 2023-06-19 ENCOUNTER — Other Ambulatory Visit: Payer: Self-pay

## 2023-06-19 ENCOUNTER — Encounter: Payer: Self-pay | Admitting: Hematology and Oncology

## 2023-06-19 ENCOUNTER — Inpatient Hospital Stay (HOSPITAL_BASED_OUTPATIENT_CLINIC_OR_DEPARTMENT_OTHER): Payer: Medicare Other | Admitting: Hematology and Oncology

## 2023-06-19 ENCOUNTER — Ambulatory Visit: Payer: Medicare Other

## 2023-06-19 ENCOUNTER — Other Ambulatory Visit: Payer: Self-pay | Admitting: Radiation Oncology

## 2023-06-19 ENCOUNTER — Other Ambulatory Visit (HOSPITAL_COMMUNITY): Payer: Self-pay

## 2023-06-19 VITALS — BP 102/63 | HR 78 | Temp 99.1°F | Resp 18 | Ht 63.5 in | Wt 159.0 lb

## 2023-06-19 DIAGNOSIS — N1832 Chronic kidney disease, stage 3b: Secondary | ICD-10-CM | POA: Diagnosis not present

## 2023-06-19 DIAGNOSIS — C538 Malignant neoplasm of overlapping sites of cervix uteri: Secondary | ICD-10-CM | POA: Diagnosis not present

## 2023-06-19 DIAGNOSIS — Z5111 Encounter for antineoplastic chemotherapy: Secondary | ICD-10-CM | POA: Diagnosis not present

## 2023-06-19 DIAGNOSIS — Z510 Encounter for antineoplastic radiation therapy: Secondary | ICD-10-CM | POA: Diagnosis not present

## 2023-06-19 DIAGNOSIS — Z79899 Other long term (current) drug therapy: Secondary | ICD-10-CM | POA: Diagnosis not present

## 2023-06-19 DIAGNOSIS — Z87891 Personal history of nicotine dependence: Secondary | ICD-10-CM | POA: Diagnosis not present

## 2023-06-19 DIAGNOSIS — D61818 Other pancytopenia: Secondary | ICD-10-CM | POA: Diagnosis not present

## 2023-06-19 DIAGNOSIS — Z7982 Long term (current) use of aspirin: Secondary | ICD-10-CM | POA: Diagnosis not present

## 2023-06-19 DIAGNOSIS — N183 Chronic kidney disease, stage 3 unspecified: Secondary | ICD-10-CM | POA: Diagnosis not present

## 2023-06-19 LAB — RAD ONC ARIA SESSION SUMMARY
Course Elapsed Days: 40
Plan Fractions Treated to Date: 2
Plan Prescribed Dose Per Fraction: 1.8 Gy
Plan Total Fractions Prescribed: 5
Plan Total Prescribed Dose: 9 Gy
Reference Point Dosage Given to Date: 3.6 Gy
Reference Point Session Dosage Given: 1.8 Gy
Session Number: 27

## 2023-06-19 MED ORDER — DIPHENOXYLATE-ATROPINE 2.5-0.025 MG PO TABS
1.0000 | ORAL_TABLET | Freq: Four times a day (QID) | ORAL | 0 refills | Status: DC | PRN
  Filled 2023-06-19: qty 60, 15d supply, fill #0

## 2023-06-19 MED ORDER — LIDOCAINE-PRILOCAINE 2.5-2.5 % EX CREA
1.0000 | TOPICAL_CREAM | CUTANEOUS | 3 refills | Status: DC | PRN
  Filled 2023-06-19: qty 30, 20d supply, fill #0

## 2023-06-19 MED ORDER — ONDANSETRON HCL 8 MG PO TABS
8.0000 mg | ORAL_TABLET | Freq: Three times a day (TID) | ORAL | 3 refills | Status: DC | PRN
  Filled 2023-06-19: qty 30, 10d supply, fill #0

## 2023-06-19 NOTE — Assessment & Plan Note (Signed)
 Overall, her renal function is stable We discussed importance of adequate fluid hydration

## 2023-06-19 NOTE — Assessment & Plan Note (Signed)
 She has completed chemotherapy She is still undergoing radiation therapy We discussed test results and future follow-up Continue supportive care

## 2023-06-19 NOTE — Assessment & Plan Note (Signed)
 Her magnesium level is still low Due to frequent diarrhea, I do not recommend oral magnesium replacement I gave her patient education sheet to focus on diet rich in magnesium I plan to recheck again in 8 weeks

## 2023-06-19 NOTE — Progress Notes (Signed)
 [facility] OFFICE PROGRESS NOTE  Patient Care Team: [name] ORN, Paula [name] as PCP - General (Internal Medicine) Patrcia Sharper, Paula [name] as Consulting Physician (Ophthalmology) Dallie Vera GAILS, Paula [name] as Consulting Physician (Gynecology)  ASSESSMENT & PLAN:  Malignant neoplasm of cervix [facility]) She has completed chemotherapy She is still undergoing radiation therapy We discussed test results and future follow-up Continue supportive care  Pancytopenia, acquired [facility]) Due to side effects of treatment She is not symptomatic I anticipate improvement with discontinuation of treatment Plan to repeat labs in 2 months  CKD (chronic kidney disease) stage 3, GFR 30-59 ml/min (HCC) Overall, her renal function is stable We discussed importance of adequate fluid hydration  Hypomagnesemia Her magnesium  level is still low Due to frequent diarrhea, I do not recommend oral magnesium  replacement I gave her patient education sheet to focus on diet rich in magnesium  I plan to recheck again in 8 weeks  No orders of the defined types were placed in this encounter.   All questions were answered. The patient knows to call the clinic with any problems, questions or concerns. The total time spent in the appointment was 25 minutes encounter with patients including review of chart and various tests results, discussions about plan of care and coordination of care plan   Paula [name], Paula [name] 06/19/2023 11:53 AM  INTERVAL HISTORY: Please see below for problem oriented charting. she returns for surveillance follow-up after completion of chemotherapy She is doing well Her only side effect from treatment is diarrhea No worsening peripheral neuropathy  REVIEW OF SYSTEMS:   Constitutional: Denies fevers, chills or abnormal weight loss Eyes: Denies blurriness of vision Ears, nose, mouth, throat, and face: Denies mucositis or sore throat Respiratory: Denies cough, dyspnea or wheezes Cardiovascular: Denies  palpitation, chest discomfort or lower extremity swelling Skin: Denies abnormal skin rashes Lymphatics: Denies new lymphadenopathy or easy bruising Neurological:Denies numbness, tingling or new weaknesses Behavioral/Psych: Mood is stable, no new changes  All other systems were reviewed with the patient and are negative.  I have reviewed the past medical history, past surgical history, social history and family history with the patient and they are unchanged from previous note.  ALLERGIES:  is allergic to penicillins.  MEDICATIONS:  Current Outpatient Medications  Medication Sig Dispense Refill   aspirin EC 81 MG tablet Take 81 mg by mouth daily.     Cholecalciferol (VITAMIN D3) 2000 units TABS Take by mouth.     diphenoxylate -atropine  (LOMOTIL ) 2.5-0.025 MG tablet Take 1 tablet by mouth 4 (four) times daily as needed for diarrhea or loose stools. 60 tablet 0   divalproex  (DEPAKOTE  ER) 250 MG 24 hr tablet Take 3 tablets (750 mg total) by mouth every morning. 30 tablet 0   ezetimibe  (ZETIA ) 10 MG tablet TAKE 1 TABLET BY MOUTH EVERY DAY 90 tablet 3   famotidine  (PEPCID ) 20 MG tablet TAKE 1 TABLET BY MOUTH TWICE A DAY 180 tablet 3   FLUoxetine (PROZAC) 10 MG capsule Take 10 mg by mouth daily. Takes 2 one time daily  2   INGREZZA 60 MG capsule Take 60 mg by mouth daily.     lidocaine -prilocaine  (EMLA ) cream Apply topically as needed. 30 g 3   linaclotide  (LINZESS ) 145 MCG CAPS capsule Take 1 capsule (145 mcg total) by mouth daily as needed. 30 capsule 11   multivitamin-iron-minerals-folic acid  (CENTRUM) chewable tablet Chew 1 tablet by mouth daily.     MYRBETRIQ  50 MG TB24 tablet TAKE 1 TABLET BY MOUTH EVERY DAY 90  tablet 3   Omega-3 Fatty Acids (FISH OIL) 1000 MG CAPS Take 2 capsules by mouth daily.     ondansetron  (ZOFRAN ) 8 MG tablet Take 1 tablet (8 mg total) by mouth every 8 (eight) hours as needed for nausea. 30 tablet 3   pravastatin  (PRAVACHOL ) 40 MG tablet TAKE 1 TABLET BY MOUTH EVERY  DAY 90 tablet 3   prochlorperazine  (COMPAZINE ) 10 MG tablet Take 1 tablet (10 mg total) by mouth every 6 (six) hours as needed for nausea or vomiting. 30 tablet 3   triamcinolone  cream (KENALOG ) 0.1 % APPLY TO AFFECTED AREA TWICE A DAY 30 g 1   trimethoprim  (TRIMPEX ) 100 MG tablet Take 1 tablet (100 mg total) by mouth daily. 90 tablet 3   zolpidem  (AMBIEN ) 5 MG tablet Take 1 tablet (5 mg total) by mouth at bedtime as needed for sleep. 30 tablet 5   No current facility-administered medications for this visit.    SUMMARY OF ONCOLOGIC HISTORY: Oncology History Overview Note  PD-L1 is negative, CPS <1%   Malignant neoplasm of cervix (HCC)  03/08/2023 Initial Diagnosis   Presented to OB/GYN for postmenopausal bleeding. Pap with HSIL, HPV 16 positive   03/26/2023 Initial Biopsy   Cervical biopsy: A. ANTERIOR RIGHT CERVICAL MASS, BIOPSY:       Invasive squamous cell carcinoma, well-differentiated,  keratinizing.    04/05/2023 Cancer Staging   Staging form: Cervix Uteri, AJCC Version 9 - Clinical stage from 04/05/2023: FIGO Stage IIIC1r, calculated as Stage IIIC1 (cT2b, cN1, cM0) - Signed by [name], Paula [name] on 04/16/2023 Histopathologic type: Squamous cell carcinoma, NOS Stage prefix: Initial diagnosis Stage confirmation method: Imaging Pelvic nodal status: Positive Pelvic nodal method of assessment: PET Para-aortic status: Negative Para-aortic nodal method of assessment: PET Histologic grade (G): G1 Histologic grading system: 3 grade system   04/05/2023 Imaging   PET: IMPRESSION: 1. The cervical mass is hypermetabolic compatible with primary cervical carcinoma. 2. 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 3. No signs of solid organ metastasis. 4. Possible exophytic lesion off the upper pole of the left kidney measuring 2 cm. Difficult to confirm within the limitations of nondiagnostic CT and unenhanced technique. Consider  further evaluation with renal ultrasound. 5. Coronary artery calcifications. 6.  Aortic Atherosclerosis (ICD10-I70.0).   04/30/2023 Procedure   Placement of a subcutaneous power-injectable port device. Catheter tip at the superior cavoatrial junction.   05/09/2023 -  Chemotherapy   Patient is on Treatment Plan : Cervical cancer Cisplatin  (35) q7d + XRT x 6 cycles       PHYSICAL EXAMINATION: ECOG PERFORMANCE STATUS: 1 - Symptomatic but completely ambulatory  Vitals:   06/19/23 1118  BP: 102/63  Pulse: 78  Resp: 18  Temp: 99.1 F (37.3 C)  SpO2: 97%   Filed Weights   06/19/23 1118  Weight: 159 lb (72.1 kg)    GENERAL:alert, no distress and comfortable  LABORATORY DATA:  I have reviewed the data as listed    Component Value Date/Time   NA 137 06/18/2023 1056   K 4.5 06/18/2023 1056   CL 107 06/18/2023 1056   CO2 24 06/18/2023 1056   GLUCOSE 106 (H) 06/18/2023 1056   BUN 16 06/18/2023 1056   CREATININE 1.39 (H) 06/18/2023 1056   CREATININE 1.40 (H) 06/11/2023 1108   CREATININE 1.44 (H) 12/11/2019 1445   CALCIUM 9.4 06/18/2023 1056   PROT 6.4 (L) 06/18/2023 1056   ALBUMIN 3.9 06/18/2023 1056   AST 15 06/18/2023 1056  ALT 16 06/18/2023 1056   ALKPHOS 71 06/18/2023 1056   BILITOT 0.5 06/18/2023 1056   GFRNONAA 39 (L) 06/18/2023 1056   GFRNONAA 39 (L) 06/11/2023 1108   GFRAA 53 (L) 07/31/2017 1106    No results found for: SPEP, UPEP  Lab Results  Component Value Date   WBC 4.6 06/18/2023   NEUTROABS 3.6 06/18/2023   HGB 10.9 (L) 06/18/2023   HCT 31.2 (L) 06/18/2023   MCV 100.3 (H) 06/18/2023   PLT 140 (L) 06/18/2023      Chemistry      Component Value Date/Time   NA 137 06/18/2023 1056   K 4.5 06/18/2023 1056   CL 107 06/18/2023 1056   CO2 24 06/18/2023 1056   BUN 16 06/18/2023 1056   CREATININE 1.39 (H) 06/18/2023 1056   CREATININE 1.40 (H) 06/11/2023 1108   CREATININE 1.44 (H) 12/11/2019 1445      Component Value Date/Time   CALCIUM 9.4  06/18/2023 1056   ALKPHOS 71 06/18/2023 1056   AST 15 06/18/2023 1056   ALT 16 06/18/2023 1056   BILITOT 0.5 06/18/2023 1056

## 2023-06-19 NOTE — Assessment & Plan Note (Signed)
 Due to side effects of treatment She is not symptomatic I anticipate improvement with discontinuation of treatment Plan to repeat labs in 2 months

## 2023-06-20 ENCOUNTER — Other Ambulatory Visit: Payer: Self-pay

## 2023-06-20 ENCOUNTER — Ambulatory Visit: Payer: Medicare Other | Admitting: Internal Medicine

## 2023-06-20 ENCOUNTER — Ambulatory Visit
Admission: RE | Admit: 2023-06-20 | Discharge: 2023-06-20 | Disposition: A | Discharge status: Home / Self Care | Payer: Medicare Other | Source: Ambulatory Visit | Attending: Radiation Oncology | Admitting: Radiation Oncology

## 2023-06-20 DIAGNOSIS — N183 Chronic kidney disease, stage 3 unspecified: Secondary | ICD-10-CM | POA: Diagnosis not present

## 2023-06-20 DIAGNOSIS — D61818 Other pancytopenia: Secondary | ICD-10-CM | POA: Diagnosis not present

## 2023-06-20 DIAGNOSIS — Z5111 Encounter for antineoplastic chemotherapy: Secondary | ICD-10-CM | POA: Diagnosis not present

## 2023-06-20 DIAGNOSIS — Z87891 Personal history of nicotine dependence: Secondary | ICD-10-CM | POA: Diagnosis not present

## 2023-06-20 DIAGNOSIS — Z510 Encounter for antineoplastic radiation therapy: Secondary | ICD-10-CM | POA: Diagnosis not present

## 2023-06-20 DIAGNOSIS — Z79899 Other long term (current) drug therapy: Secondary | ICD-10-CM | POA: Diagnosis not present

## 2023-06-20 DIAGNOSIS — Z7982 Long term (current) use of aspirin: Secondary | ICD-10-CM | POA: Diagnosis not present

## 2023-06-20 LAB — RAD ONC ARIA SESSION SUMMARY
Course Elapsed Days: 41
Plan Fractions Treated to Date: 3
Plan Prescribed Dose Per Fraction: 1.8 Gy
Plan Total Fractions Prescribed: 5
Plan Total Prescribed Dose: 9 Gy
Reference Point Dosage Given to Date: 5.4 Gy
Reference Point Session Dosage Given: 1.8 Gy
Session Number: 28

## 2023-06-21 ENCOUNTER — Inpatient Hospital Stay: Payer: Medicare Other

## 2023-06-21 ENCOUNTER — Ambulatory Visit
Admission: RE | Admit: 2023-06-21 | Discharge: 2023-06-21 | Disposition: A | Discharge status: Home / Self Care | Payer: Medicare Other | Source: Ambulatory Visit | Attending: Radiation Oncology | Admitting: Radiation Oncology

## 2023-06-21 ENCOUNTER — Other Ambulatory Visit: Payer: Self-pay

## 2023-06-21 ENCOUNTER — Inpatient Hospital Stay (HOSPITAL_BASED_OUTPATIENT_CLINIC_OR_DEPARTMENT_OTHER): Payer: Medicare Other | Admitting: Internal Medicine

## 2023-06-21 VITALS — BP 128/74 | HR 78 | Temp 98.1°F | Resp 18 | Ht 63.5 in | Wt 162.4 lb

## 2023-06-21 DIAGNOSIS — D61818 Other pancytopenia: Secondary | ICD-10-CM | POA: Diagnosis not present

## 2023-06-21 DIAGNOSIS — Z5111 Encounter for antineoplastic chemotherapy: Secondary | ICD-10-CM | POA: Diagnosis not present

## 2023-06-21 DIAGNOSIS — Z510 Encounter for antineoplastic radiation therapy: Secondary | ICD-10-CM | POA: Diagnosis not present

## 2023-06-21 DIAGNOSIS — R251 Tremor, unspecified: Secondary | ICD-10-CM

## 2023-06-21 DIAGNOSIS — Z79899 Other long term (current) drug therapy: Secondary | ICD-10-CM | POA: Diagnosis not present

## 2023-06-21 DIAGNOSIS — N183 Chronic kidney disease, stage 3 unspecified: Secondary | ICD-10-CM | POA: Diagnosis not present

## 2023-06-21 DIAGNOSIS — R296 Repeated falls: Secondary | ICD-10-CM

## 2023-06-21 DIAGNOSIS — R413 Other amnesia: Secondary | ICD-10-CM

## 2023-06-21 DIAGNOSIS — C538 Malignant neoplasm of overlapping sites of cervix uteri: Secondary | ICD-10-CM

## 2023-06-21 DIAGNOSIS — R197 Diarrhea, unspecified: Secondary | ICD-10-CM

## 2023-06-21 DIAGNOSIS — Z7982 Long term (current) use of aspirin: Secondary | ICD-10-CM | POA: Diagnosis not present

## 2023-06-21 DIAGNOSIS — Z87891 Personal history of nicotine dependence: Secondary | ICD-10-CM | POA: Diagnosis not present

## 2023-06-21 LAB — COMPREHENSIVE METABOLIC PANEL
ALT: 12 U/L (ref 0–44)
AST: 14 U/L — ABNORMAL LOW (ref 15–41)
Albumin: 3.8 g/dL (ref 3.5–5.0)
Alkaline Phosphatase: 71 U/L (ref 38–126)
Anion gap: 7 (ref 5–15)
BUN: 18 mg/dL (ref 8–23)
CO2: 24 mmol/L (ref 22–32)
Calcium: 9.7 mg/dL (ref 8.9–10.3)
Chloride: 108 mmol/L (ref 98–111)
Creatinine, Ser: 1.47 mg/dL — ABNORMAL HIGH (ref 0.44–1.00)
GFR, Estimated: 36 mL/min — ABNORMAL LOW (ref >60)
Glucose, Bld: 103 mg/dL — ABNORMAL HIGH (ref 70–99)
Potassium: 4.6 mmol/L (ref 3.5–5.1)
Sodium: 139 mmol/L (ref 135–145)
Total Bilirubin: 0.4 mg/dL (ref 0.0–1.2)
Total Protein: 6.3 g/dL — ABNORMAL LOW (ref 6.5–8.1)

## 2023-06-21 LAB — RAD ONC ARIA SESSION SUMMARY
Course Elapsed Days: 42
Plan Fractions Treated to Date: 4
Plan Prescribed Dose Per Fraction: 1.8 Gy
Plan Total Fractions Prescribed: 5
Plan Total Prescribed Dose: 9 Gy
Reference Point Dosage Given to Date: 7.2 Gy
Reference Point Session Dosage Given: 1.8 Gy
Session Number: 29

## 2023-06-21 LAB — CBC WITH DIFFERENTIAL/PLATELET
Abs Immature Granulocytes: 0.03 10*3/uL (ref 0.00–0.07)
Basophils Absolute: 0 10*3/uL (ref 0.0–0.1)
Basophils Relative: 1 %
Eosinophils Absolute: 0.2 10*3/uL (ref 0.0–0.5)
Eosinophils Relative: 4 %
HCT: 30 % — ABNORMAL LOW (ref 36.0–46.0)
Hemoglobin: 10.4 g/dL — ABNORMAL LOW (ref 12.0–15.0)
Immature Granulocytes: 1 %
Lymphocytes Relative: 5 %
Lymphs Abs: 0.2 10*3/uL — ABNORMAL LOW (ref 0.7–4.0)
MCH: 35.6 pg — ABNORMAL HIGH (ref 26.0–34.0)
MCHC: 34.7 g/dL (ref 30.0–36.0)
MCV: 102.7 fL — ABNORMAL HIGH (ref 80.0–100.0)
Monocytes Absolute: 0.5 10*3/uL (ref 0.1–1.0)
Monocytes Relative: 12 %
Neutro Abs: 3.4 10*3/uL (ref 1.7–7.7)
Neutrophils Relative %: 77 %
Platelets: 133 10*3/uL — ABNORMAL LOW (ref 150–400)
RBC: 2.92 MIL/uL — ABNORMAL LOW (ref 3.87–5.11)
RDW: 13.9 % (ref 11.5–15.5)
WBC: 4.3 10*3/uL (ref 4.0–10.5)
nRBC: 0 % (ref 0.0–0.2)

## 2023-06-21 NOTE — Progress Notes (Signed)
[facility] [address]  [place], [place] [address] [phone]   Interval Evaluation  Date of Service: 06/21/23 Patient Name: Paula [name] Patient MRN: [id number] Patient DOB: January 22, 1946 Provider: Henreitta Leber, MD  Identifying Statement:  Paula [name] is a 78 y.o. female with Memory loss  Recurrent falls  Tremor   Primary Cancer:  Oncologic History: Oncology History Overview Note  PD-L1 is negative, CPS <1%   Malignant neoplasm of cervix (HCC)  03/08/2023 Initial Diagnosis   Presented to OB/GYN for postmenopausal bleeding. Pap with HSIL, HPV 16 positive   03/26/2023 Initial Biopsy   Cervical biopsy: A. ANTERIOR RIGHT CERVICAL MASS, BIOPSY:       Invasive squamous cell carcinoma, well-differentiated,  keratinizing.    04/05/2023 Cancer Staging   Staging form: Cervix Uteri, AJCC Version 9 - Clinical stage from 04/05/2023: FIGO Stage IIIC1r, calculated as Stage IIIC1 (cT2b, cN1, cM0) - Signed by Clide Cliff, MD on 04/16/2023 Histopathologic type: Squamous cell carcinoma, NOS Stage prefix: Initial diagnosis Stage confirmation method: Imaging Pelvic nodal status: Positive Pelvic nodal method of assessment: PET Para-aortic status: Negative Para-aortic nodal method of assessment: PET Histologic grade (G): G1 Histologic grading system: 3 grade system   04/05/2023 Imaging   PET: IMPRESSION: 1. The cervical mass is hypermetabolic compatible with primary cervical carcinoma. 2. 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 3. No signs of solid organ metastasis. 4. Possible exophytic lesion off the upper pole of the left kidney measuring 2 cm. Difficult to confirm within the limitations of nondiagnostic CT and unenhanced technique. Consider further evaluation with renal ultrasound. 5. Coronary artery calcifications. 6.  Aortic Atherosclerosis (ICD10-I70.0).   04/30/2023  Procedure   Placement of a subcutaneous power-injectable port device. Catheter tip at the superior cavoatrial junction.   05/09/2023 -  Chemotherapy   Patient is on Treatment Plan : Cervical cancer Cisplatin (35) q7d + XRT x 6 cycles       Interval History: Paula [name] presents today for follow up, now having completed chemotherapy with Dr. Bertis Ruddy.  She is due to complete radiation tomorrow.  No changes with respect to her cognition, memory, balance, tremors.  No recent falls.  Continues on same medications as prior, no changes or updates.  H+P (05/08/23) Patient presents today to review recent neurologic symptoms.  She and her son describe years long history of balance impairment, memory, impairment, hand tremors.  In recent weeks there have been additional falls; she tends to fall backwards.  Other complaints, tremor, slow movements are not necessarily worsened recently.  Has longstanding history of bipolar and neuroleptic use, as well as alcohol abuse.  She is being treated currently for tardive dyskinesia through her psychiatrist with [facility], with modest results.  Has taken ambien every night for "many years, decades".  Currently dosing depakote for bipolar d/o.  Has upcoming plans to initiate chemotherapy and radiation for cervical cancer.  Medications: Current Outpatient Medications on File Prior to Visit  Medication Sig Dispense Refill   aspirin EC 81 MG tablet Take 81 mg by mouth daily.     Cholecalciferol (VITAMIN D3) 2000 units TABS Take by mouth.     diphenoxylate-atropine (LOMOTIL) 2.5-0.025 MG tablet Take 1 tablet by mouth 4 (four) times daily as needed for diarrhea or loose stools. 60 tablet 0   ezetimibe (ZETIA) 10 MG tablet TAKE 1 TABLET BY MOUTH EVERY DAY 90 tablet 3   famotidine (PEPCID) 20 MG tablet  TAKE 1 TABLET BY MOUTH TWICE A DAY 180 tablet 3   FLUoxetine (PROZAC) 10 MG capsule Take 10 mg by mouth daily. Takes 2 one time daily  2   INGREZZA 60 MG capsule Take 60 mg  by mouth daily.     lidocaine-prilocaine (EMLA) cream Apply topically as needed. 30 g 3   linaclotide (LINZESS) 145 MCG CAPS capsule Take 1 capsule (145 mcg total) by mouth daily as needed. 30 capsule 11   multivitamin-iron-minerals-folic acid (CENTRUM) chewable tablet Chew 1 tablet by mouth daily.     MYRBETRIQ 50 MG TB24 tablet TAKE 1 TABLET BY MOUTH EVERY DAY 90 tablet 3   Omega-3 Fatty Acids (FISH OIL) 1000 MG CAPS Take 2 capsules by mouth daily.     ondansetron (ZOFRAN) 8 MG tablet Take 1 tablet (8 mg total) by mouth every 8 (eight) hours as needed for nausea. 30 tablet 3   pravastatin (PRAVACHOL) 40 MG tablet TAKE 1 TABLET BY MOUTH EVERY DAY 90 tablet 3   prochlorperazine (COMPAZINE) 10 MG tablet Take 1 tablet (10 mg total) by mouth every 6 (six) hours as needed for nausea or vomiting. 30 tablet 3   triamcinolone cream (KENALOG) 0.1 % APPLY TO AFFECTED AREA TWICE A DAY 30 g 1   trimethoprim (TRIMPEX) 100 MG tablet Take 1 tablet (100 mg total) by mouth daily. 90 tablet 3   zolpidem (AMBIEN) 5 MG tablet Take 1 tablet (5 mg total) by mouth at bedtime as needed for sleep. 30 tablet 5   No current facility-administered medications on file prior to visit.    Allergies:  Allergies  Allergen Reactions   Penicillins Other (See Comments)    Has taken and not had reaction, allergist did several tests and told her not to take   Past Medical History:  Past Medical History:  Diagnosis Date   Allergy    Arthritis    Bipolar 1 disorder (HCC)    CKD (chronic kidney disease)    Colon polyps    COPD (chronic obstructive pulmonary disease) (HCC)    Depression    Diabetes mellitus without complication (HCC)    GERD (gastroesophageal reflux disease)    History of alcohol abuse    History of chicken pox    History of drug abuse (HCC)    Hyperlipidemia    Hypertension    Mitral valve regurgitation    Urine incontinence    Past Surgical History:  Past Surgical History:  Procedure Laterality  Date   APPENDECTOMY     BREAST BIOPSY     CHOLECYSTECTOMY     IR IMAGING GUIDED PORT INSERTION  04/30/2023   LUNG BIOPSY     SUBTOTAL COLECTOMY  2005   [facility] [place]   Social History:  Social History   Socioeconomic History   Marital status: Single    Spouse name: Not on file   Number of children: 1   Years of education: 14   Highest education level: Not on file  Occupational History   Occupation: Retired Charity fundraiser  Tobacco Use   Smoking status: Former    Current packs/day: 0.00    Average packs/day: 1 pack/day for 55.0 years (55.0 ttl pk-yrs)    Types: Cigarettes    Start date: 08/03/1961    Quit date: 08/03/2016    Years since quitting: 6.8   Smokeless tobacco: Never  Vaping Use   Vaping status: Never Used  Substance and Sexual Activity   Alcohol use: Yes    Alcohol/week:  4.0 - 6.0 standard drinks of alcohol    Types: 4 - 6 Cans of beer per week    Comment: Occ   Drug use: Yes    Frequency: 7.0 times per week    Types: Marijuana   Sexual activity: Not Currently    Birth control/protection: Post-menopausal  Other Topics Concern   Not on file  Social History Narrative   Fun/Hobby: Read   Denies abuse and feels safe at home    Social Drivers of Health   Financial Resource Strain: Low Risk  (03/22/2022)   Overall Financial Resource Strain (CARDIA)    Difficulty of Paying Living Expenses: Not hard at all  Food Insecurity: No Food Insecurity (03/22/2023)   Hunger Vital Sign    Worried About Running Out of Food in the Last Year: Never true    Ran Out of Food in the Last Year: Never true  Transportation Needs: No Transportation Needs (03/22/2023)   PRAPARE - Administrator, Civil Service (Medical): No    Lack of Transportation (Non-Medical): No  Physical Activity: Inactive (03/22/2022)   Exercise Vital Sign    Days of Exercise per Week: 0 days    Minutes of Exercise per Session: 0 min  Stress: No Stress Concern Present (03/22/2022)   [employer] of  Occupational Health - Occupational Stress Questionnaire    Feeling of Stress : Not at all  Social Connections: Socially Isolated (03/22/2022)   Social Connection and Isolation Panel [NHANES]    Frequency of Communication with Friends and Family: More than three times a week    Frequency of Social Gatherings with Friends and Family: More than three times a week    Attends Religious Services: Never    Database administrator or Organizations: No    Attends Banker Meetings: Never    Marital Status: Divorced  Catering manager Violence: Not At Risk (03/22/2023)   Humiliation, Afraid, Rape, and Kick questionnaire    Fear of Current or Ex-Partner: No    Emotionally Abused: No    Physically Abused: No    Sexually Abused: No   Family History:  Family History  Problem Relation Age of Onset   Hyperlipidemia Mother    COPD Mother        smoked   Arthritis Father    Heart disease Father    Diabetes Father    Prostate cancer Brother    Heart disease Paternal Grandmother    Heart disease Paternal Uncle    Breast cancer Neg Hx    Ovarian cancer Neg Hx    Colon cancer Neg Hx    Endometrial cancer Neg Hx     Review of Systems: Constitutional: Doesn't report fevers, chills or abnormal weight loss Eyes: Doesn't report blurriness of vision Ears, nose, mouth, throat, and face: Doesn't report sore throat Respiratory: Doesn't report cough, dyspnea or wheezes Cardiovascular: Doesn't report palpitation, chest discomfort  Gastrointestinal:  Doesn't report nausea, constipation, diarrhea GU: Doesn't report incontinence Skin: Doesn't report skin rashes Neurological: Per HPI Musculoskeletal: Doesn't report joint pain Behavioral/Psych: Doesn't report anxiety  Physical Exam: Vitals:   06/21/23 1104  BP: 128/74  Pulse: 78  Resp: 18  Temp: 98.1 F (36.7 C)  SpO2: 98%   KPS: 70. General: Alert, cooperative, pleasant, in no acute distress Head: Normal EENT: No conjunctival  injection or scleral icterus.  Lungs: Resp effort normal Cardiac: Regular rate Abdomen: Non-distended abdomen Skin: No rashes cyanosis or petechiae. Extremities: No clubbing or  edema  Neurologic Exam: Mental Status: Awake, alert, attentive to examiner. Oriented to self and environment. Language is fluent with intact comprehension.  Impaired recall, age advanced psychomotor slowing. Cranial Nerves: Visual acuity is grossly normal. Visual fields are full. Extra-ocular movements intact. No ptosis. Face is symmetric Motor: Tone and bulk are normal. Noted high frequency rest tremor in both hands, polymini-myoclonus with posturing.  Power is full in both arms and legs. Reflexes are symmetric, no pathologic reflexes present.  Sensory: Intact to light touch Gait: Dystaxic with reduced arm carriage and turning en-bloc   Labs: I have reviewed the data as listed    Component Value Date/Time   NA 139 06/21/2023 1040   K 4.6 06/21/2023 1040   CL 108 06/21/2023 1040   CO2 24 06/21/2023 1040   GLUCOSE 103 (H) 06/21/2023 1040   BUN 18 06/21/2023 1040   CREATININE 1.47 (H) 06/21/2023 1040   CREATININE 1.40 (H) 06/11/2023 1108   CREATININE 1.44 (H) 12/11/2019 1445   CALCIUM 9.7 06/21/2023 1040   PROT 6.3 (L) 06/21/2023 1040   ALBUMIN 3.8 06/21/2023 1040   AST 14 (L) 06/21/2023 1040   ALT 12 06/21/2023 1040   ALKPHOS 71 06/21/2023 1040   BILITOT 0.4 06/21/2023 1040   GFRNONAA 36 (L) 06/21/2023 1040   GFRNONAA 39 (L) 06/11/2023 1108   GFRAA 53 (L) 07/31/2017 1106   Lab Results  Component Value Date   WBC 4.3 06/21/2023   NEUTROABS 3.4 06/21/2023   HGB 10.4 (L) 06/21/2023   HCT 30.0 (L) 06/21/2023   MCV 102.7 (H) 06/21/2023   PLT 133 (L) 06/21/2023    Imaging: CLINICAL DATA:  Memory loss   EXAM: MRI HEAD WITHOUT CONTRAST   TECHNIQUE: Multiplanar, multiecho pulse sequences of the brain and surrounding structures were obtained without intravenous contrast.   COMPARISON:  None  Available.   FINDINGS: Brain: Generalized atrophy, moderate in degree. Hippocampal atrophy bilaterally. Ventricular enlargement consistent with the level of atrophy.   Negative for acute infarct. Moderate white matter hyperintensity most compatible with chronic microvascular ischemia. Negative for hemorrhage, mass, or fluid collection.   Vascular: Normal arterial flow voids   Skull and upper cervical spine: Negative   Sinuses/Orbits: Paranasal sinuses clear. Bilateral cataract extraction   Other: None   IMPRESSION: 1. Moderate atrophy. Hippocampal atrophy bilaterally. 2. Moderate chronic microvascular ischemic change in the white matter. No acute abnormality.     Electronically Signed   By: Marlan Palau M.D.   On: 04/06/2022 15:28   Assessment/Plan Memory loss  Recurrent falls  Tremor  Francoise Ceo present with constellation of neurologic symptoms; dystaxia with falls, tremors, age advanced cognitive impairment.  There are some parkinsonian features.  Etiology is suspected multifactorial, given long history of neuroleptic exposure and down-regulation of dopaminergic pathways, significant brain atrophy noted on MRI, history of alcohol abuse.  Additionally, concurrent use of drugs which can depress brain function and balance such as Zolpidem.  Most recent fall was following dosing Oxycodone. Depakote could also be causing or exacerbating tremors.   Today we discussed and recommended trial of cessation of depakote, given its potential role in exacerbating these symptoms.  She has had quite stable mood/mania symptoms.  We again counseled her on risks of ambien and other neuroleptic medications, their implication in movement disorders, depressed CNS function.  She will continue to have these medications managed by her primary psychiatrist.  Will con't to follow up with PT referral placed earlier.  We appreciate the opportunity to  participate in the care of ANJOLI MARSILI.  We will evaluate her again on as needed basis, she will follow up with psychiatry for further med management.  All questions were answered. The patient knows to call the clinic with any problems, questions or concerns. No barriers to learning were detected.  The total time spent in the encounter was 40 minutes and more than 50% was on counseling and review of test results   Henreitta Leber, MD Medical Director of Neuro-Oncology [facility] 06/21/23 11:46 AM

## 2023-06-22 ENCOUNTER — Ambulatory Visit
Admission: RE | Admit: 2023-06-22 | Discharge: 2023-06-22 | Disposition: A | Discharge status: Home / Self Care | Payer: Medicare Other | Source: Ambulatory Visit | Attending: Radiation Oncology | Admitting: Radiation Oncology

## 2023-06-22 ENCOUNTER — Other Ambulatory Visit: Payer: Self-pay

## 2023-06-22 DIAGNOSIS — Z510 Encounter for antineoplastic radiation therapy: Secondary | ICD-10-CM | POA: Diagnosis not present

## 2023-06-22 DIAGNOSIS — Z87891 Personal history of nicotine dependence: Secondary | ICD-10-CM | POA: Diagnosis not present

## 2023-06-22 DIAGNOSIS — Z5111 Encounter for antineoplastic chemotherapy: Secondary | ICD-10-CM | POA: Diagnosis not present

## 2023-06-22 DIAGNOSIS — D61818 Other pancytopenia: Secondary | ICD-10-CM | POA: Diagnosis not present

## 2023-06-22 DIAGNOSIS — N183 Chronic kidney disease, stage 3 unspecified: Secondary | ICD-10-CM | POA: Diagnosis not present

## 2023-06-22 DIAGNOSIS — Z79899 Other long term (current) drug therapy: Secondary | ICD-10-CM | POA: Diagnosis not present

## 2023-06-22 DIAGNOSIS — Z7982 Long term (current) use of aspirin: Secondary | ICD-10-CM | POA: Diagnosis not present

## 2023-06-22 LAB — RAD ONC ARIA SESSION SUMMARY
Course Elapsed Days: 43
Plan Fractions Treated to Date: 5
Plan Prescribed Dose Per Fraction: 1.8 Gy
Plan Total Fractions Prescribed: 5
Plan Total Prescribed Dose: 9 Gy
Reference Point Dosage Given to Date: 9 Gy
Reference Point Session Dosage Given: 1.8 Gy
Session Number: 30

## 2023-06-24 NOTE — Progress Notes (Signed)
  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 Identity was confirmed. All relevant records and images related to the planned course of therapy were reviewed. The patient freely provided informed written consent to proceed with treatment after reviewing the details related to the planned course of therapy. The consent form was witnessed and verified by the simulation staff. Then, the patient was set-up in a stable reproducible supine position for radiation therapy. The tandem ring system was accessed and fiducial markers were placed within the tandem and ring.   Simple treatment device note: While in the operating room the patient had construction of her custom tandem ring system. She will be treated with a 60 tandem/ring system. The patient had placement of a 40 mm tandem. A cervical ring with cysto-tubing was used for her treatment. A rectal paddle could not be placed due to patient's anatomy so vaginal packing soaked in Estrace cream was placed.  Verification simulation note: An AP and lateral film was obtained through the pelvis area. This was compared to the patient's planning films documenting accurate position of the tandem/ring system for treatment.  High-dose-rate brachytherapy treatment note:   The remote afterloading device was accessed through catheter system and attached to the tandem ring system. Patient then proceeded to  undergo her first high-dose-rate treatment directed at the cervix. The patient was prescribed a dose of 5.5 gray to be delivered to the HRCTV.Marland Kitchen Patient was treated with 2 channels using 19 dwell positions. Treatment time was 246.0 seconds. The patient tolerated the procedure well. After completion of her therapy, a radiation survey was performed documenting return of the iridium source into the GammaMed safe. The patient was then transferred to the nursing suite.  She then had removal of the vaginal packing followed by removal of the tandem ring system.  PLAN: The patient will return next week for her second HDR treatment.  ________________________________   Billie Lade, PhD, MD  This document serves as a record of services personally performed by Antony Blackbird, MD. It was created on his behalf by Herbie Saxon, a trained medical scribe. The creation of this record is based on the scribe's personal observations and the provider's statements to them. This document has been checked and approved by the attending provider.

## 2023-06-25 NOTE — Radiation Completion Notes (Addendum)
  Radiation Oncology         [phone] ________________________________  Name: Paula [name] MRN: [id number]  Date of Service: 06/22/2023  DOB: Nov 09, 1945  End of Treatment Note   Diagnosis: Stage IIIC1, cT2b N1 M0, Sqamous Cell Carcinoma of the Cervix  Intent: Curative     ==========DELIVERED PLANS==========  First Treatment Date: 2023-05-10 Last Treatment Date: 2023-06-22   Plan Name: Pelvis Site: Pelvis Technique: IMRT Mode: Photon Dose Per Fraction: 1.8 Gy Prescribed Dose (Delivered / Prescribed): 34.2 Gy / 34.2 Gy Prescribed Fxs (Delivered / Prescribed): 19 / 19   Plan Name: Pelvis:1 Site: Pelvis Technique: IMRT Mode: Photon Dose Per Fraction: 1.8 Gy Prescribed Dose (Delivered / Prescribed): 10.8 Gy / 10.8 Gy Prescribed Fxs (Delivered / Prescribed): 6 / 6   Plan Name: Pelvis_Bst Site: Pelvis Technique: 3D Mode: Photon Dose Per Fraction: 1.8 Gy Prescribed Dose (Delivered / Prescribed): 9 Gy / 9 Gy Prescribed Fxs (Delivered / Prescribed): 5 / 5     ==========ON TREATMENT VISIT DATES========== 2023-05-15, 2023-05-22, 2023-05-25, 2023-06-05, 2023-06-12, 2023-06-19    See weekly On Treatment Notes in Epic for details in the Media tab (listed as Progress notes on the On Treatment Visit Dates listed above). The patient tolerated radiation. She developed fatigue and diarrhea during therapy.  The patient will continue with her HDR brachytherapy, and continue follow up with Dr. Alvester Morin as well as Dr. Bertis Ruddy.      Osker Mason, PAC

## 2023-06-27 ENCOUNTER — Other Ambulatory Visit: Payer: Self-pay

## 2023-06-27 ENCOUNTER — Encounter (HOSPITAL_BASED_OUTPATIENT_CLINIC_OR_DEPARTMENT_OTHER): Payer: Self-pay | Admitting: Radiation Oncology

## 2023-06-27 NOTE — H&P (Addendum)
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 female who has recently completed 6 weeks of external beam radiation therapy along with radiosensitizing chemotherapy.  She did experience some issues with diarrhea and some nausea.  She is now ready to proceed with brachytherapy to complete her definitive course of radiation therapy.    PAST MEDICAL HISTORY:  Past Medical History:  Diagnosis Date   Allergy    Ambulates with cane    Arthritis    left knee   Bipolar 1 disorder (HCC)    Follows w/ psychiatry, Dr. Archer Asa.   Cervical cancer (HCC) 2024   Follows w/ [facility], s/p chemo and radiation.   CKD (chronic kidney disease)    stage 3   Colon polyps    COPD (chronic obstructive pulmonary disease) (HCC)    hx of smoking   Depression    GERD (gastroesophageal reflux disease)    Heart murmur    See 11/11/20 echocardiogram in Epic.   History of alcohol abuse    History of chicken pox    History of drug abuse [facility])    Patient states that she quit marijuana in October of 2024.   Hyperlipidemia    Hypertension    hx of, no longer on meds per pt on 06/27/23   Mitral valve regurgitation    See 11/11/20 echo in Epic.   Pneumonia    history of   Pre-diabetes    controlled with diet per patient   Tardive dyskinesia    Patient is being treated with Ingrezza.   Urine incontinence    sometimes at night per pt   Wears partial dentures      PAST SURGICAL HISTORY: Past Surgical History:  Procedure Laterality Date   APPENDECTOMY     BREAST BIOPSY     CHOLECYSTECTOMY     IR IMAGING GUIDED PORT INSERTION  04/30/2023   LUNG BIOPSY     SUBTOTAL COLECTOMY  2005   [facility], [place]    FAMILY HISTORY:  Family History  Problem Relation Age of Onset   Hyperlipidemia Mother    COPD Mother        smoked   Arthritis Father    Heart disease Father    Diabetes Father    Prostate cancer Brother    Heart disease Paternal Grandmother    Heart disease Paternal Uncle    Breast cancer Neg Hx    Ovarian cancer Neg Hx    Colon cancer Neg Hx    Endometrial cancer Neg Hx     SOCIAL HISTORY:  Social History   Tobacco Use   Smoking status: Former    Current packs/day: 0.00    Average packs/day: 1 pack/day for 55.0 years (55.0 ttl pk-yrs)    Types: Cigarettes    Start date: 08/03/1961    Quit date: 08/03/2016    Years since quitting: 6.9   Smokeless tobacco: Never  Vaping Use   Vaping status: Never Used  Substance  Use Topics   Alcohol use: Not Currently    Alcohol/week: 4.0 - 6.0 standard drinks of alcohol    Types: 4 - 6 Cans of beer per week    Comment: Stopped drinking alcohol in October 2024 per patient   Drug use: Not Currently    Frequency: 7.0 times per week    Types: Marijuana    Comment: Stopped in October 2024   Retired Engineer, 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   Current Outpatient Medications  Medication Sig Dispense Refill   aspirin EC 81 MG tablet Take 81 mg by mouth daily.     Cholecalciferol (VITAMIN D3) 2000 units TABS Take by mouth.     diphenoxylate-atropine (LOMOTIL) 2.5-0.025 MG tablet Take 1 tablet by mouth 4 (four) times daily as needed for diarrhea or  loose stools. 60 tablet 0   ezetimibe (ZETIA) 10 MG tablet TAKE 1 TABLET BY MOUTH EVERY DAY 90 tablet 3   famotidine (PEPCID) 20 MG tablet TAKE 1 TABLET BY MOUTH TWICE A DAY 180 tablet 3   FLUoxetine (PROZAC) 10 MG capsule Take 10 mg by mouth daily. Takes 2 one time daily  2   INGREZZA 60 MG capsule Take 60 mg by mouth daily.     lidocaine-prilocaine (EMLA) cream Apply topically as needed. 30 g 3   linaclotide (LINZESS) 145 MCG CAPS capsule Take 1 capsule (145 mcg total) by mouth daily as needed. 30 capsule 11   multivitamin-iron-minerals-folic acid (CENTRUM) chewable tablet Chew 1 tablet by mouth daily.     MYRBETRIQ 50 MG TB24 tablet TAKE 1 TABLET BY MOUTH EVERY DAY 90 tablet 3   Omega-3 Fatty Acids (FISH OIL) 1000 MG CAPS Take 2 capsules by mouth daily.     ondansetron (ZOFRAN) 8 MG tablet Take 1 tablet (8 mg total) by mouth every 8 (eight) hours as needed for nausea. 30 tablet 3   pravastatin (PRAVACHOL) 40 MG tablet TAKE 1 TABLET BY MOUTH EVERY DAY (Patient not taking: Reported on 06/27/2023) 90 tablet 3   prochlorperazine (COMPAZINE) 10 MG tablet Take 1 tablet (10 mg total) by mouth every 6 (six) hours as needed for nausea or vomiting. 30 tablet 3   triamcinolone cream (KENALOG) 0.1 % APPLY TO AFFECTED AREA TWICE A DAY 30 g 1   trimethoprim (TRIMPEX) 100 MG tablet Take 1 tablet (100 mg total) by mouth daily. 90 tablet 3   zolpidem (AMBIEN) 5 MG tablet Take 1 tablet (5 mg total) by mouth at bedtime as needed for sleep. 30 tablet 5    REVIEW OF SYSTEMS:  Notable for that above.    PHYSICAL EXAM:  weight is 160 lb (72.6 kg).   General: Alert and oriented, in no acute distress HEENT: Head is normocephalic. Extraocular movements are intact.  Neck: Neck is supple, no palpable cervical or supraclavicular lymphadenopathy. Heart: Regular in rate and rhythm with no murmurs, rubs, or gallops. Chest: Clear to auscultation bilaterally, with no rhonchi, wheezes, or rales. Abdomen: Soft, nontender,  nondistended, with no rigidity or guarding. Extremities: No cyanosis or edema. Lymphatics: see Neck Exam Skin: No concerning lesions. Musculoskeletal: symmetric strength and muscle tone throughout. Neurologic: Cranial nerves II through XII are grossly intact. No obvious focalities. Speech is fluent. Coordination is  intact. Psychiatric: Judgment and insight are intact. Affect is appropriate.   ECOG = 1  0 - Asymptomatic (Fully active, able to carry on all predisease activities without restriction)  1 - Symptomatic but completely ambulatory (Restricted in physically strenuous activity but ambulatory and able to carry out work of a light or sedentary nature. For example, light housework, office work)  2 - Symptomatic, <50% in bed during the day (Ambulatory and capable of all self care but unable to carry out any work activities. Up and about more than 50% of waking hours)  3 - Symptomatic, >50% in bed, but not bedbound (Capable of only limited self-care, confined to bed or chair 50% or more of waking hours)  4 - Bedbound (Completely disabled. Cannot carry on any self-care. Totally confined to bed or chair)  5 - Death   Santiago Glad MM, Creech RH, Tormey DC, et al. [phone]). "Toxicity and response criteria of the [facility]". Am. Evlyn Clines. Oncol. 5 (6): 649-55  LABORATORY DATA:  Lab Results  Component Value Date   WBC 4.3 06/21/2023   HGB 10.4 (L) 06/21/2023   HCT 30.0 (L) 06/21/2023   MCV 102.7 (H) 06/21/2023   PLT 133 (L) 06/21/2023   NEUTROABS 3.4 06/21/2023   Lab Results  Component Value Date   NA 139 06/21/2023   K 4.6 06/21/2023   CL 108 06/21/2023   CO2 24 06/21/2023   GLUCOSE 103 (H) 06/21/2023   BUN 18 06/21/2023   CREATININE 1.47 (H) 06/21/2023   CALCIUM 9.7 06/21/2023      RADIOGRAPHY: No results found.    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  She is now ready to proceed with brachytherapy treatments.   PLAN: She will be taken to the operating room on January 23 at 10 AM.  She will have exam under anesthesia and placement of brachytherapy equipment in preparation for high-dose-rate radiation therapy.  She is to receive 5.5 Wallace Cullens to the high risk clinical target volume.  Iridium 192 will be the high-dose-rate source.  She is to receive 4 subsequent high-dose-rate treatments to complete her definitive course of therapy for locally advanced cervical cancer.   ------------------------------------------------  Billie Lade, MD   Addendum: patient examined and chart reviewed, questions answered, she is ready for OR procedure. 06/28/2023

## 2023-06-27 NOTE — H&P (View-Only) (Signed)
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 female who has recently completed 6 weeks of external beam radiation therapy along with radiosensitizing chemotherapy.  She did experience some issues with diarrhea and some nausea.  She is now ready to proceed with brachytherapy to complete her definitive course of radiation therapy.    PAST MEDICAL HISTORY:  Past Medical History:  Diagnosis Date   Allergy    Ambulates with cane    Arthritis    left knee   Bipolar 1 disorder (HCC)    Follows w/ psychiatry, Dr. Archer Asa.   Cervical cancer (HCC) 2024   Follows w/ [facility], s/p chemo and radiation.   CKD (chronic kidney disease)    stage 3   Colon polyps    COPD (chronic obstructive pulmonary disease) (HCC)    hx of smoking   Depression    GERD (gastroesophageal reflux disease)    Heart murmur    See 11/11/20 echocardiogram in Epic.   History of alcohol abuse    History of chicken pox    History of drug abuse [facility])    Patient states that she quit marijuana in October of 2024.   Hyperlipidemia    Hypertension    hx of, no longer on meds per pt on 06/27/23   Mitral valve regurgitation    See 11/11/20 echo in Epic.   Pneumonia    history of   Pre-diabetes    controlled with diet per patient   Tardive dyskinesia    Patient is being treated with Ingrezza.   Urine incontinence    sometimes at night per pt   Wears partial dentures      PAST SURGICAL HISTORY: Past Surgical History:  Procedure Laterality Date   APPENDECTOMY     BREAST BIOPSY     CHOLECYSTECTOMY     IR IMAGING GUIDED PORT INSERTION  04/30/2023   LUNG BIOPSY     SUBTOTAL COLECTOMY  2005   [facility], [place]    FAMILY HISTORY:  Family History  Problem Relation Age of Onset   Hyperlipidemia Mother    COPD Mother        smoked   Arthritis Father    Heart disease Father    Diabetes Father    Prostate cancer Brother    Heart disease Paternal Grandmother    Heart disease Paternal Uncle    Breast cancer Neg Hx    Ovarian cancer Neg Hx    Colon cancer Neg Hx    Endometrial cancer Neg Hx     SOCIAL HISTORY:  Social History   Tobacco Use   Smoking status: Former    Current packs/day: 0.00    Average packs/day: 1 pack/day for 55.0 years (55.0 ttl pk-yrs)    Types: Cigarettes    Start date: 08/03/1961    Quit date: 08/03/2016    Years since quitting: 6.9   Smokeless tobacco: Never  Vaping Use   Vaping status: Never Used  Substance  Use Topics   Alcohol use: Not Currently    Alcohol/week: 4.0 - 6.0 standard drinks of alcohol    Types: 4 - 6 Cans of beer per week    Comment: Stopped drinking alcohol in October 2024 per patient   Drug use: Not Currently    Frequency: 7.0 times per week    Types: Marijuana    Comment: Stopped in October 2024   Retired Engineer, 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   Current Outpatient Medications  Medication Sig Dispense Refill   aspirin EC 81 MG tablet Take 81 mg by mouth daily.     Cholecalciferol (VITAMIN D3) 2000 units TABS Take by mouth.     diphenoxylate-atropine (LOMOTIL) 2.5-0.025 MG tablet Take 1 tablet by mouth 4 (four) times daily as needed for diarrhea or  loose stools. 60 tablet 0   ezetimibe (ZETIA) 10 MG tablet TAKE 1 TABLET BY MOUTH EVERY DAY 90 tablet 3   famotidine (PEPCID) 20 MG tablet TAKE 1 TABLET BY MOUTH TWICE A DAY 180 tablet 3   FLUoxetine (PROZAC) 10 MG capsule Take 10 mg by mouth daily. Takes 2 one time daily  2   INGREZZA 60 MG capsule Take 60 mg by mouth daily.     lidocaine-prilocaine (EMLA) cream Apply topically as needed. 30 g 3   linaclotide (LINZESS) 145 MCG CAPS capsule Take 1 capsule (145 mcg total) by mouth daily as needed. 30 capsule 11   multivitamin-iron-minerals-folic acid (CENTRUM) chewable tablet Chew 1 tablet by mouth daily.     MYRBETRIQ 50 MG TB24 tablet TAKE 1 TABLET BY MOUTH EVERY DAY 90 tablet 3   Omega-3 Fatty Acids (FISH OIL) 1000 MG CAPS Take 2 capsules by mouth daily.     ondansetron (ZOFRAN) 8 MG tablet Take 1 tablet (8 mg total) by mouth every 8 (eight) hours as needed for nausea. 30 tablet 3   pravastatin (PRAVACHOL) 40 MG tablet TAKE 1 TABLET BY MOUTH EVERY DAY (Patient not taking: Reported on 06/27/2023) 90 tablet 3   prochlorperazine (COMPAZINE) 10 MG tablet Take 1 tablet (10 mg total) by mouth every 6 (six) hours as needed for nausea or vomiting. 30 tablet 3   triamcinolone cream (KENALOG) 0.1 % APPLY TO AFFECTED AREA TWICE A DAY 30 g 1   trimethoprim (TRIMPEX) 100 MG tablet Take 1 tablet (100 mg total) by mouth daily. 90 tablet 3   zolpidem (AMBIEN) 5 MG tablet Take 1 tablet (5 mg total) by mouth at bedtime as needed for sleep. 30 tablet 5    REVIEW OF SYSTEMS:  Notable for that above.    PHYSICAL EXAM:  weight is 160 lb (72.6 kg).   General: Alert and oriented, in no acute distress HEENT: Head is normocephalic. Extraocular movements are intact.  Neck: Neck is supple, no palpable cervical or supraclavicular lymphadenopathy. Heart: Regular in rate and rhythm with no murmurs, rubs, or gallops. Chest: Clear to auscultation bilaterally, with no rhonchi, wheezes, or rales. Abdomen: Soft, nontender,  nondistended, with no rigidity or guarding. Extremities: No cyanosis or edema. Lymphatics: see Neck Exam Skin: No concerning lesions. Musculoskeletal: symmetric strength and muscle tone throughout. Neurologic: Cranial nerves II through XII are grossly intact. No obvious focalities. Speech is fluent. Coordination is  intact. Psychiatric: Judgment and insight are intact. Affect is appropriate.   ECOG = 1  0 - Asymptomatic (Fully active, able to carry on all predisease activities without restriction)  1 - Symptomatic but completely ambulatory (Restricted in physically strenuous activity but ambulatory and able to carry out work of a light or sedentary nature. For example, light housework, office work)  2 - Symptomatic, <50% in bed during the day (Ambulatory and capable of all self care but unable to carry out any work activities. Up and about more than 50% of waking hours)  3 - Symptomatic, >50% in bed, but not bedbound (Capable of only limited self-care, confined to bed or chair 50% or more of waking hours)  4 - Bedbound (Completely disabled. Cannot carry on any self-care. Totally confined to bed or chair)  5 - Death   Santiago Glad MM, Creech RH, Tormey DC, et al. [phone]). "Toxicity and response criteria of the [facility]". Am. Evlyn Clines. Oncol. 5 (6): 649-55  LABORATORY DATA:  Lab Results  Component Value Date   WBC 4.3 06/21/2023   HGB 10.4 (L) 06/21/2023   HCT 30.0 (L) 06/21/2023   MCV 102.7 (H) 06/21/2023   PLT 133 (L) 06/21/2023   NEUTROABS 3.4 06/21/2023   Lab Results  Component Value Date   NA 139 06/21/2023   K 4.6 06/21/2023   CL 108 06/21/2023   CO2 24 06/21/2023   GLUCOSE 103 (H) 06/21/2023   BUN 18 06/21/2023   CREATININE 1.47 (H) 06/21/2023   CALCIUM 9.7 06/21/2023      RADIOGRAPHY: No results found.    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  She is now ready to proceed with brachytherapy treatments.   PLAN: She will be taken to the operating room on January 23 at 10 AM.  She will have exam under anesthesia and placement of brachytherapy equipment in preparation for high-dose-rate radiation therapy.  She is to receive 5.5 Wallace Cullens to the high risk clinical target volume.  Iridium 192 will be the high-dose-rate source.  She is to receive 4 subsequent high-dose-rate treatments to complete her definitive course of therapy for locally advanced cervical cancer.   ------------------------------------------------  Billie Lade, MD   Addendum: patient examined and chart reviewed, questions answered, she is ready for OR procedure. 06/28/2023

## 2023-06-27 NOTE — Progress Notes (Signed)
Spoke w/ via phone for pre-op interview---[facility] Lab needs dos----  none       Lab results------06/21/23 cbc, cmp in Epic, 11/11/20 echo in Epic COVID test -----patient states asymptomatic no test needed Arrive at -------0800 on Thursday, 06/28/23 NPO after MN NO Solid Food.  Clear liquids from MN until---0700 Med rec completed Medications to take morning of surgery -----Zetia, Prozac, Ingrezza, Pepcid, Myrbetriq, Trimpex, Zofran prn, Compazine prn, Lomotil prn Diabetic medication -----nA Patient instructed no nail polish to be worn day of surgery Patient instructed to bring photo id and insurance card day of surgery Patient aware to have Driver (ride ) / caregiver    for 24 hours after surgery - son, Alycia Rossetti Patient Special Instructions -----Partial dentures must be removed prior to OR. Pre-Op special Instructions -----Patient is aware that she will be going to the [facility] after recovery. Patient verbalized understanding of instructions that were given at this phone interview. Patient denies chest pain, sob, fever, cough at the interview.

## 2023-06-28 ENCOUNTER — Ambulatory Visit
Admission: RE | Admit: 2023-06-28 | Discharge: 2023-06-28 | Disposition: A | Discharge status: Home / Self Care | Payer: Medicare Other | Source: Ambulatory Visit | Attending: Radiation Oncology | Admitting: Radiation Oncology

## 2023-06-28 ENCOUNTER — Other Ambulatory Visit: Payer: Self-pay

## 2023-06-28 ENCOUNTER — Ambulatory Visit (HOSPITAL_COMMUNITY)
Admission: RE | Admit: 2023-06-28 | Discharge: 2023-06-28 | Disposition: A | Discharge status: Home / Self Care | Payer: Medicare Other | Source: Ambulatory Visit | Attending: Radiation Oncology | Admitting: Radiation Oncology

## 2023-06-28 ENCOUNTER — Encounter (HOSPITAL_BASED_OUTPATIENT_CLINIC_OR_DEPARTMENT_OTHER)
Admission: RE | Disposition: A | Discharge status: Other Acute Inpatient Hospital | Payer: Self-pay | Source: Home / Self Care | Attending: Radiation Oncology

## 2023-06-28 ENCOUNTER — Ambulatory Visit (HOSPITAL_BASED_OUTPATIENT_CLINIC_OR_DEPARTMENT_OTHER): Payer: Medicare Other | Admitting: Anesthesiology

## 2023-06-28 ENCOUNTER — Ambulatory Visit (HOSPITAL_BASED_OUTPATIENT_CLINIC_OR_DEPARTMENT_OTHER)
Admission: RE | Admit: 2023-06-28 | Discharge: 2023-06-28 | Disposition: A | Discharge status: Other Acute Inpatient Hospital | Payer: Medicare Other | Attending: Radiation Oncology | Admitting: Radiation Oncology

## 2023-06-28 ENCOUNTER — Other Ambulatory Visit (HOSPITAL_COMMUNITY): Payer: Self-pay | Admitting: Radiation Oncology

## 2023-06-28 ENCOUNTER — Ambulatory Visit
Admission: RE | Admit: 2023-06-28 | Discharge: 2023-06-28 | Disposition: A | Discharge status: Home / Self Care | Payer: Medicare Other | Source: Ambulatory Visit | Attending: Internal Medicine | Admitting: Internal Medicine

## 2023-06-28 ENCOUNTER — Encounter (HOSPITAL_BASED_OUTPATIENT_CLINIC_OR_DEPARTMENT_OTHER): Payer: Self-pay | Admitting: Radiation Oncology

## 2023-06-28 VITALS — BP 151/71 | HR 70 | Resp 18

## 2023-06-28 DIAGNOSIS — Z8261 Family history of arthritis: Secondary | ICD-10-CM | POA: Insufficient documentation

## 2023-06-28 DIAGNOSIS — Z923 Personal history of irradiation: Secondary | ICD-10-CM | POA: Diagnosis not present

## 2023-06-28 DIAGNOSIS — F319 Bipolar disorder, unspecified: Secondary | ICD-10-CM | POA: Insufficient documentation

## 2023-06-28 DIAGNOSIS — K219 Gastro-esophageal reflux disease without esophagitis: Secondary | ICD-10-CM | POA: Insufficient documentation

## 2023-06-28 DIAGNOSIS — Z8541 Personal history of malignant neoplasm of cervix uteri: Secondary | ICD-10-CM

## 2023-06-28 DIAGNOSIS — Z87891 Personal history of nicotine dependence: Secondary | ICD-10-CM

## 2023-06-28 DIAGNOSIS — Z9221 Personal history of antineoplastic chemotherapy: Secondary | ICD-10-CM | POA: Diagnosis not present

## 2023-06-28 DIAGNOSIS — D61818 Other pancytopenia: Secondary | ICD-10-CM | POA: Diagnosis not present

## 2023-06-28 DIAGNOSIS — J449 Chronic obstructive pulmonary disease, unspecified: Secondary | ICD-10-CM | POA: Insufficient documentation

## 2023-06-28 DIAGNOSIS — I129 Hypertensive chronic kidney disease with stage 1 through stage 4 chronic kidney disease, or unspecified chronic kidney disease: Secondary | ICD-10-CM

## 2023-06-28 DIAGNOSIS — Z5111 Encounter for antineoplastic chemotherapy: Secondary | ICD-10-CM | POA: Diagnosis not present

## 2023-06-28 DIAGNOSIS — M1990 Unspecified osteoarthritis, unspecified site: Secondary | ICD-10-CM | POA: Diagnosis not present

## 2023-06-28 DIAGNOSIS — Z8249 Family history of ischemic heart disease and other diseases of the circulatory system: Secondary | ICD-10-CM | POA: Insufficient documentation

## 2023-06-28 DIAGNOSIS — Z79899 Other long term (current) drug therapy: Secondary | ICD-10-CM | POA: Diagnosis not present

## 2023-06-28 DIAGNOSIS — C538 Malignant neoplasm of overlapping sites of cervix uteri: Secondary | ICD-10-CM

## 2023-06-28 DIAGNOSIS — N183 Chronic kidney disease, stage 3 unspecified: Secondary | ICD-10-CM

## 2023-06-28 DIAGNOSIS — Z01818 Encounter for other preprocedural examination: Secondary | ICD-10-CM

## 2023-06-28 DIAGNOSIS — C539 Malignant neoplasm of cervix uteri, unspecified: Secondary | ICD-10-CM | POA: Insufficient documentation

## 2023-06-28 DIAGNOSIS — Z7982 Long term (current) use of aspirin: Secondary | ICD-10-CM | POA: Diagnosis not present

## 2023-06-28 DIAGNOSIS — Z510 Encounter for antineoplastic radiation therapy: Secondary | ICD-10-CM | POA: Diagnosis not present

## 2023-06-28 HISTORY — DX: Pneumonia, unspecified organism: J18.9

## 2023-06-28 HISTORY — DX: Cardiac murmur, unspecified: R01.1

## 2023-06-28 HISTORY — DX: Dependence on other enabling machines and devices: Z99.89

## 2023-06-28 HISTORY — DX: Drug induced subacute dyskinesia: G24.01

## 2023-06-28 HISTORY — DX: Prediabetes: R73.03

## 2023-06-28 HISTORY — PX: OPERATIVE ULTRASOUND: SHX5996

## 2023-06-28 HISTORY — DX: Presence of dental prosthetic device (complete) (partial): Z97.2

## 2023-06-28 HISTORY — PX: TANDEM RING INSERTION: SHX6199

## 2023-06-28 LAB — RAD ONC ARIA SESSION SUMMARY
Course Elapsed Days: 49
Plan Fractions Treated to Date: 1
Plan Prescribed Dose Per Fraction: 5.5 Gy
Plan Total Fractions Prescribed: 1
Plan Total Prescribed Dose: 5.5 Gy
Reference Point Dosage Given to Date: 3.7512 Gy
Reference Point Dosage Given to Date: 4.0869 Gy
Reference Point Dosage Given to Date: 5.5 Gy
Reference Point Dosage Given to Date: 8.3622 Gy
Reference Point Dosage Given to Date: 9.4371 Gy
Reference Point Session Dosage Given: 3.7512 Gy
Reference Point Session Dosage Given: 4.0869 Gy
Reference Point Session Dosage Given: 5.5 Gy
Reference Point Session Dosage Given: 8.3622 Gy
Reference Point Session Dosage Given: 9.4371 Gy
Session Number: 31

## 2023-06-28 SURGERY — INSERTION, UTERINE TANDEM AND RING OR CYLINDER, FOR BRACHYTHERAPY
Anesthesia: General | Site: Uterus

## 2023-06-28 MED ORDER — FENTANYL CITRATE (PF) 100 MCG/2ML IJ SOLN
INTRAMUSCULAR | Status: AC
  Filled 2023-06-28: qty 2

## 2023-06-28 MED ORDER — SODIUM CHLORIDE 0.9 % IV SOLN
INTRAVENOUS | Status: DC
  Administered 2023-06-28: 1000 mL via INTRAVENOUS

## 2023-06-28 MED ORDER — EPHEDRINE SULFATE-NACL 50-0.9 MG/10ML-% IV SOSY
PREFILLED_SYRINGE | INTRAVENOUS | Status: DC | PRN
  Administered 2023-06-28: 5 mg via INTRAVENOUS

## 2023-06-28 MED ORDER — ONDANSETRON HCL 4 MG/2ML IJ SOLN
INTRAMUSCULAR | Status: DC | PRN
  Administered 2023-06-28: 4 mg via INTRAVENOUS

## 2023-06-28 MED ORDER — OXYCODONE-ACETAMINOPHEN 5-325 MG PO TABS: 1.0000 | ORAL_TABLET | Freq: Four times a day (QID) | ORAL | 0 refills | Status: DC | PRN

## 2023-06-28 MED ORDER — HYDROMORPHONE HCL 1 MG/ML IJ SOLN
0.5000 mg | Freq: Once | INTRAMUSCULAR | Status: AC
  Administered 2023-06-28: 0.5 mg via INTRAVENOUS

## 2023-06-28 MED ORDER — LIDOCAINE 2% (20 MG/ML) 5 ML SYRINGE
INTRAMUSCULAR | Status: DC | PRN
  Administered 2023-06-28: 60 mg via INTRAVENOUS

## 2023-06-28 MED ORDER — ACETAMINOPHEN 500 MG PO TABS
ORAL_TABLET | ORAL | Status: AC
  Filled 2023-06-28: qty 2

## 2023-06-28 MED ORDER — HYDROMORPHONE HCL 1 MG/ML IJ SOLN
INTRAMUSCULAR | Status: AC
  Filled 2023-06-28: qty 1

## 2023-06-28 MED ORDER — FENTANYL CITRATE (PF) 250 MCG/5ML IJ SOLN
INTRAMUSCULAR | Status: DC | PRN
  Administered 2023-06-28 (×2): 25 ug via INTRAVENOUS
  Administered 2023-06-28: 50 ug via INTRAVENOUS

## 2023-06-28 MED ORDER — ESTRADIOL 0.1 MG/GM VA CREA
TOPICAL_CREAM | VAGINAL | Status: DC | PRN
  Administered 2023-06-28: 1 via VAGINAL

## 2023-06-28 MED ORDER — ACETAMINOPHEN 500 MG PO TABS
1000.0000 mg | ORAL_TABLET | Freq: Once | ORAL | Status: AC
  Administered 2023-06-28: 1000 mg via ORAL

## 2023-06-28 MED ORDER — ESTRADIOL 0.1 MG/GM VA CREA
TOPICAL_CREAM | VAGINAL | Status: AC
  Filled 2023-06-28: qty 42.5

## 2023-06-28 MED ORDER — FENTANYL CITRATE (PF) 100 MCG/2ML IJ SOLN
25.0000 ug | INTRAMUSCULAR | Status: DC | PRN
  Administered 2023-06-28: 25 ug via INTRAVENOUS

## 2023-06-28 MED ORDER — SODIUM CHLORIDE 0.9 % IV SOLN
INTRAVENOUS | Status: DC
  Filled 2023-06-28 (×3): qty 250

## 2023-06-28 MED ORDER — DEXAMETHASONE SODIUM PHOSPHATE 10 MG/ML IJ SOLN
INTRAMUSCULAR | Status: DC | PRN
  Administered 2023-06-28: 5 mg via INTRAVENOUS

## 2023-06-28 MED ORDER — PROPOFOL 10 MG/ML IV BOLUS
INTRAVENOUS | Status: DC | PRN
  Administered 2023-06-28: 130 mg via INTRAVENOUS

## 2023-06-28 SURGICAL SUPPLY — 14 items
GAUZE PAD ABD 8X10 STRL (GAUZE/BANDAGES/DRESSINGS) ×1 IMPLANT
GLOVE BIO SURGEON STRL SZ7.5 (GLOVE) ×2 IMPLANT
GOWN STRL REUS W/TWL LRG LVL3 (GOWN DISPOSABLE) ×1 IMPLANT
HOLDER FOLEY CATH W/STRAP (MISCELLANEOUS) ×1 IMPLANT
IV SET ADMIN PUMP GEMINI W/NLD (IV SETS) ×1 IMPLANT
KIT TURNOVER CYSTO (KITS) ×1 IMPLANT
MAT PREVALON FULL STRYKER (MISCELLANEOUS) ×1 IMPLANT
PACK VAGINAL MINOR WOMEN LF (CUSTOM PROCEDURE TRAY) ×1 IMPLANT
PACKING VAGINAL (PACKING) IMPLANT
SET IRRIG Y TYPE TUR BLADDER L (SET/KITS/TRAYS/PACK) IMPLANT
SLEEVE SCD COMPRESS KNEE MED (STOCKING) ×1 IMPLANT
TOWEL OR 17X24 6PK STRL BLUE (TOWEL DISPOSABLE) ×1 IMPLANT
TRAY FOLEY W/BAG SLVR 14FR LF (SET/KITS/TRAYS/PACK) ×1 IMPLANT
WATER STERILE IRR 500ML POUR (IV SOLUTION) IMPLANT

## 2023-06-28 NOTE — Transfer of Care (Signed)
Immediate Anesthesia Transfer of Care Note  Patient: Paula [name]  Procedure(s) Performed: TANDEM RING INSERTION (Uterus) OPERATIVE ULTRASOUND (Uterus)  Patient Location: PACU  Anesthesia Type:General  Level of Consciousness: sedated  Airway & Oxygen Therapy: Patient Spontanous Breathing  Post-op Assessment: Report given to RN and Post -op Vital signs reviewed and stable  Post vital signs: Reviewed and stable  Last Vitals:  Vitals Value Taken Time  BP 132/105 06/28/23 1131  Temp 36.6 C 06/28/23 1131  Pulse 79 06/28/23 1132  Resp 16 06/28/23 1132  SpO2 95 % 06/28/23 1132  Vitals shown include unfiled device data.  Last Pain:  Vitals:   06/28/23 0827  TempSrc:   PainSc: 0-No pain      Patients Stated Pain Goal: 6 (06/28/23 0827)  Complications: No notable events documented.

## 2023-06-28 NOTE — Anesthesia Postprocedure Evaluation (Signed)
Anesthesia Post Note  Patient: Paula [name]  Procedure(s) Performed: TANDEM RING INSERTION (Uterus) OPERATIVE ULTRASOUND (Uterus)     Patient location during evaluation: PACU Anesthesia Type: General Level of consciousness: awake and alert Pain management: pain level controlled Vital Signs Assessment: post-procedure vital signs reviewed and stable Respiratory status: spontaneous breathing, nonlabored ventilation and respiratory function stable Cardiovascular status: blood pressure returned to baseline and stable Postop Assessment: no apparent nausea or vomiting Anesthetic complications: no  No notable events documented.  Last Vitals:  Vitals:   06/28/23 1200 06/28/23 1215  BP: (!) 147/68 (!) 152/77  Pulse: 70 70  Resp: 19 13  Temp:    SpO2: 92% 95%    Last Pain:  Vitals:   06/28/23 1209  TempSrc:   PainSc: 3                  Amerie Beaumont,W. EDMOND

## 2023-06-28 NOTE — Anesthesia Preprocedure Evaluation (Addendum)
Anesthesia Evaluation  Patient identified by MRN, date of birth, ID band Patient awake    Reviewed: Allergy & Precautions, H&P , NPO status , Patient's Chart, lab work & pertinent test results  Airway Mallampati: III  TM Distance: >3 FB Neck ROM: Full    Dental no notable dental hx. (+) Teeth Intact, Dental Advisory Given   Pulmonary COPD, former smoker   Pulmonary exam normal breath sounds clear to auscultation       Cardiovascular hypertension, Pt. on medications  Rhythm:Regular Rate:Normal     Neuro/Psych    Depression Bipolar Disorder   negative neurological ROS     GI/Hepatic Neg liver ROS,GERD  Medicated,,  Endo/Other  negative endocrine ROS    Renal/GU Renal InsufficiencyRenal disease  negative genitourinary   Musculoskeletal  (+) Arthritis ,    Abdominal   Peds  Hematology  (+) Blood dyscrasia, anemia   Anesthesia Other Findings   Reproductive/Obstetrics negative OB ROS                             Anesthesia Physical Anesthesia Plan  ASA: 3  Anesthesia Plan: General   Post-op Pain Management: Tylenol PO (pre-op)*   Induction: Intravenous  PONV Risk Score and Plan: 4 or greater and Ondansetron, Dexamethasone and Treatment may vary due to age or medical condition  Airway Management Planned: LMA  Additional Equipment:   Intra-op Plan:   Post-operative Plan: Extubation in OR  Informed Consent: I have reviewed the patients History and Physical, chart, labs and discussed the procedure including the risks, benefits and alternatives for the proposed anesthesia with the patient or authorized representative who has indicated his/her understanding and acceptance.     Dental advisory given  Plan Discussed with: CRNA  Anesthesia Plan Comments:        Anesthesia Quick Evaluation

## 2023-06-28 NOTE — Op Note (Signed)
06/28/2023  12:51 PM  PATIENT:  Paula [name]  78 y.o. female  PRE-OPERATIVE DIAGNOSIS:  cervical cancer  POST-OPERATIVE DIAGNOSIS:  cervical cancer  PROCEDURE:  Procedure(s): TANDEM RING INSERTION (N/A) OPERATIVE ULTRASOUND (N/A)  SURGEON:  Surgeons and Role:    Antony Blackbird, MD - Primary  PHYSICIAN ASSISTANT:   ASSISTANTS: none   ANESTHESIA:   general  EBL: none  BLOOD ADMINISTERED:none  DRAINS: Urinary Catheter (Foley)   LOCAL MEDICATIONS USED:  NONE  SPECIMEN:  No Specimen  DISPOSITION OF SPECIMEN:  N/A  COUNTS:  YES  TOURNIQUET:  * No tourniquets in log *  DICTATION: The patient was prepped and draped in the usual sterile fashion and placed in the dorsolithotomy position. timeout for the procedure as well as preoperative medications allergies was performed.  The Foley catheter was placed and backfilled with approximately 250 cc of saline for transabdominal ultrasound imaging.  The patient proceeded to undergo transabdominal ultrasound measurements of the cervix.  The width was estimated to be 3.3 cm the height was 2.4 cm and the length was 2.7 cm.  Exam under anesthesia was performed.  The cervical mass was estimated to be approximately 3 x 3-1/2 cm in size.  There is some thickening along the right parametrial area but no obvious involvement or nodularity.  Ultrasound guidance the uterus was sounded and estimated to be approximately 5.8 cm in length.  The uterus was moderately anteverted.  After sounding the cervical os was dilated.  The patient then had placement of a 60 degree, 40 mm tandem with a cervical sleeve in place.  The patient then had placement of a 60 degree cervical ring.  Shielding cap could not be placed due to the narrow vaginal introitus.  A urinary kit was obtained and a rubber ring was placed along the cervical ring to limit high-dose radiation therapy to the mucosal tissue.  An attempt to place the rectal paddle was unsuccessful due to the  narrowed vaginal orifice She then had vaginal packing soaked in Estrace cream placed within the vaginal vault to  shield  the rectum and bladder.  Patient tolerated the procedure well.  She will subsequently be transported to radiation oncology for planning and her first high-dose-rate brachytherapy procedure.  She is to receive 5.5 Wallace Cullens to the high risk clinical target volume.  PLAN OF CARE:  Transfer to radiation oncology for planning and treatment  PATIENT DISPOSITION:  PACU - hemodynamically stable.   Delay start of Pharmacological VTE agent (>24hrs) due to surgical blood loss or risk of bleeding: not applicable

## 2023-06-28 NOTE — Patient Instructions (Signed)
IMMEDIATELY FOLLOWING SURGERY: Do not drive or operate machinery for the first twenty four hours after surgery. Do not make any important decisions for twenty four hours after surgery or while taking narcotic pain medications or sedatives. If you develop intractable nausea and vomiting or a severe headache please notify your doctor immediately.   FOLLOW-UP: You do not need to follow up with anesthesia unless specifically instructed to do so.   WOUND CARE INSTRUCTIONS (if applicable): Expect some mild vaginal bleeding, but if large amount of bleeding occurs please contact Dr. Roselind Messier at [phone] or the Radiation On-Call physician. Call for any fever greater than 101.0 degrees or increasing vaginal//abdominal pain or trouble urinating.   QUESTIONS?: Please feel free to call your physician or the hospital operator if you have any questions, and they will be happy to assist you. Resume all medications: as listed on your after visit summary. Your next appointment is:  Future Appointments  Date Time Provider Department Center  07/04/2023  7:00 AM [facility] 1 [facility] [place]  07/04/2023  9:30 AM Antony Blackbird, MD [facility] None  07/04/2023  2:00 PM Antony Blackbird, MD [facility] None  07/09/2023 11:00 AM [facility] 1 [facility] [place]  07/09/2023  1:30 PM Antony Blackbird, MD [facility] None  07/09/2023  3:30 PM Antony Blackbird, MD [facility] None  07/18/2023  8:00 AM [facility] 1 [facility] [place]  07/18/2023 11:00 AM Antony Blackbird, MD [facility] None  07/18/2023  2:00 PM Antony Blackbird, MD [facility] None  08/14/2023 11:00 AM [facility] None  08/14/2023 12:00 PM Artis Delay, MD [facility] None

## 2023-06-28 NOTE — Anesthesia Procedure Notes (Signed)
Procedure Name: LMA Insertion Date/Time: 06/28/2023 10:25 AM  Performed by: Dairl Ponder, CRNAPre-anesthesia Checklist: Patient identified, Emergency Drugs available, Suction available and Patient being monitored Patient Re-evaluated:Patient Re-evaluated prior to induction Oxygen Delivery Method: Circle System Utilized Preoxygenation: Pre-oxygenation with 100% oxygen Induction Type: IV induction Ventilation: Mask ventilation without difficulty LMA: LMA inserted LMA Size: 4.0 Number of attempts: 1 Airway Equipment and Method: Bite block Placement Confirmation: positive ETCO2 Tube secured with: Tape Dental Injury: Teeth and Oropharynx as per pre-operative assessment

## 2023-06-28 NOTE — Brief Op Note (Signed)
06/28/2023  11:41 AM  PATIENT:  Paula [name]  78 y.o. female  PRE-OPERATIVE DIAGNOSIS:  cervical cancer  POST-OPERATIVE DIAGNOSIS:  cervical cancer  PROCEDURE:  Procedure(s): TANDEM RING INSERTION (N/A) OPERATIVE ULTRASOUND (N/A)  SURGEON:  Surgeons and Role:    Antony Blackbird, MD - Primary  PHYSICIAN ASSISTANT:   ASSISTANTS: none   ANESTHESIA:   general  EBL:  none  BLOOD ADMINISTERED:none  DRAINS: Urinary Catheter (Foley)   LOCAL MEDICATIONS USED:  NONE  SPECIMEN:  No Specimen  DISPOSITION OF SPECIMEN:  N/A  COUNTS:  YES  TOURNIQUET:  * No tourniquets in log *  DICTATION: dragon dictation system not available  PLAN OF CARE:  transfer to radiation oncology for planning and treatment  PATIENT DISPOSITION:  PACU - hemodynamically stable.   Delay start of Pharmacological VTE agent (>24hrs) due to surgical blood loss or risk of bleeding: not applicable

## 2023-06-29 ENCOUNTER — Encounter (HOSPITAL_BASED_OUTPATIENT_CLINIC_OR_DEPARTMENT_OTHER): Payer: Self-pay | Admitting: Radiation Oncology

## 2023-07-02 ENCOUNTER — Encounter (HOSPITAL_BASED_OUTPATIENT_CLINIC_OR_DEPARTMENT_OTHER): Payer: Self-pay | Admitting: Radiation Oncology

## 2023-07-02 NOTE — Progress Notes (Signed)
Spoke w/ via phone for pre-op interview--- Zella Ball Lab needs dos----  NONE       Lab results------ COVID test -----patient states asymptomatic no test needed Arrive at -------0530 NPO after MN NO Solid Food.   Med rec completed Medications to take morning of surgery -----Prozac, Prilosec, Ingrezza, Myrbetriq, Trimpex, Percocet and nausea medicatons PRN.  Diabetic medication ----- Patient instructed no nail polish to be worn day of surgery Patient instructed to bring photo id and insurance card day of surgery Patient aware to have Driver (ride ) / caregiver    for 24 hours after surgery - Son Mardelle Matte Patient Special Instructions ----- Pre-Op special Instructions ----- Patient verbalized understanding of instructions that were given at this phone interview. Patient denies chest pain, sob, fever, cough at the interview.

## 2023-07-03 NOTE — Progress Notes (Signed)
  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 Identity was confirmed. All relevant records and images related to the planned course of therapy were reviewed. The patient freely provided informed written consent to proceed with treatment after reviewing the details related to the planned course of therapy. The consent form was witnessed and verified by the simulation staff. Then, the patient was set-up in a stable reproducible supine position for radiation therapy. The tandem ring system was accessed and fiducial markers were placed within the tandem and ring.   Simple treatment device note: While in the operating room the patient had construction of her custom tandem ring system. She will be treated with a 60 tandem/ring system. The patient had placement of a 60 mm tandem. A cervical ring with cysto-tubing was used for her treatment. A rectal paddle could not be placed due to patient's anatomy so vaginal packing soaked in Estrace cream was placed.  Verification simulation note: An AP and lateral film was obtained through the pelvis area. This was compared to the patient's planning films documenting accurate position of the tandem/ring system for treatment.  High-dose-rate brachytherapy treatment note:   The remote afterloading device was accessed through catheter system and attached to the tandem ring system. Patient then proceeded to  undergo her second high-dose-rate treatment directed at the cervix. The patient was prescribed a dose of 5.5 gray to be delivered to the HRCTV.Marland Kitchen Patient was treated with 2 channels using 20 dwell positions. Treatment time was 372.9 seconds. The patient tolerated the procedure well. After completion of her therapy, a radiation survey was performed documenting return of the iridium source into the GammaMed safe. The patient was then transferred to the nursing suite.  She then had removal of the vaginal packing followed by removal of the tandem ring system.  PLAN: The patient will return next week for her third HDR treatment.  ________________________________   Billie Lade, PhD, MD  This document serves as a record of services personally performed by Antony Blackbird, MD. It was created on his behalf by Herbie Saxon, a trained medical scribe. The creation of this record is based on the scribe's personal observations and the provider's statements to them. This document has been checked and approved by the attending provider.

## 2023-07-03 NOTE — H&P (View-Only) (Signed)
  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 Identity was confirmed. All relevant records and images related to the planned course of therapy were reviewed. The patient freely provided informed written consent to proceed with treatment after reviewing the details related to the planned course of therapy. The consent form was witnessed and verified by the simulation staff. Then, the patient was set-up in a stable reproducible supine position for radiation therapy. The tandem ring system was accessed and fiducial markers were placed within the tandem and ring.   Simple treatment device note: While in the operating room the patient had construction of her custom tandem ring system. She will be treated with a 60 tandem/ring system. The patient had placement of a 60 mm tandem. A cervical ring with cysto-tubing was used for her treatment. A rectal paddle could not be placed due to patient's anatomy so vaginal packing soaked in Estrace cream was placed.  Verification simulation note: An AP and lateral film was obtained through the pelvis area. This was compared to the patient's planning films documenting accurate position of the tandem/ring system for treatment.  High-dose-rate brachytherapy treatment note:   The remote afterloading device was accessed through catheter system and attached to the tandem ring system. Patient then proceeded to  undergo her second high-dose-rate treatment directed at the cervix. The patient was prescribed a dose of 5.5 gray to be delivered to the HRCTV.Marland Kitchen Patient was treated with 2 channels using 20 dwell positions. Treatment time was 372.9 seconds. The patient tolerated the procedure well. After completion of her therapy, a radiation survey was performed documenting return of the iridium source into the GammaMed safe. The patient was then transferred to the nursing suite.  She then had removal of the vaginal packing followed by removal of the tandem ring system.  PLAN: The patient will return next week for her third HDR treatment.  ________________________________   Billie Lade, PhD, MD  This document serves as a record of services personally performed by Antony Blackbird, MD. It was created on his behalf by Herbie Saxon, a trained medical scribe. The creation of this record is based on the scribe's personal observations and the provider's statements to them. This document has been checked and approved by the attending provider.

## 2023-07-03 NOTE — Anesthesia Preprocedure Evaluation (Signed)
Anesthesia Evaluation  Patient identified by MRN, date of birth, ID band Patient awake    Reviewed: Allergy & Precautions, H&P , NPO status , Patient's Chart, lab work & pertinent test results  Airway Mallampati: III  TM Distance: >3 FB Neck ROM: Full    Dental no notable dental hx. (+) Teeth Intact, Dental Advisory Given, Missing,    Pulmonary COPD, former smoker   Pulmonary exam normal breath sounds clear to auscultation       Cardiovascular hypertension, Pt. on medications  Rhythm:Regular Rate:Normal     Neuro/Psych  PSYCHIATRIC DISORDERS  Depression Bipolar Disorder   negative neurological ROS     GI/Hepatic ,GERD  Medicated,,(+)     substance abuse  alcohol use  Endo/Other  negative endocrine ROS    Renal/GU Renal InsufficiencyRenal disease  negative genitourinary   Musculoskeletal  (+) Arthritis , Osteoarthritis,    Abdominal   Peds  Hematology  (+) Blood dyscrasia, anemia   Anesthesia Other Findings   Reproductive/Obstetrics negative OB ROS                             Anesthesia Physical Anesthesia Plan  ASA: 3  Anesthesia Plan: General   Post-op Pain Management: Tylenol PO (pre-op)*   Induction: Intravenous  PONV Risk Score and Plan: 4 or greater and Ondansetron, Dexamethasone and Treatment may vary due to age or medical condition  Airway Management Planned: LMA  Additional Equipment: None  Intra-op Plan:   Post-operative Plan: Extubation in OR  Informed Consent: I have reviewed the patients History and Physical, chart, labs and discussed the procedure including the risks, benefits and alternatives for the proposed anesthesia with the patient or authorized representative who has indicated his/her understanding and acceptance.     Dental advisory given  Plan Discussed with: CRNA and Anesthesiologist  Anesthesia Plan Comments:        Anesthesia Quick  Evaluation

## 2023-07-04 ENCOUNTER — Encounter (HOSPITAL_BASED_OUTPATIENT_CLINIC_OR_DEPARTMENT_OTHER): Payer: Self-pay | Admitting: Radiation Oncology

## 2023-07-04 ENCOUNTER — Ambulatory Visit (HOSPITAL_BASED_OUTPATIENT_CLINIC_OR_DEPARTMENT_OTHER)
Admission: RE | Admit: 2023-07-04 | Discharge: 2023-07-04 | Disposition: A | Discharge status: Other Acute Inpatient Hospital | Payer: Medicare Other | Attending: Radiation Oncology | Admitting: Radiation Oncology

## 2023-07-04 ENCOUNTER — Other Ambulatory Visit: Payer: Self-pay

## 2023-07-04 ENCOUNTER — Ambulatory Visit (HOSPITAL_COMMUNITY)
Admission: RE | Admit: 2023-07-04 | Discharge: 2023-07-04 | Disposition: A | Discharge status: Home / Self Care | Payer: Medicare Other | Source: Ambulatory Visit | Attending: Radiation Oncology | Admitting: Radiation Oncology

## 2023-07-04 ENCOUNTER — Ambulatory Visit (HOSPITAL_BASED_OUTPATIENT_CLINIC_OR_DEPARTMENT_OTHER): Payer: Medicare Other | Admitting: Anesthesiology

## 2023-07-04 ENCOUNTER — Ambulatory Visit
Admission: RE | Admit: 2023-07-04 | Discharge: 2023-07-04 | Disposition: A | Discharge status: Home / Self Care | Payer: Medicare Other | Source: Ambulatory Visit | Attending: Internal Medicine | Admitting: Internal Medicine

## 2023-07-04 ENCOUNTER — Ambulatory Visit
Admission: RE | Admit: 2023-07-04 | Discharge: 2023-07-04 | Disposition: A | Discharge status: Home / Self Care | Payer: Medicare Other | Source: Ambulatory Visit | Attending: Radiation Oncology | Admitting: Radiation Oncology

## 2023-07-04 ENCOUNTER — Encounter (HOSPITAL_BASED_OUTPATIENT_CLINIC_OR_DEPARTMENT_OTHER)
Admission: RE | Disposition: A | Discharge status: Other Acute Inpatient Hospital | Payer: Self-pay | Source: Home / Self Care | Attending: Radiation Oncology

## 2023-07-04 VITALS — BP 122/62 | HR 75 | Resp 18

## 2023-07-04 DIAGNOSIS — Z87891 Personal history of nicotine dependence: Secondary | ICD-10-CM | POA: Insufficient documentation

## 2023-07-04 DIAGNOSIS — C538 Malignant neoplasm of overlapping sites of cervix uteri: Secondary | ICD-10-CM | POA: Insufficient documentation

## 2023-07-04 DIAGNOSIS — C539 Malignant neoplasm of cervix uteri, unspecified: Secondary | ICD-10-CM | POA: Insufficient documentation

## 2023-07-04 DIAGNOSIS — I129 Hypertensive chronic kidney disease with stage 1 through stage 4 chronic kidney disease, or unspecified chronic kidney disease: Secondary | ICD-10-CM | POA: Diagnosis not present

## 2023-07-04 DIAGNOSIS — K219 Gastro-esophageal reflux disease without esophagitis: Secondary | ICD-10-CM | POA: Diagnosis not present

## 2023-07-04 DIAGNOSIS — N189 Chronic kidney disease, unspecified: Secondary | ICD-10-CM | POA: Diagnosis not present

## 2023-07-04 DIAGNOSIS — Z8541 Personal history of malignant neoplasm of cervix uteri: Secondary | ICD-10-CM

## 2023-07-04 DIAGNOSIS — C530 Malignant neoplasm of endocervix: Secondary | ICD-10-CM | POA: Insufficient documentation

## 2023-07-04 DIAGNOSIS — N183 Chronic kidney disease, stage 3 unspecified: Secondary | ICD-10-CM | POA: Diagnosis not present

## 2023-07-04 DIAGNOSIS — J449 Chronic obstructive pulmonary disease, unspecified: Secondary | ICD-10-CM | POA: Diagnosis not present

## 2023-07-04 HISTORY — PX: OPERATIVE ULTRASOUND: SHX5996

## 2023-07-04 HISTORY — PX: TANDEM RING INSERTION: SHX6199

## 2023-07-04 LAB — RAD ONC ARIA SESSION SUMMARY
Course Elapsed Days: 55
Plan Fractions Treated to Date: 1
Plan Prescribed Dose Per Fraction: 5.5 Gy
Plan Total Fractions Prescribed: 1
Plan Total Prescribed Dose: 5.5 Gy
Reference Point Dosage Given to Date: 11 Gy
Reference Point Dosage Given to Date: 20.2715 Gy
Reference Point Dosage Given to Date: 21.2311 Gy
Reference Point Dosage Given to Date: 8.3973 Gy
Reference Point Dosage Given to Date: 8.5852 Gy
Reference Point Session Dosage Given: 11.794 Gy
Reference Point Session Dosage Given: 11.9093 Gy
Reference Point Session Dosage Given: 4.4983 Gy
Reference Point Session Dosage Given: 4.646 Gy
Reference Point Session Dosage Given: 5.5 Gy
Session Number: 32

## 2023-07-04 SURGERY — INSERTION, UTERINE TANDEM AND RING OR CYLINDER, FOR BRACHYTHERAPY
Anesthesia: General | Site: Vagina

## 2023-07-04 MED ORDER — HYDROMORPHONE HCL 1 MG/ML IJ SOLN: 0.5000 mg | Freq: Once | INTRAMUSCULAR | Status: DC

## 2023-07-04 MED ORDER — ACETAMINOPHEN 500 MG PO TABS
ORAL_TABLET | ORAL | Status: AC
  Filled 2023-07-04: qty 2

## 2023-07-04 MED ORDER — ACETAMINOPHEN 160 MG/5ML PO SOLN: 325.0000 mg | ORAL | Status: DC | PRN

## 2023-07-04 MED ORDER — FENTANYL CITRATE (PF) 100 MCG/2ML IJ SOLN
INTRAMUSCULAR | Status: DC | PRN
  Administered 2023-07-04 (×4): 25 ug via INTRAVENOUS

## 2023-07-04 MED ORDER — OXYCODONE HCL 5 MG/5ML PO SOLN: 5.0000 mg | Freq: Once | ORAL | Status: DC | PRN

## 2023-07-04 MED ORDER — LACTATED RINGERS IV SOLN
INTRAVENOUS | Status: DC
  Filled 2023-07-04 (×2): qty 250

## 2023-07-04 MED ORDER — ONDANSETRON HCL 4 MG/2ML IJ SOLN: 4.0000 mg | Freq: Once | INTRAMUSCULAR | Status: DC | PRN

## 2023-07-04 MED ORDER — SODIUM CHLORIDE 0.9 % IR SOLN
Status: DC | PRN
  Administered 2023-07-04: 250 mL via INTRAVESICAL

## 2023-07-04 MED ORDER — SODIUM CHLORIDE 0.9 % IV SOLN
INTRAVENOUS | Status: DC
  Filled 2023-07-04 (×2): qty 250

## 2023-07-04 MED ORDER — HYDROMORPHONE HCL 1 MG/ML IJ SOLN
0.5000 mg | Freq: Once | INTRAMUSCULAR | Status: AC
  Administered 2023-07-04: 0.5 mg via INTRAVENOUS

## 2023-07-04 MED ORDER — DEXAMETHASONE SODIUM PHOSPHATE 4 MG/ML IJ SOLN
INTRAMUSCULAR | Status: DC | PRN
  Administered 2023-07-04: 5 mg via INTRAVENOUS

## 2023-07-04 MED ORDER — PROPOFOL 10 MG/ML IV BOLUS
INTRAVENOUS | Status: DC | PRN
  Administered 2023-07-04 (×2): 50 mg via INTRAVENOUS
  Administered 2023-07-04: 100 mg via INTRAVENOUS

## 2023-07-04 MED ORDER — HYDROMORPHONE HCL 1 MG/ML IJ SOLN
INTRAMUSCULAR | Status: AC
  Filled 2023-07-04: qty 1

## 2023-07-04 MED ORDER — ESTRADIOL 0.1 MG/GM VA CREA
TOPICAL_CREAM | VAGINAL | Status: DC | PRN
  Administered 2023-07-04: 1 via VAGINAL

## 2023-07-04 MED ORDER — FENTANYL CITRATE (PF) 100 MCG/2ML IJ SOLN
25.0000 ug | INTRAMUSCULAR | Status: DC | PRN
  Administered 2023-07-04 (×2): 25 ug via INTRAVENOUS

## 2023-07-04 MED ORDER — PROPOFOL 10 MG/ML IV BOLUS
INTRAVENOUS | Status: AC
  Filled 2023-07-04: qty 20

## 2023-07-04 MED ORDER — DEXAMETHASONE SODIUM PHOSPHATE 10 MG/ML IJ SOLN
INTRAMUSCULAR | Status: AC
  Filled 2023-07-04: qty 1

## 2023-07-04 MED ORDER — ACETAMINOPHEN 325 MG PO TABS: 325.0000 mg | ORAL_TABLET | ORAL | Status: DC | PRN

## 2023-07-04 MED ORDER — EPHEDRINE SULFATE (PRESSORS) 50 MG/ML IJ SOLN
INTRAMUSCULAR | Status: DC | PRN
  Administered 2023-07-04 (×2): 10 mg via INTRAVENOUS

## 2023-07-04 MED ORDER — ONDANSETRON HCL 4 MG/2ML IJ SOLN
INTRAMUSCULAR | Status: AC
  Filled 2023-07-04: qty 2

## 2023-07-04 MED ORDER — ONDANSETRON HCL 4 MG/2ML IJ SOLN
INTRAMUSCULAR | Status: DC | PRN
  Administered 2023-07-04: 4 mg via INTRAVENOUS

## 2023-07-04 MED ORDER — MEPERIDINE HCL 25 MG/ML IJ SOLN: 6.2500 mg | INTRAMUSCULAR | Status: DC | PRN

## 2023-07-04 MED ORDER — ACETAMINOPHEN 500 MG PO TABS
1000.0000 mg | ORAL_TABLET | Freq: Once | ORAL | Status: AC
  Administered 2023-07-04: 1000 mg via ORAL

## 2023-07-04 MED ORDER — LACTATED RINGERS IV SOLN: INTRAVENOUS | Status: DC

## 2023-07-04 MED ORDER — FENTANYL CITRATE (PF) 100 MCG/2ML IJ SOLN
INTRAMUSCULAR | Status: AC
  Filled 2023-07-04: qty 2

## 2023-07-04 MED ORDER — LIDOCAINE 2% (20 MG/ML) 5 ML SYRINGE
INTRAMUSCULAR | Status: DC | PRN
  Administered 2023-07-04: 40 mg via INTRAVENOUS
  Administered 2023-07-04: 60 mg via INTRAVENOUS

## 2023-07-04 MED ORDER — LIDOCAINE HCL (PF) 2 % IJ SOLN
INTRAMUSCULAR | Status: AC
  Filled 2023-07-04: qty 5

## 2023-07-04 MED ORDER — STERILE WATER FOR IRRIGATION IR SOLN
Status: DC | PRN
  Administered 2023-07-04: 500 mL

## 2023-07-04 MED ORDER — OXYCODONE HCL 5 MG PO TABS: 5.0000 mg | ORAL_TABLET | Freq: Once | ORAL | Status: DC | PRN

## 2023-07-04 SURGICAL SUPPLY — 19 items
DILATOR CANAL MILEX (MISCELLANEOUS) IMPLANT
DRSG TELFA 3X8 NADH STRL (GAUZE/BANDAGES/DRESSINGS) ×2 IMPLANT
GAUZE 4X4 16PLY ~~LOC~~+RFID DBL (SPONGE) ×4 IMPLANT
GAUZE PAD ABD 8X10 STRL (GAUZE/BANDAGES/DRESSINGS) ×2 IMPLANT
GAUZE STRETCH 2X75IN STRL (MISCELLANEOUS) IMPLANT
GLOVE BIO SURGEON STRL SZ7.5 (GLOVE) ×4 IMPLANT
GOWN STRL REUS W/TWL LRG LVL3 (GOWN DISPOSABLE) ×2 IMPLANT
HOLDER FOLEY CATH W/STRAP (MISCELLANEOUS) ×2 IMPLANT
IV NS 1000ML BAXH (IV SOLUTION) ×2 IMPLANT
IV SET ADMIN PUMP GEMINI W/NLD (IV SETS) ×2 IMPLANT
KIT TURNOVER CYSTO (KITS) ×2 IMPLANT
MAT PREVALON FULL STRYKER (MISCELLANEOUS) ×2 IMPLANT
PACK VAGINAL MINOR WOMEN LF (CUSTOM PROCEDURE TRAY) ×2 IMPLANT
PACKING VAGINAL (PACKING) IMPLANT
SLEEVE SCD COMPRESS KNEE MED (STOCKING) ×2 IMPLANT
SOL PREP POV-IOD 4OZ 10% (MISCELLANEOUS) IMPLANT
TOWEL OR 17X24 6PK STRL BLUE (TOWEL DISPOSABLE) ×2 IMPLANT
TRAY FOLEY W/BAG SLVR 14FR LF (SET/KITS/TRAYS/PACK) ×2 IMPLANT
WATER STERILE IRR 500ML POUR (IV SOLUTION) ×2 IMPLANT

## 2023-07-04 NOTE — Anesthesia Postprocedure Evaluation (Signed)
Anesthesia Post Note  Patient: Paula [name]  Procedure(s) Performed: TANDEM RING INSERTION (Vagina ) OPERATIVE ULTRASOUND (Abdomen)     Patient location during evaluation: PACU Anesthesia Type: General Level of consciousness: awake and alert Pain management: pain level controlled Vital Signs Assessment: post-procedure vital signs reviewed and stable Respiratory status: spontaneous breathing, nonlabored ventilation, respiratory function stable and patient connected to nasal cannula oxygen Cardiovascular status: blood pressure returned to baseline and stable Postop Assessment: no apparent nausea or vomiting Anesthetic complications: no   No notable events documented.  Last Vitals:  Vitals:   07/04/23 0942 07/04/23 0945  BP:    Pulse: 78 78  Resp: 20 19  Temp:  37.1 C  SpO2: 91% 95%    Last Pain:  Vitals:   07/04/23 0945  TempSrc: Oral  PainSc:                  [name]

## 2023-07-04 NOTE — H&P (View-Only) (Signed)
Pt evaluated and is ready for OR. Questions answered.  Billie Lade, MD

## 2023-07-04 NOTE — H&P (Signed)
Pt evaluated and is ready for OR. Questions answered.  Billie Lade, MD

## 2023-07-04 NOTE — Brief Op Note (Signed)
07/04/2023  8:56 AM  PATIENT:  Paula [name]  78 y.o. female  PRE-OPERATIVE DIAGNOSIS:  cervical cancer  POST-OPERATIVE DIAGNOSIS:  cervical cancer  PROCEDURE:  Procedure(s): TANDEM RING INSERTION (N/A) OPERATIVE ULTRASOUND (N/A)  SURGEON:  Surgeons and Role:    Antony Blackbird, MD - Primary  PHYSICIAN ASSISTANT:   ASSISTANTS: none   ANESTHESIA:   general  EBL:  0 mL   BLOOD ADMINISTERED:none  DRAINS: Urinary Catheter (Foley)   LOCAL MEDICATIONS USED:  NONE  SPECIMEN:  No Specimen  DISPOSITION OF SPECIMEN:  N/A  COUNTS:  YES  TOURNIQUET:  * No tourniquets in log *  DICTATION: The patient was prepped and draped in the usual sterile fashion and placed in the dorsolithotomy position. timeout for the procedure as well as preoperative medications allergies was performed.  The Foley catheter was placed and backfilled with approximately 250 cc of lactated Ringer's for transabdominal ultrasound imaging..  Exam under anesthesia was performed.  The cervical mass was estimated to be approximately 3 x 3-1/2 cm in size.  There is some thickening along the right parametrial area but no obvious involvement or nodularity.  Ultrasound guidance the uterus was sounded and estimated to be approximately 6.5 cm in length.  The uterus was moderately anteverted.  The 40 mm sleeve placed last week remained in good position.  Given the length of the uterus the 40 mm cervical sleeve was removed and a 60 mm sleeve was placed.  The patient then had placement of a 60 degree, 60 mm tandem with a cervical sleeve in place.  The patient then had placement of a 60 degree cervical ring.  Shielding cap could not be placed due to the narrow vaginal introitus.  A Y-type TUR/bladder irrigation set was obtained and rubber tubing from this set  was placed along the cervical ring to limit high-dose radiation to the mucosal tissue.  An attempt to place the rectal paddle was unsuccessful due to the narrowed vaginal  orifice She then had vaginal packing soaked in Estrace cream placed within the vaginal vault to shield  the rectum and bladder.  Patient tolerated the procedure well.  She will subsequently be transported to radiation oncology for planning and her second high-dose-rate brachytherapy procedure.  She is to receive 5.5 Wallace Cullens to the high risk clinical target volume.   PLAN OF CARE:  transfer to radiation oncology for planning and treatment  PATIENT DISPOSITION:  PACU - hemodynamically stable.   Delay start of Pharmacological VTE agent (>24hrs) due to surgical blood loss or risk of bleeding: not applicable

## 2023-07-04 NOTE — Patient Instructions (Signed)
Paula [name]

## 2023-07-04 NOTE — Transfer of Care (Signed)
Immediate Anesthesia Transfer of Care Note  Patient: Paula [name]  Procedure(s) Performed: Procedure(s) (LRB): TANDEM RING INSERTION (N/A) OPERATIVE ULTRASOUND (N/A)  Patient Location: PACU  Anesthesia Type: GA  Level of Consciousness: awake, sedated, patient cooperative and responds to stimulation, c/o pain in back - comfort measures given w/ medication   Airway & Oxygen Therapy: Patient Spontanous Breathing and Patient connected to [place] oxygen  Post-op Assessment: Report given to PACU RN, Post -op Vital signs reviewed and stable and Patient moving all extremities  Post vital signs: Reviewed and stable  Complications: No apparent anesthesia complications

## 2023-07-04 NOTE — Anesthesia Procedure Notes (Signed)
Procedure Name: LMA Insertion Date/Time: 07/04/2023 7:56 AM  Performed by: Jessica Priest, CRNAPre-anesthesia Checklist: Patient identified, Emergency Drugs available, Suction available, Patient being monitored and Timeout performed Patient Re-evaluated:Patient Re-evaluated prior to induction Oxygen Delivery Method: Circle system utilized Preoxygenation: Pre-oxygenation with 100% oxygen Induction Type: IV induction Ventilation: Mask ventilation without difficulty LMA: LMA inserted LMA Size: 4.0 Number of attempts: 1 Airway Equipment and Method: Bite block Placement Confirmation: positive ETCO2, breath sounds checked- equal and bilateral and CO2 detector Tube secured with: Tape Dental Injury: Teeth and Oropharynx as per pre-operative assessment  Comments: Teeth poor condition LMA placed carefully teeth as preop

## 2023-07-05 ENCOUNTER — Encounter (HOSPITAL_BASED_OUTPATIENT_CLINIC_OR_DEPARTMENT_OTHER): Payer: Self-pay | Admitting: Radiation Oncology

## 2023-07-05 ENCOUNTER — Ambulatory Visit (HOSPITAL_COMMUNITY): Payer: Medicare Other

## 2023-07-05 ENCOUNTER — Ambulatory Visit: Payer: Medicare Other | Admitting: Radiation Oncology

## 2023-07-05 NOTE — Interval H&P Note (Signed)
History and Physical Interval Note:  07/05/2023 8:31 AM  Paula [name]  has presented today for surgery, with the diagnosis of cervical cancer.  The various methods of treatment have been discussed with the patient and family. After consideration of risks, benefits and other options for treatment, the patient has consented to  Procedure(s): TANDEM RING INSERTION (N/A) OPERATIVE ULTRASOUND (N/A) as a surgical intervention.  The patient's history has been reviewed, patient examined, no change in status, stable for surgery.  I have reviewed the patient's chart and labs.  Questions were answered to the patient's satisfaction.     Antony Blackbird

## 2023-07-05 NOTE — Progress Notes (Signed)
Spoke w/ via phone for pre-op interview--- Paula [name] needs dos----NONE         [name] results------ COVID test -----patient states asymptomatic no test needed Arrive at -------0530 NPO after MN NO Solid Food.   Med rec completed Medications to take morning of surgery -----Prozac, Prilosec, Ingrezza, Myrbetriq, Trimpex, Percocet and nausea meds PRN.  Diabetic medication ----- Patient instructed no nail polish to be worn day of surgery Patient instructed to bring photo id and insurance card day of surgery Patient aware to have Driver (ride ) / caregiver    for 24 hours after surgery - Son Paula [name] Patient Special Instructions ----- Pre-Op special Instructions ----- Patient verbalized understanding of instructions that were given at this phone interview. Patient denies chest pain, sob, fever, cough at the interview.

## 2023-07-06 ENCOUNTER — Other Ambulatory Visit (HOSPITAL_COMMUNITY): Payer: Self-pay | Admitting: Radiation Oncology

## 2023-07-06 DIAGNOSIS — Z8541 Personal history of malignant neoplasm of cervix uteri: Secondary | ICD-10-CM

## 2023-07-09 ENCOUNTER — Other Ambulatory Visit: Payer: Self-pay | Admitting: Radiology

## 2023-07-09 ENCOUNTER — Ambulatory Visit: Payer: Medicare Other | Admitting: Radiation Oncology

## 2023-07-09 ENCOUNTER — Ambulatory Visit (HOSPITAL_COMMUNITY): Payer: Medicare Other

## 2023-07-09 NOTE — Progress Notes (Signed)
  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 Identity was confirmed. All relevant records and images related to the planned course of therapy were reviewed. The patient freely provided informed written consent to proceed with treatment after reviewing the details related to the planned course of therapy. The consent form was witnessed and verified by the simulation staff. Then, the patient was set-up in a stable reproducible supine position for radiation therapy. The tandem ring system was accessed and fiducial markers were placed within the tandem and ring.   Simple treatment device note: While in the operating room the patient had construction of her custom tandem ring system. She will be treated with a 60 tandem/ring system. The patient had placement of a 60 mm tandem. A cervical ring with cysto-tubing was used for her treatment. A rectal paddle could not be placed due to patient's anatomy so vaginal packing soaked in Estrace  cream was placed.  Verification simulation note: An AP and lateral film was obtained through the pelvis area. This was compared to the patient's planning films documenting accurate position of the tandem/ring system for treatment.  High-dose-rate brachytherapy treatment note:   The remote afterloading device was accessed through catheter system and attached to the tandem ring system. Patient then proceeded to undergo  her third high-dose-rate treatment directed at the cervix. The patient was prescribed a dose of 5.5 gray to be delivered to the HRCTV.SABRA Patient was treated with 2 channels using 25 dwell positions. Treatment time was 364.9 seconds. The patient tolerated the procedure well. After completion of her therapy, a radiation survey was performed documenting return of the iridium source into the GammaMed safe. The patient was then transferred to the nursing suite.  She then had removal of the vaginal packing followed by removal of the tandem ring system.  PLAN: The patient will return next week for her fourth HDR treatment.  ________________________________   [name], PhD, MD  This document serves as a record of services personally performed by Lynwood Nasuti, MD. It was created on his behalf by Reymundo Cartwright, a trained medical scribe. The creation of this record is based on the scribe's personal observations and the provider's statements to them. This document has been checked and approved by the attending provider.

## 2023-07-10 ENCOUNTER — Ambulatory Visit (HOSPITAL_BASED_OUTPATIENT_CLINIC_OR_DEPARTMENT_OTHER): Payer: Medicare Other | Admitting: Anesthesiology

## 2023-07-10 ENCOUNTER — Ambulatory Visit (HOSPITAL_BASED_OUTPATIENT_CLINIC_OR_DEPARTMENT_OTHER)
Admission: RE | Admit: 2023-07-10 | Discharge: 2023-07-10 | Disposition: A | Discharge status: Other Acute Inpatient Hospital | Payer: Medicare Other | Attending: Radiation Oncology | Admitting: Radiation Oncology

## 2023-07-10 ENCOUNTER — Ambulatory Visit (HOSPITAL_COMMUNITY)
Admission: RE | Admit: 2023-07-10 | Discharge: 2023-07-10 | Disposition: A | Discharge status: Home / Self Care | Payer: Medicare Other | Source: Ambulatory Visit | Attending: Radiation Oncology | Admitting: Radiation Oncology

## 2023-07-10 ENCOUNTER — Encounter (HOSPITAL_BASED_OUTPATIENT_CLINIC_OR_DEPARTMENT_OTHER)
Admission: RE | Disposition: A | Discharge status: Other Acute Inpatient Hospital | Payer: Self-pay | Source: Home / Self Care | Attending: Radiation Oncology

## 2023-07-10 ENCOUNTER — Ambulatory Visit
Admission: RE | Admit: 2023-07-10 | Discharge: 2023-07-10 | Disposition: A | Discharge status: Home / Self Care | Payer: Medicare Other | Source: Ambulatory Visit | Attending: Radiation Oncology | Admitting: Radiation Oncology

## 2023-07-10 ENCOUNTER — Other Ambulatory Visit: Payer: Self-pay

## 2023-07-10 ENCOUNTER — Encounter (HOSPITAL_BASED_OUTPATIENT_CLINIC_OR_DEPARTMENT_OTHER): Payer: Self-pay | Admitting: Radiation Oncology

## 2023-07-10 VITALS — BP 150/68 | HR 72 | Temp 98.6°F | Resp 18

## 2023-07-10 VITALS — BP 150/68 | HR 74 | Temp 98.6°F | Resp 18

## 2023-07-10 DIAGNOSIS — Z9221 Personal history of antineoplastic chemotherapy: Secondary | ICD-10-CM | POA: Insufficient documentation

## 2023-07-10 DIAGNOSIS — Z87891 Personal history of nicotine dependence: Secondary | ICD-10-CM | POA: Insufficient documentation

## 2023-07-10 DIAGNOSIS — C539 Malignant neoplasm of cervix uteri, unspecified: Secondary | ICD-10-CM | POA: Insufficient documentation

## 2023-07-10 DIAGNOSIS — J449 Chronic obstructive pulmonary disease, unspecified: Secondary | ICD-10-CM

## 2023-07-10 DIAGNOSIS — I129 Hypertensive chronic kidney disease with stage 1 through stage 4 chronic kidney disease, or unspecified chronic kidney disease: Secondary | ICD-10-CM | POA: Insufficient documentation

## 2023-07-10 DIAGNOSIS — N183 Chronic kidney disease, stage 3 unspecified: Secondary | ICD-10-CM | POA: Insufficient documentation

## 2023-07-10 DIAGNOSIS — C538 Malignant neoplasm of overlapping sites of cervix uteri: Secondary | ICD-10-CM

## 2023-07-10 DIAGNOSIS — K219 Gastro-esophageal reflux disease without esophagitis: Secondary | ICD-10-CM | POA: Diagnosis not present

## 2023-07-10 DIAGNOSIS — E782 Mixed hyperlipidemia: Secondary | ICD-10-CM | POA: Diagnosis not present

## 2023-07-10 DIAGNOSIS — I10 Essential (primary) hypertension: Secondary | ICD-10-CM

## 2023-07-10 DIAGNOSIS — Z8541 Personal history of malignant neoplasm of cervix uteri: Secondary | ICD-10-CM | POA: Insufficient documentation

## 2023-07-10 DIAGNOSIS — Z923 Personal history of irradiation: Secondary | ICD-10-CM | POA: Diagnosis not present

## 2023-07-10 HISTORY — PX: OPERATIVE ULTRASOUND: SHX5996

## 2023-07-10 HISTORY — PX: TANDEM RING INSERTION: SHX6199

## 2023-07-10 LAB — RAD ONC ARIA SESSION SUMMARY
Course Elapsed Days: 61
Plan Fractions Treated to Date: 1
Plan Prescribed Dose Per Fraction: 5.5 Gy
Plan Total Fractions Prescribed: 1
Plan Total Prescribed Dose: 5.5 Gy
Reference Point Dosage Given to Date: 12.2881 Gy
Reference Point Dosage Given to Date: 12.6044 Gy
Reference Point Dosage Given to Date: 16.5 Gy
Reference Point Dosage Given to Date: 27.8342 Gy
Reference Point Dosage Given to Date: 29.2541 Gy
Reference Point Session Dosage Given: 3.8908 Gy
Reference Point Session Dosage Given: 4.0192 Gy
Reference Point Session Dosage Given: 5.5 Gy
Reference Point Session Dosage Given: 7.5627 Gy
Reference Point Session Dosage Given: 8.023 Gy
Session Number: 33

## 2023-07-10 SURGERY — INSERTION, UTERINE TANDEM AND RING OR CYLINDER, FOR BRACHYTHERAPY
Anesthesia: General

## 2023-07-10 MED ORDER — EPHEDRINE SULFATE (PRESSORS) 50 MG/ML IJ SOLN
INTRAMUSCULAR | Status: DC | PRN
  Administered 2023-07-10 (×4): 5 mg via INTRAVENOUS

## 2023-07-10 MED ORDER — HYDROMORPHONE HCL 1 MG/ML IJ SOLN
0.5000 mg | Freq: Once | INTRAMUSCULAR | Status: AC
  Administered 2023-07-10: 0.5 mg via INTRAVENOUS

## 2023-07-10 MED ORDER — PROPOFOL 500 MG/50ML IV EMUL
INTRAVENOUS | Status: DC | PRN
  Administered 2023-07-10: 150 ug/kg/min via INTRAVENOUS

## 2023-07-10 MED ORDER — ACETAMINOPHEN 325 MG PO TABS: 325.0000 mg | ORAL_TABLET | ORAL | Status: DC | PRN

## 2023-07-10 MED ORDER — ACETAMINOPHEN 160 MG/5ML PO SOLN
325.0000 mg | ORAL | Status: DC | PRN
Start: 2023-07-10 — End: 2023-07-10

## 2023-07-10 MED ORDER — FENTANYL CITRATE (PF) 100 MCG/2ML IJ SOLN
INTRAMUSCULAR | Status: DC | PRN
  Administered 2023-07-10 (×2): 25 ug via INTRAVENOUS

## 2023-07-10 MED ORDER — DROPERIDOL 2.5 MG/ML IJ SOLN: 0.6250 mg | Freq: Once | INTRAMUSCULAR | Status: DC | PRN

## 2023-07-10 MED ORDER — PROPOFOL 10 MG/ML IV BOLUS
INTRAVENOUS | Status: AC
Start: 2023-07-10 — End: ?
  Filled 2023-07-10: qty 20

## 2023-07-10 MED ORDER — FENTANYL CITRATE (PF) 100 MCG/2ML IJ SOLN
INTRAMUSCULAR | Status: AC
  Filled 2023-07-10: qty 2

## 2023-07-10 MED ORDER — ACETAMINOPHEN 10 MG/ML IV SOLN: 1000.0000 mg | Freq: Once | INTRAVENOUS | Status: DC | PRN

## 2023-07-10 MED ORDER — LACTATED RINGERS IV SOLN
INTRAVENOUS | Status: DC
  Filled 2023-07-10: qty 1000

## 2023-07-10 MED ORDER — ESTRADIOL 0.1 MG/GM VA CREA
TOPICAL_CREAM | VAGINAL | Status: DC | PRN
  Administered 2023-07-10: 1 via VAGINAL

## 2023-07-10 MED ORDER — LACTATED RINGERS IV SOLN: INTRAVENOUS | Status: DC

## 2023-07-10 MED ORDER — PROPOFOL 10 MG/ML IV BOLUS
INTRAVENOUS | Status: DC | PRN
  Administered 2023-07-10: 130 mg via INTRAVENOUS

## 2023-07-10 MED ORDER — OXYCODONE HCL 5 MG/5ML PO SOLN: 5.0000 mg | Freq: Once | ORAL | Status: DC | PRN

## 2023-07-10 MED ORDER — PHENYLEPHRINE HCL (PRESSORS) 10 MG/ML IV SOLN
INTRAVENOUS | Status: DC | PRN
  Administered 2023-07-10: 80 ug via INTRAVENOUS

## 2023-07-10 MED ORDER — FENTANYL CITRATE (PF) 100 MCG/2ML IJ SOLN
25.0000 ug | INTRAMUSCULAR | Status: DC | PRN
  Administered 2023-07-10: 25 ug via INTRAVENOUS

## 2023-07-10 MED ORDER — ONDANSETRON HCL 4 MG/2ML IJ SOLN
INTRAMUSCULAR | Status: DC | PRN
  Administered 2023-07-10: 4 mg via INTRAVENOUS

## 2023-07-10 MED ORDER — LIDOCAINE HCL (CARDIAC) PF 100 MG/5ML IV SOSY
PREFILLED_SYRINGE | INTRAVENOUS | Status: DC | PRN
  Administered 2023-07-10: 40 mg via INTRAVENOUS

## 2023-07-10 MED ORDER — DEXAMETHASONE SODIUM PHOSPHATE 4 MG/ML IJ SOLN
INTRAMUSCULAR | Status: DC | PRN
  Administered 2023-07-10: 4 mg via INTRAVENOUS

## 2023-07-10 MED ORDER — HYDROMORPHONE HCL 1 MG/ML IJ SOLN
INTRAMUSCULAR | Status: AC
  Filled 2023-07-10: qty 1

## 2023-07-10 MED ORDER — OXYCODONE HCL 5 MG PO TABS: 5.0000 mg | ORAL_TABLET | Freq: Once | ORAL | Status: DC | PRN

## 2023-07-10 SURGICAL SUPPLY — 15 items
GAUZE PAD ABD 8X10 STRL (GAUZE/BANDAGES/DRESSINGS) ×1 IMPLANT
GLOVE BIO SURGEON STRL SZ7.5 (GLOVE) ×2 IMPLANT
GOWN STRL REUS W/TWL LRG LVL3 (GOWN DISPOSABLE) ×1 IMPLANT
HOLDER FOLEY CATH W/STRAP (MISCELLANEOUS) ×1 IMPLANT
IV NS 250ML BAXH (IV SOLUTION) IMPLANT
IV SET ADMIN PUMP GEMINI W/NLD (IV SETS) ×1 IMPLANT
KIT TURNOVER CYSTO (KITS) ×1 IMPLANT
MAT PREVALON FULL STRYKER (MISCELLANEOUS) ×1 IMPLANT
PACK VAGINAL MINOR WOMEN LF (CUSTOM PROCEDURE TRAY) ×1 IMPLANT
PACKING VAGINAL (PACKING) IMPLANT
SET CYSTO W/LG BORE CLAMP LF (SET/KITS/TRAYS/PACK) IMPLANT
SLEEVE SCD COMPRESS KNEE MED (STOCKING) ×1 IMPLANT
TOWEL OR 17X24 6PK STRL BLUE (TOWEL DISPOSABLE) ×1 IMPLANT
TRAY FOLEY W/BAG SLVR 14FR LF (SET/KITS/TRAYS/PACK) ×1 IMPLANT
WATER STERILE IRR 500ML POUR (IV SOLUTION) ×1 IMPLANT

## 2023-07-10 NOTE — Anesthesia Preprocedure Evaluation (Addendum)
Anesthesia Evaluation  Patient identified by MRN, date of birth, ID band Patient awake    Reviewed: Allergy & Precautions, NPO status , Patient's Chart, lab work & pertinent test results  Airway Mallampati: II  TM Distance: >3 FB Neck ROM: Full    Dental  (+) Teeth Intact, Dental Advisory Given   Pulmonary COPD, former smoker   breath sounds clear to auscultation       Cardiovascular hypertension, + Valvular Problems/Murmurs  Rhythm:Regular Rate:Normal     Neuro/Psych  PSYCHIATRIC DISORDERS  Depression Bipolar Disorder      GI/Hepatic Neg liver ROS,GERD  ,,  Endo/Other    Renal/GU Renal disease     Musculoskeletal  (+) Arthritis ,    Abdominal   Peds  Hematology  (+) Blood dyscrasia, anemia   Anesthesia Other Findings   Reproductive/Obstetrics                             Anesthesia Physical Anesthesia Plan  ASA: 3  Anesthesia Plan: General   Post-op Pain Management: Tylenol PO (pre-op)*   Induction: Intravenous  PONV Risk Score and Plan: 4 or greater and Ondansetron, Dexamethasone, Treatment may vary due to age or medical condition and TIVA  Airway Management Planned: LMA  Additional Equipment: None  Intra-op Plan:   Post-operative Plan: Extubation in OR  Informed Consent: I have reviewed the patients History and Physical, chart, labs and discussed the procedure including the risks, benefits and alternatives for the proposed anesthesia with the patient or authorized representative who has indicated his/her understanding and acceptance.     Dental advisory given  Plan Discussed with: CRNA  Anesthesia Plan Comments:        Anesthesia Quick Evaluation

## 2023-07-10 NOTE — Op Note (Signed)
 07/10/2023  8:44 AM  PATIENT:  Paula [name]  78 y.o. female  PRE-OPERATIVE DIAGNOSIS:  CERVICAL CANCER  POST-OPERATIVE DIAGNOSIS:  CERVICAL CANCER  PROCEDURE:  Procedure(s): TANDEM RING INSERTION (N/A) OPERATIVE ULTRASOUND (N/A)  SURGEON:  Surgeons and Role:    DEWAINE [name] Agent, MD - Primary  PHYSICIAN ASSISTANT:   ASSISTANTS: none   ANESTHESIA:   general  EBL:  none  BLOOD ADMINISTERED:none  DRAINS: Urinary Catheter (Foley)   LOCAL MEDICATIONS USED:  NONE  SPECIMEN:  No Specimen  DISPOSITION OF SPECIMEN:  N/A  COUNTS:  YES  TOURNIQUET:  * No tourniquets in log *  DICTATION:The patient was prepped and draped in the usual sterile fashion and placed in the dorsolithotomy position. timeout for the procedure as well as preoperative medications allergies was performed.  The Foley catheter was placed and backfilled with approximately 200 cc of lactated Ringer 's for transabdominal ultrasound imaging..  Exam under anesthesia was performed.  The cervical mass was estimated to be approximately 3 x 2.9 cm in size on ultrasound.  There is some thickening along the right parametrial area but no obvious involvement or nodularity.  Ultrasound guidance the uterus was sounded and estimated to be approximately 6.5 cm in length.  The uterus was moderately anteverted.  The patient then had placement of a 60 degree, 60 mm tandem with a cervical sleeve in place.  The patient then had placement of a 60 degree cervical ring.  Shielding cap could not be placed due to the narrow vaginal introitus.  A Y-type TUR/bladder irrigation set was obtained and rubber tubing from this set  was placed along the cervical ring to limit high-dose radiation to the mucosal tissue.  An attempt to place the rectal paddle was unsuccessful due to the narrowed vaginal orifice She then had vaginal packing soaked in Estrace  cream placed within the vaginal vault to shield  the rectum and bladder.  Patient tolerated the  procedure well.  She will subsequently be transported to radiation oncology for planning and her third high-dose-rate brachytherapy procedure.  She is to receive 5.5 Gray to the high risk clinical target volume.   PLAN OF CARE:  Transfer to radiation oncology for planning and treatment  PATIENT DISPOSITION:  PACU - hemodynamically stable.   Delay start of Pharmacological VTE agent (>24hrs) due to surgical blood loss or risk of bleeding: not applicable

## 2023-07-10 NOTE — Anesthesia Procedure Notes (Signed)
 Procedure Name: LMA Insertion Date/Time: 07/10/2023 7:49 AM  Performed by: Donnell Berwyn SQUIBB, CRNAPre-anesthesia Checklist: Patient identified, Emergency Drugs available, Suction available, Patient being monitored and Timeout performed Patient Re-evaluated:Patient Re-evaluated prior to induction Oxygen  Delivery Method: Circle system utilized Preoxygenation: Pre-oxygenation with 100% oxygen  Induction Type: IV induction Ventilation: Mask ventilation without difficulty LMA: LMA inserted LMA Size: 4.0 Number of attempts: 1 Placement Confirmation: positive ETCO2 and breath sounds checked- equal and bilateral Tube secured with: Tape Dental Injury: Teeth and Oropharynx as per pre-operative assessment

## 2023-07-10 NOTE — Interval H&P Note (Signed)
 History and Physical Interval Note:  07/10/2023 7:32 AM  Paula [name]  has presented today for surgery, with the diagnosis of CERVICAL CANCER.  The various methods of treatment have been discussed with the patient and family. After consideration of risks, benefits and other options for treatment, the patient has consented to  Procedure(s): TANDEM RING INSERTION (N/A) OPERATIVE ULTRASOUND (N/A) as a surgical intervention.  The patient's history has been reviewed, patient examined, no change in status, stable for surgery.  I have reviewed the patient's chart and labs.  Questions were answered to the patient's satisfaction.     Lynwood Nasuti

## 2023-07-10 NOTE — Addendum Note (Signed)
Addendum  created 07/10/23 1230 by Earmon Phoenix, CRNA   Flowsheet accepted, Intraprocedure Flowsheets edited

## 2023-07-10 NOTE — Transfer of Care (Signed)
 Immediate Anesthesia Transfer of Care Note  Patient: Paula [name]  Procedure(s) Performed: TANDEM RING INSERTION OPERATIVE ULTRASOUND  Patient Location: PACU  Anesthesia Type:General  Level of Consciousness: awake, alert , and patient cooperative  Airway & Oxygen  Therapy: Patient Spontanous Breathing and Patient connected to nasal cannula oxygen   Post-op Assessment: Report given to RN and Post -op Vital signs reviewed and stable  Post vital signs: Reviewed and stable  Last Vitals:  Vitals Value Taken Time  BP    Temp    Pulse 74 07/10/23 0829  Resp 20 07/10/23 0829  SpO2 98 % 07/10/23 0829  Vitals shown include unfiled device data.  Last Pain:  Vitals:   07/10/23 0650  TempSrc: Oral         Complications: No notable events documented.

## 2023-07-10 NOTE — Patient Instructions (Signed)
 IMMEDIATELY FOLLOWING SURGERY: Do not drive or operate machinery for the first twenty four hours after surgery. Do not make any important decisions for twenty four hours after surgery or while taking narcotic pain medications or sedatives. If you develop intractable nausea and vomiting or a severe headache please notify your doctor immediately.   FOLLOW-UP: You do not need to follow up with anesthesia unless specifically instructed to do so.   WOUND CARE INSTRUCTIONS (if applicable): Expect some mild vaginal bleeding, but if large amount of bleeding occurs please contact Dr. [name] at [phone] or the Radiation On-Call physician. Call for any fever greater than 101.0 degrees or increasing vaginal//abdominal pain or trouble urinating.   QUESTIONS?: Please feel free to call your physician or the hospital operator if you have any questions, and they will be happy to assist you. Resume all medications: as listed on your after visit summary. Your next appointment is:  Future Appointments  Date Time Provider Department Center  07/10/2023  3:00 PM [name] Agent, MD [facility] None  07/16/2023  9:00 AM [facility]  1 [facility]-US  [place]  07/16/2023  1:30 PM [name] Agent, MD [facility] None  07/16/2023  3:30 PM [name] Agent, MD [facility] None  07/18/2023  8:00 AM [facility]  1 [facility]-US  [place]  07/18/2023 11:00 AM [name] Agent, MD [facility] None  07/18/2023  2:00 PM [name] Agent, MD [facility] None  08/14/2023 11:00 AM [facility] None  08/14/2023 12:00 PM Lonn Hicks, MD [facility] None

## 2023-07-10 NOTE — Anesthesia Postprocedure Evaluation (Signed)
 Anesthesia Post Note  Patient: Paula [name]  Procedure(s) Performed: TANDEM RING INSERTION OPERATIVE ULTRASOUND     Patient location during evaluation: PACU Anesthesia Type: General Level of consciousness: awake and alert Pain management: pain level controlled Vital Signs Assessment: post-procedure vital signs reviewed and stable Respiratory status: spontaneous breathing, nonlabored ventilation, respiratory function stable and patient connected to nasal cannula oxygen  Cardiovascular status: blood pressure returned to baseline and stable Postop Assessment: no apparent nausea or vomiting Anesthetic complications: no  No notable events documented.  Last Vitals:  Vitals:   07/10/23 0928 07/10/23 0930  BP:  116/64  Pulse: 74 74  Resp: (!) 22 14  Temp:    SpO2: 90% 98%    Last Pain:  Vitals:   07/10/23 0928  TempSrc:   PainSc: 2                  [name]

## 2023-07-11 ENCOUNTER — Encounter (HOSPITAL_BASED_OUTPATIENT_CLINIC_OR_DEPARTMENT_OTHER): Payer: Self-pay | Admitting: Radiation Oncology

## 2023-07-12 ENCOUNTER — Encounter (HOSPITAL_BASED_OUTPATIENT_CLINIC_OR_DEPARTMENT_OTHER): Payer: Self-pay | Admitting: Radiation Oncology

## 2023-07-12 NOTE — Progress Notes (Signed)
 Spoke w/ via phone for pre-op interview--- Grayce Lab needs dos----   NONE      Lab results------ COVID test -----patient states asymptomatic no test needed Arrive at -------0630-Wednesday Feb. 12,2025 NPO after MN NO Solid Food.   Med rec completed Medications to take morning of surgery -----Prozac, Prilosec, Ingrezza, Myrbetriq , Trimpex , percocet and nausea meds PRN.  Diabetic medication ----- Patient instructed no nail polish to be worn day of surgery Patient instructed to bring photo id and insurance card day of surgery Patient aware to have Driver (ride ) / caregiver    for 24 hours after surgery - Son Romelle Muldoon Patient Special Instructions ----- Pre-Op special Instructions ----- Patient verbalized understanding of instructions that were given at this phone interview. Patient denies chest pain, sob, fever, cough at the interview.

## 2023-07-12 NOTE — Progress Notes (Addendum)
 Spoke w/ via phone for pre-op interview--- Paula [name] needs dos----  NONE       [name] results------ COVID test -----patient states asymptomatic no test needed Arrive at -------[facility] Monday Feb 10th,2025 NPO after MN NO Solid Food.   Med rec completed Medications to take morning of surgery -----Prozac, Prilosec, Ingrezza, Myrbetriq , Trimpex , percocet and nausea meds PRN.  Diabetic medication ----- Patient instructed no nail polish to be worn day of surgery Patient instructed to bring photo id and insurance card day of surgery Patient aware to have Driver (ride ) / caregiver    for 24 hours after surgery - Son Paula [name] Patient Special Instructions ----- Pre-Op special Instructions ----- Patient verbalized understanding of instructions that were given at this phone interview. Patient denies chest pain, sob, fever, cough at the interview.

## 2023-07-15 NOTE — Progress Notes (Signed)
  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 Identity was confirmed. All relevant records and images related to the planned course of therapy were reviewed. The patient freely provided informed written consent to proceed with treatment after reviewing the details related to the planned course of therapy. The consent form was witnessed and verified by the simulation staff. Then, the patient was set-up in a stable reproducible supine position for radiation therapy. The tandem ring system was accessed and fiducial markers were placed within the tandem and ring.   Simple treatment device note: While in the operating room the patient had construction of her custom tandem ring system. She will be treated with a 60 tandem/ring system. The patient had placement of a 60 mm tandem. A cervical ring with cysto-tubing was used for her treatment. A rectal paddle could not be placed due to patient's anatomy so vaginal packing soaked in Estrace  cream was placed.  Verification simulation note: An AP and lateral film was obtained through the pelvis area. This was compared to the patient's planning films documenting accurate position of the tandem/ring system for treatment.  High-dose-rate brachytherapy treatment note:   The remote afterloading device was accessed through catheter system and attached to the tandem ring system. Patient then proceeded to  undergo her fourth high-dose-rate treatment directed at the cervix. The patient was prescribed a dose of 5.5 gray to be delivered to the HRCTV.Aaron Aas Patient was treated with 2 channels using 25 dwell positions. Treatment time was 351.3 seconds. The patient tolerated the procedure well. After completion of her therapy, a radiation survey was performed documenting return of the iridium source into the GammaMed safe. The patient was then transferred to the nursing suite.  She then had removal of the vaginal packing followed by removal of the tandem ring system.  PLAN: The patient will return later this week for her fifth HDR treatment.  ________________________________   -----------------------------------  Noralee Beam, PhD, MD  This document serves as a record of services personally performed by Retta Caster, MD. It was created on his behalf by Aleta Anda, a trained medical scribe. The creation of this record is based on the scribe's personal observations and the provider's statements to them. This document has been checked and approved by the attending provider.

## 2023-07-15 NOTE — Anesthesia Preprocedure Evaluation (Addendum)
 Anesthesia Evaluation  Patient identified by MRN, date of birth, ID band Patient awake    Reviewed: Allergy & Precautions, NPO status , Patient's Chart, lab work & pertinent test results  Airway Mallampati: II  TM Distance: >3 FB Neck ROM: Full    Dental no notable dental hx. (+) Upper Dentures, Dental Advisory Given,    Pulmonary COPD, former smoker   Pulmonary exam normal breath sounds clear to auscultation       Cardiovascular hypertension, Normal cardiovascular exam+ Valvular Problems/Murmurs  Rhythm:Regular Rate:Normal     Neuro/Psych    Depression Bipolar Disorder      GI/Hepatic Neg liver ROS,GERD  ,,  Endo/Other    Renal/GU Renal diseaseLab Results      Component                Value               Date                       K                        4.6                 06/21/2023                     CREATININE               1.47 (H)            06/21/2023                     Musculoskeletal  (+) Arthritis ,    Abdominal   Peds  Hematology Lab Results      Component                Value               Date                      WBC                      4.3                 06/21/2023                HGB                      10.4 (L)            06/21/2023                HCT                      30.0 (L)            06/21/2023                MCV                      102.7 (H)           06/21/2023                PLT                      133 (L)  06/21/2023              Anesthesia Other Findings All: PCN  Reproductive/Obstetrics                             Anesthesia Physical Anesthesia Plan  ASA: 2  Anesthesia Plan: General   Post-op Pain Management: Minimal or no pain anticipated   Induction: Intravenous  PONV Risk Score and Plan: 4 or greater and Treatment may vary due to age or medical condition, Ondansetron , Midazolam  and TIVA  Airway Management Planned:  LMA  Additional Equipment: None  Intra-op Plan:   Post-operative Plan: Extubation in OR  Informed Consent: I have reviewed the patients History and Physical, chart, labs and discussed the procedure including the risks, benefits and alternatives for the proposed anesthesia with the patient or authorized representative who has indicated his/her understanding and acceptance.     Dental advisory given  Plan Discussed with: CRNA and Anesthesiologist  Anesthesia Plan Comments: (LMA TIVA)        Anesthesia Quick Evaluation

## 2023-07-16 ENCOUNTER — Ambulatory Visit
Admission: RE | Admit: 2023-07-16 | Discharge: 2023-07-16 | Disposition: A | Discharge status: Home / Self Care | Payer: Medicare Other | Source: Ambulatory Visit | Attending: Radiation Oncology | Admitting: Radiation Oncology

## 2023-07-16 ENCOUNTER — Ambulatory Visit (HOSPITAL_COMMUNITY)
Admission: RE | Admit: 2023-07-16 | Discharge: 2023-07-16 | Disposition: A | Discharge status: Home / Self Care | Payer: Medicare Other | Source: Ambulatory Visit | Attending: Radiation Oncology | Admitting: Radiation Oncology

## 2023-07-16 ENCOUNTER — Ambulatory Visit
Admission: RE | Admit: 2023-07-16 | Discharge: 2023-07-16 | Disposition: A | Discharge status: Home / Self Care | Payer: Medicare Other | Source: Ambulatory Visit | Attending: Internal Medicine | Admitting: Internal Medicine

## 2023-07-16 ENCOUNTER — Ambulatory Visit (HOSPITAL_BASED_OUTPATIENT_CLINIC_OR_DEPARTMENT_OTHER): Payer: Medicare Other | Admitting: Anesthesiology

## 2023-07-16 ENCOUNTER — Other Ambulatory Visit: Payer: Self-pay

## 2023-07-16 ENCOUNTER — Ambulatory Visit (HOSPITAL_BASED_OUTPATIENT_CLINIC_OR_DEPARTMENT_OTHER)
Admission: RE | Admit: 2023-07-16 | Discharge: 2023-07-16 | Disposition: A | Discharge status: Other Acute Inpatient Hospital | Payer: Medicare Other | Attending: Radiation Oncology | Admitting: Radiation Oncology

## 2023-07-16 ENCOUNTER — Encounter (HOSPITAL_BASED_OUTPATIENT_CLINIC_OR_DEPARTMENT_OTHER)
Admission: RE | Disposition: A | Discharge status: Other Acute Inpatient Hospital | Payer: Self-pay | Source: Home / Self Care | Attending: Radiation Oncology

## 2023-07-16 ENCOUNTER — Encounter (HOSPITAL_BASED_OUTPATIENT_CLINIC_OR_DEPARTMENT_OTHER): Payer: Self-pay | Admitting: Radiation Oncology

## 2023-07-16 VITALS — BP 129/73 | HR 70 | Temp 98.2°F | Resp 18

## 2023-07-16 DIAGNOSIS — C530 Malignant neoplasm of endocervix: Secondary | ICD-10-CM | POA: Insufficient documentation

## 2023-07-16 DIAGNOSIS — C539 Malignant neoplasm of cervix uteri, unspecified: Secondary | ICD-10-CM | POA: Diagnosis present

## 2023-07-16 DIAGNOSIS — I10 Essential (primary) hypertension: Secondary | ICD-10-CM | POA: Diagnosis not present

## 2023-07-16 DIAGNOSIS — Z87891 Personal history of nicotine dependence: Secondary | ICD-10-CM | POA: Insufficient documentation

## 2023-07-16 DIAGNOSIS — C538 Malignant neoplasm of overlapping sites of cervix uteri: Secondary | ICD-10-CM

## 2023-07-16 DIAGNOSIS — Z79899 Other long term (current) drug therapy: Secondary | ICD-10-CM | POA: Insufficient documentation

## 2023-07-16 DIAGNOSIS — Z8541 Personal history of malignant neoplasm of cervix uteri: Secondary | ICD-10-CM

## 2023-07-16 DIAGNOSIS — K219 Gastro-esophageal reflux disease without esophagitis: Secondary | ICD-10-CM | POA: Diagnosis not present

## 2023-07-16 DIAGNOSIS — J449 Chronic obstructive pulmonary disease, unspecified: Secondary | ICD-10-CM | POA: Diagnosis not present

## 2023-07-16 HISTORY — PX: OPERATIVE ULTRASOUND: SHX5996

## 2023-07-16 HISTORY — PX: TANDEM RING INSERTION: SHX6199

## 2023-07-16 LAB — RAD ONC ARIA SESSION SUMMARY
Course Elapsed Days: 67
Plan Fractions Treated to Date: 1
Plan Prescribed Dose Per Fraction: 5.5 Gy
Plan Total Fractions Prescribed: 1
Plan Total Prescribed Dose: 5.5 Gy
Reference Point Dosage Given to Date: 15.8796 Gy
Reference Point Dosage Given to Date: 15.9944 Gy
Reference Point Dosage Given to Date: 22 Gy
Reference Point Dosage Given to Date: 34.8703 Gy
Reference Point Dosage Given to Date: 35.7504 Gy
Reference Point Session Dosage Given: 3.3901 Gy
Reference Point Session Dosage Given: 3.5915 Gy
Reference Point Session Dosage Given: 5.5 Gy
Reference Point Session Dosage Given: 6.4963 Gy
Reference Point Session Dosage Given: 7.0361 Gy
Session Number: 34

## 2023-07-16 SURGERY — INSERTION, UTERINE TANDEM AND RING OR CYLINDER, FOR BRACHYTHERAPY
Anesthesia: General | Site: Vagina

## 2023-07-16 MED ORDER — EPHEDRINE SULFATE-NACL 50-0.9 MG/10ML-% IV SOSY
PREFILLED_SYRINGE | INTRAVENOUS | Status: DC | PRN
Start: 2023-07-16 — End: 2023-07-16
  Administered 2023-07-16: 10 mg via INTRAVENOUS

## 2023-07-16 MED ORDER — LACTATED RINGERS IV SOLN
INTRAVENOUS | Status: DC
Start: 2023-07-16 — End: 2023-07-17
  Filled 2023-07-16: qty 1000

## 2023-07-16 MED ORDER — LACTATED RINGERS IV SOLN: INTRAVENOUS | Status: DC

## 2023-07-16 MED ORDER — PROPOFOL 10 MG/ML IV BOLUS
INTRAVENOUS | Status: DC | PRN
  Administered 2023-07-16: 20 mg via INTRAVENOUS
  Administered 2023-07-16: 30 mg via INTRAVENOUS
  Administered 2023-07-16: 120 mg via INTRAVENOUS

## 2023-07-16 MED ORDER — EPHEDRINE 5 MG/ML INJ
INTRAVENOUS | Status: AC
  Filled 2023-07-16: qty 5

## 2023-07-16 MED ORDER — HYDROMORPHONE HCL 1 MG/ML IJ SOLN
INTRAMUSCULAR | Status: AC
  Filled 2023-07-16: qty 1

## 2023-07-16 MED ORDER — DEXAMETHASONE SODIUM PHOSPHATE 10 MG/ML IJ SOLN
INTRAMUSCULAR | Status: DC | PRN
  Administered 2023-07-16: 5 mg via INTRAVENOUS

## 2023-07-16 MED ORDER — ESTRADIOL 0.1 MG/GM VA CREA
TOPICAL_CREAM | VAGINAL | Status: DC | PRN
  Administered 2023-07-16: 1 via VAGINAL

## 2023-07-16 MED ORDER — ARTIFICIAL TEARS OPHTHALMIC OINT
TOPICAL_OINTMENT | OPHTHALMIC | Status: AC
  Filled 2023-07-16: qty 3.5

## 2023-07-16 MED ORDER — ONDANSETRON HCL 4 MG/2ML IJ SOLN
INTRAMUSCULAR | Status: DC | PRN
  Administered 2023-07-16: 4 mg via INTRAVENOUS

## 2023-07-16 MED ORDER — FENTANYL CITRATE (PF) 100 MCG/2ML IJ SOLN
INTRAMUSCULAR | Status: AC
  Filled 2023-07-16: qty 2

## 2023-07-16 MED ORDER — PROPOFOL 500 MG/50ML IV EMUL
INTRAVENOUS | Status: DC | PRN
Start: 2023-07-16 — End: 2023-07-16
  Administered 2023-07-16: 200 ug/kg/min via INTRAVENOUS
  Administered 2023-07-16: 150 ug/kg/min via INTRAVENOUS

## 2023-07-16 MED ORDER — DEXMEDETOMIDINE HCL IN NACL 200 MCG/50ML IV SOLN
INTRAVENOUS | Status: DC | PRN
Start: 2023-07-16 — End: 2023-07-16
  Administered 2023-07-16: 8 ug via INTRAVENOUS

## 2023-07-16 MED ORDER — FENTANYL CITRATE (PF) 100 MCG/2ML IJ SOLN: 25.0000 ug | INTRAMUSCULAR | Status: DC | PRN

## 2023-07-16 MED ORDER — LIDOCAINE 2% (20 MG/ML) 5 ML SYRINGE
INTRAMUSCULAR | Status: DC | PRN
  Administered 2023-07-16: 60 mg via INTRAVENOUS

## 2023-07-16 MED ORDER — SODIUM CHLORIDE 0.9 % IR SOLN
Status: DC | PRN
  Administered 2023-07-16: 1000 mL

## 2023-07-16 MED ORDER — HYDROMORPHONE HCL 1 MG/ML IJ SOLN
0.5000 mg | Freq: Once | INTRAMUSCULAR | Status: AC
  Administered 2023-07-16: 0.5 mg via INTRAVENOUS

## 2023-07-16 MED ORDER — FENTANYL CITRATE (PF) 100 MCG/2ML IJ SOLN
INTRAMUSCULAR | Status: DC | PRN
  Administered 2023-07-16: 50 ug via INTRAVENOUS

## 2023-07-16 MED ORDER — ACETAMINOPHEN 10 MG/ML IV SOLN: 1000.0000 mg | Freq: Once | INTRAVENOUS | Status: DC | PRN

## 2023-07-16 MED ORDER — ONDANSETRON HCL 4 MG/2ML IJ SOLN: 4.0000 mg | Freq: Once | INTRAMUSCULAR | Status: DC | PRN

## 2023-07-16 SURGICAL SUPPLY — 18 items
DILATOR CANAL MILEX (MISCELLANEOUS) IMPLANT
DRSG TELFA 3X8 NADH STRL (GAUZE/BANDAGES/DRESSINGS) ×2 IMPLANT
GAUZE 4X4 16PLY ~~LOC~~+RFID DBL (SPONGE) ×4 IMPLANT
GAUZE PAD ABD 8X10 STRL (GAUZE/BANDAGES/DRESSINGS) ×2 IMPLANT
GAUZE STRETCH 2X75IN STRL (MISCELLANEOUS) IMPLANT
GLOVE BIO SURGEON STRL SZ7.5 (GLOVE) ×4 IMPLANT
GOWN STRL REUS W/TWL LRG LVL3 (GOWN DISPOSABLE) ×2 IMPLANT
HOLDER FOLEY CATH W/STRAP (MISCELLANEOUS) ×2 IMPLANT
IV NS 1000ML BAXH (IV SOLUTION) ×2 IMPLANT
IV SET ADMIN PUMP GEMINI W/NLD (IV SETS) ×2 IMPLANT
KIT TURNOVER CYSTO (KITS) ×2 IMPLANT
MAT PREVALON FULL STRYKER (MISCELLANEOUS) ×2 IMPLANT
PACK VAGINAL MINOR WOMEN LF (CUSTOM PROCEDURE TRAY) ×2 IMPLANT
PACKING VAGINAL (PACKING) IMPLANT
SLEEVE SCD COMPRESS KNEE MED (STOCKING) ×2 IMPLANT
TOWEL OR 17X24 6PK STRL BLUE (TOWEL DISPOSABLE) ×2 IMPLANT
TRAY FOLEY W/BAG SLVR 14FR LF (SET/KITS/TRAYS/PACK) ×2 IMPLANT
WATER STERILE IRR 500ML POUR (IV SOLUTION) ×2 IMPLANT

## 2023-07-16 NOTE — Transfer of Care (Signed)
 Immediate Anesthesia Transfer of Care Note  Patient: Paula [name]  Procedure(s) Performed: TANDEM RING INSERTION (Vagina ) OPERATIVE ULTRASOUND (Abdomen)  Patient Location: PACU  Anesthesia Type:General  Level of Consciousness: awake, alert , oriented, and patient cooperative  Airway & Oxygen  Therapy: Patient Spontanous Breathing  Post-op Assessment: Report given to RN and Post -op Vital signs reviewed and stable  Post vital signs: Reviewed and stable  Last Vitals:  Vitals Value Taken Time  BP 105/55 07/16/23 1008  Temp 36.6 C 07/16/23 1008  Pulse 68 07/16/23 1009  Resp 20 07/16/23 1009  SpO2 93 % 07/16/23 1009  Vitals shown include unfiled device data.  Last Pain:  Vitals:   07/16/23 0744  PainSc: 0-No pain      Patients Stated Pain Goal: 5 (07/16/23 0744)  Complications: No notable events documented.

## 2023-07-16 NOTE — Anesthesia Postprocedure Evaluation (Signed)
 Anesthesia Post Note  Patient: Paula [name]  Procedure(s) Performed: TANDEM RING INSERTION (Vagina ) OPERATIVE ULTRASOUND (Abdomen)     Patient location during evaluation: PACU Anesthesia Type: General Level of consciousness: awake and alert Pain management: pain level controlled Vital Signs Assessment: post-procedure vital signs reviewed and stable Respiratory status: spontaneous breathing, nonlabored ventilation, respiratory function stable and patient connected to nasal cannula oxygen  Cardiovascular status: blood pressure returned to baseline and stable Postop Assessment: no apparent nausea or vomiting Anesthetic complications: no   No notable events documented.  Last Vitals:  Vitals:   07/16/23 1015 07/16/23 1030  BP: (!) 115/58 127/73  Pulse: 69 69  Resp: 17 (!) 23  Temp:    SpO2: 93% 94%    Last Pain:  Vitals:   07/16/23 1030  PainSc: 0-No pain                 Rosalita Combe

## 2023-07-16 NOTE — Anesthesia Procedure Notes (Signed)
 Procedure Name: LMA Insertion Date/Time: 07/16/2023 9:37 AM  Performed by: Ezzie Holstein, CRNAPre-anesthesia Checklist: Patient identified, Emergency Drugs available, Suction available and Patient being monitored Patient Re-evaluated:Patient Re-evaluated prior to induction Oxygen  Delivery Method: Circle System Utilized Preoxygenation: Pre-oxygenation with 100% oxygen  Induction Type: IV induction Ventilation: Mask ventilation without difficulty LMA: LMA inserted LMA Size: 4.0 Number of attempts: 1 Placement Confirmation: positive ETCO2 Tube secured with: Tape Dental Injury: Teeth and Oropharynx as per pre-operative assessment

## 2023-07-16 NOTE — Patient Instructions (Signed)
 IMMEDIATELY FOLLOWING SURGERY: Do not drive or operate machinery for the first twenty four hours after surgery. Do not make any important decisions for twenty four hours after surgery or while taking narcotic pain medications or sedatives. If you develop intractable nausea and vomiting or a severe headache please notify your doctor immediately.   FOLLOW-UP: You do not need to follow up with anesthesia unless specifically instructed to do so.   WOUND CARE INSTRUCTIONS (if applicable): Expect some mild vaginal bleeding, but if large amount of bleeding occurs please contact Dr. Eloise Hake at [phone] or the Radiation On-Call physician. Call for any fever greater than 101.0 degrees or increasing vaginal//abdominal pain or trouble urinating.   QUESTIONS?: Please feel free to call your physician or the hospital operator if you have any questions, and they will be happy to assist you. Resume all medications: as listed on your after visit summary. Your next appointment is:  Future Appointments  Date Time Provider Department Center  07/16/2023  3:30 PM Retta Caster, MD [facility] None  07/18/2023  8:00 AM [facility]  1 [facility]  [place]  07/18/2023 11:00 AM Retta Caster, MD [facility] None  07/18/2023  2:00 PM Retta Caster, MD [facility] None  08/14/2023 11:00 AM [facility] None  08/14/2023 12:00 PM Almeda Jacobs, MD [facility] None

## 2023-07-16 NOTE — Op Note (Signed)
07/16/2023  10:26 AM  PATIENT:  Paula [name]  78 y.o. female  PRE-OPERATIVE DIAGNOSIS:  cervical cancer  POST-OPERATIVE DIAGNOSIS:  cervical cancer  PROCEDURE:  Procedure(s): TANDEM RING INSERTION (N/A) OPERATIVE ULTRASOUND (N/A)  SURGEON:  Surgeons and Role:    Antony Blackbird, MD - Primary  PHYSICIAN ASSISTANT:   ASSISTANTS: none   ANESTHESIA:   general  EBL:  1 mL   BLOOD ADMINISTERED:none  DRAINS: Urinary Catheter (Foley)   LOCAL MEDICATIONS USED:  NONE  SPECIMEN:  No Specimen  DISPOSITION OF SPECIMEN:  N/A  COUNTS:  YES  TOURNIQUET:  * No tourniquets in log *  DICTATION: The patient was prepped and draped in the usual sterile fashion and placed in the dorsolithotomy position. timeout for the procedure as well as preoperative medications allergies was performed.  The Foley catheter was placed and backfilled with approximately 200 cc of lactated Ringer's for transabdominal ultrasound imaging..  Exam under anesthesia was performed.  The cervical mass was estimated to be approximately 3 x 2.9 cm in size on ultrasound.  There is some thickening along the right parametrial area but no obvious involvement or nodularity.  Ultrasound guidance the uterus was sounded and estimated to be approximately 6.5 cm in length.  The uterus was moderately anteverted.  The patient then had placement of a 60 degree, 60 mm tandem with a cervical sleeve in place.  The patient then had placement of a 60 degree cervical ring.  Shielding cap could not be placed due to the narrow vaginal introitus.  A Y-type TUR/bladder irrigation set was obtained and rubber tubing from this set  was placed along the cervical ring to limit high-dose radiation to the mucosal tissue.  An attempt to place the rectal paddle was unsuccessful due to the narrowed vaginal orifice She then had vaginal packing soaked in Estrace cream placed within the vaginal vault to shield  the rectum and bladder.  Patient tolerated the  procedure well.  She will subsequently be transported to radiation oncology for planning and her fourth high-dose-rate brachytherapy procedure.  She is to receive 5.5 Wallace Cullens to the high risk clinical target volume.   PLAN OF CARE: Transfer to radiation oncology for planning and treatment  PATIENT DISPOSITION:  PACU - hemodynamically stable.   Delay start of Pharmacological VTE agent (>24hrs) due to surgical blood loss or risk of bleeding: not applicable

## 2023-07-16 NOTE — Interval H&P Note (Signed)
 History and Physical Interval Note:  07/16/2023 8:55 AM  Paula [name]  has presented today for surgery, with the diagnosis of cervical cancer.  The various methods of treatment have been discussed with the patient and family. After consideration of risks, benefits and other options for treatment, the patient has consented to  Procedure(s): TANDEM RING INSERTION (N/A) OPERATIVE ULTRASOUND (N/A) as a surgical intervention.  The patient's history has been reviewed, patient examined, no change in status, stable for surgery.  I have reviewed the patient's chart and labs.  Questions were answered to the patient's satisfaction.     Retta Caster

## 2023-07-17 ENCOUNTER — Encounter (HOSPITAL_BASED_OUTPATIENT_CLINIC_OR_DEPARTMENT_OTHER): Payer: Self-pay | Admitting: Radiation Oncology

## 2023-07-17 NOTE — Progress Notes (Signed)
  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 Identity was confirmed. All relevant records and images related to the planned course of therapy were reviewed. The patient freely provided informed written consent to proceed with treatment after reviewing the details related to the planned course of therapy. The consent form was witnessed and verified by the simulation staff. Then, the patient was set-up in a stable reproducible supine position for radiation therapy. The tandem ring system was accessed and fiducial markers were placed within the tandem and ring.   Simple treatment device note: While in the operating room the patient had construction of her custom tandem ring system. She will be treated with a 60 tandem/ring system. The patient had placement of a 60 mm tandem. A cervical ring with cysto-tubing was used for her treatment. A rectal paddle could not be placed due to patient's anatomy so vaginal packing soaked in Estrace cream was placed.  Verification simulation note: An AP and lateral film was obtained through the pelvis area. This was compared to the patient's planning films documenting accurate position of the tandem/ring system for treatment.  High-dose-rate brachytherapy treatment note:   The remote afterloading device was accessed through catheter system and attached to the tandem ring system. Patient then proceeded to  undergo her fifth high-dose-rate treatment directed at the cervix. The patient was prescribed a dose of 5.5 gray to be delivered to the HRCTV.Marland Kitchen Patient was treated with 2 channels using 25 dwell positions. Treatment time was *** seconds. The patient tolerated the procedure well. After completion of her therapy, a radiation survey was performed documenting return of the iridium source into the [employer] safe. The patient was then transferred to the nursing suite.  She then had removal of the vaginal packing followed by removal of the tandem ring system.  PLAN: The patient has completed her fifth and last treatment. She will return later for a follow up.  ________________________________   -----------------------------------  Billie Lade, PhD, MD  This document serves as a record of services personally performed by Antony Blackbird, MD. It was created on his behalf by Herbie Saxon, a trained medical scribe. The creation of this record is based on the scribe's personal observations and the provider's statements to them. This document has been checked and approved by the attending provider.

## 2023-07-18 ENCOUNTER — Ambulatory Visit (HOSPITAL_BASED_OUTPATIENT_CLINIC_OR_DEPARTMENT_OTHER): Payer: Medicare Other | Admitting: Anesthesiology

## 2023-07-18 ENCOUNTER — Other Ambulatory Visit: Payer: Self-pay

## 2023-07-18 ENCOUNTER — Encounter (HOSPITAL_BASED_OUTPATIENT_CLINIC_OR_DEPARTMENT_OTHER): Payer: Self-pay | Admitting: Radiation Oncology

## 2023-07-18 ENCOUNTER — Ambulatory Visit
Admission: RE | Admit: 2023-07-18 | Discharge: 2023-07-18 | Disposition: A | Discharge status: Home / Self Care | Payer: Medicare Other | Source: Ambulatory Visit | Attending: Radiation Oncology | Admitting: Radiation Oncology

## 2023-07-18 ENCOUNTER — Ambulatory Visit
Admission: RE | Admit: 2023-07-18 | Discharge: 2023-07-18 | Disposition: A | Discharge status: Home / Self Care | Payer: Medicare Other | Source: Ambulatory Visit | Attending: Internal Medicine | Admitting: Internal Medicine

## 2023-07-18 ENCOUNTER — Ambulatory Visit (HOSPITAL_BASED_OUTPATIENT_CLINIC_OR_DEPARTMENT_OTHER)
Admission: RE | Admit: 2023-07-18 | Discharge: 2023-07-18 | Disposition: A | Discharge status: Other Acute Inpatient Hospital | Payer: Medicare Other | Attending: Radiation Oncology | Admitting: Radiation Oncology

## 2023-07-18 ENCOUNTER — Encounter (HOSPITAL_BASED_OUTPATIENT_CLINIC_OR_DEPARTMENT_OTHER)
Admission: RE | Disposition: A | Discharge status: Other Acute Inpatient Hospital | Payer: Self-pay | Source: Home / Self Care | Attending: Radiation Oncology

## 2023-07-18 ENCOUNTER — Ambulatory Visit (HOSPITAL_COMMUNITY)
Admission: RE | Admit: 2023-07-18 | Discharge: 2023-07-18 | Disposition: A | Discharge status: Home / Self Care | Payer: Medicare Other | Source: Ambulatory Visit | Attending: Radiation Oncology | Admitting: Radiation Oncology

## 2023-07-18 VITALS — BP 130/66 | HR 75 | Resp 18

## 2023-07-18 DIAGNOSIS — C539 Malignant neoplasm of cervix uteri, unspecified: Secondary | ICD-10-CM

## 2023-07-18 DIAGNOSIS — C530 Malignant neoplasm of endocervix: Secondary | ICD-10-CM | POA: Insufficient documentation

## 2023-07-18 DIAGNOSIS — Z87891 Personal history of nicotine dependence: Secondary | ICD-10-CM | POA: Diagnosis not present

## 2023-07-18 DIAGNOSIS — N183 Chronic kidney disease, stage 3 unspecified: Secondary | ICD-10-CM | POA: Diagnosis not present

## 2023-07-18 DIAGNOSIS — C538 Malignant neoplasm of overlapping sites of cervix uteri: Secondary | ICD-10-CM

## 2023-07-18 DIAGNOSIS — Z79899 Other long term (current) drug therapy: Secondary | ICD-10-CM | POA: Insufficient documentation

## 2023-07-18 DIAGNOSIS — G2401 Drug induced subacute dyskinesia: Secondary | ICD-10-CM | POA: Diagnosis not present

## 2023-07-18 DIAGNOSIS — J449 Chronic obstructive pulmonary disease, unspecified: Secondary | ICD-10-CM | POA: Insufficient documentation

## 2023-07-18 DIAGNOSIS — I10 Essential (primary) hypertension: Secondary | ICD-10-CM | POA: Insufficient documentation

## 2023-07-18 DIAGNOSIS — I340 Nonrheumatic mitral (valve) insufficiency: Secondary | ICD-10-CM | POA: Insufficient documentation

## 2023-07-18 DIAGNOSIS — Z8541 Personal history of malignant neoplasm of cervix uteri: Secondary | ICD-10-CM

## 2023-07-18 DIAGNOSIS — I129 Hypertensive chronic kidney disease with stage 1 through stage 4 chronic kidney disease, or unspecified chronic kidney disease: Secondary | ICD-10-CM | POA: Diagnosis not present

## 2023-07-18 HISTORY — PX: TANDEM RING INSERTION: SHX6199

## 2023-07-18 HISTORY — PX: OPERATIVE ULTRASOUND: SHX5996

## 2023-07-18 LAB — RAD ONC ARIA SESSION SUMMARY
Course Elapsed Days: 69
Plan Fractions Treated to Date: 1
Plan Prescribed Dose Per Fraction: 5.5 Gy
Plan Total Fractions Prescribed: 1
Plan Total Prescribed Dose: 5.5 Gy
Reference Point Dosage Given to Date: 19.0262 Gy
Reference Point Dosage Given to Date: 19.0741 Gy
Reference Point Dosage Given to Date: 27.5 Gy
Reference Point Dosage Given to Date: 41.3707 Gy
Reference Point Dosage Given to Date: 42.1076 Gy
Reference Point Session Dosage Given: 3.0797 Gy
Reference Point Session Dosage Given: 3.1466 Gy
Reference Point Session Dosage Given: 5.5 Gy
Reference Point Session Dosage Given: 6.3572 Gy
Reference Point Session Dosage Given: 6.5004 Gy
Session Number: 35

## 2023-07-18 SURGERY — INSERTION, UTERINE TANDEM AND RING OR CYLINDER, FOR BRACHYTHERAPY
Anesthesia: General | Site: Pelvis

## 2023-07-18 MED ORDER — EPHEDRINE SULFATE-NACL 50-0.9 MG/10ML-% IV SOSY
PREFILLED_SYRINGE | INTRAVENOUS | Status: DC | PRN
Start: 2023-07-18 — End: 2023-07-18
  Administered 2023-07-18: 10 mg via INTRAVENOUS
  Administered 2023-07-18: 5 mg via INTRAVENOUS

## 2023-07-18 MED ORDER — FENTANYL CITRATE (PF) 100 MCG/2ML IJ SOLN
25.0000 ug | INTRAMUSCULAR | Status: DC | PRN
  Administered 2023-07-18: 25 ug via INTRAVENOUS

## 2023-07-18 MED ORDER — ONDANSETRON HCL 4 MG/2ML IJ SOLN
INTRAMUSCULAR | Status: DC | PRN
  Administered 2023-07-18: 4 mg via INTRAVENOUS

## 2023-07-18 MED ORDER — ESTRADIOL 0.1 MG/GM VA CREA
TOPICAL_CREAM | VAGINAL | Status: DC | PRN
  Administered 2023-07-18: 1 via VAGINAL

## 2023-07-18 MED ORDER — ACETAMINOPHEN 500 MG PO TABS
ORAL_TABLET | ORAL | Status: AC
  Filled 2023-07-18: qty 2

## 2023-07-18 MED ORDER — PROPOFOL 10 MG/ML IV BOLUS
INTRAVENOUS | Status: DC | PRN
Start: 2023-07-18 — End: 2023-07-18
  Administered 2023-07-18: 130 mg via INTRAVENOUS

## 2023-07-18 MED ORDER — FENTANYL CITRATE (PF) 250 MCG/5ML IJ SOLN
INTRAMUSCULAR | Status: DC | PRN
  Administered 2023-07-18: 25 ug via INTRAVENOUS
  Administered 2023-07-18: 50 ug via INTRAVENOUS
  Administered 2023-07-18: 25 ug via INTRAVENOUS

## 2023-07-18 MED ORDER — LIDOCAINE 2% (20 MG/ML) 5 ML SYRINGE
INTRAMUSCULAR | Status: DC | PRN
  Administered 2023-07-18: 100 mg via INTRAVENOUS

## 2023-07-18 MED ORDER — SODIUM CHLORIDE 0.9 % IV SOLN
INTRAVENOUS | Status: DC
  Filled 2023-07-18 (×2): qty 250

## 2023-07-18 MED ORDER — HYDROMORPHONE HCL 1 MG/ML IJ SOLN
INTRAMUSCULAR | Status: AC
Start: 2023-07-18 — End: ?
  Filled 2023-07-18: qty 1

## 2023-07-18 MED ORDER — ACETAMINOPHEN 500 MG PO TABS
1000.0000 mg | ORAL_TABLET | Freq: Once | ORAL | Status: AC
Start: 2023-07-18 — End: 2023-07-18
  Administered 2023-07-18: 1000 mg via ORAL

## 2023-07-18 MED ORDER — SODIUM CHLORIDE 0.9 % IV SOLN: INTRAVENOUS | Status: DC

## 2023-07-18 MED ORDER — HYDROMORPHONE HCL 1 MG/ML IJ SOLN
INTRAMUSCULAR | Status: AC
  Filled 2023-07-18: qty 1

## 2023-07-18 MED ORDER — SODIUM CHLORIDE 0.9 % IR SOLN
Status: DC | PRN
  Administered 2023-07-18: 1000 mL via INTRAVESICAL

## 2023-07-18 MED ORDER — FENTANYL CITRATE (PF) 100 MCG/2ML IJ SOLN
INTRAMUSCULAR | Status: AC
  Filled 2023-07-18: qty 2

## 2023-07-18 MED ORDER — HYDROMORPHONE HCL 1 MG/ML IJ SOLN
0.5000 mg | Freq: Once | INTRAMUSCULAR | Status: AC
  Administered 2023-07-18: 0.5 mg via INTRAVENOUS

## 2023-07-18 MED ORDER — DEXAMETHASONE SODIUM PHOSPHATE 10 MG/ML IJ SOLN
INTRAMUSCULAR | Status: DC | PRN
  Administered 2023-07-18: 5 mg via INTRAVENOUS

## 2023-07-18 MED ORDER — ONDANSETRON HCL 4 MG/2ML IJ SOLN: 4.0000 mg | Freq: Once | INTRAMUSCULAR | Status: DC | PRN

## 2023-07-18 MED ORDER — PROPOFOL 10 MG/ML IV BOLUS
INTRAVENOUS | Status: AC
  Filled 2023-07-18: qty 20

## 2023-07-18 SURGICAL SUPPLY — 18 items
DILATOR CANAL MILEX (MISCELLANEOUS) IMPLANT
DRSG TELFA 3X8 NADH STRL (GAUZE/BANDAGES/DRESSINGS) ×2 IMPLANT
GAUZE 4X4 16PLY ~~LOC~~+RFID DBL (SPONGE) ×4 IMPLANT
GAUZE PAD ABD 8X10 STRL (GAUZE/BANDAGES/DRESSINGS) ×2 IMPLANT
GAUZE STRETCH 2X75IN STRL (MISCELLANEOUS) IMPLANT
GLOVE BIO SURGEON STRL SZ7.5 (GLOVE) ×4 IMPLANT
GOWN STRL REUS W/TWL LRG LVL3 (GOWN DISPOSABLE) ×2 IMPLANT
HOLDER FOLEY CATH W/STRAP (MISCELLANEOUS) ×2 IMPLANT
IV NS 1000ML BAXH (IV SOLUTION) ×2 IMPLANT
IV SET ADMIN PUMP GEMINI W/NLD (IV SETS) ×2 IMPLANT
KIT TURNOVER CYSTO (KITS) ×2 IMPLANT
MAT PREVALON FULL STRYKER (MISCELLANEOUS) ×2 IMPLANT
PACK VAGINAL MINOR WOMEN LF (CUSTOM PROCEDURE TRAY) ×2 IMPLANT
PACKING VAGINAL (PACKING) IMPLANT
SLEEVE SCD COMPRESS KNEE MED (STOCKING) ×2 IMPLANT
TOWEL OR 17X24 6PK STRL BLUE (TOWEL DISPOSABLE) ×2 IMPLANT
TRAY FOLEY W/BAG SLVR 14FR LF (SET/KITS/TRAYS/PACK) ×2 IMPLANT
WATER STERILE IRR 500ML POUR (IV SOLUTION) ×2 IMPLANT

## 2023-07-18 NOTE — Addendum Note (Signed)
Encounter addended by: Benard Halsted, LPN on: 1/61/0960 12:09 PM  Actions taken: Clinical Note Signed

## 2023-07-18 NOTE — Addendum Note (Signed)
Encounter addended by: Rana Snare, LPN on: 1/61/0960 12:39 PM  Actions taken: [facility] administration accepted

## 2023-07-18 NOTE — Interval H&P Note (Signed)
History and Physical Interval Note:  07/18/2023 8:30 AM  Francoise Ceo  has presented today for surgery, with the diagnosis of CERVICAL CANCER.  The various methods of treatment have been discussed with the patient and family. After consideration of risks, benefits and other options for treatment, the patient has consented to  Procedure(s): TANDEM RING INSERTION (N/A) OPERATIVE ULTRASOUND (N/A) as a surgical intervention.  The patient's history has been reviewed, patient examined, no change in status, stable for surgery.  I have reviewed the patient's chart and labs.  Questions were answered to the patient's satisfaction.     Antony Blackbird

## 2023-07-18 NOTE — Anesthesia Procedure Notes (Signed)
Procedure Name: LMA Insertion Date/Time: 07/18/2023 8:43 AM  Performed by: Dairl Ponder, CRNAPre-anesthesia Checklist: Patient identified, Emergency Drugs available, Suction available and Patient being monitored Patient Re-evaluated:Patient Re-evaluated prior to induction Oxygen Delivery Method: Circle System Utilized Preoxygenation: Pre-oxygenation with 100% oxygen Induction Type: IV induction Ventilation: Mask ventilation without difficulty LMA: LMA inserted LMA Size: 4.0 Number of attempts: 1 Airway Equipment and Method: Bite block Placement Confirmation: positive ETCO2 Tube secured with: Tape Dental Injury: Teeth and Oropharynx as per pre-operative assessment

## 2023-07-18 NOTE — Patient Instructions (Signed)
IMMEDIATELY FOLLOWING SURGERY: Do not drive or operate machinery for the first twenty four hours after surgery. Do not make any important decisions for twenty four hours after surgery or while taking narcotic pain medications or sedatives. If you develop intractable nausea and vomiting or a severe headache please notify your doctor immediately.   FOLLOW-UP: You do not need to follow up with anesthesia unless specifically instructed to do so.   WOUND CARE INSTRUCTIONS (if applicable): Expect some mild vaginal bleeding, but if large amount of bleeding occurs please contact Dr. Roselind Messier at [phone] or the Radiation On-Call physician. Call for any fever greater than 101.0 degrees or increasing vaginal//abdominal pain or trouble urinating.   QUESTIONS?: Please feel free to call your physician or the hospital operator if you have any questions, and they will be happy to assist you. Resume all medications: as listed on your after visit summary. Your next appointment is:  Future Appointments  Date Time Provider Department Center  07/18/2023 12:15 PM [facility] NURSE [facility] None  07/18/2023  2:00 PM Antony Blackbird, MD [facility] None  08/14/2023 11:00 AM [facility] None  08/14/2023 12:00 PM Artis Delay, MD [facility] None  08/16/2023  9:15 AM Antony Blackbird, MD [facility] None

## 2023-07-18 NOTE — Addendum Note (Signed)
Encounter addended by: Rana Snare, LPN on: 1/61/0960 1:53 PM  Actions taken: [facility] administration accepted

## 2023-07-18 NOTE — Anesthesia Preprocedure Evaluation (Addendum)
Anesthesia Evaluation  Patient identified by MRN, date of birth, ID band Patient awake    Reviewed: Allergy & Precautions, NPO status , Patient's Chart, lab work & pertinent test results  Airway Mallampati: II  TM Distance: >3 FB Neck ROM: Full    Dental  (+) Dental Advisory Given, Missing,    Pulmonary COPD, former smoker   Pulmonary exam normal breath sounds clear to auscultation       Cardiovascular hypertension, Normal cardiovascular exam+ Valvular Problems/Murmurs MR  Rhythm:Regular Rate:Normal     Neuro/Psych  PSYCHIATRIC DISORDERS  Depression Bipolar Disorder   Tardive dyskinesia    GI/Hepatic ,GERD  Medicated,,(+)     substance abuse  alcohol use and marijuana use  Endo/Other  negative endocrine ROS    Renal/GU Renal InsufficiencyRenal disease Bladder dysfunction      Musculoskeletal  (+) Arthritis ,    Abdominal   Peds  Hematology negative hematology ROS (+)   Anesthesia Other Findings Day of surgery medications reviewed with the patient.  Reproductive/Obstetrics Cervical cancer                              Anesthesia Physical Anesthesia Plan  ASA: 3  Anesthesia Plan: General   Post-op Pain Management: Tylenol PO (pre-op)*   Induction: Intravenous  PONV Risk Score and Plan: 3 and Dexamethasone and Ondansetron  Airway Management Planned: LMA  Additional Equipment:   Intra-op Plan:   Post-operative Plan: Extubation in OR  Informed Consent: I have reviewed the patients History and Physical, chart, labs and discussed the procedure including the risks, benefits and alternatives for the proposed anesthesia with the patient or authorized representative who has indicated his/her understanding and acceptance.     Dental advisory given  Plan Discussed with: CRNA  Anesthesia Plan Comments:         Anesthesia Quick Evaluation

## 2023-07-18 NOTE — Addendum Note (Signed)
Encounter addended by: Benard Halsted, LPN on: 02/01/5620 1:21 PM  Actions taken: LDA properties accepted

## 2023-07-18 NOTE — Op Note (Signed)
07/18/2023  9:54 AM  PATIENT:  Paula [name]  78 y.o. female  PRE-OPERATIVE DIAGNOSIS:  CERVICAL CANCER  POST-OPERATIVE DIAGNOSIS:  CERVICAL CANCER  PROCEDURE:  Procedure(s): TANDEM RING INSERTION (N/A) OPERATIVE ULTRASOUND (N/A)  SURGEON:  Surgeons and Role:    Antony Blackbird, MD - Primary  PHYSICIAN ASSISTANT:   ASSISTANTS: none   ANESTHESIA:   general  EBL: none  BLOOD ADMINISTERED:none  DRAINS: Urinary Catheter (Foley)   LOCAL MEDICATIONS USED:  NONE  SPECIMEN:  No Specimen  DISPOSITION OF SPECIMEN:  N/A  COUNTS:  YES  TOURNIQUET:  * No tourniquets in log *  DICTATION: The patient was prepped and draped in the usual sterile fashion and placed in the dorsolithotomy position. timeout for the procedure as well as preoperative medications allergies was performed.  The Foley catheter was placed and backfilled with approximately 200 cc of lactated Ringer's for transabdominal ultrasound imaging..  Exam under anesthesia was performed.  The cervical mass was estimated to be approximately 3 x 2.9 cm in size on ultrasound.  There is some thickening along the right parametrial area but no obvious involvement or nodularity.  Ultrasound guidance the uterus was sounded and estimated to be approximately 6.5 cm in length.  The uterus was moderately anteverted.  The patient then had placement of a 60 degree, 60 mm tandem with a cervical sleeve in place.  The patient then had placement of a 60 degree cervical ring.  Shielding cap could not be placed due to the narrow vaginal introitus.  A Y-type TUR/bladder irrigation set was obtained and rubber tubing from this set  was placed along the cervical ring to limit high-dose radiation to the mucosal tissue.  An attempt to place the rectal paddle was unsuccessful due to the narrowed vaginal orifice She then had vaginal packing soaked in Estrace cream placed within the vaginal vault to shield  the rectum and bladder.  Patient tolerated the  procedure well.  She will subsequently be transported to radiation oncology for planning and her fifth high-dose-rate brachytherapy procedure.  She is to receive 5.5 Wallace Cullens to the high risk clinical target volume.   PLAN OF CARE:  Transfer to radiation oncology for planning and treatment  PATIENT DISPOSITION:  PACU - hemodynamically stable.   Delay start of Pharmacological VTE agent (>24hrs) due to surgical blood loss or risk of bleeding: not applicable

## 2023-07-18 NOTE — Progress Notes (Signed)
Pt suicide risk screen positive for thoughts of wishing she were dead, negative for thoughts of killing herself or developing a plan. Low risk category screening, pt has psychiatrist and has appointments for counseling. Most recent was "a couple weeks ago." MD notified.

## 2023-07-18 NOTE — Anesthesia Postprocedure Evaluation (Signed)
Anesthesia Post Note  Patient: Paula [name]  Procedure(s) Performed: TANDEM RING INSERTION (Cervix) OPERATIVE ULTRASOUND (Pelvis)     Patient location during evaluation: PACU Anesthesia Type: General Level of consciousness: awake and alert Pain management: pain level controlled Vital Signs Assessment: post-procedure vital signs reviewed and stable Respiratory status: spontaneous breathing, nonlabored ventilation and respiratory function stable Cardiovascular status: blood pressure returned to baseline and stable Postop Assessment: no apparent nausea or vomiting Anesthetic complications: no   No notable events documented.  Last Vitals:  Vitals:   07/18/23 1004 07/18/23 1006  BP:    Pulse: 73 72  Resp: (!) 22 16  Temp:  36.7 C  SpO2: 100% 99%    Last Pain:  Vitals:   07/18/23 1006  TempSrc: Oral  PainSc:                  [name]

## 2023-07-18 NOTE — Addendum Note (Signed)
Encounter addended by: Rana Snare, LPN on: 1/32/4401 12:30 PM  Actions taken: Order list changed, Diagnosis association updated

## 2023-07-18 NOTE — Addendum Note (Signed)
Encounter addended by: Rana Snare, LPN on: 6/44/0347 12:52 PM  Actions taken: LDA properties accepted

## 2023-07-18 NOTE — Transfer of Care (Signed)
Immediate Anesthesia Transfer of Care Note  Patient: Paula [name]  Procedure(s) Performed: TANDEM RING INSERTION (Cervix) OPERATIVE ULTRASOUND (Pelvis)  Patient Location: PACU  Anesthesia Type:General  Level of Consciousness: sedated  Airway & Oxygen Therapy: Patient Spontanous Breathing  Post-op Assessment: Report given to RN and Post -op Vital signs reviewed and stable  Post vital signs: Reviewed and stable  Last Vitals:  Vitals Value Taken Time  BP 130/61 07/18/23 0916  Temp 36.6 C 07/18/23 0916  Pulse 78 07/18/23 0917  Resp 16 07/18/23 0917  SpO2 94 % 07/18/23 0917  Vitals shown include unfiled device data.  Last Pain:  Vitals:   07/18/23 0748  TempSrc: Oral  PainSc: 0-No pain      Patients Stated Pain Goal: 6 (07/18/23 0748)  Complications: No notable events documented.

## 2023-07-19 ENCOUNTER — Other Ambulatory Visit: Payer: Self-pay

## 2023-07-19 ENCOUNTER — Encounter (HOSPITAL_BASED_OUTPATIENT_CLINIC_OR_DEPARTMENT_OTHER): Payer: Self-pay | Admitting: Radiation Oncology

## 2023-07-19 NOTE — Radiation Completion Notes (Addendum)
  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     ==========DELIVERED PLANS==========  First Treatment Date: 2023-05-10 Last Treatment Date: 2023-07-18   Plan Name: Pelvis Site: Pelvis Technique: IMRT Mode: Photon Dose Per Fraction: 1.8 Gy Prescribed Dose (Delivered / Prescribed): 34.2 Gy / 34.2 Gy Prescribed Fxs (Delivered / Prescribed): 19 / 19   Plan Name: Pelvis:1 Site: Pelvis Technique: IMRT Mode: Photon Dose Per Fraction: 1.8 Gy Prescribed Dose (Delivered / Prescribed): 10.8 Gy / 10.8 Gy Prescribed Fxs (Delivered / Prescribed): 6 / 6   Plan Name: Pelvis_Bst Site: Pelvis Technique: 3D Mode: Photon Dose Per Fraction: 1.8 Gy Prescribed Dose (Delivered / Prescribed): 9 Gy / 9 Gy Prescribed Fxs (Delivered / Prescribed): 5 / 5   Plan Name: Cervix_HDR_F1 Site: Cervix Technique: HDR Ir-192 Mode: Brachytherapy Dose Per Fraction: 5.5 Gy Prescribed Dose (Delivered / Prescribed): 5.5 Gy / 5.5 Gy Prescribed Fxs (Delivered / Prescribed): 1 / 1   Plan Name: Cervix_HDR_F2 Site: Cervix Technique: HDR Ir-192 Mode: Brachytherapy Dose Per Fraction: 5.5 Gy Prescribed Dose (Delivered / Prescribed): 5.5 Gy / 5.5 Gy Prescribed Fxs (Delivered / Prescribed): 1 / 1   Plan Name: Cervix_HDR_F3 Site: Cervix Technique: HDR Ir-192 Mode: Brachytherapy Dose Per Fraction: 5.5 Gy Prescribed Dose (Delivered / Prescribed): 5.5 Gy / 5.5 Gy Prescribed Fxs (Delivered / Prescribed): 1 / 1   Plan Name: Cervix_HDR_F4 Site: Cervix Technique: HDR Ir-192 Mode: Brachytherapy Dose Per Fraction: 5.5 Gy Prescribed Dose (Delivered / Prescribed): 5.5 Gy / 5.5 Gy Prescribed Fxs (Delivered / Prescribed): 1 / 1   Plan Name: Cervix_HDR_F5 Site:  Cervix Technique: HDR Ir-192 Mode: Brachytherapy Dose Per Fraction: 5.5 Gy Prescribed Dose (Delivered / Prescribed): 5.5 Gy / 5.5 Gy Prescribed Fxs (Delivered / Prescribed): 1 / 1     ====================================   The patient tolerated radiation relatively well. She completed radiation directed at the pelvis and cervical boost treatment on 07/18/2023. During her final weekly treatment check of Pelvic RT on 06/19/2023 the patient was experiencing moderate fatigue and diarrhea. She was given Lomotil to help with this. She denies any pain, skin irritation, vaginal or rectal bleeding or issues with her bladder.  The patient then proceeded with 5 fractions HDR brachytherapy directed at the cervix on 06/28/2023 through 07/18/2023 which she tolerated well overall.   The patient will return in one month and will continue follow up with Dr. Alvester Morin as well as Dr. Juluis Mire, PA-C

## 2023-07-23 NOTE — Addendum Note (Signed)
Encounter addended by: Erven Colla, PA-C on: 07/23/2023 1:58 PM  Actions taken: Clinical Note Signed

## 2023-07-23 NOTE — Addendum Note (Signed)
Encounter addended by: Erven Colla, PA-C on: 07/23/2023 1:56 PM  Actions taken: Clinical Note Signed

## 2023-08-14 ENCOUNTER — Encounter: Payer: Self-pay | Admitting: Radiation Oncology

## 2023-08-14 ENCOUNTER — Inpatient Hospital Stay: Payer: Medicare Other | Attending: Psychiatry | Admitting: Hematology and Oncology

## 2023-08-14 ENCOUNTER — Inpatient Hospital Stay: Payer: Medicare Other

## 2023-08-14 ENCOUNTER — Other Ambulatory Visit: Payer: Self-pay

## 2023-08-14 ENCOUNTER — Encounter: Payer: Self-pay | Admitting: Hematology and Oncology

## 2023-08-14 DIAGNOSIS — D509 Iron deficiency anemia, unspecified: Secondary | ICD-10-CM | POA: Insufficient documentation

## 2023-08-14 DIAGNOSIS — Z923 Personal history of irradiation: Secondary | ICD-10-CM | POA: Diagnosis not present

## 2023-08-14 DIAGNOSIS — R232 Flushing: Secondary | ICD-10-CM | POA: Insufficient documentation

## 2023-08-14 DIAGNOSIS — D539 Nutritional anemia, unspecified: Secondary | ICD-10-CM | POA: Diagnosis not present

## 2023-08-14 DIAGNOSIS — Z9221 Personal history of antineoplastic chemotherapy: Secondary | ICD-10-CM | POA: Diagnosis not present

## 2023-08-14 DIAGNOSIS — C530 Malignant neoplasm of endocervix: Secondary | ICD-10-CM | POA: Insufficient documentation

## 2023-08-14 DIAGNOSIS — C538 Malignant neoplasm of overlapping sites of cervix uteri: Secondary | ICD-10-CM

## 2023-08-14 DIAGNOSIS — R197 Diarrhea, unspecified: Secondary | ICD-10-CM

## 2023-08-14 DIAGNOSIS — N951 Menopausal and female climacteric states: Secondary | ICD-10-CM | POA: Diagnosis not present

## 2023-08-14 DIAGNOSIS — Z08 Encounter for follow-up examination after completed treatment for malignant neoplasm: Secondary | ICD-10-CM | POA: Insufficient documentation

## 2023-08-14 LAB — COMPREHENSIVE METABOLIC PANEL
ALT: 20 U/L (ref 0–44)
AST: 18 U/L (ref 15–41)
Albumin: 4.1 g/dL (ref 3.5–5.0)
Alkaline Phosphatase: 85 U/L (ref 38–126)
Anion gap: 8 (ref 5–15)
BUN: 17 mg/dL (ref 8–23)
CO2: 22 mmol/L (ref 22–32)
Calcium: 9.5 mg/dL (ref 8.9–10.3)
Chloride: 108 mmol/L (ref 98–111)
Creatinine, Ser: 1.53 mg/dL — ABNORMAL HIGH (ref 0.44–1.00)
GFR, Estimated: 35 mL/min — ABNORMAL LOW (ref >60)
Glucose, Bld: 95 mg/dL (ref 70–99)
Potassium: 4 mmol/L (ref 3.5–5.1)
Sodium: 138 mmol/L (ref 135–145)
Total Bilirubin: 0.6 mg/dL (ref 0.0–1.2)
Total Protein: 6.6 g/dL (ref 6.5–8.1)

## 2023-08-14 LAB — CBC WITH DIFFERENTIAL/PLATELET
Abs Immature Granulocytes: 0.02 10*3/uL (ref 0.00–0.07)
Basophils Absolute: 0 10*3/uL (ref 0.0–0.1)
Basophils Relative: 1 %
Eosinophils Absolute: 0.3 10*3/uL (ref 0.0–0.5)
Eosinophils Relative: 5 %
HCT: 31.5 % — ABNORMAL LOW (ref 36.0–46.0)
Hemoglobin: 10.6 g/dL — ABNORMAL LOW (ref 12.0–15.0)
Immature Granulocytes: 0 %
Lymphocytes Relative: 6 %
Lymphs Abs: 0.3 10*3/uL — ABNORMAL LOW (ref 0.7–4.0)
MCH: 34.3 pg — ABNORMAL HIGH (ref 26.0–34.0)
MCHC: 33.7 g/dL (ref 30.0–36.0)
MCV: 101.9 fL — ABNORMAL HIGH (ref 80.0–100.0)
Monocytes Absolute: 0.5 10*3/uL (ref 0.1–1.0)
Monocytes Relative: 8 %
Neutro Abs: 4.4 10*3/uL (ref 1.7–7.7)
Neutrophils Relative %: 80 %
Platelets: 170 10*3/uL (ref 150–400)
RBC: 3.09 MIL/uL — ABNORMAL LOW (ref 3.87–5.11)
RDW: 11.9 % (ref 11.5–15.5)
WBC: 5.5 10*3/uL (ref 4.0–10.5)
nRBC: 0 % (ref 0.0–0.2)

## 2023-08-14 LAB — MAGNESIUM: Magnesium: 1.6 mg/dL — ABNORMAL LOW (ref 1.7–2.4)

## 2023-08-14 MED ORDER — SODIUM CHLORIDE 0.9% FLUSH
10.0000 mL | Freq: Once | INTRAVENOUS | Status: AC
Start: 2023-08-14 — End: 2023-08-14
  Administered 2023-08-14: 10 mL

## 2023-08-14 MED ORDER — GABAPENTIN 300 MG PO CAPS: 300.0000 mg | ORAL_CAPSULE | Freq: Every day | ORAL | 3 refills | Status: DC

## 2023-08-14 MED ORDER — HEPARIN SOD (PORK) LOCK FLUSH 100 UNIT/ML IV SOLN
500.0000 [IU] | Freq: Once | INTRAVENOUS | Status: AC
  Administered 2023-08-14: 500 [IU]

## 2023-08-14 NOTE — Progress Notes (Signed)
 [facility] OFFICE PROGRESS NOTE  Patient Care Team: Corwin Levins, MD as PCP - General (Internal Medicine) Janet Berlin, MD as Consulting Physician (Ophthalmology) Rosalyn Gess, MD as Consulting Physician (Gynecology)  Assessment & Plan Hypomagnesemia This has improved since discontinuation of treatment I will discontinue monitoring She is not symptomatic Malignant neoplasm of overlapping sites of cervix [facility]) She was diagnosed with stage III cervical cancer after presentation with postmenopausal bleeding in October 2024 Cervical biopsy revealed squamous cell carcinoma, PD-L1 is negative, CPS <1%  She received concurrent chemoradiation therapy treatment with cisplatin Last cycle of chemotherapy was completed by January 2025, complicated by hearing changes and pancytopenia Radiation therapy completed by February 2025  She is still recovering from side effects of treatment I plan to repeat blood work as well as imaging study in May for objective assessment of response to therapy Hot flashes She has recurrent symptoms with hot flashes since completion of treatment I recommend trial of low-dose gabapentin at night We discussed risk, benefits, side effects of treatment Macrocytic anemia She has persistent microcytic anemia since discontinuation of treatment I will order folic acid and B12 level in her next visit  Orders Placed This Encounter  Procedures   NM PET Image Restage (PS) Skull Base to Thigh (F-18 FDG)    Standing Status:   Future    Expected Date:   10/15/2023    Expiration Date:   08/13/2024    If indicated for the ordered procedure, I authorize the administration of a radiopharmaceutical per Radiology protocol:   Yes    Preferred imaging location?:   [facility]    Radiology Contrast Protocol - do NOT remove file path:   [email or web address]   Magnesium    Standing Status:   Future    Number of  Occurrences:   1    Expiration Date:   08/13/2024   Vitamin B12    Standing Status:   Future    Expected Date:   10/15/2023    Expiration Date:   08/13/2024   Folate RBC    Standing Status:   Future    Expected Date:   10/15/2023    Expiration Date:   08/13/2024     Artis Delay, MD  INTERVAL HISTORY: she returns for surveillance follow-up She has recently completed radiation treatment She is here accompanied by her son After treatment, she developed significant depression and was placed on multiple different medications Her depression was alleviated but now she has some sensation of shakiness and jittery She has intermittent hot flashes that is debilitating since completion of treatment She denies abnormal vaginal bleeding or discharge Her bowel habits has returned back to normal She still have persistent hearing loss since discontinuation of chemotherapy We discussed test results and future plan of care as outlined above  PHYSICAL EXAMINATION: ECOG PERFORMANCE STATUS: 1 - Symptomatic but completely ambulatory  Vitals:   08/14/23 1155  BP: 122/68  Pulse: 73  Resp: 18  Temp: 98.9 F (37.2 C)  SpO2: 100%   Filed Weights   08/14/23 1155  Weight: 151 lb 3.2 oz (68.6 kg)

## 2023-08-14 NOTE — Assessment & Plan Note (Addendum)
 She has recurrent symptoms with hot flashes since completion of treatment I recommend trial of low-dose gabapentin at night We discussed risk, benefits, side effects of treatment

## 2023-08-14 NOTE — Assessment & Plan Note (Addendum)
 This has improved since discontinuation of treatment I will discontinue monitoring She is not symptomatic

## 2023-08-14 NOTE — Assessment & Plan Note (Addendum)
 She was diagnosed with stage III cervical cancer after presentation with postmenopausal bleeding in October 2024 Cervical biopsy revealed squamous cell carcinoma, PD-L1 is negative, CPS <1%  She received concurrent chemoradiation therapy treatment with cisplatin Last cycle of chemotherapy was completed by January 2025, complicated by hearing changes and pancytopenia Radiation therapy completed by February 2025  She is still recovering from side effects of treatment I plan to repeat blood work as well as imaging study in May for objective assessment of response to therapy

## 2023-08-14 NOTE — Assessment & Plan Note (Addendum)
 She has persistent microcytic anemia since discontinuation of treatment I will order folic acid and B12 level in her next visit

## 2023-08-15 ENCOUNTER — Telehealth: Payer: Self-pay | Admitting: Hematology and Oncology

## 2023-08-15 NOTE — Telephone Encounter (Signed)
 Spoke with patient confirming upcoming appointment

## 2023-08-15 NOTE — Progress Notes (Incomplete)
 Radiation Oncology         [phone] ________________________________  Name: Paula [name] MRN: [id number]  Date: 08/16/2023  DOB: [birth year]  Follow-Up Visit Note  CC: Corwin Levins, MD  Corwin Levins, MD  No diagnosis found.  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   Cancer Staging  Malignant neoplasm of cervix [facility]) Staging form: Cervix Uteri, AJCC Version 9 - Clinical stage from 04/05/2023: FIGO Stage IIIC1r, calculated as Stage IIIC1 (cT2b, cN1, cM0) - Signed by Clide Cliff, MD on 04/16/2023  Interval Since Last Radiation:  28 days   First Treatment Date: 2023-05-10 Last Treatment Date: 2023-07-18   Plan Name: Pelvis Site: Pelvis Technique: IMRT Mode: Photon Dose Per Fraction: 1.8 Gy Prescribed Dose (Delivered / Prescribed): 34.2 Gy / 34.2 Gy Prescribed Fxs (Delivered / Prescribed): 19 / 19   Plan Name: Pelvis:1 Site: Pelvis Technique: IMRT Mode: Photon Dose Per Fraction: 1.8 Gy Prescribed Dose (Delivered / Prescribed): 10.8 Gy / 10.8 Gy Prescribed Fxs (Delivered / Prescribed): 6 / 6   Plan Name: Pelvis_Bst Site: Pelvis Technique: 3D Mode: Photon Dose Per Fraction: 1.8 Gy Prescribed Dose (Delivered / Prescribed): 9 Gy / 9 Gy Prescribed Fxs (Delivered / Prescribed): 5 / 5   Plan Name: Cervix_HDR_F1-5 Site: Cervix Technique: HDR Ir-192 Mode: Brachytherapy Dose Per Fraction: 5.5 Gy Prescribed Dose (Delivered / Prescribed): 27.5 Gy / 27.5 Gy Prescribed Fxs (Delivered / Prescribed): 5 / 5  Narrative:  The patient returns today for routine follow-up.  She was last seen in office on 05-01-23 for a follow up visit. Since then, she completed her radiation treatment. Patient did endorse developing moderate fatigue and diarrhea. She was placed of Lomotil to help relief symptoms. No further significant side effects were reported.   Patient continued to follow up with Dr. Bertis Ruddy. On a follow up on  06-12-23, she complained of worsening pancytopenia, diarrhea, and hypomagnesemia. She was placed on IV magnesium and imodium and Lomotil to manage her symptoms. Most recent tandem ring insertion with me was on 07-17-23.                               During most recent follow up with Dr. Bertis Ruddy on 08-14-23, she continues to recover from treatment side effects. She also complained of intermittent hot flashes which she was placed on gabapentin for.    No other significant oncologic interval history since the patient was last seen.    Allergies:  is allergic to pecan nut (diagnostic) and penicillins.  Meds: Current Outpatient Medications  Medication Sig Dispense Refill   aspirin EC 81 MG tablet Take 81 mg by mouth daily.     buPROPion (WELLBUTRIN XL) 150 MG 24 hr tablet Take 300 mg by mouth daily.     Cholecalciferol (VITAMIN D3) 2000 units TABS Take by mouth.     divalproex (DEPAKOTE ER) 250 MG 24 hr tablet Take 750 mg by mouth every morning.     ezetimibe (ZETIA) 10 MG tablet TAKE 1 TABLET BY MOUTH EVERY DAY 90 tablet 3   famotidine (PEPCID) 20 MG tablet TAKE 1 TABLET BY MOUTH TWICE A DAY 180 tablet 3   FLUoxetine (PROZAC) 10 MG capsule Take 10 mg by mouth daily. Takes 2 one time daily  2   gabapentin (NEURONTIN) 300 MG capsule Take 1 capsule (300 mg total) by mouth at bedtime. 30 capsule 3  INGREZZA 60 MG capsule Take 60 mg by mouth daily.     lidocaine-prilocaine (EMLA) cream Apply topically as needed. 30 g 3   linaclotide (LINZESS) 145 MCG CAPS capsule Take 1 capsule (145 mcg total) by mouth daily as needed. 30 capsule 11   multivitamin-iron-minerals-folic acid (CENTRUM) chewable tablet Chew 1 tablet by mouth daily.     MYRBETRIQ 50 MG TB24 tablet TAKE 1 TABLET BY MOUTH EVERY DAY 90 tablet 3   Omega-3 Fatty Acids (FISH OIL) 1000 MG CAPS Take 2 capsules by mouth daily.     ondansetron (ZOFRAN) 8 MG tablet Take 1 tablet (8 mg total) by mouth every 8 (eight) hours as needed for nausea. 30  tablet 3   pravastatin (PRAVACHOL) 40 MG tablet TAKE 1 TABLET BY MOUTH EVERY DAY 90 tablet 3   prochlorperazine (COMPAZINE) 10 MG tablet Take 1 tablet (10 mg total) by mouth every 6 (six) hours as needed for nausea or vomiting. 30 tablet 3   triamcinolone cream (KENALOG) 0.1 % APPLY TO AFFECTED AREA TWICE A DAY 30 g 1   trimethoprim (TRIMPEX) 100 MG tablet Take 1 tablet (100 mg total) by mouth daily. 90 tablet 3   zolpidem (AMBIEN) 5 MG tablet Take 1 tablet (5 mg total) by mouth at bedtime as needed for sleep. 30 tablet 5   No current facility-administered medications for this encounter.    Physical Findings: The patient is in no acute distress. Patient is alert and oriented.  vitals were not taken for this visit. .  No significant changes. Lungs are clear to auscultation bilaterally. Heart has regular rate and rhythm. No palpable cervical, supraclavicular, or axillary adenopathy. Abdomen soft, non-tender, normal bowel sounds.   Lab Findings: Lab Results  Component Value Date   WBC 5.5 08/14/2023   HGB 10.6 (L) 08/14/2023   HCT 31.5 (L) 08/14/2023   MCV 101.9 (H) 08/14/2023   PLT 170 08/14/2023    Radiographic Findings: [place] Intraoperative Result Date: 07/18/2023 CLINICAL DATA:  Ultrasound was provided for use by the ordering physician.  No provider Interpretation or professional fees incurred.     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  ***  Plan:  ***   *** minutes of total time was spent for this patient encounter, including preparation, face-to-face counseling with the patient and coordination of care, physical exam, and documentation of the encounter. ____________________________________  Billie Lade, PhD, MD  This document serves as a record of services personally performed by Antony Blackbird, MD. It was created on his behalf by Herbie Saxon, a trained medical  scribe. The creation of this record is based on the scribe's personal observations and the provider's statements to them. This document has been checked and approved by the attending provider.

## 2023-08-16 ENCOUNTER — Encounter: Payer: Self-pay | Admitting: Radiation Oncology

## 2023-08-16 ENCOUNTER — Ambulatory Visit
Admission: RE | Admit: 2023-08-16 | Discharge: 2023-08-16 | Disposition: A | Discharge status: Home / Self Care | Payer: Medicare Other | Source: Ambulatory Visit | Attending: Radiation Oncology | Admitting: Radiation Oncology

## 2023-08-16 VITALS — BP 101/63 | HR 72 | Temp 97.3°F | Resp 20 | Ht 63.5 in | Wt 152.6 lb

## 2023-08-16 DIAGNOSIS — Z7982 Long term (current) use of aspirin: Secondary | ICD-10-CM | POA: Diagnosis not present

## 2023-08-16 DIAGNOSIS — D61818 Other pancytopenia: Secondary | ICD-10-CM | POA: Insufficient documentation

## 2023-08-16 DIAGNOSIS — C538 Malignant neoplasm of overlapping sites of cervix uteri: Secondary | ICD-10-CM | POA: Diagnosis present

## 2023-08-16 DIAGNOSIS — R197 Diarrhea, unspecified: Secondary | ICD-10-CM | POA: Diagnosis not present

## 2023-08-16 DIAGNOSIS — Z923 Personal history of irradiation: Secondary | ICD-10-CM | POA: Diagnosis not present

## 2023-08-16 DIAGNOSIS — Z79899 Other long term (current) drug therapy: Secondary | ICD-10-CM | POA: Diagnosis not present

## 2023-08-16 HISTORY — DX: Personal history of irradiation: Z92.3

## 2023-08-16 NOTE — Progress Notes (Signed)
 Paula [name] is here today for follow up post radiation to the pelvic.  They completed their radiation on: 07/18/23   Does the patient complain of any of the following:  Pain:No Abdominal bloating: No Diarrhea/Constipation: No Nausea/Vomiting: No Vaginal Discharge: Yes, brown discharge that does not have odor.  Blood in Urine or Stool: No Urinary Issues (dysuria/incomplete emptying/ incontinence/ increased frequency/urgency): No Does patient report using vaginal dilator 2-3 times a week and/or sexually active 2-3 weeks: Patient given S+ and M vaginal dilators with instructions and importance of use. Patient voiced understanding.  Post radiation skin changes: No   Additional comments if applicable:   BP 101/63 (BP Location: Left Arm, Patient Position: Sitting, Cuff Size: Normal)   Pulse 72   Temp (!) 97.3 F (36.3 C)   Resp 20   Ht 5' 3.5" (1.613 m)   Wt 152 lb 9.6 oz (69.2 kg)   SpO2 100%   BMI 26.61 kg/m

## 2023-08-28 DIAGNOSIS — Z1389 Encounter for screening for other disorder: Secondary | ICD-10-CM | POA: Diagnosis not present

## 2023-08-30 ENCOUNTER — Encounter: Payer: Self-pay | Admitting: Internal Medicine

## 2023-08-30 ENCOUNTER — Ambulatory Visit (INDEPENDENT_AMBULATORY_CARE_PROVIDER_SITE_OTHER): Admitting: Internal Medicine

## 2023-08-30 VITALS — BP 120/70 | HR 73 | Temp 97.7°F | Ht 63.5 in | Wt 158.0 lb

## 2023-08-30 DIAGNOSIS — E782 Mixed hyperlipidemia: Secondary | ICD-10-CM | POA: Diagnosis not present

## 2023-08-30 DIAGNOSIS — R7303 Prediabetes: Secondary | ICD-10-CM

## 2023-08-30 DIAGNOSIS — I10 Essential (primary) hypertension: Secondary | ICD-10-CM

## 2023-08-30 DIAGNOSIS — Z0000 Encounter for general adult medical examination without abnormal findings: Secondary | ICD-10-CM | POA: Diagnosis not present

## 2023-08-30 DIAGNOSIS — Z0001 Encounter for general adult medical examination with abnormal findings: Secondary | ICD-10-CM

## 2023-08-30 DIAGNOSIS — N1832 Chronic kidney disease, stage 3b: Secondary | ICD-10-CM | POA: Diagnosis not present

## 2023-08-30 DIAGNOSIS — J449 Chronic obstructive pulmonary disease, unspecified: Secondary | ICD-10-CM | POA: Diagnosis not present

## 2023-08-30 NOTE — Patient Instructions (Signed)
 Please continue all other medications as before, and refills have been done if requested.  Please have the pharmacy call with any other refills you may need.  Please continue your efforts at being more active, low cholesterol diet, and weight control.  You are otherwise up to date with prevention measures today.  Please keep your appointments with your specialists as you may have planned  We can hold on more lab testing for now  Please make an Appointment to return in 6 months, or sooner if needed

## 2023-08-30 NOTE — Progress Notes (Signed)
 Patient ID: Paula [name], female   DOB: [birth year], 78 y.o.   MRN: [id number]         Chief Complaint:: wellness exam and Annual Exam (Wants to know if any of the meds are causing some dizziness and wants to know if she can be taken off some , been off  balance )  , recurrent falls,  htn, hld, preDM       HPI:  Paula [name] is a 78 y.o. female here for wellness exam;  for shingrix at pharmacy, o/w up to date                        Also several falls recently after onset xrt and chemo started per son.  Has PT due to start soon at home.  Ne new meds.  Pt denies chest pain, increased sob or doe, wheezing, orthopnea, PND, increased LE swelling, palpitations, dizziness or syncope.   Pt denies polydipsia, polyuria, or new focal neuro s/s.    Pt denies fever, wt loss, night sweats, loss of appetite, or other constitutional symptoms     Wt Readings from Last 3 Encounters:  08/30/23 158 lb (71.7 kg)  08/16/23 152 lb 9.6 oz (69.2 kg)  08/14/23 151 lb 3.2 oz (68.6 kg)   BP Readings from Last 3 Encounters:  08/30/23 120/70  08/16/23 101/63  08/14/23 122/68   Immunization History  Administered Date(s) Administered   Fluad Quad(high Dose 65+) 03/09/2022   Fluad Trivalent(High Dose 65+) 05/16/2023   Influenza, High Dose Seasonal PF 01/30/2017, 02/06/2017, 03/13/2018, 02/18/2019   Influenza-Unspecified 02/03/2017, 03/04/2020, 03/02/2021   PFIZER(Purple Top)SARS-COV-2 Vaccination 08/11/2019, 09/10/2019, 03/20/2020, 09/21/2020, 03/02/2021   Pneumococcal Conjugate-13 02/12/2017   Pneumococcal Polysaccharide-23 12/02/2018   Tdap 05/25/2017   Health Maintenance Due  Topic Date Due   Zoster Vaccines- Shingrix (1 of 2) Never done   Lung Cancer Screening  Never done   Medicare Annual Wellness (AWV)  03/23/2023      Past Medical History:  Diagnosis Date   Allergy    Ambulates with cane    Arthritis    left knee   Bipolar 1 disorder (HCC)    Follows w/ psychiatry, Dr. Archer Asa.    Cervical cancer (HCC) 2024   Follows w/ [facility], s/p chemo and radiation.   CKD (chronic kidney disease)    stage 3   Colon polyps    COPD (chronic obstructive pulmonary disease) (HCC)    hx of smoking   Depression    GERD (gastroesophageal reflux disease)    Heart murmur    See 11/11/20 echocardiogram in Epic.   History of alcohol abuse    History of chicken pox    History of drug abuse [facility])    Patient states that she quit marijuana in October of 2024.   History of radiation therapy    Pelvis-05/10/23-07/18/23- Dr. Antony Blackbird   Hyperlipidemia    Hypertension    hx of, no longer on meds per pt on 06/27/23   Mitral valve regurgitation    See 11/11/20 echo in Epic.   Pneumonia    history of   Pre-diabetes    controlled with diet per patient   Tardive dyskinesia    Patient is being treated with Ingrezza.   Urine incontinence    sometimes at night per pt   Wears partial dentures    Past Surgical History:  Procedure Laterality Date   APPENDECTOMY  BREAST BIOPSY     CHOLECYSTECTOMY     IR IMAGING GUIDED PORT INSERTION  04/30/2023   LUNG BIOPSY     OPERATIVE ULTRASOUND N/A 06/28/2023   Procedure: OPERATIVE ULTRASOUND;  Surgeon: Antony Blackbird, MD;  Location: [facility];  Service: Urology;  Laterality: N/A;   OPERATIVE ULTRASOUND N/A 07/04/2023   Procedure: OPERATIVE ULTRASOUND;  Surgeon: Antony Blackbird, MD;  Location: [facility];  Service: Urology;  Laterality: N/A;   OPERATIVE ULTRASOUND N/A 07/10/2023   Procedure: OPERATIVE ULTRASOUND;  Surgeon: Antony Blackbird, MD;  Location: [facility];  Service: Urology;  Laterality: N/A;   OPERATIVE ULTRASOUND N/A 07/16/2023   Procedure: OPERATIVE ULTRASOUND;  Surgeon: Antony Blackbird, MD;  Location: [facility];  Service: Urology;  Laterality: N/A;   OPERATIVE ULTRASOUND N/A 07/18/2023   Procedure: OPERATIVE ULTRASOUND;  Surgeon: Antony Blackbird, MD;  Location: [facility];  Service: Urology;  Laterality: N/A;   SUBTOTAL COLECTOMY  2005   [facility], [place]   TANDEM RING INSERTION N/A 06/28/2023   Procedure: TANDEM RING INSERTION;  Surgeon: Antony Blackbird, MD;  Location: [facility];  Service: Urology;  Laterality: N/A;   TANDEM RING INSERTION N/A 07/04/2023   Procedure: TANDEM RING INSERTION;  Surgeon: Antony Blackbird, MD;  Location: [facility];  Service: Urology;  Laterality: N/A;   TANDEM RING INSERTION N/A 07/10/2023   Procedure: TANDEM RING INSERTION;  Surgeon: Antony Blackbird, MD;  Location: [facility];  Service: Urology;  Laterality: N/A;   TANDEM RING INSERTION N/A 07/16/2023   Procedure: TANDEM RING INSERTION;  Surgeon: Antony Blackbird, MD;  Location: [facility];  Service: Urology;  Laterality: N/A;   TANDEM RING INSERTION N/A 07/18/2023   Procedure: TANDEM RING INSERTION;  Surgeon: Antony Blackbird, MD;  Location: [facility];  Service: Urology;  Laterality: N/A;    reports that she quit smoking about 7 years ago. Her smoking use included cigarettes. She started smoking about 62 years ago. She has a 55 pack-year smoking history. She has never used smokeless tobacco. She reports that she does not currently use alcohol after a past usage of about 4.0 - 6.0 standard drinks of alcohol per week. She reports that she does not currently use drugs after having used the following drugs: Marijuana. Frequency: 7.00 times per week. family history includes Arthritis in her father; COPD in her mother; Diabetes in her father; Heart disease in her father, paternal grandmother, and paternal uncle; Hyperlipidemia in her mother; Prostate cancer in her brother. Allergies  Allergen Reactions   Pecan Nut (Diagnostic) Anaphylaxis   Penicillins Other (See Comments)    Has taken and not had reaction, allergist did several tests and told her not to take   Current Outpatient Medications on File Prior to  Visit  Medication Sig Dispense Refill   aspirin EC 81 MG tablet Take 81 mg by mouth daily.     buPROPion (WELLBUTRIN XL) 150 MG 24 hr tablet Take 300 mg by mouth daily.     Cholecalciferol (VITAMIN D3) 2000 units TABS Take by mouth.     divalproex (DEPAKOTE ER) 250 MG 24 hr tablet Take 750 mg by mouth every morning.     ezetimibe (ZETIA) 10 MG tablet TAKE 1 TABLET BY MOUTH EVERY DAY 90 tablet 3   famotidine (PEPCID) 20 MG tablet TAKE 1 TABLET BY MOUTH TWICE A DAY 180 tablet 3   FLUoxetine (PROZAC) 10 MG capsule Take  10 mg by mouth daily. Takes 2 one time daily  2   gabapentin (NEURONTIN) 300 MG capsule Take 1 capsule (300 mg total) by mouth at bedtime. 30 capsule 3   lidocaine-prilocaine (EMLA) cream Apply topically as needed. 30 g 3   linaclotide (LINZESS) 145 MCG CAPS capsule Take 1 capsule (145 mcg total) by mouth daily as needed. 30 capsule 11   multivitamin-iron-minerals-folic acid (CENTRUM) chewable tablet Chew 1 tablet by mouth daily.     MYRBETRIQ 50 MG TB24 tablet TAKE 1 TABLET BY MOUTH EVERY DAY 90 tablet 3   Omega-3 Fatty Acids (FISH OIL) 1000 MG CAPS Take 2 capsules by mouth daily.     ondansetron (ZOFRAN) 8 MG tablet Take 1 tablet (8 mg total) by mouth every 8 (eight) hours as needed for nausea. 30 tablet 3   pravastatin (PRAVACHOL) 40 MG tablet TAKE 1 TABLET BY MOUTH EVERY DAY 90 tablet 3   prochlorperazine (COMPAZINE) 10 MG tablet Take 1 tablet (10 mg total) by mouth every 6 (six) hours as needed for nausea or vomiting. 30 tablet 3   triamcinolone cream (KENALOG) 0.1 % APPLY TO AFFECTED AREA TWICE A DAY 30 g 1   trimethoprim (TRIMPEX) 100 MG tablet Take 1 tablet (100 mg total) by mouth daily. 90 tablet 3   zolpidem (AMBIEN) 5 MG tablet Take 1 tablet (5 mg total) by mouth at bedtime as needed for sleep. 30 tablet 5   No current facility-administered medications on file prior to visit.        ROS:  All others reviewed and negative.  Objective        PE:  BP 120/70 (BP  Location: Right Arm, Patient Position: Sitting, Cuff Size: Normal)   Pulse 73   Temp 97.7 F (36.5 C) (Oral)   Ht 5' 3.5" (1.613 m)   Wt 158 lb (71.7 kg)   SpO2 98%   BMI 27.55 kg/m                 Constitutional: Pt appears in NAD               HENT: Head: NCAT.                Right Ear: External ear normal.                 Left Ear: External ear normal.                Eyes: . Pupils are equal, round, and reactive to light. Conjunctivae and EOM are normal               Nose: without d/c or deformity               Neck: Neck supple. Gross normal ROM               Cardiovascular: Normal rate and regular rhythm.                 Pulmonary/Chest: Effort normal and breath sounds without rales or wheezing.                Abd:  Soft, NT, ND, + BS, no organomegaly               Neurological: Pt is alert. At baseline orientation, motor grossly intact               Skin: Skin is warm. No rashes, no other new lesions, LE edema -  none               Psychiatric: Pt behavior is normal without agitation   Micro: none  Cardiac tracings I have personally interpreted today:  none  Pertinent Radiological findings (summarize): none   Lab Results  Component Value Date   WBC 5.5 08/14/2023   HGB 10.6 (L) 08/14/2023   HCT 31.5 (L) 08/14/2023   PLT 170 08/14/2023   GLUCOSE 95 08/14/2023   CHOL 166 12/18/2022   TRIG 112.0 12/18/2022   HDL 63.60 12/18/2022   LDLDIRECT 73.0 06/16/2021   LDLCALC 80 12/18/2022   ALT 20 08/14/2023   AST 18 08/14/2023   NA 138 08/14/2023   K 4.0 08/14/2023   CL 108 08/14/2023   CREATININE 1.53 (H) 08/14/2023   BUN 17 08/14/2023   CO2 22 08/14/2023   TSH 0.63 01/15/2023   HGBA1C 5.8 12/18/2022   MICROALBUR 0.9 12/14/2020   Assessment/Plan:  Paula [name] is a 78 y.o. White or Caucasian [1] female with  has a past medical history of Allergy, Ambulates with cane, Arthritis, Bipolar 1 disorder (HCC), Cervical cancer (HCC) (2024), CKD (chronic kidney disease),  Colon polyps, COPD (chronic obstructive pulmonary disease) (HCC), Depression, GERD (gastroesophageal reflux disease), Heart murmur, History of alcohol abuse, History of chicken pox, History of drug abuse (HCC), History of radiation therapy, Hyperlipidemia, Hypertension, Mitral valve regurgitation, Pneumonia, Pre-diabetes, Tardive dyskinesia, Urine incontinence, and Wears partial dentures.  Encounter for well adult exam with abnormal findings Age and sex appropriate education and counseling updated with regular exercise and diet Referrals for preventative services - none needed Immunizations addressed - for shingrix at pharmacy Smoking counseling  - none needed Evidence for depression or other mood disorder - stable bipolar Most recent labs reviewed. I have personally reviewed and have noted: 1) the patient's medical and social history 2) The patient's current medications and supplements 3) The patient's height, weight, and BMI have been recorded in the chart   COPD  GOLD 0  Stable, contintue inhaler prn  Essential hypertension BP Readings from Last 3 Encounters:  08/30/23 120/70  08/16/23 101/63  08/14/23 122/68   Stable, pt to continue medical treatment  - diet, wt control   Mixed hyperlipidemia Lab Results  Component Value Date   LDLCALC 80 12/18/2022   uncontrolled, pt to continue current statin  zetia 10 mg every day, pravachol 40 mg every day with good compliance and lower chol diet, declines other change today  CKD (chronic kidney disease) stage 3, GFR 30-59 ml/min (HCC) Lab Results  Component Value Date   CREATININE 1.53 (H) 08/14/2023   Stable overall, cont to avoid nephrotoxins   Pre-diabetes Lab Results  Component Value Date   HGBA1C 5.8 12/18/2022   Stable, pt to continue current medical treatment  - diet, wt control  Followup: Return in about 6 months (around 03/01/2024).  Oliver Barre, MD 09/01/2023 9:21 PM  [facility] -  [facility] Medicine

## 2023-09-01 ENCOUNTER — Encounter: Payer: Self-pay | Admitting: Internal Medicine

## 2023-09-01 NOTE — Assessment & Plan Note (Signed)
Lab Results  Component Value Date   HGBA1C 5.8 12/18/2022   Stable, pt to continue current medical treatment  - diet, wt control

## 2023-09-01 NOTE — Assessment & Plan Note (Signed)
 Stable, contintue inhaler prn

## 2023-09-01 NOTE — Assessment & Plan Note (Addendum)
 Lab Results  Component Value Date   LDLCALC 80 12/18/2022   uncontrolled, pt to continue current statin  zetia 10 mg every day, pravachol 40 mg every day with good compliance and lower chol diet, declines other change today

## 2023-09-01 NOTE — Assessment & Plan Note (Signed)
 Age and sex appropriate education and counseling updated with regular exercise and diet Referrals for preventative services - none needed Immunizations addressed - for shingrix at pharmacy Smoking counseling  - none needed Evidence for depression or other mood disorder - stable bipolar Most recent labs reviewed. I have personally reviewed and have noted: 1) the patient's medical and social history 2) The patient's current medications and supplements 3) The patient's height, weight, and BMI have been recorded in the chart

## 2023-09-01 NOTE — Assessment & Plan Note (Signed)
 Lab Results  Component Value Date   CREATININE 1.53 (H) 08/14/2023   Stable overall, cont to avoid nephrotoxins

## 2023-09-01 NOTE — Assessment & Plan Note (Signed)
 BP Readings from Last 3 Encounters:  08/30/23 120/70  08/16/23 101/63  08/14/23 122/68   Stable, pt to continue medical treatment  - diet, wt control

## 2023-09-06 ENCOUNTER — Telehealth: Payer: Self-pay

## 2023-09-06 ENCOUNTER — Telehealth: Payer: Self-pay | Admitting: *Deleted

## 2023-09-06 NOTE — Telephone Encounter (Signed)
 Talbert Forest from ([facility]) called office.  Pt is scheduled for a follow up on 5/19  at 2:30 with Dr.Newton.  Talbert Forest to notify pt of appointment date and time.

## 2023-09-06 NOTE — Telephone Encounter (Signed)
 Called patient to inform of fu appt. with Dr. Alvester Morin on 10-22-23- arrival time- 2:15 pm, spoke with patient and she is aware of this appt.

## 2023-10-02 ENCOUNTER — Other Ambulatory Visit: Payer: Self-pay | Admitting: Internal Medicine

## 2023-10-02 ENCOUNTER — Other Ambulatory Visit: Payer: Self-pay

## 2023-10-15 ENCOUNTER — Inpatient Hospital Stay: Attending: Psychiatry

## 2023-10-15 ENCOUNTER — Ambulatory Visit (HOSPITAL_COMMUNITY)
Admission: RE | Admit: 2023-10-15 | Discharge: 2023-10-15 | Disposition: A | Discharge status: Home / Self Care | Source: Ambulatory Visit | Attending: Hematology and Oncology | Admitting: Hematology and Oncology

## 2023-10-15 DIAGNOSIS — C538 Malignant neoplasm of overlapping sites of cervix uteri: Secondary | ICD-10-CM | POA: Insufficient documentation

## 2023-10-15 DIAGNOSIS — Z9221 Personal history of antineoplastic chemotherapy: Secondary | ICD-10-CM | POA: Diagnosis not present

## 2023-10-15 DIAGNOSIS — R197 Diarrhea, unspecified: Secondary | ICD-10-CM

## 2023-10-15 DIAGNOSIS — D539 Nutritional anemia, unspecified: Secondary | ICD-10-CM

## 2023-10-15 DIAGNOSIS — Z08 Encounter for follow-up examination after completed treatment for malignant neoplasm: Secondary | ICD-10-CM | POA: Insufficient documentation

## 2023-10-15 DIAGNOSIS — Z8541 Personal history of malignant neoplasm of cervix uteri: Secondary | ICD-10-CM | POA: Insufficient documentation

## 2023-10-15 DIAGNOSIS — Z923 Personal history of irradiation: Secondary | ICD-10-CM | POA: Insufficient documentation

## 2023-10-15 LAB — CBC WITH DIFFERENTIAL/PLATELET
Abs Immature Granulocytes: 0.02 10*3/uL (ref 0.00–0.07)
Basophils Absolute: 0.1 10*3/uL (ref 0.0–0.1)
Basophils Relative: 1 %
Eosinophils Absolute: 0.5 10*3/uL (ref 0.0–0.5)
Eosinophils Relative: 8 %
HCT: 34.9 % — ABNORMAL LOW (ref 36.0–46.0)
Hemoglobin: 11.6 g/dL — ABNORMAL LOW (ref 12.0–15.0)
Immature Granulocytes: 0 %
Lymphocytes Relative: 7 %
Lymphs Abs: 0.4 10*3/uL — ABNORMAL LOW (ref 0.7–4.0)
MCH: 33.6 pg (ref 26.0–34.0)
MCHC: 33.2 g/dL (ref 30.0–36.0)
MCV: 101.2 fL — ABNORMAL HIGH (ref 80.0–100.0)
Monocytes Absolute: 0.5 10*3/uL (ref 0.1–1.0)
Monocytes Relative: 8 %
Neutro Abs: 4.7 10*3/uL (ref 1.7–7.7)
Neutrophils Relative %: 76 %
Platelets: 176 10*3/uL (ref 150–400)
RBC: 3.45 MIL/uL — ABNORMAL LOW (ref 3.87–5.11)
RDW: 11.9 % (ref 11.5–15.5)
WBC: 6.2 10*3/uL (ref 4.0–10.5)
nRBC: 0 % (ref 0.0–0.2)

## 2023-10-15 LAB — COMPREHENSIVE METABOLIC PANEL WITH GFR
ALT: 17 U/L (ref 0–44)
AST: 17 U/L (ref 15–41)
Albumin: 4.2 g/dL (ref 3.5–5.0)
Alkaline Phosphatase: 109 U/L (ref 38–126)
Anion gap: 5 (ref 5–15)
BUN: 33 mg/dL — ABNORMAL HIGH (ref 8–23)
CO2: 24 mmol/L (ref 22–32)
Calcium: 9.7 mg/dL (ref 8.9–10.3)
Chloride: 110 mmol/L (ref 98–111)
Creatinine, Ser: 1.73 mg/dL — ABNORMAL HIGH (ref 0.44–1.00)
GFR, Estimated: 30 mL/min — ABNORMAL LOW (ref >60)
Glucose, Bld: 93 mg/dL (ref 70–99)
Potassium: 4.6 mmol/L (ref 3.5–5.1)
Sodium: 139 mmol/L (ref 135–145)
Total Bilirubin: 0.4 mg/dL (ref 0.0–1.2)
Total Protein: 7 g/dL (ref 6.5–8.1)

## 2023-10-15 LAB — VITAMIN B12: Vitamin B-12: 307 pg/mL (ref 180–914)

## 2023-10-15 LAB — GLUCOSE, CAPILLARY: Glucose-Capillary: 73 mg/dL (ref 70–99)

## 2023-10-15 MED ORDER — FLUDEOXYGLUCOSE F - 18 (FDG) INJECTION
7.9000 | Freq: Once | INTRAVENOUS | Status: AC
  Administered 2023-10-15: 7.98 via INTRAVENOUS

## 2023-10-15 MED ORDER — SODIUM CHLORIDE 0.9% FLUSH
10.0000 mL | Freq: Once | INTRAVENOUS | Status: AC
  Administered 2023-10-15: 10 mL

## 2023-10-16 LAB — FOLATE RBC
Folate, Hemolysate: 492 ng/mL
Folate, RBC: 1355 ng/mL (ref >498)
Hematocrit: 36.3 % (ref 34.0–46.6)

## 2023-10-22 ENCOUNTER — Encounter: Payer: Self-pay | Admitting: Psychiatry

## 2023-10-22 ENCOUNTER — Inpatient Hospital Stay: Admitting: Psychiatry

## 2023-10-22 VITALS — BP 150/74 | HR 66 | Temp 98.7°F | Resp 17 | Ht 63.5 in | Wt 164.0 lb

## 2023-10-22 DIAGNOSIS — Z8541 Personal history of malignant neoplasm of cervix uteri: Secondary | ICD-10-CM | POA: Diagnosis not present

## 2023-10-22 DIAGNOSIS — Z08 Encounter for follow-up examination after completed treatment for malignant neoplasm: Secondary | ICD-10-CM

## 2023-10-22 DIAGNOSIS — Z923 Personal history of irradiation: Secondary | ICD-10-CM | POA: Diagnosis not present

## 2023-10-22 DIAGNOSIS — Z9221 Personal history of antineoplastic chemotherapy: Secondary | ICD-10-CM | POA: Diagnosis not present

## 2023-10-22 DIAGNOSIS — C538 Malignant neoplasm of overlapping sites of cervix uteri: Secondary | ICD-10-CM | POA: Diagnosis not present

## 2023-10-22 DIAGNOSIS — C539 Malignant neoplasm of cervix uteri, unspecified: Secondary | ICD-10-CM

## 2023-10-22 NOTE — Progress Notes (Signed)
 Gynecologic Oncology Return Clinic Visit  Date of Service: 10/22/2023 Referring Provider: Cleone Dad, MD   Assessment & Plan: Paula [name] is a 78 y.o. 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  Cervical cancer: - S/p definitive cisRT and brachy - NED on exam today. - CPS <1% - PET 10/15/23 NED. Repeat in 57mo. Ordered. - Signs/symptoms of recurrence reviewed. - Continue surveillance with follow-up  q40months initially, can alternate with Dr. Eloise Hake. Okay to defer follow-up with Dr. Marton Sleeper tomorrow given that she was seen today. Will work on getting port removed. - Reviewed that after 5 years NED, will be safe to return to Ob/Gyn.   RTC 62mo Dr. Eloise Hake, 57mo Gyn onc  Derrel Flies, MD Gynecologic Oncology   Medical Decision Making I personally spent  TOTAL 20 minutes face-to-face and non-face-to-face in the care of this patient, which includes all pre, intra, and post visit time on the date of service.   ----------------------- Reason for Visit: Surveillance  Treatment History: Oncology History Overview Note  PD-L1 is negative, CPS <1%   Malignant neoplasm of cervix (HCC)  03/08/2023 Initial Diagnosis   Presented to OB/GYN for postmenopausal bleeding. Pap with HSIL, HPV 16 positive   03/26/2023 Initial Biopsy   Cervical biopsy: A. ANTERIOR RIGHT CERVICAL MASS, BIOPSY:       Invasive squamous cell carcinoma, well-differentiated,  keratinizing.    04/05/2023 Cancer Staging   Staging form: Cervix Uteri, AJCC Version 9 - Clinical stage from 04/05/2023: FIGO Stage IIIC1r, calculated as Stage IIIC1 (cT2b, cN1, cM0) - Signed by Derrel Flies, MD on 04/16/2023 Histopathologic type: Squamous cell carcinoma, NOS Stage prefix: Initial diagnosis Stage confirmation method: Imaging Pelvic nodal status: Positive Pelvic nodal method of  assessment: PET Para-aortic status: Negative Para-aortic nodal method of assessment: PET Histologic grade (G): G1 Histologic grading system: 3 grade system   04/05/2023 Imaging   PET: IMPRESSION: 1. The cervical mass is hypermetabolic compatible with primary cervical carcinoma. 2. 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 3. No signs of solid organ metastasis. 4. Possible exophytic lesion off the upper pole of the left kidney measuring 2 cm. Difficult to confirm within the limitations of nondiagnostic CT and unenhanced technique. Consider further evaluation with renal ultrasound. 5. Coronary artery calcifications. 6.  Aortic Atherosclerosis (ICD10-I70.0).   04/30/2023 Procedure   Placement of a subcutaneous power-injectable port device. Catheter tip at the superior cavoatrial junction.   05/09/2023 - 06/14/2023 Chemotherapy   Patient is on Treatment Plan : Cervical cancer Cisplatin  (35) q7d + XRT x 6 cycles     10/15/2023 PET scan   1. No evidence of recurrent or metastatic disease. 2. Bilateral adrenal adenomas. 3. Aortic atherosclerosis (ICD10-I70.0). Coronary artery calcification.     Interval History: Feels like she is finally starting to bounce back from treatment, gaining some weight. Appetite is coming back. No bleeding. Having regular bowel movements. No issues voiding. Using vaginal dilator, 2-3x/week, 5 minutes each time.     Past Medical/Surgical History: Past Medical History:  Diagnosis Date   Allergy    Ambulates with cane    Arthritis    left knee   Bipolar 1 disorder (HCC)    Follows w/ psychiatry, Dr. Alba Ally.   Cervical cancer (HCC) 2024   Follows w/ [facility], s/p chemo and radiation.   CKD (chronic kidney disease)    stage 3  Colon polyps    COPD (chronic obstructive pulmonary disease) (HCC)    hx of smoking   Depression    GERD (gastroesophageal reflux disease)    Heart murmur     See 11/11/20 echocardiogram in Epic.   History of alcohol abuse    History of chicken pox    History of drug abuse [facility])    Patient states that she quit marijuana in October of 2024.   History of radiation therapy    Pelvis-05/10/23-07/18/23- Dr. Retta Caster   Hyperlipidemia    Hypertension    hx of, no longer on meds per pt on 06/27/23   Mitral valve regurgitation    See 11/11/20 echo in Epic.   Pneumonia    history of   Pre-diabetes    controlled with diet per patient   Tardive dyskinesia    Patient is being treated with Ingrezza.   Urine incontinence    sometimes at night per pt   Wears partial dentures     Past Surgical History:  Procedure Laterality Date   APPENDECTOMY     BREAST BIOPSY     CHOLECYSTECTOMY     IR IMAGING GUIDED PORT INSERTION  04/30/2023   LUNG BIOPSY     OPERATIVE ULTRASOUND N/A 06/28/2023   Procedure: OPERATIVE ULTRASOUND;  Surgeon: Retta Caster, MD;  Location: [facility];  Service: Urology;  Laterality: N/A;   OPERATIVE ULTRASOUND N/A 07/04/2023   Procedure: OPERATIVE ULTRASOUND;  Surgeon: Retta Caster, MD;  Location: [facility];  Service: Urology;  Laterality: N/A;   OPERATIVE ULTRASOUND N/A 07/10/2023   Procedure: OPERATIVE ULTRASOUND;  Surgeon: Retta Caster, MD;  Location: [facility];  Service: Urology;  Laterality: N/A;   OPERATIVE ULTRASOUND N/A 07/16/2023   Procedure: OPERATIVE ULTRASOUND;  Surgeon: Retta Caster, MD;  Location: [facility];  Service: Urology;  Laterality: N/A;   OPERATIVE ULTRASOUND N/A 07/18/2023   Procedure: OPERATIVE ULTRASOUND;  Surgeon: Retta Caster, MD;  Location: [facility];  Service: Urology;  Laterality: N/A;   SUBTOTAL COLECTOMY  2005   [place], [place]   TANDEM RING INSERTION N/A 06/28/2023   Procedure: TANDEM RING INSERTION;  Surgeon: Retta Caster, MD;  Location: [facility];  Service: Urology;  Laterality: N/A;   TANDEM RING  INSERTION N/A 07/04/2023   Procedure: TANDEM RING INSERTION;  Surgeon: Retta Caster, MD;  Location: [facility];  Service: Urology;  Laterality: N/A;   TANDEM RING INSERTION N/A 07/10/2023   Procedure: TANDEM RING INSERTION;  Surgeon: Retta Caster, MD;  Location: [facility];  Service: Urology;  Laterality: N/A;   TANDEM RING INSERTION N/A 07/16/2023   Procedure: TANDEM RING INSERTION;  Surgeon: Retta Caster, MD;  Location: [facility];  Service: Urology;  Laterality: N/A;   TANDEM RING INSERTION N/A 07/18/2023   Procedure: TANDEM RING INSERTION;  Surgeon: Retta Caster, MD;  Location: [facility];  Service: Urology;  Laterality: N/A;    Family History  Problem Relation Age of Onset   Hyperlipidemia Mother    COPD Mother        smoked   Arthritis Father    Heart disease Father    Diabetes Father    Prostate cancer Brother    Heart disease Paternal Grandmother    Heart disease Paternal Uncle    Breast cancer Neg Hx    Ovarian cancer Neg Hx    Colon cancer Neg Hx  Endometrial cancer Neg Hx     Social History   Socioeconomic History   Marital status: Single    Spouse name: Not on file   Number of children: 1   Years of education: 14   Highest education level: Not on file  Occupational History   Occupation: Retired Charity fundraiser  Tobacco Use   Smoking status: Former    Current packs/day: 0.00    Average packs/day: 1 pack/day for 55.0 years (55.0 ttl pk-yrs)    Types: Cigarettes    Start date: 08/03/1961    Quit date: 08/03/2016    Years since quitting: 7.2   Smokeless tobacco: Never  Vaping Use   Vaping status: Never Used  Substance and Sexual Activity   Alcohol use: Not Currently    Alcohol/week: 4.0 - 6.0 standard drinks of alcohol    Types: 4 - 6 Cans of beer per week    Comment: Stopped drinking alcohol in October 2024 per patient   Drug use: Not Currently    Frequency: 7.0 times per week    Types: Marijuana     Comment: Stopped in October 2024   Sexual activity: Not Currently    Birth control/protection: Post-menopausal  Other Topics Concern   Not on file  Social History Narrative   Fun/Hobby: Read   Denies abuse and feels safe at home    Social Drivers of Health   Financial Resource Strain: Low Risk  (03/22/2022)   Overall Financial Resource Strain (CARDIA)    Difficulty of Paying Living Expenses: Not hard at all  Food Insecurity: No Food Insecurity (03/22/2023)   Hunger Vital Sign    Worried About Running Out of Food in the Last Year: Never true    Ran Out of Food in the Last Year: Never true  Transportation Needs: No Transportation Needs (03/22/2023)   PRAPARE - Administrator, Civil Service (Medical): No    Lack of Transportation (Non-Medical): No  Physical Activity: Inactive (03/22/2022)   Exercise Vital Sign    Days of Exercise per Week: 0 days    Minutes of Exercise per Session: 0 min  Stress: No Stress Concern Present (03/22/2022)   [employer] of Occupational Health - Occupational Stress Questionnaire    Feeling of Stress : Not at all  Social Connections: Socially Isolated (03/22/2022)   Social Connection and Isolation Panel [NHANES]    Frequency of Communication with Friends and Family: More than three times a week    Frequency of Social Gatherings with Friends and Family: More than three times a week    Attends Religious Services: Never    Database administrator or Organizations: No    Attends Banker Meetings: Never    Marital Status: Divorced    Current Medications:  Current Outpatient Medications:    aspirin EC 81 MG tablet, Take 81 mg by mouth daily., Disp: , Rfl:    buPROPion (WELLBUTRIN XL) 150 MG 24 hr tablet, Take 300 mg by mouth daily., Disp: , Rfl:    Cholecalciferol (VITAMIN D3) 2000 units TABS, Take by mouth., Disp: , Rfl:    ezetimibe  (ZETIA ) 10 MG tablet, TAKE 1 TABLET BY MOUTH EVERY DAY, Disp: 90 tablet, Rfl: 3    FLUoxetine (PROZAC) 10 MG capsule, Take 10 mg by mouth daily. Takes 2 one time daily, Disp: , Rfl: 2   gabapentin  (NEURONTIN ) 300 MG capsule, Take 1 capsule (300 mg total) by mouth at bedtime., Disp: 30 capsule, Rfl: 3  lidocaine -prilocaine  (EMLA ) cream, Apply topically as needed., Disp: 30 g, Rfl: 3   linaclotide  (LINZESS ) 145 MCG CAPS capsule, Take 1 capsule (145 mcg total) by mouth daily as needed., Disp: 30 capsule, Rfl: 11   multivitamin-iron-minerals-folic acid (CENTRUM) chewable tablet, Chew 1 tablet by mouth daily., Disp: , Rfl:    MYRBETRIQ  50 MG TB24 tablet, TAKE 1 TABLET BY MOUTH EVERY DAY, Disp: 90 tablet, Rfl: 3   Omega-3 Fatty Acids (FISH OIL) 1000 MG CAPS, Take 2 capsules by mouth daily., Disp: , Rfl:    ondansetron  (ZOFRAN ) 8 MG tablet, Take 1 tablet (8 mg total) by mouth every 8 (eight) hours as needed for nausea., Disp: 30 tablet, Rfl: 3   pravastatin  (PRAVACHOL ) 40 MG tablet, TAKE 1 TABLET BY MOUTH EVERY DAY, Disp: 90 tablet, Rfl: 3   prochlorperazine  (COMPAZINE ) 10 MG tablet, Take 1 tablet (10 mg total) by mouth every 6 (six) hours as needed for nausea or vomiting., Disp: 30 tablet, Rfl: 3   trimethoprim  (TRIMPEX ) 100 MG tablet, TAKE 1 TABLET BY MOUTH EVERY DAY, Disp: 90 tablet, Rfl: 3   zolpidem  (AMBIEN ) 5 MG tablet, Take 1 tablet (5 mg total) by mouth at bedtime as needed for sleep., Disp: 30 tablet, Rfl: 5   triamcinolone  cream (KENALOG ) 0.1 %, APPLY TO AFFECTED AREA TWICE A DAY (Patient not taking: Reported on 10/17/2023), Disp: 30 g, Rfl: 1  Review of Symptoms: Complete 10-system review is positive for: problems walking  Physical Exam: BP (!) 142/66 (BP Location: Left Arm, Patient Position: Sitting) Comment: CMA notified  Pulse 66   Temp 98.7 F (37.1 C) (Oral)   Resp 17   Ht 5' 3.5" (1.613 m)   Wt 164 lb (74.4 kg)   SpO2 100%   BMI 28.60 kg/m  General: Alert, oriented, no acute distress. HEENT: Normocephalic, atraumatic. Neck symmetric without masses. Sclera  anicteric. Chest: Normal work of breathing. Clear to auscultation bilaterally.  Port site clean. Cardiovascular: Regular rate and rhythm, no murmurs. Abdomen: Soft, nontender.  Normoactive bowel sounds.  No masses appreciated. Extremities: Grossly normal range of motion.  Warm, well perfused.  No edema bilaterally. Skin: No rashes or lesions noted. Lymphatics: No cervical, supraclavicular, or inguinal adenopathy. GU: Normal appearing external genitalia without erythema, excoriation, or lesions.  Speculum exam reveals normal vaginal mucosa, some coaptation of the vaginal apex but portion of cervix visualized without lesion.  Bimanual exam reveals narrowing at vaginal apex but smooth cervix, excellent response to therapy.  Rectovaginal exam negative. Exam chaperoned by Kimberly [place], CMA    Laboratory & Radiologic Studies: NM PET Image Restage (PS) Skull Base to Thigh (F-18 FDG) 10/15/2023  Narrative CLINICAL DATA:  Subsequent treatment strategy for cervical cancer.  EXAM: NUCLEAR MEDICINE PET SKULL BASE TO THIGH  TECHNIQUE: 8.0 mCi F-18 FDG was injected intravenously. Full-ring PET imaging was performed from the skull base to thigh after the radiotracer. CT data was obtained and used for attenuation correction and anatomic localization.  Fasting blood glucose: 73 mg/dl  COMPARISON:  MR abdomen 04/25/2023 and PET 04/05/2023.  FINDINGS: Mediastinal blood pool activity: SUV max 2.7  Liver activity: SUV max NA  NECK:  No abnormal hypermetabolism.  Incidental CT findings:  None.  CHEST:  No abnormal hypermetabolism.  Incidental CT findings:  Right IJ Port-A-Cath terminates in the right atrium. Atherosclerotic calcification of the aorta, aortic valve and coronary arteries. Heart is enlarged. No pericardial or pleural effusion. Postoperative scarring in the right hemithorax. Image quality is degraded by slight  expiratory phase imaging and respiratory motion.  Similar patchy coarsened ground-glass in the upper lobes.  ABDOMEN/PELVIS:  No abnormal hypermetabolism.  Incidental CT findings:  Cholecystectomy. 1.6 cm right adrenal nodule measures 13 Hounsfield units and 2.0 cm left adrenal nodule, 16 Hounsfield units. No specific follow-up necessary. Low-attenuation lesions in kidneys. No specific follow-up necessary. 2.1 cm exophytic lesion off the upper pole left kidney measures 49 Hounsfield units (4/100), characterized as a benign hemorrhagic or proteinaceous cyst on MR abdomen 04/25/2023. No specific follow-up necessary. Right hemicolectomy. Liver, adrenal glands, kidneys, spleen, pancreas, stomach and bowel are otherwise grossly unremarkable.  SKELETON:  No abnormal hypermetabolism.  Incidental CT findings:  Degenerative changes in the spine.  Old bilateral rib fractures.  IMPRESSION: 1. No evidence of recurrent or metastatic disease. 2. Bilateral adrenal adenomas. 3. Aortic atherosclerosis (ICD10-I70.0). Coronary artery calcification.   Electronically Signed By: Shearon Denis M.D. On: 10/15/2023 13:35

## 2023-10-22 NOTE — Assessment & Plan Note (Deleted)
 She was diagnosed with stage III cervical cancer after presentation with postmenopausal bleeding in October 2024 Cervical biopsy revealed squamous cell carcinoma, PD-L1 is negative, CPS <1%  She received concurrent chemoradiation therapy treatment with cisplatin  Last cycle of chemotherapy was completed by January 2025, complicated by hearing changes and pancytopenia Radiation therapy completed by February 2025  PET/CT imaging from May showed no evidence of disease Due to high risk stage III disease, recommend port maintenance for at least 2 years after completion of treatment before removal

## 2023-10-22 NOTE — Patient Instructions (Signed)
 It was a pleasure to see you in clinic today. - Normal exam today - Return visit planned for 3 months with Dr. Eloise Hake, 76mo with Dr. Daisey Dryer  Thank you very much for allowing me to provide care for you today.  I appreciate your confidence in choosing our Gynecologic Oncology team at [facility].  If you have any questions about your visit today please call our office or send us  a MyChart message and we will get back to you as soon as possible.

## 2023-10-23 ENCOUNTER — Ambulatory Visit: Admitting: Hematology and Oncology

## 2023-11-05 ENCOUNTER — Encounter: Payer: Self-pay | Admitting: Internal Medicine

## 2023-11-05 ENCOUNTER — Ambulatory Visit: Payer: Self-pay

## 2023-11-05 NOTE — Telephone Encounter (Signed)
 Copied from CRM [id number]. Topic: Clinical - Red Word Triage >> Nov 05, 2023 12:19 PM Magdalene School wrote: Red Word that prompted transfer to Nurse Triage: Dizzy spells.    Chief Complaint: Dizziness Symptoms: "Spinning" Frequency: a week ago Pertinent Negatives: Patient denies nausea, vomiting, numbness, weakness, headache, earache, loss of consciousness, head injuries Disposition: [] ED /[] Urgent Care (no appt availability in office) / [x] Appointment(In office/virtual)/ []  [place] [facility]/ [] Home Care/ [] Refused Recommended Disposition /[] [facility]/ []  Follow-up with PCP Additional Notes: Patient called and advised that she is having off and on dizzy spells that feel like spinning. She states that she has had this before and had it evaluated before.  She states it was about 1-1.5 years ago and they sent her for an MRI and nothing significant was discovered at that time. Patient denies being on blood thinners. Patient denies any symptoms at this time. She also has her 26 year old granddaughter living with her who she says is very helpful. She denies any nausea, vomiting, pain, numbness, weakness, headache, or ear aches. Patient states that when she has these spells she has fallen.  She states she has fallen twice this week. Patient denies hitting her head and denies any loss of consciousness.  She denies any pain anywhere. Patient states that she uses a walker and yesterday she fell over on the floor getting into the kitchen. She states that she fell on a carpeted floor and did not injure anything. Patient denies any dizziness or lightheadedness or vertigo at this time.  Appointment made for tomorrow 11/06/2023 at 8:30 AM with Dr Oma Bias.  Patient is advised that if anything changes or worsens to go to the Emergency Room. Patient verbalized understanding.  Reason for Disposition  [1] NO dizziness now AND [2] one or more stroke risk factors (i.e., hypertension, diabetes,  prior stroke/TIA/heart attack)  Answer Assessment - Initial Assessment Questions 1. DESCRIPTION: "Describe your dizziness."     spinning 2. VERTIGO: "Do you feel like either you or the room is spinning or tilting?"      spinning 3. LIGHTHEADED: "Do you feel lightheaded?" (e.g., somewhat faint, woozy, weak upon standing)     spinning 4. SEVERITY: "How bad is it?"  "Can you walk?"   - MILD: Feels slightly dizzy and unsteady, but is walking normally.   - MODERATE: Feels unsteady when walking, but not falling; interferes with normal activities (e.g., school, work).   - SEVERE: Unable to walk without falling, or requires assistance to walk without falling.     Dizzy only after being up and walking for a while 5. ONSET:  "When did the dizziness begin?"     About a week ago 6. AGGRAVATING FACTORS: "Does anything make it worse?" (e.g., standing, change in head position)     Walking for a while 7. CAUSE: "What do you think is causing the dizziness?"     Unknown 8. RECURRENT SYMPTOM: "Have you had dizziness before?" If Yes, ask: "When was the last time?" "What happened that time?"     1-1.5 years ago   MRI   Nothing found 9. OTHER SYMPTOMS: "Do you have any other symptoms?" (e.g., headache, weakness, numbness, vomiting, earache)     denies  Protocols used: Dizziness - Vertigo-A-AH

## 2023-11-05 NOTE — Progress Notes (Signed)
 Subjective:    Patient ID: Paula [name], female    DOB: [birth year], 78 y.o.   MRN: [id number]      HPI Paula [name] is here for  Chief Complaint  Patient presents with   Dizziness   She is here with her son who helps augment the history.  She does have some dementia so it is hard to know how reliable her history is.  Intermittent dizziness after standing for a while-  it is a spinning sensation.  She has had intermittent dizziness for couple of years, but this past several months it has gotten more frequent.  She did have an MRI about 2 years ago.  She did see neurology at that time.  She has noticed the dizziness at times when she bends over and then stands back up.  She is also noticed the dizziness a few minutes after she stands up and walks.  It is not as soon as she first gets up.  She has not had the dizziness when she is sitting.  It does not seem to be related to getting in and out of bed, turning her head, rolling over in bed.  She denies any other associated symptoms including chest pain, palpitations, headaches, nausea or blurry vision.  The dizziness can be severe and often but not always she falls with it.  Yesterday she bent over to get something out of the bottom drawer and when she stood up she had the spinning sensation and fell.  After falling the dizziness lasts a couple of seconds and then goes away.  She is not able to get up immediately-has to stay there for a little while and then can get up.   She feels the dizziness occurs approximately once a day.     Medications and allergies reviewed with patient and updated if appropriate.  Current Outpatient Medications on File Prior to Visit  Medication Sig Dispense Refill   aspirin EC 81 MG tablet Take 81 mg by mouth daily.     AUSTEDO XR 42 MG TB24 Take 1 tablet by mouth daily.     buPROPion (WELLBUTRIN XL) 150 MG 24 hr tablet Take 300 mg by mouth daily.     Cholecalciferol (VITAMIN D3) 2000 units TABS Take by  mouth.     divalproex  (DEPAKOTE  ER) 250 MG 24 hr tablet Take by mouth.     estradiol  (ESTRACE ) 0.1 MG/GM vaginal cream Place vaginally.     ezetimibe  (ZETIA ) 10 MG tablet TAKE 1 TABLET BY MOUTH EVERY DAY 90 tablet 3   famotidine  (PEPCID ) 20 MG tablet Take 20 mg by mouth 2 (two) times daily.     FLUoxetine (PROZAC) 10 MG capsule Take 10 mg by mouth daily. Takes 2 one time daily  2   gabapentin  (NEURONTIN ) 300 MG capsule Take 1 capsule (300 mg total) by mouth at bedtime. 30 capsule 3   lidocaine -prilocaine  (EMLA ) cream Apply topically as needed. 30 g 3   linaclotide  (LINZESS ) 145 MCG CAPS capsule Take 1 capsule (145 mcg total) by mouth daily as needed. 30 capsule 11   multivitamin-iron-minerals-folic acid  (CENTRUM) chewable tablet Chew 1 tablet by mouth daily.     MYRBETRIQ  50 MG TB24 tablet TAKE 1 TABLET BY MOUTH EVERY DAY 90 tablet 3   Omega-3 Fatty Acids (FISH OIL) 1000 MG CAPS Take 2 capsules by mouth daily.     ondansetron  (ZOFRAN ) 8 MG tablet Take 1 tablet (8 mg total) by mouth every 8 (eight) hours as  needed for nausea. 30 tablet 3   pravastatin  (PRAVACHOL ) 40 MG tablet TAKE 1 TABLET BY MOUTH EVERY DAY 90 tablet 3   prochlorperazine  (COMPAZINE ) 10 MG tablet Take 1 tablet (10 mg total) by mouth every 6 (six) hours as needed for nausea or vomiting. 30 tablet 3   triamcinolone  cream (KENALOG ) 0.1 % APPLY TO AFFECTED AREA TWICE A DAY 30 g 1   trimethoprim  (TRIMPEX ) 100 MG tablet TAKE 1 TABLET BY MOUTH EVERY DAY 90 tablet 3   zolpidem  (AMBIEN ) 5 MG tablet Take 1 tablet (5 mg total) by mouth at bedtime as needed for sleep. 30 tablet 5   No current facility-administered medications on file prior to visit.    Review of Systems  Constitutional:  Negative for fever.  HENT:  Negative for congestion, ear pain and sinus pain.   Respiratory:  Negative for shortness of breath (mild).   Cardiovascular:  Negative for chest pain and leg swelling.  Gastrointestinal:  Negative for nausea.   Musculoskeletal:  Positive for neck pain.  Neurological:  Positive for dizziness. Negative for weakness, light-headedness, numbness and headaches.       Objective:   Vitals:   11/06/23 0820  BP: 138/78  Pulse: 74  Temp: 98.3 F (36.8 C)  SpO2: 97%   BP Readings from Last 3 Encounters:  11/06/23 138/78  10/22/23 (!) 150/74  08/30/23 120/70   Wt Readings from Last 3 Encounters:  11/06/23 161 lb (73 kg)  10/22/23 164 lb (74.4 kg)  08/30/23 158 lb (71.7 kg)   Body mass index is 28.07 kg/m.    Physical Exam Constitutional:      General: She is not in acute distress.    Appearance: Normal appearance. She is not ill-appearing.  HENT:     Head: Normocephalic and atraumatic.     Right Ear: Tympanic membrane, ear canal and external ear normal. There is no impacted cerumen.     Left Ear: Tympanic membrane, ear canal and external ear normal. There is no impacted cerumen.  Eyes:     Conjunctiva/sclera: Conjunctivae normal.  Cardiovascular:     Rate and Rhythm: Normal rate and regular rhythm.     Heart sounds: Murmur (2/6 sys at rsb) heard.  Pulmonary:     Effort: Pulmonary effort is normal. No respiratory distress.     Breath sounds: Normal breath sounds. No wheezing or rales.  Musculoskeletal:     Cervical back: Neck supple. No tenderness.  Lymphadenopathy:     Cervical: No cervical adenopathy.  Skin:    General: Skin is warm and dry.  Neurological:     Mental Status: She is alert.     Sensory: No sensory deficit.            Assessment & Plan:    Dizziness: Subacute Has been going on for couple of years, but has been more frequent over the last several months-1 year Occurring on a daily basis Occurs when she bends over and stands up or occurs after getting up and walking for a little while-does not occur immediately upon standing or just after standing Dizziness is severe and has resulted in multiple falls No associated symptoms MRI just under 2 years ago  with moderate atrophy, moderate chronic's microvascular ischemic changes in the white matter Has seen neurology about 2 years ago and they are not in favor of neurological reevaluation at this time I history I do not think this is an inner ear issue ?  Cardiac issue-no arrhythmia  or hypotension Advised her caregiver to start monitoring her blood pressure and heart rate and ideally get it if she is symptomatic or just after a fall Referral to cardiology No change in medications    I spent 20 minutes dedicated to the care of this patient on the date of this encounter including review of recent labs, imaging, last neurology evaluation, obtaining history, communicating with the patient and her son, ordering referral and documenting clinical information in the EHR

## 2023-11-06 ENCOUNTER — Ambulatory Visit (INDEPENDENT_AMBULATORY_CARE_PROVIDER_SITE_OTHER): Admitting: Internal Medicine

## 2023-11-06 VITALS — BP 138/78 | HR 74 | Temp 98.3°F | Ht 63.5 in | Wt 161.0 lb

## 2023-11-06 DIAGNOSIS — I10 Essential (primary) hypertension: Secondary | ICD-10-CM

## 2023-11-06 DIAGNOSIS — R42 Dizziness and giddiness: Secondary | ICD-10-CM | POA: Diagnosis not present

## 2023-11-06 NOTE — Patient Instructions (Addendum)
      Try to monitor you BP and heart rate at home, especially is symptomatic.     Medications changes include :   None    A referral was ordered Cardiology and someone will call you to schedule an appointment.     Return if symptoms worsen or fail to improve.

## 2023-11-09 ENCOUNTER — Telehealth: Payer: Self-pay | Admitting: Internal Medicine

## 2023-11-09 DIAGNOSIS — R42 Dizziness and giddiness: Secondary | ICD-10-CM

## 2023-11-09 NOTE — Addendum Note (Signed)
 Addended by: Alanna Alley on: 11/09/2023 03:52 PM   Modules accepted: Orders

## 2023-11-09 NOTE — Telephone Encounter (Signed)
 Patient followed by A LeBauear Seen by Rosalia Colonel   Recentyly seen by Renato Carolus  Falling frequently No syncope but pt is very dizzy    Can't stand on feet Muttiple times per week Can occur with changes in position or just standing   Pt should have 2 wk Zio patch Set up for appt

## 2023-11-09 NOTE — Telephone Encounter (Signed)
 Zio ordered and pt verbalized understanding... will see Dr Lavonne Prairie 11/15/23 at 8:40 am.

## 2023-11-14 NOTE — Progress Notes (Signed)
 Cardiology Office Note   Date:  11/15/2023   ID:  Paula [name], Paula [name] 1945/07/16, MRN [id number]  PCP:  Roslyn Coombe, MD  Cardiologist:   None Referring:  Roslyn Coombe, MD  Chief Complaint  Patient presents with   Dizziness      History of Present Illness: Paula [name] is a 78 y.o. female who presents for evaluation of dizziness and recent falls.  She has no past cardiac history.  She said she has been getting dizzy for about a month or more.  She has had frequent falls for longer than that.  She is feels off balance and then she hits the floor.  She does not lose consciousness by her report.  She has not thankfully had any trauma.  She gets around with a walker in her home.  She has not had any palpitations, presyncope or syncope.  She has not had any prior cardiac history other than she did have an echo in 2022 that demonstrated EF that was well-preserved.  She had some aortic valve calcium but no significant valvular abnormalities.  She denies chest pressure, neck or arm discomfort.  She does not report any shortness of breath, PND or orthopnea.  She says on her wrist blood pressure cuff at home her blood pressure is 100/60.  She was previously taken off send antihypertensives.  She does see physical therapy.  She has not had any other cardiac or neurologic workup.  She drinks 112 ounce beer a day.   Past Medical History:  Diagnosis Date   Allergy    Arthritis    left knee   Bipolar 1 disorder [facility])    Follows w/ psychiatry, Dr. Alba Ally.   Cervical cancer (HCC) 2024   Follows w/ [facility], s/p chemo and radiation.   CKD (chronic kidney disease)    stage 3   Colon polyps    COPD (chronic obstructive pulmonary disease) (HCC)    hx of smoking   Depression    Heart murmur    See 11/11/20 echocardiogram in Epic.   History of alcohol abuse    History of chicken pox    History of drug abuse [facility])    Patient states that she quit marijuana in  October of 2024.   History of radiation therapy    Pelvis-05/10/23-07/18/23- Dr. Retta Caster   Hyperlipidemia    Hypertension    hx of, no longer on meds per pt on 06/27/23   Mitral valve regurgitation    See 11/11/20 echo in Epic.   Pneumonia    history of   Pre-diabetes    controlled with diet per patient   Tardive dyskinesia    Patient is being treated with Ingrezza.    Past Surgical History:  Procedure Laterality Date   APPENDECTOMY     BREAST BIOPSY     CHOLECYSTECTOMY     IR IMAGING GUIDED PORT INSERTION  04/30/2023   LUNG BIOPSY     OPERATIVE ULTRASOUND N/A 06/28/2023   Procedure: OPERATIVE ULTRASOUND;  Surgeon: Retta Caster, MD;  Location: [facility];  Service: Urology;  Laterality: N/A;   OPERATIVE ULTRASOUND N/A 07/04/2023   Procedure: OPERATIVE ULTRASOUND;  Surgeon: Retta Caster, MD;  Location: [facility];  Service: Urology;  Laterality: N/A;   OPERATIVE ULTRASOUND N/A 07/10/2023   Procedure: OPERATIVE ULTRASOUND;  Surgeon: Retta Caster, MD;  Location: [facility];  Service: Urology;  Laterality: N/A;  OPERATIVE ULTRASOUND N/A 07/16/2023   Procedure: OPERATIVE ULTRASOUND;  Surgeon: Retta Caster, MD;  Location: [facility];  Service: Urology;  Laterality: N/A;   OPERATIVE ULTRASOUND N/A 07/18/2023   Procedure: OPERATIVE ULTRASOUND;  Surgeon: Retta Caster, MD;  Location: [facility];  Service: Urology;  Laterality: N/A;   SUBTOTAL COLECTOMY  2005   [place], [place]   TANDEM RING INSERTION N/A 06/28/2023   Procedure: TANDEM RING INSERTION;  Surgeon: Retta Caster, MD;  Location: [facility];  Service: Urology;  Laterality: N/A;   TANDEM RING INSERTION N/A 07/04/2023   Procedure: TANDEM RING INSERTION;  Surgeon: Retta Caster, MD;  Location: [facility];  Service: Urology;  Laterality: N/A;   TANDEM RING INSERTION N/A 07/10/2023   Procedure: TANDEM RING INSERTION;  Surgeon:  Retta Caster, MD;  Location: [facility];  Service: Urology;  Laterality: N/A;   TANDEM RING INSERTION N/A 07/16/2023   Procedure: TANDEM RING INSERTION;  Surgeon: Retta Caster, MD;  Location: [facility];  Service: Urology;  Laterality: N/A;   TANDEM RING INSERTION N/A 07/18/2023   Procedure: TANDEM RING INSERTION;  Surgeon: Retta Caster, MD;  Location: [facility];  Service: Urology;  Laterality: N/A;     Current Outpatient Medications  Medication Sig Dispense Refill   aspirin EC 81 MG tablet Take 81 mg by mouth daily.     AUSTEDO XR 42 MG TB24 Take 1 tablet by mouth daily.     buPROPion (WELLBUTRIN XL) 150 MG 24 hr tablet Take 300 mg by mouth daily.     Cholecalciferol (VITAMIN D3) 2000 units TABS Take by mouth.     divalproex  (DEPAKOTE  ER) 250 MG 24 hr tablet Take by mouth.     ezetimibe  (ZETIA ) 10 MG tablet TAKE 1 TABLET BY MOUTH EVERY DAY 90 tablet 3   famotidine  (PEPCID ) 20 MG tablet Take 20 mg by mouth 2 (two) times daily.     FLUoxetine (PROZAC) 10 MG capsule Take 10 mg by mouth daily. Takes 2 one time daily  2   gabapentin  (NEURONTIN ) 300 MG capsule Take 1 capsule (300 mg total) by mouth at bedtime. 30 capsule 3   lidocaine -prilocaine  (EMLA ) cream Apply topically as needed. 30 g 3   linaclotide  (LINZESS ) 145 MCG CAPS capsule Take 1 capsule (145 mcg total) by mouth daily as needed. 30 capsule 11   multivitamin-iron-minerals-folic acid  (CENTRUM) chewable tablet Chew 1 tablet by mouth daily.     MYRBETRIQ  50 MG TB24 tablet TAKE 1 TABLET BY MOUTH EVERY DAY 90 tablet 3   Omega-3 Fatty Acids (FISH OIL) 1000 MG CAPS Take 2 capsules by mouth daily.     ondansetron  (ZOFRAN ) 8 MG tablet Take 1 tablet (8 mg total) by mouth every 8 (eight) hours as needed for nausea. 30 tablet 3   pravastatin  (PRAVACHOL ) 40 MG tablet TAKE 1 TABLET BY MOUTH EVERY DAY 90 tablet 3   prochlorperazine  (COMPAZINE ) 10 MG tablet Take 1 tablet (10 mg total) by mouth every  6 (six) hours as needed for nausea or vomiting. 30 tablet 3   triamcinolone  cream (KENALOG ) 0.1 % APPLY TO AFFECTED AREA TWICE A DAY 30 g 1   trimethoprim  (TRIMPEX ) 100 MG tablet TAKE 1 TABLET BY MOUTH EVERY DAY 90 tablet 3   zolpidem  (AMBIEN ) 5 MG tablet Take 1 tablet (5 mg total) by mouth at bedtime as needed for sleep. 30 tablet 5   No current facility-administered medications for this  visit.    Allergies:   Pecan nut (diagnostic) and Penicillins    Social History:  The patient  reports that she quit smoking about 7 years ago. Her smoking use included cigarettes. She started smoking about 62 years ago. She has a 55 pack-year smoking history. She has never used smokeless tobacco. She reports that she does not currently use alcohol after a past usage of about 4.0 - 6.0 standard drinks of alcohol per week. She reports that she does not currently use drugs after having used the following drugs: Marijuana. Frequency: 7.00 times per week.   Family History:  The patient's family history includes Arthritis in her father; COPD in her mother; Diabetes in her father; Heart disease in her father, paternal grandmother, and paternal uncle; Hyperlipidemia in her mother; Prostate cancer in her brother.    ROS:  Please see the history of present illness.   Otherwise, review of systems are positive for none.   All other systems are reviewed and negative.    PHYSICAL EXAM: VS:  BP (!) 157/73 (BP Location: Right Arm, Patient Position: Sitting, Cuff Size: Normal)   Pulse 77   Ht 5' 3 (1.6 m)   Wt 165 lb (74.8 kg)   SpO2 98%   BMI 29.23 kg/m  , BMI Body mass index is 29.23 kg/m. GENERAL:  Well appearing HEENT:  Pupils equal round and reactive, fundi not visualized, oral mucosa unremarkable NECK:  No jugular venous distention, waveform within normal limits, carotid upstroke brisk and symmetric, no bruits, no thyromegaly LYMPHATICS:  No cervical, inguinal adenopathy LUNGS:  Clear to auscultation  bilaterally BACK:  No CVA tenderness CHEST:  Unremarkable HEART:  PMI not displaced or sustained,S1 and S2 within normal limits, no S3, no S4, no clicks, no rubs, 2 out of 6 systolic murmur radiating slightly at the aortic outflow tract, no diastolic murmurs ABD:  Flat, positive bowel sounds normal in frequency in pitch, no bruits, no rebound, no guarding, no midline pulsatile mass, no hepatomegaly, no splenomegaly EXT:  2 plus pulses throughout, no edema, no cyanosis no clubbing SKIN:  No rashes no nodules NEURO:  Cranial nerves II through XII grossly intact, motor grossly intact throughout [facility]:  Cognitively intact, oriented to person place and time   EKG:  EKG Interpretation Date/Time:  Thursday November 15 2023 08:51:39 EDT Ventricular Rate:  72 PR Interval:  156 QRS Duration:  86 QT Interval:  394 QTC Calculation: 431 R Axis:   55  Text Interpretation: Normal sinus rhythm No previous ECGs available Confirmed by Eilleen Grates ([id number]) on 11/15/2023 8:52:13 AM    Recent Labs: 01/15/2023: TSH 0.63 08/14/2023: Magnesium  1.6 10/15/2023: ALT 17; BUN 33; Creatinine, Ser 1.73; Hemoglobin 11.6; Platelets 176; Potassium 4.6; Sodium 139    Lipid Panel    Component Value Date/Time   CHOL 166 12/18/2022 1125   TRIG 112.0 12/18/2022 1125   HDL 63.60 12/18/2022 1125   CHOLHDL 3 12/18/2022 1125   VLDL 22.4 12/18/2022 1125   LDLCALC 80 12/18/2022 1125   LDLCALC 82 12/11/2019 1445   LDLDIRECT 73.0 06/16/2021 1423      Wt Readings from Last 3 Encounters:  11/15/23 165 lb (74.8 kg)  11/06/23 161 lb (73 kg)  10/22/23 164 lb (74.4 kg)      Other studies Reviewed: Additional studies/ records that were reviewed today include: Primary care recrods. Review of the above records demonstrates:  Please see elsewhere in the note.     ASSESSMENT AND PLAN:  Dizziness:  She was not orthostatic in the office today.  I will apply a 4-week monitor.  Of note the 1 more recent change to her meds has  been Austedo which can cause dizziness and falls in 4% of patients and is listed as a common reason to reduce the dose.  I did bring this up with her and asked her to talk this over with her psychiatrist.  However, I am not suspecting a primary cardiac etiology.  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  No further workup is suggested.  I did review the echo results from 2022.  CKD: Creatinine is 1.73 can be followed by her primary provider.   Current medicines are reviewed at length with the patient today.  The patient does not have concerns regarding medicines.  The following changes have been made:  no change  Labs/ tests ordered today include:   Orders Placed This Encounter  Procedures   LONG TERM MONITOR (3-14 DAYS)   EKG 12-Lead     Disposition:   FU with with me as needed.      Signed, Eilleen Grates, MD  11/15/2023 12:21 PM    [facility]

## 2023-11-15 ENCOUNTER — Ambulatory Visit

## 2023-11-15 ENCOUNTER — Encounter: Payer: Self-pay | Admitting: Cardiology

## 2023-11-15 ENCOUNTER — Other Ambulatory Visit (HOSPITAL_COMMUNITY): Payer: Self-pay

## 2023-11-15 ENCOUNTER — Ambulatory Visit: Attending: Cardiology | Admitting: Cardiology

## 2023-11-15 VITALS — BP 157/73 | HR 77 | Ht 63.0 in | Wt 165.0 lb

## 2023-11-15 DIAGNOSIS — R42 Dizziness and giddiness: Secondary | ICD-10-CM

## 2023-11-15 DIAGNOSIS — I10 Essential (primary) hypertension: Secondary | ICD-10-CM

## 2023-11-15 NOTE — Progress Notes (Unsigned)
 Applied a 14 day Zio XT monitor to patient in the office ?

## 2023-11-15 NOTE — Patient Instructions (Signed)
 Medication Instructions:  Your physician recommends that you continue on your current medications as directed. Please refer to the Current Medication list given to you today.  *If you need a refill on your cardiac medications before your next appointment, please call your pharmacy*  Lab Work: NONE If you have labs (blood work) drawn today and your tests are completely normal, you will receive your results only by: MyChart Message (if you have MyChart) OR A paper copy in the mail If you have any lab test that is abnormal or we need to change your treatment, we will call you to review the results.  Testing/Procedures: 14 Day Zio Heart Monitor Your physician has requested that you wear a Zio heart monitor for 14 days. This will be mailed to your home with instructions on how to apply the monitor and how to return it when finished. Please allow 2 weeks after returning the heart monitor before our office calls you with the results.   Follow-Up: At [facility], you and your health needs are our priority.  As part of our continuing mission to provide you with exceptional heart care, our providers are all part of one team.  This team includes your primary Cardiologist (physician) and Advanced Practice Providers or APPs (Physician Assistants and Nurse Practitioners) who all work together to provide you with the care you need, when you need it.  Your next appointment:   As needed  Provider:   Lavonne Prairie, MD  We recommend signing up for the patient portal called MyChart.  Sign up information is provided on this After Visit Summary.  MyChart is used to connect with patients for Virtual Visits (Telemedicine).  Patients are able to view lab/test results, encounter notes, upcoming appointments, etc.  Non-urgent messages can be sent to your provider as well.   To learn more about what you can do with MyChart, go to [email or web address].   Other Instructions Get [employer] Blood Pressure  Cuff  Blood pressure diary: Take you blood pressure twice a day for 10 days.    ZIO XT- Long Term Monitor Instructions  Your physician has requested you wear a ZIO patch monitor for 14 days.  This is a single patch monitor. [employer] supplies one patch monitor per enrollment. Additional stickers are not available. Please do not apply patch if you will be having a Nuclear Stress Test,  Echocardiogram, Cardiac CT, MRI, or Chest Xray during the period you would be wearing the  monitor. The patch cannot be worn during these tests. You cannot remove and re-apply the  ZIO XT patch monitor.  Your ZIO patch monitor will be mailed 3 day USPS to your address on file. It may take 3-5 days  to receive your monitor after you have been enrolled.  Once you have received your monitor, please review the enclosed instructions. Your monitor  has already been registered assigning a specific monitor serial # to you.  Billing and Patient Assistance Program Information  We have supplied [employer] with any of your insurance information on file for billing purposes. [employer] offers a sliding scale Patient Assistance Program for patients that do not have  insurance, or whose insurance does not completely cover the cost of the ZIO monitor.  You must apply for the Patient Assistance Program to qualify for this discounted rate.  To apply, please call [employer] at [phone], select option 4, select option 2, ask to apply for  Patient Assistance Program. Sanna Crystal will ask your household income, and how many people  are in your household. They will quote your out-of-pocket cost based on that information.  [employer] will also be able to set up a 52-month, interest-free payment plan if needed.  Applying the monitor   Shave hair from upper left chest.  Hold abrader disc by orange tab. Rub abrader in 40 strokes over the upper left chest as  indicated in your monitor instructions.  Clean area with 4 enclosed alcohol pads. Let  dry.  Apply patch as indicated in monitor instructions. Patch will be placed under collarbone on left  side of chest with arrow pointing upward.  Rub patch adhesive wings for 2 minutes. Remove white label marked 1. Remove the white  label marked 2. Rub patch adhesive wings for 2 additional minutes.  While looking in a mirror, press and release button in center of patch. A small green light will  flash 3-4 times. This will be your only indicator that the monitor has been turned on.  Do not shower for the first 24 hours. You may shower after the first 24 hours.  Press the button if you feel a symptom. You will hear a small click. Record Date, Time and  Symptom in the Patient Logbook.  When you are ready to remove the patch, follow instructions on the last 2 pages of Patient  Logbook. Stick patch monitor onto the last page of Patient Logbook.  Place Patient Logbook in the blue and white box. Use locking tab on box and tape box closed  securely. The blue and white box has prepaid postage on it. Please place it in the mailbox as  soon as possible. Your physician should have your test results approximately 7 days after the  monitor has been mailed back to [facility]  Call [facility] Customer Care at [phone] if you have questions regarding  your ZIO XT patch monitor. Call them immediately if you see an orange light blinking on your  monitor.  If your monitor falls off in less than 4 days, contact our Monitor department at [phone].  If your monitor becomes loose or falls off after 4 days call [employer] at [phone] for  suggestions on securing your monitor

## 2023-11-26 ENCOUNTER — Encounter: Payer: Self-pay | Admitting: Internal Medicine

## 2023-11-26 ENCOUNTER — Ambulatory Visit: Payer: Self-pay | Admitting: Internal Medicine

## 2023-11-26 ENCOUNTER — Ambulatory Visit (INDEPENDENT_AMBULATORY_CARE_PROVIDER_SITE_OTHER): Admitting: Internal Medicine

## 2023-11-26 VITALS — BP 138/78 | HR 70 | Temp 98.4°F | Ht 63.0 in | Wt 161.0 lb

## 2023-11-26 DIAGNOSIS — Z0001 Encounter for general adult medical examination with abnormal findings: Secondary | ICD-10-CM

## 2023-11-26 DIAGNOSIS — Z0000 Encounter for general adult medical examination without abnormal findings: Secondary | ICD-10-CM | POA: Diagnosis not present

## 2023-11-26 DIAGNOSIS — E782 Mixed hyperlipidemia: Secondary | ICD-10-CM

## 2023-11-26 DIAGNOSIS — R7303 Prediabetes: Secondary | ICD-10-CM

## 2023-11-26 DIAGNOSIS — I10 Essential (primary) hypertension: Secondary | ICD-10-CM

## 2023-11-26 DIAGNOSIS — R300 Dysuria: Secondary | ICD-10-CM

## 2023-11-26 DIAGNOSIS — N1832 Chronic kidney disease, stage 3b: Secondary | ICD-10-CM | POA: Diagnosis not present

## 2023-11-26 LAB — URINALYSIS, ROUTINE W REFLEX MICROSCOPIC
Bilirubin Urine: NEGATIVE
Hgb urine dipstick: NEGATIVE
Ketones, ur: NEGATIVE
Nitrite: NEGATIVE
RBC / HPF: NONE SEEN (ref 0–?)
Specific Gravity, Urine: 1.01 (ref 1.000–1.030)
Total Protein, Urine: NEGATIVE
Urine Glucose: NEGATIVE
Urobilinogen, UA: 0.2 (ref 0.0–1.0)
pH: 6 (ref 5.0–8.0)

## 2023-11-26 NOTE — Patient Instructions (Addendum)
 Please continue all other medications as before, and refills have been done if requested.  Please have the pharmacy call with any other refills you may need.  Please continue your efforts at being more active, low cholesterol diet, and weight control.  You are otherwise up to date with prevention measures today.  Please keep your appointments with your specialists as you may have planned  Please go to the LAB at the blood drawing area for the tests to be done - just the urine testing today  You will be contacted by phone if any changes need to be made immediately.  Otherwise, you will receive a letter about your results with an explanation, but please check with MyChart first.  Please consider follow up with Sports Medicine if the left knee gets more unstable and you are falling more  Please make an Appointment to return in 6 months after Jun 05 2024, or sooner if needed

## 2023-11-26 NOTE — Progress Notes (Unsigned)
 Patient ID: Paula [name], female   DOB: 1945-11-18, 78 y.o.   MRN: [id number]         Chief Complaint:: wellness exam and dysuria, left knee arthritis with recent fall x 1 despite walker and recent PT, copd, preDM, hld, htn       HPI:  Paula [name] is a 78 y.o. female here for wellness exam; declines LDCT screening referral, o/w up to date                        Also c/o recent weakness, fall and dysuria for 3 days, but Denies urinary symptoms such as frequency, urgency, flank pain, hematuria or n/v, fever, chills. Aking for UA today.  Pt denies chest pain, increased sob or doe, wheezing, orthopnea, PND, increased LE swelling, palpitations, dizziness or syncope.   Pt denies polydipsia, polyuria, or new focal neuro s/s.   Has ongoing end stage left knee DJD but declines ortho or any surgury.     Wt Readings from Last 3 Encounters:  11/26/23 161 lb (73 kg)  11/15/23 165 lb (74.8 kg)  11/06/23 161 lb (73 kg)   BP Readings from Last 3 Encounters:  11/26/23 138/78  11/15/23 (!) 157/73  11/06/23 138/78   Immunization History  Administered Date(s) Administered   Fluad Quad(high Dose 65+) 03/09/2022   Fluad Trivalent(High Dose 65+) 05/16/2023   Influenza, High Dose Seasonal PF 01/30/2017, 02/06/2017, 03/13/2018, 02/18/2019   Influenza-Unspecified 02/03/2017, 03/04/2020, 03/02/2021   PFIZER(Purple Top)SARS-COV-2 Vaccination 08/11/2019, 09/10/2019, 03/20/2020, 09/21/2020, 03/02/2021   Pneumococcal Conjugate-13 02/12/2017   Pneumococcal Polysaccharide-23 12/02/2018   Tdap 05/25/2017   Health Maintenance Due  Topic Date Due   Medicare Annual Wellness (AWV)  03/23/2023      Past Medical History:  Diagnosis Date   Allergy    Arthritis    left knee   Bipolar 1 disorder (HCC)    Follows w/ psychiatry, Dr. Elna Lo.   Cervical cancer (HCC) 2024   Follows w/ [facility], s/p chemo and radiation.   CKD (chronic kidney disease)    stage 3   Colon polyps    COPD  (chronic obstructive pulmonary disease) (HCC)    hx of smoking   Depression    Heart murmur    See 11/11/20 echocardiogram in Epic.   History of alcohol abuse    History of chicken pox    History of drug abuse [facility])    Patient states that she quit marijuana in October of 2024.   History of radiation therapy    Pelvis-05/10/23-07/18/23- Dr. Lynwood Nasuti   Hyperlipidemia    Hypertension    hx of, no longer on meds per pt on 06/27/23   Mitral valve regurgitation    See 11/11/20 echo in Epic.   Pneumonia    history of   Pre-diabetes    controlled with diet per patient   Tardive dyskinesia    Patient is being treated with Ingrezza.   Past Surgical History:  Procedure Laterality Date   APPENDECTOMY     BREAST BIOPSY     CHOLECYSTECTOMY     IR IMAGING GUIDED PORT INSERTION  04/30/2023   LUNG BIOPSY     OPERATIVE ULTRASOUND N/A 06/28/2023   Procedure: OPERATIVE ULTRASOUND;  Surgeon: Nasuti Lynwood, MD;  Location: [facility];  Service: Urology;  Laterality: N/A;   OPERATIVE ULTRASOUND N/A 07/04/2023   Procedure: OPERATIVE ULTRASOUND;  Surgeon: Nasuti Lynwood, MD;  Location: [facility];  Service: Urology;  Laterality: N/A;   OPERATIVE ULTRASOUND N/A 07/10/2023   Procedure: OPERATIVE ULTRASOUND;  Surgeon: [name] Agent, MD;  Location: [facility];  Service: Urology;  Laterality: N/A;   OPERATIVE ULTRASOUND N/A 07/16/2023   Procedure: OPERATIVE ULTRASOUND;  Surgeon: [name] Agent, MD;  Location: [facility];  Service: Urology;  Laterality: N/A;   OPERATIVE ULTRASOUND N/A 07/18/2023   Procedure: OPERATIVE ULTRASOUND;  Surgeon: [name] Agent, MD;  Location: [facility];  Service: Urology;  Laterality: N/A;   SUBTOTAL COLECTOMY  2005   [facility], [place]   TANDEM RING INSERTION N/A 06/28/2023   Procedure: TANDEM RING INSERTION;  Surgeon: [name] Agent, MD;  Location: [facility];  Service: Urology;  Laterality: N/A;    TANDEM RING INSERTION N/A 07/04/2023   Procedure: TANDEM RING INSERTION;  Surgeon: [name] Agent, MD;  Location: [facility];  Service: Urology;  Laterality: N/A;   TANDEM RING INSERTION N/A 07/10/2023   Procedure: TANDEM RING INSERTION;  Surgeon: [name] Agent, MD;  Location: [facility];  Service: Urology;  Laterality: N/A;   TANDEM RING INSERTION N/A 07/16/2023   Procedure: TANDEM RING INSERTION;  Surgeon: [name] Agent, MD;  Location: [facility];  Service: Urology;  Laterality: N/A;   TANDEM RING INSERTION N/A 07/18/2023   Procedure: TANDEM RING INSERTION;  Surgeon: [name] Agent, MD;  Location: [facility];  Service: Urology;  Laterality: N/A;    reports that she quit smoking about 7 years ago. Her smoking use included cigarettes. She started smoking about 62 years ago. She has a 55 pack-year smoking history. She has never used smokeless tobacco. She reports that she does not currently use alcohol after a past usage of about 4.0 - 6.0 standard drinks of alcohol per week. She reports that she does not currently use drugs after having used the following drugs: Marijuana. Frequency: 7.00 times per week. family history includes Arthritis in her father; COPD in her mother; Diabetes in her father; Heart disease in her father, paternal grandmother, and paternal uncle; Hyperlipidemia in her mother; Prostate cancer in her brother. Allergies  Allergen Reactions   Pecan Nut (Diagnostic) Anaphylaxis   Penicillins Other (See Comments)    Has taken and not had reaction, allergist did several tests and told her not to take   Current Outpatient Medications on File Prior to Visit  Medication Sig Dispense Refill   aspirin EC 81 MG tablet Take 81 mg by mouth daily.     AUSTEDO XR 42 MG TB24 Take 1 tablet by mouth daily.     buPROPion (WELLBUTRIN XL) 150 MG 24 hr tablet Take 300 mg by mouth daily.     Cholecalciferol (VITAMIN D3) 2000 units TABS Take  by mouth.     divalproex  (DEPAKOTE  ER) 250 MG 24 hr tablet Take by mouth.     ezetimibe  (ZETIA ) 10 MG tablet TAKE 1 TABLET BY MOUTH EVERY DAY 90 tablet 3   famotidine  (PEPCID ) 20 MG tablet Take 20 mg by mouth 2 (two) times daily.     FLUoxetine (PROZAC) 10 MG capsule Take 10 mg by mouth daily. Takes 2 one time daily  2   gabapentin  (NEURONTIN ) 300 MG capsule Take 1 capsule (300 mg total) by mouth at bedtime. 30 capsule 3   lidocaine -prilocaine  (EMLA ) cream Apply topically as needed. 30 g 3   linaclotide  (LINZESS ) 145 MCG CAPS capsule Take 1 capsule (145 mcg total) by mouth  daily as needed. 30 capsule 11   multivitamin-iron-minerals-folic acid  (CENTRUM) chewable tablet Chew 1 tablet by mouth daily.     MYRBETRIQ  50 MG TB24 tablet TAKE 1 TABLET BY MOUTH EVERY DAY 90 tablet 3   Omega-3 Fatty Acids (FISH OIL) 1000 MG CAPS Take 2 capsules by mouth daily.     ondansetron  (ZOFRAN ) 8 MG tablet Take 1 tablet (8 mg total) by mouth every 8 (eight) hours as needed for nausea. 30 tablet 3   pravastatin  (PRAVACHOL ) 40 MG tablet TAKE 1 TABLET BY MOUTH EVERY DAY 90 tablet 3   prochlorperazine  (COMPAZINE ) 10 MG tablet Take 1 tablet (10 mg total) by mouth every 6 (six) hours as needed for nausea or vomiting. 30 tablet 3   triamcinolone  cream (KENALOG ) 0.1 % APPLY TO AFFECTED AREA TWICE A DAY 30 g 1   trimethoprim  (TRIMPEX ) 100 MG tablet TAKE 1 TABLET BY MOUTH EVERY DAY 90 tablet 3   zolpidem  (AMBIEN ) 5 MG tablet Take 1 tablet (5 mg total) by mouth at bedtime as needed for sleep. 30 tablet 5   No current facility-administered medications on file prior to visit.        ROS:  All others reviewed and negative.  Objective        PE:  BP 138/78 (BP Location: Right Arm, Patient Position: Sitting, Cuff Size: Normal)   Pulse 70   Temp 98.4 F (36.9 C) (Oral)   Ht 5' 3 (1.6 m)   Wt 161 lb (73 kg)   SpO2 96%   BMI 28.52 kg/m                 Constitutional: Pt appears in NAD               HENT: Head: NCAT.                 Right Ear: External ear normal.                 Left Ear: External ear normal.                Eyes: . Pupils are equal, round, and reactive to light. Conjunctivae and EOM are normal               Nose: without d/c or deformity               Neck: Neck supple. Gross normal ROM               Cardiovascular: Normal rate and regular rhythm.                 Pulmonary/Chest: Effort normal and breath sounds without rales or wheezing.                Abd:  Soft, NT, ND, + BS, no organomegaly               Neurological: Pt is alert. At baseline orientation, motor grossly intact               Skin: Skin is warm. No rashes, no other new lesions, LE edema - trace edema bilateral               Psychiatric: Pt behavior is normal without agitation   Micro: none  Cardiac tracings I have personally interpreted today:  none  Pertinent Radiological findings (summarize): none   Lab Results  Component Value Date   WBC 6.2 10/15/2023   HGB 11.6 (L)  10/15/2023   HCT 36.3 10/15/2023   HCT 34.9 (L) 10/15/2023   PLT 176 10/15/2023   GLUCOSE 93 10/15/2023   CHOL 166 12/18/2022   TRIG 112.0 12/18/2022   HDL 63.60 12/18/2022   LDLDIRECT 73.0 06/16/2021   LDLCALC 80 12/18/2022   ALT 17 10/15/2023   AST 17 10/15/2023   NA 139 10/15/2023   K 4.6 10/15/2023   CL 110 10/15/2023   CREATININE 1.73 (H) 10/15/2023   BUN 33 (H) 10/15/2023   CO2 24 10/15/2023   TSH 0.63 01/15/2023   HGBA1C 5.8 12/18/2022   MICROALBUR 0.9 12/14/2020   Assessment/Plan:  Paula [name] is a 78 y.o. White or Caucasian [1] female with  has a past medical history of Allergy, Arthritis, Bipolar 1 disorder (HCC), Cervical cancer (HCC) (2024), CKD (chronic kidney disease), Colon polyps, COPD (chronic obstructive pulmonary disease) (HCC), Depression, Heart murmur, History of alcohol abuse, History of chicken pox, History of drug abuse (HCC), History of radiation therapy, Hyperlipidemia, Hypertension, Mitral valve  regurgitation, Pneumonia, Pre-diabetes, and Tardive dyskinesia.  Encounter for well adult exam with abnormal findings Age and sex appropriate education and counseling updated with regular exercise and diet Referrals for preventative services - declines LDCT screening fo rnow due to age Immunizations addressed - none needed Smoking counseling  - none needed Evidence for depression or other mood disorder - none significant Most recent labs reviewed. I have personally reviewed and have noted: 1) the patient's medical and social history 2) The patient's current medications and supplements 3) The patient's height, weight, and BMI have been recorded in the chart   Pre-diabetes Lab Results  Component Value Date   HGBA1C 5.8 12/18/2022   Stable, pt to continue current medical treatment  - diet, wt control   Mixed hyperlipidemia Lab Results  Component Value Date   LDLCALC 80 12/18/2022   Stable, pt to continue current statin pravachol  40 mg every day, for f/u lab   Essential hypertension BP Readings from Last 3 Encounters:  11/26/23 138/78  11/15/23 (!) 157/73  11/06/23 138/78   Stable, pt to continue medical treatment  - diet, wt control   CKD (chronic kidney disease) stage 3, GFR 30-59 ml/min (HCC) Lab Results  Component Value Date   CREATININE 1.73 (H) 10/15/2023   Worsening mod to severe, pt to cont to avoid nephrotoxins, declines renal referral for now   Dysuria New onset, can't r/o uti - for ua and culture Followup: Return in 28 weeks (on 06/09/2024), or if symptoms worsen or fail to improve.  Lynwood Rush, MD 11/27/2023 1:23 PM [facility]

## 2023-11-27 ENCOUNTER — Encounter: Payer: Self-pay | Admitting: Internal Medicine

## 2023-11-27 DIAGNOSIS — R300 Dysuria: Secondary | ICD-10-CM | POA: Insufficient documentation

## 2023-11-27 LAB — URINE CULTURE: Result:: NO GROWTH

## 2023-11-27 NOTE — Assessment & Plan Note (Signed)
 Lab Results  Component Value Date   LDLCALC 80 12/18/2022   Stable, pt to continue current statin pravachol  40 mg every day, for f/u lab

## 2023-11-27 NOTE — Assessment & Plan Note (Signed)
Lab Results  Component Value Date   HGBA1C 5.8 12/18/2022   Stable, pt to continue current medical treatment  - diet, wt control

## 2023-11-27 NOTE — Assessment & Plan Note (Signed)
 Age and sex appropriate education and counseling updated with regular exercise and diet Referrals for preventative services - declines LDCT screening fo rnow due to age Immunizations addressed - none needed Smoking counseling  - none needed Evidence for depression or other mood disorder - none significant Most recent labs reviewed. I have personally reviewed and have noted: 1) the patient's medical and social history 2) The patient's current medications and supplements 3) The patient's height, weight, and BMI have been recorded in the chart

## 2023-11-27 NOTE — Assessment & Plan Note (Signed)
 New onset, can't r/o uti - for ua and culture

## 2023-11-27 NOTE — Assessment & Plan Note (Addendum)
 Lab Results  Component Value Date   CREATININE 1.73 (H) 10/15/2023   Worsening mod to severe, pt to cont to avoid nephrotoxins, declines renal referral for now

## 2023-11-27 NOTE — Assessment & Plan Note (Signed)
 BP Readings from Last 3 Encounters:  11/26/23 138/78  11/15/23 (!) 157/73  11/06/23 138/78   Stable, pt to continue medical treatment  - diet, wt control

## 2023-12-03 ENCOUNTER — Other Ambulatory Visit: Payer: Self-pay | Admitting: Hematology and Oncology

## 2023-12-04 ENCOUNTER — Ambulatory Visit: Admitting: Hematology and Oncology

## 2023-12-04 ENCOUNTER — Other Ambulatory Visit

## 2023-12-05 ENCOUNTER — Other Ambulatory Visit

## 2023-12-06 ENCOUNTER — Inpatient Hospital Stay: Attending: Psychiatry | Admitting: Hematology and Oncology

## 2023-12-06 ENCOUNTER — Inpatient Hospital Stay

## 2023-12-06 ENCOUNTER — Encounter: Payer: Self-pay | Admitting: Hematology and Oncology

## 2023-12-06 VITALS — BP 151/67 | HR 78 | Temp 98.1°F | Resp 20 | Wt 164.2 lb

## 2023-12-06 DIAGNOSIS — D696 Thrombocytopenia, unspecified: Secondary | ICD-10-CM | POA: Diagnosis not present

## 2023-12-06 DIAGNOSIS — Z923 Personal history of irradiation: Secondary | ICD-10-CM | POA: Diagnosis not present

## 2023-12-06 DIAGNOSIS — Z8541 Personal history of malignant neoplasm of cervix uteri: Secondary | ICD-10-CM | POA: Diagnosis not present

## 2023-12-06 DIAGNOSIS — C538 Malignant neoplasm of overlapping sites of cervix uteri: Secondary | ICD-10-CM | POA: Diagnosis present

## 2023-12-06 DIAGNOSIS — R197 Diarrhea, unspecified: Secondary | ICD-10-CM

## 2023-12-06 LAB — CBC WITH DIFFERENTIAL/PLATELET
Abs Immature Granulocytes: 0.01 10*3/uL (ref 0.00–0.07)
Basophils Absolute: 0.1 10*3/uL (ref 0.0–0.1)
Basophils Relative: 1 %
Eosinophils Absolute: 0.4 10*3/uL (ref 0.0–0.5)
Eosinophils Relative: 10 %
HCT: 36.3 % (ref 36.0–46.0)
Hemoglobin: 12.1 g/dL (ref 12.0–15.0)
Immature Granulocytes: 0 %
Lymphocytes Relative: 12 %
Lymphs Abs: 0.5 10*3/uL — ABNORMAL LOW (ref 0.7–4.0)
MCH: 33.2 pg (ref 26.0–34.0)
MCHC: 33.3 g/dL (ref 30.0–36.0)
MCV: 99.7 fL (ref 80.0–100.0)
Monocytes Absolute: 0.5 10*3/uL (ref 0.1–1.0)
Monocytes Relative: 10 %
Neutro Abs: 3.1 10*3/uL (ref 1.7–7.7)
Neutrophils Relative %: 67 %
Platelets: 134 10*3/uL — ABNORMAL LOW (ref 150–400)
RBC: 3.64 MIL/uL — ABNORMAL LOW (ref 3.87–5.11)
RDW: 12.3 % (ref 11.5–15.5)
WBC: 4.6 10*3/uL (ref 4.0–10.5)
nRBC: 0 % (ref 0.0–0.2)

## 2023-12-06 LAB — COMPREHENSIVE METABOLIC PANEL WITH GFR
ALT: 13 U/L (ref 0–44)
AST: 13 U/L — ABNORMAL LOW (ref 15–41)
Albumin: 3.9 g/dL (ref 3.5–5.0)
Alkaline Phosphatase: 84 U/L (ref 38–126)
Anion gap: 7 (ref 5–15)
BUN: 29 mg/dL — ABNORMAL HIGH (ref 8–23)
CO2: 28 mmol/L (ref 22–32)
Calcium: 9.6 mg/dL (ref 8.9–10.3)
Chloride: 106 mmol/L (ref 98–111)
Creatinine, Ser: 1.63 mg/dL — ABNORMAL HIGH (ref 0.44–1.00)
GFR, Estimated: 32 mL/min — ABNORMAL LOW (ref >60)
Glucose, Bld: 119 mg/dL — ABNORMAL HIGH (ref 70–99)
Potassium: 4.7 mmol/L (ref 3.5–5.1)
Sodium: 141 mmol/L (ref 135–145)
Total Bilirubin: 0.4 mg/dL (ref 0.0–1.2)
Total Protein: 6.6 g/dL (ref 6.5–8.1)

## 2023-12-06 MED ORDER — HEPARIN SOD (PORK) LOCK FLUSH 100 UNIT/ML IV SOLN
500.0000 [IU] | Freq: Once | INTRAVENOUS | Status: AC
  Administered 2023-12-06: 500 [IU]

## 2023-12-06 MED ORDER — SODIUM CHLORIDE 0.9% FLUSH
10.0000 mL | Freq: Once | INTRAVENOUS | Status: AC
  Administered 2023-12-06: 10 mL

## 2023-12-06 NOTE — Progress Notes (Signed)
 [facility] OFFICE PROGRESS NOTE  Patient Care Team: [name] ORN, MD as PCP - General (Internal Medicine) Patrcia Sharper, MD as Consulting Physician (Ophthalmology) Dallie Vera GAILS, MD as Consulting Physician (Gynecology)  Assessment & Plan Malignant neoplasm of overlapping sites of cervix [facility]) She was diagnosed with stage III cervical cancer after presentation with postmenopausal bleeding in October 2024 Cervical biopsy revealed squamous cell carcinoma, PD-L1 is negative, CPS <1%  She received concurrent chemoradiation therapy treatment with cisplatin  Last cycle of chemotherapy was completed by January 2025, complicated by hearing changes and pancytopenia Radiation therapy completed by February 2025 PET/CT imaging from May showed complete response Another follow-up imaging study is planned for November We will continue port maintenance every other month until her next imaging study If next imaging study showed no evidence of disease, I recommend port removal Thrombocytopenia (HCC) 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    Almarie Bedford, MD  INTERVAL HISTORY: she returns for surveillance follow-up for history of cervical cancer She is here accompanied by her son She has occasional dizziness and recurrent falls, undergoing physical therapy She drinks 1 beer daily Denies recent vaginal bleeding or discharge  PHYSICAL EXAMINATION: ECOG PERFORMANCE STATUS: 1 - Symptomatic but completely ambulatory  Vitals:   12/06/23 1047 12/06/23 1048  BP: (!) 161/60 (!) 151/67  Pulse: 78   Resp: 20   Temp: 98.1 F (36.7 C)   SpO2: 99%    Filed Weights   12/06/23 1047  Weight: 164 lb 3.2 oz (74.5 kg)    Relevant data reviewed during this visit included CBC and CMP

## 2023-12-06 NOTE — Assessment & Plan Note (Addendum)
 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

## 2023-12-06 NOTE — Assessment & Plan Note (Addendum)
 She was diagnosed with stage III cervical cancer after presentation with postmenopausal bleeding in October 2024 Cervical biopsy revealed squamous cell carcinoma, PD-L1 is negative, CPS <1%  She received concurrent chemoradiation therapy treatment with cisplatin  Last cycle of chemotherapy was completed by January 2025, complicated by hearing changes and pancytopenia Radiation therapy completed by February 2025 PET/CT imaging from May showed complete response Another follow-up imaging study is planned for November We will continue port maintenance every other month until her next imaging study If next imaging study showed no evidence of disease, I recommend port removal

## 2023-12-07 DIAGNOSIS — R42 Dizziness and giddiness: Secondary | ICD-10-CM | POA: Diagnosis not present

## 2023-12-14 ENCOUNTER — Ambulatory Visit: Payer: Self-pay | Admitting: Cardiology

## 2023-12-14 DIAGNOSIS — R42 Dizziness and giddiness: Secondary | ICD-10-CM | POA: Diagnosis not present

## 2023-12-21 ENCOUNTER — Ambulatory Visit

## 2023-12-21 VITALS — Ht 63.0 in | Wt 164.0 lb

## 2023-12-21 DIAGNOSIS — Z0000 Encounter for general adult medical examination without abnormal findings: Secondary | ICD-10-CM | POA: Diagnosis not present

## 2023-12-21 NOTE — Patient Instructions (Addendum)
 Paula [name] , Thank you for taking time out of your busy schedule to complete your Annual Wellness Visit with me. I enjoyed our conversation and look forward to speaking with you again next year. I, as well as your care team,  appreciate your ongoing commitment to your health goals. Please review the following plan we discussed and let me know if I can assist you in the future. Your Game plan/ To Do List    Follow up Visits: Next Medicare AWV with our clinical staff: 12/23/2024.   Have you seen your provider in the last 6 months (3 months if uncontrolled diabetes)? Yes Next Office Visit with your provider: 05/27/2024.  Last office visit was 11/26/2023.  Clinician Recommendations:  Aim for 30 minutes of exercise or brisk walking, 6-8 glasses of water , and 5 servings of fruits and vegetables each day.       This is a list of the screening recommended for you and due dates:  Health Maintenance  Topic Date Due   Screening for Lung Cancer  11/25/2024*   Flu Shot  01/04/2024   Medicare Annual Wellness Visit  12/20/2024   DTaP/Tdap/Td vaccine (2 - Td or Tdap) 05/26/2027   Pneumococcal Vaccine for age over 71  Completed   DEXA scan (bone density measurement)  Completed   Hepatitis C Screening  Completed   Hepatitis B Vaccine  Aged Out   HPV Vaccine  Aged Out   Meningitis B Vaccine  Aged Out   COVID-19 Vaccine  Discontinued   Zoster (Shingles) Vaccine  Discontinued  *Topic was postponed. The date shown is not the original due date.    Advanced directives: (Copy Requested) Please bring a copy of your health care power of attorney and living will to the office to be added to your chart at your convenience. You can mail to [facility] [address] [place], [place] [address] or email to [email or web address] Advance Care Planning is important because it:  [x]  Makes sure you receive the medical care that is consistent with your values, goals, and preferences  [x]  It provides  guidance to your family and loved ones and reduces their decisional burden about whether or not they are making the right decisions based on your wishes.  Follow the link provided in your after visit summary or read over the paperwork we have mailed to you to help you started getting your Advance Directives in place. If you need assistance in completing these, please reach out to us  so that we can help you!  See attachments for Preventive Care and Fall Prevention Tips.

## 2023-12-21 NOTE — Progress Notes (Signed)
 Subjective:   Paula [name] is a 78 y.o. who presents for a Medicare Wellness preventive visit.  As a reminder, Annual Wellness Visits don't include a physical exam, and some assessments may be limited, especially if this visit is performed virtually. We may recommend an in-person follow-up visit with your provider if needed.  Visit Complete: Virtual I connected with  Paula [name] on 12/21/23 by a audio enabled telemedicine application and verified that I am speaking with the correct person using two identifiers.  Patient Location: Home  Provider Location: Office/Clinic  I discussed the limitations of evaluation and management by telemedicine. The patient expressed understanding and agreed to proceed.  Vital Signs: Because this visit was a virtual/telehealth visit, some criteria may be missing or patient reported. Any vitals not documented were not able to be obtained and vitals that have been documented are patient reported.  VideoDeclined- This patient declined Librarian, academic. Therefore the visit was completed with audio only.  Persons Participating in Visit: Patient.  AWV Questionnaire: No: Patient Medicare AWV questionnaire was not completed prior to this visit.  Cardiac Risk Factors include: hypertension;Other (see comment);dyslipidemia, Risk factor comments: COPD GOLD, DI{, CKD stage3,     Objective:    Today's Vitals   12/21/23 0928  Weight: 164 lb (74.4 kg)  Height: 5' 3 (1.6 m)   Body mass index is 29.05 kg/m.     12/21/2023    9:38 AM 08/16/2023    9:07 AM 07/18/2023    7:45 AM 07/16/2023    7:57 AM 07/10/2023    6:46 AM 07/04/2023    6:01 AM 06/28/2023    8:22 AM  Advanced Directives  Does Patient Have a Medical Advance Directive? Yes Yes Yes Yes Yes Yes Yes  Type of Estate agent of [place];Living will  Healthcare Power of [employer] Power of [employer] Power of [place];Living will  Healthcare Power of [place];Living will Healthcare Power of [place];Living will  Does patient want to make changes to medical advance directive?  No - Patient declined No - Patient declined No - Patient declined No - Patient declined  No - Patient declined  Copy of Healthcare Power of Attorney in Chart? No - copy requested  No - copy requested No - copy requested No - copy requested No - copy requested     Current Medications (verified) Outpatient Encounter Medications as of 12/21/2023  Medication Sig   aspirin EC 81 MG tablet Take 81 mg by mouth daily.   AUSTEDO XR 42 MG TB24 Take 1 tablet by mouth daily.   buPROPion (WELLBUTRIN XL) 150 MG 24 hr tablet Take 300 mg by mouth daily.   Cholecalciferol (VITAMIN D3) 2000 units TABS Take by mouth.   divalproex  (DEPAKOTE  ER) 250 MG 24 hr tablet Take by mouth.   ezetimibe  (ZETIA ) 10 MG tablet TAKE 1 TABLET BY MOUTH EVERY DAY   famotidine  (PEPCID ) 20 MG tablet Take 20 mg by mouth 2 (two) times daily. (Patient taking differently: Take 20 mg by mouth daily.)   FLUoxetine (PROZAC) 10 MG capsule Take 10 mg by mouth daily. Takes 2 one time daily   gabapentin  (NEURONTIN ) 300 MG capsule TAKE 1 CAPSULE BY MOUTH EVERYDAY AT BEDTIME   linaclotide  (LINZESS ) 145 MCG CAPS capsule Take 1 capsule (145 mcg total) by mouth daily as needed.   multivitamin-iron-minerals-folic acid  (CENTRUM) chewable tablet Chew 1 tablet by mouth daily.   MYRBETRIQ  50 MG TB24 tablet TAKE 1 TABLET BY  MOUTH EVERY DAY   Omega-3 Fatty Acids (FISH OIL) 1000 MG CAPS Take 2 capsules by mouth daily.   ondansetron  (ZOFRAN ) 8 MG tablet Take 1 tablet (8 mg total) by mouth every 8 (eight) hours as needed for nausea.   pravastatin  (PRAVACHOL ) 40 MG tablet TAKE 1 TABLET BY MOUTH EVERY DAY   trimethoprim  (TRIMPEX ) 100 MG tablet TAKE 1 TABLET BY MOUTH EVERY DAY   zolpidem  (AMBIEN ) 5 MG tablet Take 1 tablet (5 mg total) by mouth at bedtime as needed for sleep.   lidocaine -prilocaine  (EMLA ) cream Apply  topically as needed. (Patient not taking: Reported on 12/21/2023)   prochlorperazine  (COMPAZINE ) 10 MG tablet Take 1 tablet (10 mg total) by mouth every 6 (six) hours as needed for nausea or vomiting. (Patient not taking: Reported on 12/21/2023)   triamcinolone  cream (KENALOG ) 0.1 % APPLY TO AFFECTED AREA TWICE A DAY (Patient not taking: Reported on 12/21/2023)   No facility-administered encounter medications on file as of 12/21/2023.    Allergies (verified) Pecan nut (diagnostic) and Penicillins   History: Past Medical History:  Diagnosis Date   Allergy    Arthritis    left knee   Bipolar 1 disorder (HCC)    Follows w/ psychiatry, Dr. Elna Lo.   Cervical cancer (HCC) 2024   Follows w/ [facility], s/p chemo and radiation.   CKD (chronic kidney disease)    stage 3   Colon polyps    COPD (chronic obstructive pulmonary disease) (HCC)    hx of smoking   Depression    Heart murmur    See 11/11/20 echocardiogram in Epic.   History of alcohol abuse    History of chicken pox    History of drug abuse [facility])    Patient states that she quit marijuana in October of 2024.   History of radiation therapy    Pelvis-05/10/23-07/18/23- Dr. Lynwood Nasuti   Hyperlipidemia    Hypertension    hx of, no longer on meds per pt on 06/27/23   Mitral valve regurgitation    See 11/11/20 echo in Epic.   Pneumonia    history of   Pre-diabetes    controlled with diet per patient   Tardive dyskinesia    Patient is being treated with Ingrezza.   Past Surgical History:  Procedure Laterality Date   APPENDECTOMY     BREAST BIOPSY     CHOLECYSTECTOMY     IR IMAGING GUIDED PORT INSERTION  04/30/2023   LUNG BIOPSY     OPERATIVE ULTRASOUND N/A 06/28/2023   Procedure: OPERATIVE ULTRASOUND;  Surgeon: Nasuti Lynwood, MD;  Location: [facility];  Service: Urology;  Laterality: N/A;   OPERATIVE ULTRASOUND N/A 07/04/2023   Procedure: OPERATIVE ULTRASOUND;  Surgeon: Nasuti Lynwood, MD;   Location: [facility];  Service: Urology;  Laterality: N/A;   OPERATIVE ULTRASOUND N/A 07/10/2023   Procedure: OPERATIVE ULTRASOUND;  Surgeon: Nasuti Lynwood, MD;  Location: [facility];  Service: Urology;  Laterality: N/A;   OPERATIVE ULTRASOUND N/A 07/16/2023   Procedure: OPERATIVE ULTRASOUND;  Surgeon: Nasuti Lynwood, MD;  Location: [facility];  Service: Urology;  Laterality: N/A;   OPERATIVE ULTRASOUND N/A 07/18/2023   Procedure: OPERATIVE ULTRASOUND;  Surgeon: Nasuti Lynwood, MD;  Location: [facility];  Service: Urology;  Laterality: N/A;   SUBTOTAL COLECTOMY  2005   [place], [place]   TANDEM RING INSERTION N/A 06/28/2023   Procedure: TANDEM RING INSERTION;  Surgeon: Nasuti Lynwood, MD;  Location: [facility];  Service: Urology;  Laterality: N/A;   TANDEM RING INSERTION N/A 07/04/2023   Procedure: TANDEM RING INSERTION;  Surgeon: [name] Agent, MD;  Location: [facility];  Service: Urology;  Laterality: N/A;   TANDEM RING INSERTION N/A 07/10/2023   Procedure: TANDEM RING INSERTION;  Surgeon: [name] Agent, MD;  Location: [facility];  Service: Urology;  Laterality: N/A;   TANDEM RING INSERTION N/A 07/16/2023   Procedure: TANDEM RING INSERTION;  Surgeon: [name] Agent, MD;  Location: [facility];  Service: Urology;  Laterality: N/A;   TANDEM RING INSERTION N/A 07/18/2023   Procedure: TANDEM RING INSERTION;  Surgeon: [name] Agent, MD;  Location: [facility];  Service: Urology;  Laterality: N/A;   Family History  Problem Relation Age of Onset   Hyperlipidemia Mother    COPD Mother        smoked   Arthritis Father    Heart disease Father    Diabetes Father    Prostate cancer Brother    Heart disease Paternal Grandmother    Heart disease Paternal Uncle    Breast cancer Neg Hx    Ovarian cancer Neg Hx    Colon cancer Neg Hx    Endometrial cancer Neg Hx    Social  History   Socioeconomic History   Marital status: Single    Spouse name: Not on file   Number of children: 1   Years of education: 14   Highest education level: Not on file  Occupational History   Occupation: Retired Charity fundraiser  Tobacco Use   Smoking status: Former    Current packs/day: 0.00    Average packs/day: 1 pack/day for 55.0 years (55.0 ttl pk-yrs)    Types: Cigarettes    Start date: 08/03/1961    Quit date: 08/03/2016    Years since quitting: 7.3   Smokeless tobacco: Never  Vaping Use   Vaping status: Never Used  Substance and Sexual Activity   Alcohol use: Not Currently    Alcohol/week: 4.0 - 6.0 standard drinks of alcohol    Types: 4 - 6 Cans of beer per week    Comment: Stopped drinking alcohol in October 2024 per patient   Drug use: Not Currently    Frequency: 7.0 times per week    Types: Marijuana    Comment: Stopped in October 2024   Sexual activity: Not Currently    Birth control/protection: Post-menopausal  Other Topics Concern   Not on file  Social History Narrative   Fun/Hobby: Read   Denies abuse and feels safe at home       Lives alone/2025   Social Drivers of Health   Financial Resource Strain: Low Risk  (12/21/2023)   Overall Financial Resource Strain (CARDIA)    Difficulty of Paying Living Expenses: Not hard at all  Food Insecurity: No Food Insecurity (12/21/2023)   Hunger Vital Sign    Worried About Running Out of Food in the Last Year: Never true    Ran Out of Food in the Last Year: Never true  Transportation Needs: No Transportation Needs (12/21/2023)   PRAPARE - Administrator, Civil Service (Medical): No    Lack of Transportation (Non-Medical): No  Physical Activity: Insufficiently Active (12/21/2023)   Exercise Vital Sign    Days of Exercise per Week: 1 day    Minutes of Exercise per Session: 50 min  Stress: No Stress Concern Present (12/21/2023)   [employer]  of Occupational Health - Occupational Stress Questionnaire    Feeling  of Stress: Not at all  Social Connections: Socially Isolated (12/21/2023)   Social Connection and Isolation Panel    Frequency of Communication with Friends and Family: More than three times a week    Frequency of Social Gatherings with Friends and Family: Once a week    Attends Religious Services: Never    Database administrator or Organizations: No    Attends Engineer, structural: Never    Marital Status: Divorced    Tobacco Counseling Counseling given: Not Answered    Clinical Intake:  Pre-visit preparation completed: Yes  Pain : No/denies pain     BMI - recorded: 29.05 Nutritional Risks: None Diabetes: No  Lab Results  Component Value Date   HGBA1C 5.8 12/18/2022   HGBA1C 6.1 06/19/2022   HGBA1C 5.9 03/09/2022     How often do you need to have someone help you when you read instructions, pamphlets, or other written materials from your doctor or pharmacy?: 1 - Never  Interpreter Needed?: No  Information entered by :: Kimiah Hibner, RMA   Activities of Daily Living     12/21/2023    9:30 AM 07/18/2023    7:51 AM  In your present state of health, do you have any difficulty performing the following activities:  Hearing? 1 0  Comment Per pt- has some difficulty hearing   Vision? 0 0  Difficulty concentrating or making decisions? 0 1  Walking or climbing stairs? 0   Dressing or bathing? 0   Doing errands, shopping? 0   Comment Son drives her around   Quarry manager and eating ? N   Using the Toilet? N   In the past six months, have you accidently leaked urine? N   Do you have problems with loss of bowel control? N   Managing your Medications? N   Managing your Finances? N   Housekeeping or managing your Housekeeping? N     Patient Care Team: [name] ORN, MD as PCP - General (Internal Medicine) Patrcia Sharper, MD as Consulting Physician (Ophthalmology) Dallie Vera GAILS, MD as Consulting Physician (Gynecology)  I have updated your Care Teams  any recent Medical Services you may have received from other providers in the past year.     Assessment:   This is a routine wellness examination for [place].  Hearing/Vision screen Hearing Screening - Comments:: Per pt- has some difficulty hearing Vision Screening - Comments:: Denies vision issues./Dr. Patrcia    Goals Addressed   None    Depression Screen     12/21/2023    9:42 AM 11/26/2023    8:58 AM 11/06/2023    8:31 AM 08/30/2023    1:02 PM 04/26/2023    2:51 PM 03/22/2023    9:52 AM 03/22/2023    9:43 AM  PHQ 2/9 Scores  PHQ - 2 Score 0 0 0 0 2 2 2   PHQ- 9 Score 0     8 8    Fall Risk     12/21/2023    9:39 AM 11/26/2023    9:04 AM 11/06/2023    8:31 AM 08/30/2023    1:14 PM 02/26/2023    3:51 PM  Fall Risk   Falls in the past year? 1 1 1 1  0  Number falls in past yr: 1 1 1 1  0  Injury with Fall? 0 0 0 0 0  Risk for fall due to : Impaired  balance/gait History of fall(s) No Fall Risks History of fall(s) No Fall Risks  Follow up Falls evaluation completed;Falls prevention discussed Falls evaluation completed Falls evaluation completed Falls evaluation completed Falls evaluation completed    MEDICARE RISK AT HOME:  Medicare Risk at Home Any stairs in or around the home?: No If so, are there any without handrails?: No Home free of loose throw rugs in walkways, pet beds, electrical cords, etc?: Yes Adequate lighting in your home to reduce risk of falls?: Yes Life alert?: No Use of a cane, walker or w/c?: Yes (uses a walker) Grab bars in the bathroom?: Yes Shower chair or bench in shower?: Yes Elevated toilet seat or a handicapped toilet?: Yes  TIMED UP AND GO:  Was the test performed?  No  Cognitive Function: Declined/Normal: No cognitive concerns noted by patient or family. Patient alert, oriented, able to answer questions appropriately and recall recent events. No signs of memory loss or confusion.    02/12/2017   11:38 AM  MMSE - Mini Mental State Exam   Orientation to time 5   Orientation to Place 5   Registration 3   Attention/ Calculation 5   Recall 2   Language- name 2 objects 2   Language- repeat 1  Language- follow 3 step command 3   Language- read & follow direction 1   Write a sentence 1   Copy design 1   Total score 29      Data saved with a previous flowsheet row definition      04/10/2022    1:53 PM  Montreal Cognitive Assessment   Visuospatial/ Executive (0/5) 3  Naming (0/3) 3  Attention: Read list of digits (0/2) 2  Attention: Read list of letters (0/1) 1  Attention: Serial 7 subtraction starting at 100 (0/3) 2  Language: Repeat phrase (0/2) 2  Language : Fluency (0/1) 1  Abstraction (0/2) 2  Delayed Recall (0/5) 1  Orientation (0/6) 6  Total 23      03/22/2022    1:19 PM 12/11/2019    2:35 PM  6CIT Screen  What Year? 0 points 0 points  What month? 0 points 0 points  What time? 0 points 3 points  Count back from 20 0 points 0 points  Months in reverse 0 points 0 points  Repeat phrase 0 points 0 points  Total Score 0 points 3 points    Immunizations Immunization History  Administered Date(s) Administered   Fluad Quad(high Dose 65+) 03/09/2022   Fluad Trivalent(High Dose 65+) 05/16/2023   Influenza, High Dose Seasonal PF 01/30/2017, 02/06/2017, 03/13/2018, 02/18/2019   Influenza-Unspecified 02/03/2017, 03/04/2020, 03/02/2021   PFIZER(Purple Top)SARS-COV-2 Vaccination 08/11/2019, 09/10/2019, 03/20/2020, 09/21/2020, 03/02/2021   Pneumococcal Conjugate-13 02/12/2017   Pneumococcal Polysaccharide-23 12/02/2018   Tdap 05/25/2017    Screening Tests Health Maintenance  Topic Date Due   Lung Cancer Screening  11/25/2024 (Originally 05/05/1995)   INFLUENZA VACCINE  01/04/2024   Medicare Annual Wellness (AWV)  12/20/2024   DTaP/Tdap/Td (2 - Td or Tdap) 05/26/2027   Pneumococcal Vaccine: 50+ Years  Completed   DEXA SCAN  Completed   Hepatitis C Screening  Completed   Hepatitis B Vaccines  Aged Out    HPV VACCINES  Aged Out   Meningococcal B Vaccine  Aged Out   COVID-19 Vaccine  Discontinued   Zoster Vaccines- Shingrix  Discontinued    Health Maintenance  There are no preventive care reminders to display for this patient. Health Maintenance Items Addressed: See Nurse  Notes at the end of this note  Additional Screening:  Vision Screening: Recommended annual ophthalmology exams for early detection of glaucoma and other disorders of the eye. Would you like a referral to an eye doctor? No    Dental Screening: Recommended annual dental exams for proper oral hygiene  Community Resource Referral / Chronic Care Management: CRR required this visit?  No   CCM required this visit?  No   Plan:    I have personally reviewed and noted the following in the patient's chart:   Medical and social history Use of alcohol, tobacco or illicit drugs  Current medications and supplements including opioid prescriptions. Patient is not currently taking opioid prescriptions. Functional ability and status Nutritional status Physical activity Advanced directives List of other physicians Hospitalizations, surgeries, and ER visits in previous 12 months Vitals Screenings to include cognitive, depression, and falls Referrals and appointments  In addition, I have reviewed and discussed with patient certain preventive protocols, quality metrics, and best practice recommendations. A written personalized care plan for preventive services as well as general preventive health recommendations were provided to patient.   Zackry Deines L Kawena Lyday, CMA   12/21/2023   After Visit Summary: (MyChart) Due to this being a telephonic visit, the after visit summary with patients personalized plan was offered to patient via MyChart   Notes: Nothing significant to report at this time.

## 2023-12-26 ENCOUNTER — Other Ambulatory Visit: Payer: Self-pay | Admitting: Internal Medicine

## 2023-12-28 DIAGNOSIS — H04123 Dry eye syndrome of bilateral lacrimal glands: Secondary | ICD-10-CM | POA: Diagnosis not present

## 2023-12-28 DIAGNOSIS — H5211 Myopia, right eye: Secondary | ICD-10-CM | POA: Diagnosis not present

## 2023-12-28 DIAGNOSIS — Z961 Presence of intraocular lens: Secondary | ICD-10-CM | POA: Diagnosis not present

## 2023-12-28 DIAGNOSIS — H43813 Vitreous degeneration, bilateral: Secondary | ICD-10-CM | POA: Diagnosis not present

## 2023-12-28 DIAGNOSIS — H524 Presbyopia: Secondary | ICD-10-CM | POA: Diagnosis not present

## 2024-01-20 NOTE — Progress Notes (Signed)
 Radiation Oncology         [phone] ________________________________  Name: Paula [name] MRN: [id number]  Date: 01/21/2024  DOB: 10-25-1945  Follow-Up Visit Note  CC: [name] ORN, MD  [name] ORN, MD  No diagnosis found.  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   Interval Since Last Radiation: 6 months and 6 days   Radiation Treatment Dates: First Treatment Date: 2023-05-10 -- Last Treatment Date: 2023-07-18 Site/Dose/Technique/Mode:  Plan Name: Pelvis Site: Pelvis Technique: IMRT Mode: Photon Dose Per Fraction: 1.8 Gy Prescribed Dose (Delivered / Prescribed): 34.2 Gy / 34.2 Gy Prescribed Fxs (Delivered / Prescribed): 19 / 19   Plan Name: Pelvis:1 Site: Pelvis Technique: IMRT Mode: Photon Dose Per Fraction: 1.8 Gy Prescribed Dose (Delivered / Prescribed): 10.8 Gy / 10.8 Gy Prescribed Fxs (Delivered / Prescribed): 6 / 6   Plan Name: Pelvis_Bst Site: Pelvis Technique: 3D Mode: Photon Dose Per Fraction: 1.8 Gy Prescribed Dose (Delivered / Prescribed): 9 Gy / 9 Gy Prescribed Fxs (Delivered / Prescribed): 5 / 5   Plan Name: Cervix_HDR_F1-5 Site: Cervix Technique: HDR Ir-192 Mode: Brachytherapy Dose Per Fraction: 5.5 Gy Prescribed Dose (Delivered / Prescribed): 27.5 Gy / 27.5 Gy Prescribed Fxs (Delivered / Prescribed): 5 / 5  Narrative:  The patient returns today for routine follow-up. She was last seen here for follow-up on 08/16/23.   Since her last visit she presented for a restaging PET scan on 10/22/23 which showed no evidence of recurrent or metastatic disease. Other findings of potential clinical significance include bilateral adrenal adenomas and aortic atherosclerosis.    She also followed up with Dr. Eldonna on 10/22/23. She was noted to be doing well and NED on examination at that time.   She was also recently seen by Dr. Lonn on 12/06/23 for a routine follow-up visit. She will continue to  meet with Dr. Lonn for port maintenance every other month until her next imaging study in November.  Dr. Lonn has recommended port removal if her next imaging study shows no evidence of disease.    ***                      Allergies:  is allergic to pecan nut (diagnostic) and penicillins.  Meds: Current Outpatient Medications  Medication Sig Dispense Refill   aspirin EC 81 MG tablet Take 81 mg by mouth daily.     AUSTEDO XR 42 MG TB24 Take 1 tablet by mouth daily.     buPROPion (WELLBUTRIN XL) 150 MG 24 hr tablet Take 300 mg by mouth daily.     Cholecalciferol (VITAMIN D3) 2000 units TABS Take by mouth.     divalproex  (DEPAKOTE  ER) 250 MG 24 hr tablet Take by mouth.     ezetimibe  (ZETIA ) 10 MG tablet TAKE 1 TABLET BY MOUTH EVERY DAY 90 tablet 3   famotidine  (PEPCID ) 20 MG tablet Take 20 mg by mouth 2 (two) times daily. (Patient taking differently: Take 20 mg by mouth daily.)     FLUoxetine (PROZAC) 10 MG capsule Take 10 mg by mouth daily. Takes 2 one time daily  2   gabapentin  (NEURONTIN ) 300 MG capsule TAKE 1 CAPSULE BY MOUTH EVERYDAY AT BEDTIME 30 capsule 3   lidocaine -prilocaine  (EMLA ) cream Apply topically as needed. (Patient not taking: Reported on 12/21/2023) 30 g 3   LINZESS  145 MCG CAPS capsule TAKE 1 CAPSULE (145 MCG TOTAL) BY MOUTH DAILY AS NEEDED. 30 capsule 11  multivitamin-iron-minerals-folic acid  (CENTRUM) chewable tablet Chew 1 tablet by mouth daily.     MYRBETRIQ  50 MG TB24 tablet TAKE 1 TABLET BY MOUTH EVERY DAY 90 tablet 3   Omega-3 Fatty Acids (FISH OIL) 1000 MG CAPS Take 2 capsules by mouth daily.     ondansetron  (ZOFRAN ) 8 MG tablet Take 1 tablet (8 mg total) by mouth every 8 (eight) hours as needed for nausea. 30 tablet 3   pravastatin  (PRAVACHOL ) 40 MG tablet TAKE 1 TABLET BY MOUTH EVERY DAY 90 tablet 3   prochlorperazine  (COMPAZINE ) 10 MG tablet Take 1 tablet (10 mg total) by mouth every 6 (six) hours as needed for nausea or vomiting. (Patient not taking:  Reported on 12/21/2023) 30 tablet 3   triamcinolone  cream (KENALOG ) 0.1 % APPLY TO AFFECTED AREA TWICE A DAY (Patient not taking: Reported on 12/21/2023) 30 g 1   trimethoprim  (TRIMPEX ) 100 MG tablet TAKE 1 TABLET BY MOUTH EVERY DAY 90 tablet 3   zolpidem  (AMBIEN ) 5 MG tablet Take 1 tablet (5 mg total) by mouth at bedtime as needed for sleep. 30 tablet 5   No current facility-administered medications for this encounter.    Physical Findings: The patient is in no acute distress. Patient is alert and oriented.  vitals were not taken for this visit. .  No significant changes. Lungs are clear to auscultation bilaterally. Heart has regular rate and rhythm. No palpable cervical, supraclavicular, or axillary adenopathy. Abdomen soft, non-tender, normal bowel sounds.  On pelvic examination the external genitalia were unremarkable. A speculum exam was performed. There are no mucosal lesions noted in the vaginal vault. A Pap smear was obtained of the proximal vagina. On bimanual and rectovaginal examination there were no pelvic masses appreciated. ***   Lab Findings: Lab Results  Component Value Date   WBC 4.6 12/06/2023   HGB 12.1 12/06/2023   HCT 36.3 12/06/2023   MCV 99.7 12/06/2023   PLT 134 (L) 12/06/2023    Radiographic Findings: No results found.  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  ***  Plan:  ***   *** minutes of total time was spent for this patient encounter, including preparation, face-to-face counseling with the patient and coordination of care, physical exam, and documentation of the encounter. ____________________________________  [name], PhD, MD  This document serves as a record of services personally performed by Lynwood Nasuti, MD. It was created on his behalf by Dorthy Fuse, a trained medical scribe. The creation of this record is based on the  scribe's personal observations and the provider's statements to them. This document has been checked and approved by the attending provider.

## 2024-01-21 ENCOUNTER — Inpatient Hospital Stay: Attending: Psychiatry

## 2024-01-21 ENCOUNTER — Ambulatory Visit
Admission: RE | Admit: 2024-01-21 | Discharge: 2024-01-21 | Disposition: A | Discharge status: Home / Self Care | Payer: Self-pay | Source: Ambulatory Visit | Attending: Radiation Oncology | Admitting: Radiation Oncology

## 2024-01-21 ENCOUNTER — Encounter: Payer: Self-pay | Admitting: Radiation Oncology

## 2024-01-21 VITALS — BP 123/66 | HR 74 | Temp 97.9°F | Resp 20 | Ht 63.0 in | Wt 159.2 lb

## 2024-01-21 DIAGNOSIS — I700 Atherosclerosis of aorta: Secondary | ICD-10-CM | POA: Insufficient documentation

## 2024-01-21 DIAGNOSIS — C538 Malignant neoplasm of overlapping sites of cervix uteri: Secondary | ICD-10-CM | POA: Diagnosis present

## 2024-01-21 DIAGNOSIS — Z8541 Personal history of malignant neoplasm of cervix uteri: Secondary | ICD-10-CM | POA: Insufficient documentation

## 2024-01-21 DIAGNOSIS — Z923 Personal history of irradiation: Secondary | ICD-10-CM | POA: Insufficient documentation

## 2024-01-21 DIAGNOSIS — D3502 Benign neoplasm of left adrenal gland: Secondary | ICD-10-CM | POA: Insufficient documentation

## 2024-01-21 DIAGNOSIS — Z7982 Long term (current) use of aspirin: Secondary | ICD-10-CM | POA: Insufficient documentation

## 2024-01-21 DIAGNOSIS — D3501 Benign neoplasm of right adrenal gland: Secondary | ICD-10-CM | POA: Insufficient documentation

## 2024-01-21 DIAGNOSIS — Z79899 Other long term (current) drug therapy: Secondary | ICD-10-CM | POA: Insufficient documentation

## 2024-01-21 MED ORDER — SODIUM CHLORIDE 0.9% FLUSH
10.0000 mL | Freq: Once | INTRAVENOUS | Status: AC
  Administered 2024-01-21: 10 mL

## 2024-01-21 NOTE — Progress Notes (Signed)
 Paula [name] is here today for follow up post radiation to the pelvic.  They completed their radiation on: 07/18/23  Does the patient complain of any of the following:  Pain:Denies Abdominal bloating: Denies Diarrhea/Constipation: Denies Nausea/Vomiting: Denies Vaginal Discharge: Denies Blood in Urine or Stool: Denies Urinary Issues (dysuria/incomplete emptying/ incontinence/ increased frequency/urgency): Denies Does patient report using vaginal dilator 2-3 times a week and/or sexually active 2-3 weeks: Yes but reports recently misplacing the bigger size. Post radiation skin changes: Denies   Additional comments if applicable: None  BP 123/66 (BP Location: Left Arm, Patient Position: Sitting, Cuff Size: Large)   Pulse 74   Temp 97.9 F (36.6 C)   Resp 20   Ht 5' 3 (1.6 m)   Wt 159 lb 3.2 oz (72.2 kg)   SpO2 97%   BMI 28.20 kg/m

## 2024-01-30 ENCOUNTER — Other Ambulatory Visit: Payer: Self-pay | Admitting: Internal Medicine

## 2024-02-07 ENCOUNTER — Encounter: Payer: Self-pay | Admitting: Internal Medicine

## 2024-02-07 ENCOUNTER — Ambulatory Visit: Admitting: Internal Medicine

## 2024-02-07 ENCOUNTER — Ambulatory Visit: Payer: Self-pay | Admitting: Internal Medicine

## 2024-02-07 VITALS — BP 108/58 | HR 71 | Temp 98.0°F | Ht 63.0 in | Wt 166.2 lb

## 2024-02-07 DIAGNOSIS — R42 Dizziness and giddiness: Secondary | ICD-10-CM | POA: Diagnosis not present

## 2024-02-07 DIAGNOSIS — E782 Mixed hyperlipidemia: Secondary | ICD-10-CM

## 2024-02-07 DIAGNOSIS — C538 Malignant neoplasm of overlapping sites of cervix uteri: Secondary | ICD-10-CM

## 2024-02-07 DIAGNOSIS — R7303 Prediabetes: Secondary | ICD-10-CM

## 2024-02-07 DIAGNOSIS — R296 Repeated falls: Secondary | ICD-10-CM | POA: Diagnosis not present

## 2024-02-07 DIAGNOSIS — J449 Chronic obstructive pulmonary disease, unspecified: Secondary | ICD-10-CM | POA: Diagnosis not present

## 2024-02-07 DIAGNOSIS — N1832 Chronic kidney disease, stage 3b: Secondary | ICD-10-CM | POA: Diagnosis not present

## 2024-02-07 LAB — LIPID PANEL
Cholesterol: 147 mg/dL (ref 0–200)
HDL: 69.2 mg/dL (ref >39.00)
LDL Cholesterol: 30 mg/dL (ref 0–99)
NonHDL: 77.73
Total CHOL/HDL Ratio: 2
Triglycerides: 241 mg/dL — ABNORMAL HIGH (ref 0.0–149.0)
VLDL: 48.2 mg/dL — ABNORMAL HIGH (ref 0.0–40.0)

## 2024-02-07 LAB — HEMOGLOBIN A1C: Hgb A1c MFr Bld: 5.4 % (ref 4.6–6.5)

## 2024-02-07 LAB — HEPATIC FUNCTION PANEL
ALT: 18 U/L (ref 0–35)
AST: 16 U/L (ref 0–37)
Albumin: 3.8 g/dL (ref 3.5–5.2)
Alkaline Phosphatase: 77 U/L (ref 39–117)
Bilirubin, Direct: 0.1 mg/dL (ref 0.0–0.3)
Total Bilirubin: 0.3 mg/dL (ref 0.2–1.2)
Total Protein: 6.5 g/dL (ref 6.0–8.3)

## 2024-02-07 LAB — BASIC METABOLIC PANEL WITH GFR
BUN: 31 mg/dL — ABNORMAL HIGH (ref 6–23)
CO2: 28 meq/L (ref 19–32)
Calcium: 9 mg/dL (ref 8.4–10.5)
Chloride: 107 meq/L (ref 96–112)
Creatinine, Ser: 1.79 mg/dL — ABNORMAL HIGH (ref 0.40–1.20)
GFR: 26.78 mL/min — ABNORMAL LOW (ref >60.00)
Glucose, Bld: 111 mg/dL — ABNORMAL HIGH (ref 70–99)
Potassium: 4.5 meq/L (ref 3.5–5.1)
Sodium: 141 meq/L (ref 135–145)

## 2024-02-07 NOTE — Assessment & Plan Note (Addendum)
 Lab Results  Component Value Date   CREATININE 1.79 (H) 02/07/2024   Now worsening to 3b, cont to avoid nephrotoxins, refer nephrology

## 2024-02-07 NOTE — Progress Notes (Signed)
 Patient ID: DASIAH [name], female   DOB: 08-23-1945, 78 y.o.   MRN: [id number]        Chief Complaint: follow up dizziness, recent fall, ckd3b, hld., preDM, copd       HPI:  Paula [name] is a 78 y.o. female here after recent fall without injury, but has ongoing maybe worsening dizziness with lightheadedness for last few wks.  Pt denies chest pain, increased sob or doe, wheezing, orthopnea, PND, increased LE swelling, palpitations, dizziness or syncope.   Pt denies polydipsia, polyuria, or new focal neuro s/s.    Pt denies fever, wt loss, night sweats, loss of appetite, or other constitutional symptoms  Depakote  stopped per psychiatry and overall improved dizzines but still persists.  Asking for neurology referral.  Has walker with her today Wt Readings from Last 3 Encounters:  02/07/24 166 lb 3.2 oz (75.4 kg)  01/21/24 159 lb 3.2 oz (72.2 kg)  12/21/23 164 lb (74.4 kg)   BP Readings from Last 3 Encounters:  02/07/24 (!) 108/58  01/21/24 123/66  12/06/23 (!) 151/67         Past Medical History:  Diagnosis Date   Allergy    Arthritis    left knee   Bipolar 1 disorder (HCC)    Follows w/ psychiatry, Dr. Elna Lo.   Cervical cancer (HCC) 2024   Follows w/ [facility], s/p chemo and radiation.   CKD (chronic kidney disease)    stage 3   Colon polyps    COPD (chronic obstructive pulmonary disease) (HCC)    hx of smoking   Depression    Heart murmur    See 11/11/20 echocardiogram in Epic.   History of alcohol abuse    History of chicken pox    History of drug abuse [facility])    Patient states that she quit marijuana in October of 2024.   History of radiation therapy    Pelvis-05/10/23-07/18/23- Dr. Lynwood Nasuti   Hyperlipidemia    Hypertension    hx of, no longer on meds per pt on 06/27/23   Mitral valve regurgitation    See 11/11/20 echo in Epic.   Pneumonia    history of   Pre-diabetes    controlled with diet per patient   Tardive dyskinesia    Patient is  being treated with Ingrezza.   Past Surgical History:  Procedure Laterality Date   APPENDECTOMY     BREAST BIOPSY     CHOLECYSTECTOMY     IR IMAGING GUIDED PORT INSERTION  04/30/2023   LUNG BIOPSY     OPERATIVE ULTRASOUND N/A 06/28/2023   Procedure: OPERATIVE ULTRASOUND;  Surgeon: Nasuti Lynwood, MD;  Location: [facility];  Service: Urology;  Laterality: N/A;   OPERATIVE ULTRASOUND N/A 07/04/2023   Procedure: OPERATIVE ULTRASOUND;  Surgeon: Nasuti Lynwood, MD;  Location[facility];  Service: Urology;  Laterality: N/A;   OPERATIVE ULTRASOUND N/A 07/10/2023   Procedure: OPERATIVE ULTRASOUND;  Surgeon: Nasuti Lynwood, MD;  Location: [facility];  Service: Urology;  Laterality: N/A;   OPERATIVE ULTRASOUND N/A 07/16/2023   Procedure: OPERATIVE ULTRASOUND;  Surgeon: Nasuti Lynwood, MD;  Location: [facility];  Service: Urology;  Laterality: N/A;   OPERATIVE ULTRASOUND N/A 07/18/2023   Procedure: OPERATIVE ULTRASOUND;  Surgeon: Nasuti Lynwood, MD;  Location: [facility];  Service: Urology;  Laterality: N/A;   SUBTOTAL COLECTOMY  2005   [facility], [place]   TANDEM RING INSERTION N/A 06/28/2023  Procedure: TANDEM RING INSERTION;  Surgeon: [name] Agent, MD;  Location: [facility];  Service: Urology;  Laterality: N/A;   TANDEM RING INSERTION N/A 07/04/2023   Procedure: TANDEM RING INSERTION;  Surgeon: [name] Agent, MD;  Location: [facility];  Service: Urology;  Laterality: N/A;   TANDEM RING INSERTION N/A 07/10/2023   Procedure: TANDEM RING INSERTION;  Surgeon: [name] Agent, MD;  Location: [facility];  Service: Urology;  Laterality: N/A;   TANDEM RING INSERTION N/A 07/16/2023   Procedure: TANDEM RING INSERTION;  Surgeon: [name] Agent, MD;  Location: [facility];  Service: Urology;  Laterality: N/A;   TANDEM RING INSERTION N/A 07/18/2023   Procedure: TANDEM RING INSERTION;   Surgeon: [name] Agent, MD;  Location: [facility];  Service: Urology;  Laterality: N/A;    reports that she quit smoking about 7 years ago. Her smoking use included cigarettes. She started smoking about 62 years ago. She has a 55 pack-year smoking history. She has never used smokeless tobacco. She reports that she does not currently use alcohol after a past usage of about 4.0 - 6.0 standard drinks of alcohol per week. She reports that she does not currently use drugs after having used the following drugs: Marijuana. Frequency: 7.00 times per week. family history includes Arthritis in her father; COPD in her mother; Diabetes in her father; Heart disease in her father, paternal grandmother, and paternal uncle; Hyperlipidemia in her mother; Prostate cancer in her brother. Allergies  Allergen Reactions   Pecan Nut (Diagnostic) Anaphylaxis   Penicillins Other (See Comments)    Has taken and not had reaction, allergist did several tests and told her not to take   Current Outpatient Medications on File Prior to Visit  Medication Sig Dispense Refill   aspirin EC 81 MG tablet Take 81 mg by mouth daily.     AUSTEDO XR 42 MG TB24 Take 1 tablet by mouth daily.     buPROPion (WELLBUTRIN XL) 150 MG 24 hr tablet Take 300 mg by mouth daily.     Cholecalciferol (VITAMIN D3) 2000 units TABS Take by mouth.     ezetimibe  (ZETIA ) 10 MG tablet TAKE 1 TABLET BY MOUTH EVERY DAY 90 tablet 3   famotidine  (PEPCID ) 20 MG tablet Take 20 mg by mouth 2 (two) times daily.     FLUoxetine (PROZAC) 10 MG capsule Take 10 mg by mouth daily. Takes 2 one time daily  2   gabapentin  (NEURONTIN ) 300 MG capsule TAKE 1 CAPSULE BY MOUTH EVERYDAY AT BEDTIME 30 capsule 3   lidocaine -prilocaine  (EMLA ) cream Apply topically as needed. 30 g 3   LINZESS  145 MCG CAPS capsule TAKE 1 CAPSULE (145 MCG TOTAL) BY MOUTH DAILY AS NEEDED. 30 capsule 11   multivitamin-iron-minerals-folic acid  (CENTRUM) chewable tablet Chew 1 tablet by  mouth daily.     MYRBETRIQ  50 MG TB24 tablet TAKE 1 TABLET BY MOUTH EVERY DAY 90 tablet 3   Omega-3 Fatty Acids (FISH OIL) 1000 MG CAPS Take 2 capsules by mouth daily.     ondansetron  (ZOFRAN ) 8 MG tablet Take 1 tablet (8 mg total) by mouth every 8 (eight) hours as needed for nausea. 30 tablet 3   pravastatin  (PRAVACHOL ) 40 MG tablet TAKE 1 TABLET BY MOUTH EVERY DAY 90 tablet 3   triamcinolone  cream (KENALOG ) 0.1 % APPLY TO AFFECTED AREA TWICE A DAY 30 g 1   trimethoprim  (TRIMPEX ) 100 MG tablet TAKE 1 TABLET BY MOUTH EVERY DAY  90 tablet 3   zolpidem  (AMBIEN ) 5 MG tablet Take 1 tablet (5 mg total) by mouth at bedtime as needed for sleep. 30 tablet 5   prochlorperazine  (COMPAZINE ) 10 MG tablet Take 1 tablet (10 mg total) by mouth every 6 (six) hours as needed for nausea or vomiting. (Patient not taking: Reported on 02/07/2024) 30 tablet 3   No current facility-administered medications on file prior to visit.        ROS:  All others reviewed and negative.  Objective        PE:  BP (!) 108/58   Pulse 71   Temp 98 F (36.7 C)   Ht 5' 3 (1.6 m)   Wt 166 lb 3.2 oz (75.4 kg)   SpO2 97%   BMI 29.44 kg/m                 Constitutional: Pt appears in NAD               HENT: Head: NCAT.                Right Ear: External ear normal.                 Left Ear: External ear normal.                Eyes: . Pupils are equal, round, and reactive to light. Conjunctivae and EOM are normal               Nose: without d/c or deformity               Neck: Neck supple. Gross normal ROM               Cardiovascular: Normal rate and regular rhythm.                 Pulmonary/Chest: Effort normal and breath sounds without rales or wheezing.                Abd:  Soft, NT, ND, + BS, no organomegaly               Neurological: Pt is alert. At baseline orientation, motor grossly intact               Skin: Skin is warm. No rashes, no other new lesions, LE edema - none               Psychiatric: Pt behavior is  normal without agitation   Micro: none  Cardiac tracings I have personally interpreted today:  none  Pertinent Radiological findings (summarize): none   Lab Results  Component Value Date   WBC 4.6 12/06/2023   HGB 12.1 12/06/2023   HCT 36.3 12/06/2023   PLT 134 (L) 12/06/2023   GLUCOSE 111 (H) 02/07/2024   CHOL 147 02/07/2024   TRIG 241.0 (H) 02/07/2024   HDL 69.20 02/07/2024   LDLDIRECT 73.0 06/16/2021   LDLCALC 30 02/07/2024   ALT 18 02/07/2024   AST 16 02/07/2024   NA 141 02/07/2024   K 4.5 02/07/2024   CL 107 02/07/2024   CREATININE 1.79 (H) 02/07/2024   BUN 31 (H) 02/07/2024   CO2 28 02/07/2024   TSH 0.63 01/15/2023   HGBA1C 5.4 02/07/2024   Assessment/Plan:  Paula [name] is a 78 y.o. White or Caucasian [1] female with  has a past medical history of Allergy, Arthritis, Bipolar 1 disorder (HCC), Cervical cancer (HCC) (2024), CKD (chronic kidney disease), Colon  polyps, COPD (chronic obstructive pulmonary disease) (HCC), Depression, Heart murmur, History of alcohol abuse, History of chicken pox, History of drug abuse (HCC), History of radiation therapy, Hyperlipidemia, Hypertension, Mitral valve regurgitation, Pneumonia, Pre-diabetes, and Tardive dyskinesia.  COPD  GOLD 0  Stable, cont current med tx inhaler asd  CKD (chronic kidney disease) stage 3, GFR 30-59 ml/min (HCC) Lab Results  Component Value Date   CREATININE 1.79 (H) 02/07/2024   Now worsening to 3b, cont to avoid nephrotoxins, refer nephrology   Dizziness Etiology unclear , recent labs reviewed, pt for neurology referral as requested, also continue PT as she already has   Pre-diabetes Lab Results  Component Value Date   HGBA1C 5.4 02/07/2024   Stable, pt to continue current medical treatment  - diet, wt control   Mixed hyperlipidemia Lab Results  Component Value Date   LDLCALC 30 02/07/2024   Stable, pt to continue current statin zetia  10 mg every day, pravachol  40 mg qd   Malignant  neoplasm of cervix (HCC) Pt has repeat PET scan sched for Nov as planneed  Followup: Return in about 6 months (around 08/06/2024).  Lynwood Rush, MD 02/07/2024 8:48 PM [facility]

## 2024-02-07 NOTE — Assessment & Plan Note (Signed)
 Pt has repeat PET scan sched for Nov as planneed

## 2024-02-07 NOTE — Patient Instructions (Signed)
 Please continue all other medications as before, and refills have been done if requested.  Please have the pharmacy call with any other refills you may need.  Please continue your efforts at being more active, low cholesterol diet, and weight control.  Please keep your appointments with your specialists as you may have planned  You will be contacted regarding the referral for: Neurology, and Nephrology for the kidneys  Please go to the LAB at the blood drawing area for the tests to be done  You will be contacted by phone if any changes need to be made immediately.  Otherwise, you will receive a letter about your results with an explanation, but please check with MyChart first.  Please make an Appointment to return in 6 months, or sooner if needed

## 2024-02-07 NOTE — Assessment & Plan Note (Signed)
 Lab Results  Component Value Date   HGBA1C 5.4 02/07/2024   Stable, pt to continue current medical treatment  - diet, wt control

## 2024-02-07 NOTE — Assessment & Plan Note (Signed)
 Lab Results  Component Value Date   LDLCALC 30 02/07/2024   Stable, pt to continue current statin zetia  10 mg every day, pravachol  40 mg qd

## 2024-02-07 NOTE — Assessment & Plan Note (Addendum)
 Etiology unclear , recent labs reviewed, pt for neurology referral as requested, also continue PT as she already has

## 2024-02-07 NOTE — Progress Notes (Signed)
 The test results show that your current treatment is OK, as the tests are stable.  Please continue the same plan.  There is no other need for change of treatment or further evaluation based on these results, at this time.  thanks

## 2024-02-07 NOTE — Assessment & Plan Note (Addendum)
 Stable, cont current med tx inhaler asd

## 2024-02-26 ENCOUNTER — Telehealth: Payer: Self-pay

## 2024-02-26 NOTE — Telephone Encounter (Signed)
 Copied from CRM #[id number]. Topic: Referral - Status >> Feb 26, 2024 11:40 AM Charolett L wrote: Reason for CRM: patient is calling for the referral status for the neuro and nephro and would like a call back so that she can schedule an appt

## 2024-02-28 ENCOUNTER — Other Ambulatory Visit: Payer: Self-pay | Admitting: Internal Medicine

## 2024-02-28 DIAGNOSIS — E782 Mixed hyperlipidemia: Secondary | ICD-10-CM

## 2024-03-04 ENCOUNTER — Inpatient Hospital Stay: Attending: Psychiatry

## 2024-03-04 DIAGNOSIS — C538 Malignant neoplasm of overlapping sites of cervix uteri: Secondary | ICD-10-CM | POA: Diagnosis present

## 2024-03-04 DIAGNOSIS — Z452 Encounter for adjustment and management of vascular access device: Secondary | ICD-10-CM | POA: Diagnosis not present

## 2024-03-07 NOTE — Telephone Encounter (Signed)
 Called and gave Pt phone numbers to the kidney and neurology office to schedule an appt.

## 2024-03-11 DIAGNOSIS — L821 Other seborrheic keratosis: Secondary | ICD-10-CM | POA: Diagnosis not present

## 2024-03-11 DIAGNOSIS — D235 Other benign neoplasm of skin of trunk: Secondary | ICD-10-CM | POA: Diagnosis not present

## 2024-03-11 DIAGNOSIS — D1801 Hemangioma of skin and subcutaneous tissue: Secondary | ICD-10-CM | POA: Diagnosis not present

## 2024-03-11 DIAGNOSIS — D492 Neoplasm of unspecified behavior of bone, soft tissue, and skin: Secondary | ICD-10-CM | POA: Diagnosis not present

## 2024-03-11 DIAGNOSIS — L814 Other melanin hyperpigmentation: Secondary | ICD-10-CM | POA: Diagnosis not present

## 2024-03-27 ENCOUNTER — Other Ambulatory Visit: Payer: Self-pay | Admitting: Hematology and Oncology

## 2024-04-01 DIAGNOSIS — N1832 Chronic kidney disease, stage 3b: Secondary | ICD-10-CM | POA: Diagnosis not present

## 2024-04-01 LAB — LAB REPORT - SCANNED: EGFR: 29

## 2024-04-17 ENCOUNTER — Encounter: Payer: Self-pay | Admitting: Hematology and Oncology

## 2024-04-17 ENCOUNTER — Encounter (HOSPITAL_COMMUNITY)
Admission: RE | Admit: 2024-04-17 | Discharge: 2024-04-17 | Disposition: A | Discharge status: Home / Self Care | Source: Ambulatory Visit | Attending: Psychiatry | Admitting: Psychiatry

## 2024-04-17 ENCOUNTER — Inpatient Hospital Stay: Attending: Psychiatry

## 2024-04-17 DIAGNOSIS — Z9221 Personal history of antineoplastic chemotherapy: Secondary | ICD-10-CM | POA: Insufficient documentation

## 2024-04-17 DIAGNOSIS — R197 Diarrhea, unspecified: Secondary | ICD-10-CM

## 2024-04-17 DIAGNOSIS — C538 Malignant neoplasm of overlapping sites of cervix uteri: Secondary | ICD-10-CM | POA: Insufficient documentation

## 2024-04-17 DIAGNOSIS — C539 Malignant neoplasm of cervix uteri, unspecified: Secondary | ICD-10-CM | POA: Insufficient documentation

## 2024-04-17 LAB — COMPREHENSIVE METABOLIC PANEL WITH GFR
ALT: 18 U/L (ref 0–44)
AST: 15 U/L (ref 15–41)
Albumin: 4 g/dL (ref 3.5–5.0)
Alkaline Phosphatase: 89 U/L (ref 38–126)
Anion gap: 7 (ref 5–15)
BUN: 24 mg/dL — ABNORMAL HIGH (ref 8–23)
CO2: 25 mmol/L (ref 22–32)
Calcium: 9.7 mg/dL (ref 8.9–10.3)
Chloride: 108 mmol/L (ref 98–111)
Creatinine, Ser: 1.52 mg/dL — ABNORMAL HIGH (ref 0.44–1.00)
GFR, Estimated: 35 mL/min — ABNORMAL LOW (ref >60)
Glucose, Bld: 95 mg/dL (ref 70–99)
Potassium: 4.4 mmol/L (ref 3.5–5.1)
Sodium: 140 mmol/L (ref 135–145)
Total Bilirubin: 0.4 mg/dL (ref 0.0–1.2)
Total Protein: 6.8 g/dL (ref 6.5–8.1)

## 2024-04-17 LAB — CBC WITH DIFFERENTIAL/PLATELET
Abs Immature Granulocytes: 0.01 K/uL (ref 0.00–0.07)
Basophils Absolute: 0 K/uL (ref 0.0–0.1)
Basophils Relative: 1 %
Eosinophils Absolute: 0.2 K/uL (ref 0.0–0.5)
Eosinophils Relative: 4 %
HCT: 36.1 % (ref 36.0–46.0)
Hemoglobin: 12.1 g/dL (ref 12.0–15.0)
Immature Granulocytes: 0 %
Lymphocytes Relative: 9 %
Lymphs Abs: 0.4 K/uL — ABNORMAL LOW (ref 0.7–4.0)
MCH: 33.1 pg (ref 26.0–34.0)
MCHC: 33.5 g/dL (ref 30.0–36.0)
MCV: 98.6 fL (ref 80.0–100.0)
Monocytes Absolute: 0.4 K/uL (ref 0.1–1.0)
Monocytes Relative: 7 %
Neutro Abs: 3.8 K/uL (ref 1.7–7.7)
Neutrophils Relative %: 79 %
Platelets: 181 K/uL (ref 150–400)
RBC: 3.66 MIL/uL — ABNORMAL LOW (ref 3.87–5.11)
RDW: 11.7 % (ref 11.5–15.5)
WBC: 4.9 K/uL (ref 4.0–10.5)
nRBC: 0 % (ref 0.0–0.2)

## 2024-04-17 LAB — GLUCOSE, CAPILLARY: Glucose-Capillary: 98 mg/dL (ref 70–99)

## 2024-04-17 MED ORDER — HEPARIN SOD (PORK) LOCK FLUSH 100 UNIT/ML IV SOLN
500.0000 [IU] | Freq: Once | INTRAVENOUS | Status: AC
  Administered 2024-04-17: 500 [IU] via INTRAVENOUS
  Filled 2024-04-17: qty 5

## 2024-04-17 MED ORDER — FLUDEOXYGLUCOSE F - 18 (FDG) INJECTION
8.3000 | Freq: Once | INTRAVENOUS | Status: AC
  Administered 2024-04-17: 8.3 via INTRAVENOUS

## 2024-04-21 ENCOUNTER — Ambulatory Visit: Payer: Self-pay | Admitting: Psychiatry

## 2024-04-28 ENCOUNTER — Inpatient Hospital Stay (HOSPITAL_BASED_OUTPATIENT_CLINIC_OR_DEPARTMENT_OTHER): Admitting: Psychiatry

## 2024-04-28 ENCOUNTER — Other Ambulatory Visit: Payer: Self-pay | Admitting: Hematology and Oncology

## 2024-04-28 ENCOUNTER — Encounter: Payer: Self-pay | Admitting: Psychiatry

## 2024-04-28 VITALS — BP 123/89 | HR 67 | Temp 98.4°F | Resp 19 | Wt 165.0 lb

## 2024-04-28 DIAGNOSIS — C538 Malignant neoplasm of overlapping sites of cervix uteri: Secondary | ICD-10-CM | POA: Diagnosis not present

## 2024-04-28 DIAGNOSIS — C539 Malignant neoplasm of cervix uteri, unspecified: Secondary | ICD-10-CM

## 2024-04-28 NOTE — Patient Instructions (Signed)
 It was a pleasure to see you in clinic today. - Normal exam - Return visit planned for 6 months (Dr. [name] in 62mo). - Pet scan ordered for 6 months  Thank you very much for allowing me to provide care for you today.  I appreciate your confidence in choosing our Gynecologic Oncology team at [facility].  If you have any questions about your visit today please call our office or send us  a MyChart message and we will get back to you as soon as possible.

## 2024-04-28 NOTE — Progress Notes (Signed)
 Gynecologic Oncology Return Clinic Visit  Date of Service: 04/28/2024 Referring Provider: [name], MD   Assessment & Plan: Paula [name] is a 78 y.o. 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  Cervical cancer: - S/p definitive cisRT and brachy - NED on exam today. - CPS <1% - PET 10/15/23 NED. Repeat on 04/17/24 NED, reviewed with patient. Repeat in 2mo (ordered).  - Signs/symptoms of recurrence reviewed. - Continue surveillance with follow-up  q9months initially, can alternate with Dr. [name]. - Has port in place.  Would like removed. - Reviewed that after 5 years NED, will be safe to return to Ob/Gyn.  RTC 33mo Dr. [name], 2mo Gyn onc  Paula Masters, MD Gynecologic Oncology   Medical Decision Making I personally spent  TOTAL 20 minutes face-to-face and non-face-to-face in the care of this patient, which includes all pre, intra, and post visit time on the date of service.   ----------------------- Reason for Visit: Surveillance  Treatment History: Oncology History Overview Note  PD-L1 is negative, CPS <1%   Malignant neoplasm of cervix (HCC)  03/08/2023 Initial Diagnosis   Presented to OB/GYN for postmenopausal bleeding. Pap with HSIL, HPV 16 positive   03/26/2023 Initial Biopsy   Cervical biopsy: A. ANTERIOR RIGHT CERVICAL MASS, BIOPSY:       Invasive squamous cell carcinoma, well-differentiated,  keratinizing.    04/05/2023 Cancer Staging   Staging form: Cervix Uteri, AJCC Version 9 - Clinical stage from 04/05/2023: FIGO Stage IIIC1r, calculated as Stage IIIC1 (cT2b, cN1, cM0) - Signed by [name] Hoy, MD on 04/16/2023 Histopathologic type: Squamous cell carcinoma, NOS Stage prefix: Initial diagnosis Stage confirmation method: Imaging Pelvic nodal status: Positive Pelvic nodal method of assessment: PET Para-aortic status:  Negative Para-aortic nodal method of assessment: PET Histologic grade (G): G1 Histologic grading system: 3 grade system   04/05/2023 Imaging   PET: IMPRESSION: 1. The cervical mass is hypermetabolic compatible with primary cervical carcinoma. 2. 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 3. No signs of solid organ metastasis. 4. Possible exophytic lesion off the upper pole of the left kidney measuring 2 cm. Difficult to confirm within the limitations of nondiagnostic CT and unenhanced technique. Consider further evaluation with renal ultrasound. 5. Coronary artery calcifications. 6.  Aortic Atherosclerosis (ICD10-I70.0).   04/30/2023 Procedure   Placement of a subcutaneous power-injectable port device. Catheter tip at the superior cavoatrial junction.   05/09/2023 - 06/14/2023 Chemotherapy   Patient is on Treatment Plan : Cervical cancer Cisplatin  (35) q7d + XRT x 6 cycles     10/15/2023 PET scan   1. No evidence of recurrent or metastatic disease. 2. Bilateral adrenal adenomas. 3. Aortic atherosclerosis (ICD10-I70.0). Coronary artery calcification.   04/17/2024 PET scan   1. No evidence of metabolically active disease. 2. Thoracic spondylosis. 3. Systemic atherosclerosis.     Interval History: Presents today with her son.  Reports that she is overall doing well. No new vaginal bleeding, abdominal/pelvic pain, unintentional weight loss, change in bowel or bladder habits, early satiety, bloating, nausea/vomiting. Using vaginal dilator, 2x/week, 5 minutes each time.     Past Medical/Surgical History: Past Medical History:  Diagnosis Date   Allergy    Arthritis    left knee   Bipolar 1 disorder [facility])    Follows w/ psychiatry, Dr. Elna [name].   Cervical cancer (HCC) 2024   Follows w/ [facility], s/p chemo  and radiation.   CKD (chronic kidney disease)    stage 3   Colon polyps    COPD (chronic obstructive  pulmonary disease) (HCC)    hx of smoking   Depression    Heart murmur    See 11/11/20 echocardiogram in Epic.   History of alcohol abuse    History of chicken pox    History of drug abuse [facility])    Patient states that she quit marijuana in October of 2024.   History of radiation therapy    Pelvis-05/10/23-07/18/23- Dr. Lynwood Nasuti   Hyperlipidemia    Hypertension    hx of, no longer on meds per pt on 06/27/23   Mitral valve regurgitation    See 11/11/20 echo in Epic.   Pneumonia    history of   Pre-diabetes    controlled with diet per patient   Tardive dyskinesia    Patient is being treated with Ingrezza.    Past Surgical History:  Procedure Laterality Date   APPENDECTOMY     BREAST BIOPSY     CHOLECYSTECTOMY     IR IMAGING GUIDED PORT INSERTION  04/30/2023   LUNG BIOPSY     OPERATIVE ULTRASOUND N/A 06/28/2023   Procedure: OPERATIVE ULTRASOUND;  Surgeon: Nasuti Lynwood, MD;  Location: [facility];  Service: Urology;  Laterality: N/A;   OPERATIVE ULTRASOUND N/A 07/04/2023   Procedure: OPERATIVE ULTRASOUND;  Surgeon: Nasuti Lynwood, MD;  Location: [facility];  Service: Urology;  Laterality: N/A;   OPERATIVE ULTRASOUND N/A 07/10/2023   Procedure: OPERATIVE ULTRASOUND;  Surgeon: Nasuti Lynwood, MD;  Location: [facility];  Service: Urology;  Laterality: N/A;   OPERATIVE ULTRASOUND N/A 07/16/2023   Procedure: OPERATIVE ULTRASOUND;  Surgeon: Nasuti Lynwood, MD;  Location: [facility];  Service: Urology;  Laterality: N/A;   OPERATIVE ULTRASOUND N/A 07/18/2023   Procedure: OPERATIVE ULTRASOUND;  Surgeon: Nasuti Lynwood, MD;  Location: [facility];  Service: Urology;  Laterality: N/A;   SUBTOTAL COLECTOMY  2005   [facility], [place]   TANDEM RING INSERTION N/A 06/28/2023   Procedure: TANDEM RING INSERTION;  Surgeon: Nasuti Lynwood, MD;  Location: [facility];  Service: Urology;  Laterality: N/A;   TANDEM RING  INSERTION N/A 07/04/2023   Procedure: TANDEM RING INSERTION;  Surgeon: Nasuti Lynwood, MD;  Location: [facility];  Service: Urology;  Laterality: N/A;   TANDEM RING INSERTION N/A 07/10/2023   Procedure: TANDEM RING INSERTION;  Surgeon: Nasuti Lynwood, MD;  Location: [facility];  Service: Urology;  Laterality: N/A;   TANDEM RING INSERTION N/A 07/16/2023   Procedure: TANDEM RING INSERTION;  Surgeon: Nasuti Lynwood, MD;  Location: [facility];  Service: Urology;  Laterality: N/A;   TANDEM RING INSERTION N/A 07/18/2023   Procedure: TANDEM RING INSERTION;  Surgeon: Nasuti Lynwood, MD;  Location: [facility];  Service: Urology;  Laterality: N/A;    Family History  Problem Relation Age of Onset   Hyperlipidemia Mother    COPD Mother        smoked   Arthritis Father    Heart disease Father    Diabetes Father    Prostate cancer Brother    Heart disease Paternal Grandmother    Heart disease Paternal Uncle    Breast cancer Neg Hx    Ovarian cancer Neg Hx    Colon cancer Neg Hx    Endometrial cancer Neg Hx     Social History  Socioeconomic History   Marital status: Single    Spouse name: Not on file   Number of children: 1   Years of education: 14   Highest education level: Not on file  Occupational History   Occupation: Retired [employer]  Tobacco Use   Smoking status: Former    Current packs/day: 0.00    Average packs/day: 1 pack/day for 55.0 years (55.0 ttl pk-yrs)    Types: Cigarettes    Start date: 08/03/1961    Quit date: 08/03/2016    Years since quitting: 7.7   Smokeless tobacco: Never  Vaping Use   Vaping status: Never Used  Substance and Sexual Activity   Alcohol use: Not Currently    Alcohol/week: 4.0 - 6.0 standard drinks of alcohol    Types: 4 - 6 Cans of beer per week    Comment: Stopped drinking alcohol in October 2024 per patient   Drug use: Not Currently    Frequency: 7.0 times per week    Types: Marijuana     Comment: Stopped in October 2024   Sexual activity: Not Currently    Birth control/protection: Post-menopausal  Other Topics Concern   Not on file  Social History Narrative   Fun/Hobby: Read   Denies abuse and feels safe at home       Lives alone/2025   Social Drivers of Health   Financial Resource Strain: Low Risk  (12/21/2023)   Overall Financial Resource Strain (CARDIA)    Difficulty of Paying Living Expenses: Not hard at all  Food Insecurity: No Food Insecurity (12/21/2023)   Hunger Vital Sign    Worried About Running Out of Food in the Last Year: Never true    Ran Out of Food in the Last Year: Never true  Transportation Needs: No Transportation Needs (12/21/2023)   PRAPARE - Administrator, Civil Service (Medical): No    Lack of Transportation (Non-Medical): No  Physical Activity: Insufficiently Active (12/21/2023)   Exercise Vital Sign    Days of Exercise per Week: 1 day    Minutes of Exercise per Session: 50 min  Stress: No Stress Concern Present (12/21/2023)   [employer] of Occupational Health - Occupational Stress Questionnaire    Feeling of Stress: Not at all  Social Connections: Socially Isolated (12/21/2023)   Social Connection and Isolation Panel    Frequency of Communication with Friends and Family: More than three times a week    Frequency of Social Gatherings with Friends and Family: Once a week    Attends Religious Services: Never    Database Administrator or Organizations: No    Attends Banker Meetings: Never    Marital Status: Divorced    Current Medications:  Current Outpatient Medications:    aspirin EC 81 MG tablet, Take 81 mg by mouth daily., Disp: , Rfl:    AUSTEDO XR 42 MG TB24, Take 1 tablet by mouth daily., Disp: , Rfl:    buPROPion (WELLBUTRIN XL) 150 MG 24 hr tablet, Take 300 mg by mouth daily., Disp: , Rfl:    Cholecalciferol (VITAMIN D3) 2000 units TABS, Take by mouth., Disp: , Rfl:    ezetimibe  (ZETIA ) 10 MG  tablet, TAKE 1 TABLET BY MOUTH EVERY DAY, Disp: 90 tablet, Rfl: 3   famotidine  (PEPCID ) 20 MG tablet, TAKE 1 TABLET BY MOUTH TWICE A DAY, Disp: 180 tablet, Rfl: 3   FLUoxetine (PROZAC) 10 MG capsule, Take 10 mg by mouth daily. Takes 2 one time daily,  Disp: , Rfl: 2   gabapentin  (NEURONTIN ) 300 MG capsule, TAKE 1 CAPSULE BY MOUTH EVERYDAY AT BEDTIME, Disp: 30 capsule, Rfl: 3   lidocaine -prilocaine  (EMLA ) cream, Apply topically as needed., Disp: 30 g, Rfl: 3   LINZESS  145 MCG CAPS capsule, TAKE 1 CAPSULE (145 MCG TOTAL) BY MOUTH DAILY AS NEEDED., Disp: 30 capsule, Rfl: 11   multivitamin-iron-minerals-folic acid  (CENTRUM) chewable tablet, Chew 1 tablet by mouth daily., Disp: , Rfl:    MYRBETRIQ  50 MG TB24 tablet, TAKE 1 TABLET BY MOUTH EVERY DAY, Disp: 90 tablet, Rfl: 3   Omega-3 Fatty Acids (FISH OIL) 1000 MG CAPS, Take 2 capsules by mouth daily., Disp: , Rfl:    ondansetron  (ZOFRAN ) 8 MG tablet, Take 1 tablet (8 mg total) by mouth every 8 (eight) hours as needed for nausea., Disp: 30 tablet, Rfl: 3   pravastatin  (PRAVACHOL ) 40 MG tablet, TAKE 1 TABLET BY MOUTH EVERY DAY, Disp: 90 tablet, Rfl: 3   triamcinolone  cream (KENALOG ) 0.1 %, APPLY TO AFFECTED AREA TWICE A DAY (Patient taking differently: APPLY TO AFFECTED AREA TWICE A DAY as needed), Disp: 30 g, Rfl: 1   trimethoprim  (TRIMPEX ) 100 MG tablet, TAKE 1 TABLET BY MOUTH EVERY DAY, Disp: 90 tablet, Rfl: 3   zolpidem  (AMBIEN ) 5 MG tablet, Take 1 tablet (5 mg total) by mouth at bedtime as needed for sleep., Disp: 30 tablet, Rfl: 5   prochlorperazine  (COMPAZINE ) 10 MG tablet, Take 1 tablet (10 mg total) by mouth every 6 (six) hours as needed for nausea or vomiting. (Patient not taking: Reported on 02/07/2024), Disp: 30 tablet, Rfl: 3  Review of Symptoms: Complete 10-system review is positive for: Constipation (chronic, unchanged)  Physical Exam: BP 123/89 (BP Location: Left Arm, Patient Position: Sitting)   Pulse 67   Temp 98.4 F (36.9 C) (Oral)    Resp 19   Wt 165 lb (74.8 kg)   SpO2 96%   BMI 29.23 kg/m  General: Alert, oriented, no acute distress. HEENT: Normocephalic, atraumatic. Neck symmetric without masses. Sclera anicteric. Chest: Normal work of breathing. Clear to auscultation bilaterally.  Port site clean. Cardiovascular: Regular rate and rhythm, no murmurs. Abdomen: Soft, nontender.  Normoactive bowel sounds.  No masses appreciated. Extremities: Grossly normal range of motion.  Warm, well perfused.  No edema bilaterally. Skin: No rashes or lesions noted. Lymphatics: No cervical, supraclavicular, or inguinal adenopathy. GU: Normal appearing external genitalia without erythema, excoriation, or lesions.  Speculum exam reveals normal vaginal mucosa, some coaptation of the vaginal apex but portion of cervix visualized without lesion.  Bimanual exam reveals narrowing at vaginal apex but smooth cervix, excellent response to therapy.  Exam chaperoned by Kimberly Jordan, CMA     Laboratory & Radiologic Studies: NM PET Image Restage (PS) Skull Base to Thigh (F-18 FDG) 04/17/2024  Narrative EXAM: PET AND CT SKULL BASE TO MID THIGH 04/17/2024 12:15:40 PM  TECHNIQUE:  RADIOPHARMACEUTICAL: 8.3 mCi F-18 FDG Uptake time 60 minutes. Glucose level 98 mg/dl. Blood pool SUV 2.6.  PET imaging was acquired from the base of the skull to the mid thighs. Non-contrast enhanced computed tomography was obtained for attenuation correction and anatomic localization.  COMPARISON: 10/15/2023  CLINICAL HISTORY: Cervical cancer, monitor.  FINDINGS:  HEAD AND NECK: No metabolically active cervical lymphadenopathy.  CHEST: Right port-a-cath tip in the right atrium. Thoracic aortic, coronary artery, and branch vessel atheromatous vascular calcifications. Mild cardiomegaly. Mitral valve calcification. Postoperative findings in the right lung with some hazy ground glass opacities in both lungs  unchanged from the prior exam.  No metabolically active pulmonary nodules. No metabolically active lymphadenopathy.  ABDOMEN AND PELVIS: Cholecystectomy. Small bilateral adrenal adenomas are benign and warrant no further imaging workup. Photopenic bilateral renal cysts are benign and warrant no further imaging workup. Systemic atherosclerosis is present, including the aorta and iliac arteries. No metabolically active intraperitoneal mass. No metabolically active lymphadenopathy. Physiologic activity within the gastrointestinal and genitourinary systems.  BONES AND SOFT TISSUE: Old bilateral rib deformities from healed fractures. Thoracic spondylosis. Old healed fractures of the left L2 and L3 transverse processes. No abnormal FDG activity localizes to the bones. No metabolically active aggressive osseous lesion.  IMPRESSION: 1. No evidence of metabolically active disease. 2. Thoracic spondylosis. 3. Systemic atherosclerosis.  Electronically signed by: Ryan Salvage MD 04/18/2024 04:52 PM EST RP Workstation: [id number]

## 2024-05-19 ENCOUNTER — Inpatient Hospital Stay (HOSPITAL_COMMUNITY): Admission: RE | Admit: 2024-05-19 | Discharge: 2024-05-19 | Attending: Hematology and Oncology

## 2024-05-19 DIAGNOSIS — C539 Malignant neoplasm of cervix uteri, unspecified: Secondary | ICD-10-CM | POA: Diagnosis not present

## 2024-05-19 DIAGNOSIS — Z452 Encounter for adjustment and management of vascular access device: Secondary | ICD-10-CM | POA: Insufficient documentation

## 2024-05-19 HISTORY — PX: IR REMOVAL TUN ACCESS W/ PORT W/O FL MOD SED: IMG2290

## 2024-05-19 MED ORDER — LIDOCAINE-EPINEPHRINE 1 %-1:100000 IJ SOLN
INTRAMUSCULAR | Status: AC
  Filled 2024-05-19: qty 1

## 2024-05-19 MED ORDER — LIDOCAINE-EPINEPHRINE 1 %-1:100000 IJ SOLN
10.0000 mL | Freq: Once | INTRAMUSCULAR | Status: AC
  Administered 2024-05-19: 09:00:00 10 mL via INTRADERMAL

## 2024-05-19 NOTE — Procedures (Signed)
 Interventional Radiology Procedure:   Indications: Cervical cancer and port is no longer needed  Procedure: Port explant  Findings: Removal of right chest port.    Complications: None     EBL: Minimal  Plan:  Keep incision covered and dry for 24 hours   Eleisha Branscomb R. Philip, MD  Pager: [phone]

## 2024-05-27 ENCOUNTER — Ambulatory Visit: Admitting: Internal Medicine

## 2024-05-27 ENCOUNTER — Encounter: Payer: Self-pay | Admitting: Internal Medicine

## 2024-05-27 VITALS — BP 120/72 | HR 67 | Temp 98.4°F | Ht 63.0 in | Wt 164.0 lb

## 2024-05-27 DIAGNOSIS — I10 Essential (primary) hypertension: Secondary | ICD-10-CM | POA: Diagnosis not present

## 2024-05-27 DIAGNOSIS — R7303 Prediabetes: Secondary | ICD-10-CM

## 2024-05-27 DIAGNOSIS — E782 Mixed hyperlipidemia: Secondary | ICD-10-CM | POA: Diagnosis not present

## 2024-05-27 DIAGNOSIS — N1832 Chronic kidney disease, stage 3b: Secondary | ICD-10-CM

## 2024-05-27 DIAGNOSIS — H1010 Acute atopic conjunctivitis, unspecified eye: Secondary | ICD-10-CM | POA: Insufficient documentation

## 2024-05-27 DIAGNOSIS — M1712 Unilateral primary osteoarthritis, left knee: Secondary | ICD-10-CM

## 2024-05-27 DIAGNOSIS — H1013 Acute atopic conjunctivitis, bilateral: Secondary | ICD-10-CM

## 2024-05-27 MED ORDER — AZELASTINE HCL 0.05 % OP SOLN: 1.0000 [drp] | Freq: Two times a day (BID) | OPHTHALMIC | 12 refills | Status: AC

## 2024-05-27 NOTE — Assessment & Plan Note (Signed)
 With mild intermittent pain only, for tylenol , but should consider ortho for any further falls, declines this today

## 2024-05-27 NOTE — Progress Notes (Signed)
 Patient ID: SCHERYL SANBORN, female   DOB: 1946/05/30, 78 y.o.   MRN: [id number]        Chief Complaint: follow up eye allergies, left knee arthritis, ckd3a, preDM, hld, htn       HPI:  SHARINE CADLE is a 78 y.o. female here overall doing ok;  Pt denies chest pain, increased sob or doe, wheezing, orthopnea, PND, increased LE swelling, palpitations, dizziness or syncope.   Pt denies polydipsia, polyuria, or new focal neuro s/s.    Pt denies fever, wt loss, night sweats, loss of appetite, or other constitutional symptoms    Does have several wks ongoing eye allergy symptoms with clearish congestion, itch and mild redness swelling of eyelids, but without fever, pain, ST, cough, or wheezing.  Also has 1 wk onset left knee pain swelling with a tendency to giveaway, and did fall x 1 without injury.    Wt Readings from Last 3 Encounters:  05/27/24 164 lb (74.4 kg)  04/28/24 165 lb (74.8 kg)  02/07/24 166 lb 3.2 oz (75.4 kg)   BP Readings from Last 3 Encounters:  05/27/24 120/72  04/28/24 123/89  02/07/24 (!) 108/58         Past Medical History:  Diagnosis Date   Allergy    Arthritis    left knee   Bipolar 1 disorder (HCC)    Follows w/ psychiatry, Dr. Elna Lo.   Cervical cancer (HCC) 2024   Follows w/ [facility], s/p chemo and radiation.   CKD (chronic kidney disease)    stage 3   Colon polyps    COPD (chronic obstructive pulmonary disease) (HCC)    hx of smoking   Depression    Heart murmur    See 11/11/20 echocardiogram in Epic.   History of alcohol abuse    History of chicken pox    History of drug abuse [facility])    Patient states that she quit marijuana in October of 2024.   History of radiation therapy    Pelvis-05/10/23-07/18/23- Dr. Lynwood Nasuti   Hyperlipidemia    Hypertension    hx of, no longer on meds per pt on 06/27/23   Mitral valve regurgitation    See 11/11/20 echo in Epic.   Pneumonia    history of   Pre-diabetes    controlled with diet per  patient   Tardive dyskinesia    Patient is being treated with Ingrezza.   Past Surgical History:  Procedure Laterality Date   APPENDECTOMY     BREAST BIOPSY     CHOLECYSTECTOMY     IR IMAGING GUIDED PORT INSERTION  04/30/2023   IR REMOVAL TUN ACCESS W/ PORT W/O FL MOD SED  05/19/2024   LUNG BIOPSY     OPERATIVE ULTRASOUND N/A 06/28/2023   Procedure: OPERATIVE ULTRASOUND;  Surgeon: Nasuti Lynwood, MD;  Location: [facility];  Service: Urology;  Laterality: N/A;   OPERATIVE ULTRASOUND N/A 07/04/2023   Procedure: OPERATIVE ULTRASOUND;  Surgeon: Nasuti Lynwood, MD;  Location: [facility];  Service: Urology;  Laterality: N/A;   OPERATIVE ULTRASOUND N/A 07/10/2023   Procedure: OPERATIVE ULTRASOUND;  Surgeon: Nasuti Lynwood, MD;  Location: [facility];  Service: Urology;  Laterality: N/A;   OPERATIVE ULTRASOUND N/A 07/16/2023   Procedure: OPERATIVE ULTRASOUND;  Surgeon: Nasuti Lynwood, MD;  Location: [facility];  Service: Urology;  Laterality: N/A;   OPERATIVE ULTRASOUND N/A 07/18/2023   Procedure: OPERATIVE ULTRASOUND;  Surgeon: Nasuti Lynwood,  MD;  [facility];  Service: Urology;  Laterality: N/A;   SUBTOTAL COLECTOMY  2005   [facility] [place]   TANDEM RING INSERTION N/A 06/28/2023   Procedure: TANDEM RING INSERTION;  Surgeon: [name] Agent, MD;  Location: [facility];  Service: Urology;  Laterality: N/A;   TANDEM RING INSERTION N/A 07/04/2023   Procedure: TANDEM RING INSERTION;  Surgeon: [name] Agent, MD;  Location: [facility];  Service: Urology;  Laterality: N/A;   TANDEM RING INSERTION N/A 07/10/2023   Procedure: TANDEM RING INSERTION;  Surgeon: [name] Agent, MD;  Location: [facility];  Service: Urology;  Laterality: N/A;   TANDEM RING INSERTION N/A 07/16/2023   Procedure: TANDEM RING INSERTION;  Surgeon: [name] Agent, MD;  Location: [facility];  Service:  Urology;  Laterality: N/A;   TANDEM RING INSERTION N/A 07/18/2023   Procedure: TANDEM RING INSERTION;  Surgeon: [name] Agent, MD;  Location: [facility];  Service: Urology;  Laterality: N/A;    reports that she quit smoking about 7 years ago. Her smoking use included cigarettes. She started smoking about 62 years ago. She has a 55 pack-year smoking history. She has never used smokeless tobacco. She reports that she does not currently use alcohol after a past usage of about 4.0 - 6.0 standard drinks of alcohol per week. She reports that she does not currently use drugs after having used the following drugs: Marijuana. Frequency: 7.00 times per week. family history includes Arthritis in her father; COPD in her mother; Diabetes in her father; Heart disease in her father, paternal grandmother, and paternal uncle; Hyperlipidemia in her mother; Prostate cancer in her brother. Allergies[1] Medications Ordered Prior to Encounter[2]      ROS:  All others reviewed and negative.  Objective        PE:  BP 120/72 (BP Location: Right Arm, Patient Position: Sitting, Cuff Size: Normal)   Pulse 67   Temp 98.4 F (36.9 C) (Oral)   Ht 5' 3 (1.6 m)   Wt 164 lb (74.4 kg)   SpO2 97%   BMI 29.05 kg/m                 Constitutional: Pt appears in NAD               HENT: Head: NCAT.                Right Ear: External ear normal.                 Left Ear: External ear normal.                Eyes: . Pupils are equal, round, and reactive to light. Conjunctivae with erythema right > left and puffy eyelids upper and lower bilateral, and EOM are normal               Nose: without d/c or deformity               Neck: Neck supple. Gross normal ROM               Cardiovascular: Normal rate and regular rhythm.                 Pulmonary/Chest: Effort normal and breath sounds without rales or wheezing.                Abd:  Soft, NT, ND, + BS, no organomegaly  Neurological: Pt is alert. At  baseline orientation, motor grossly intact; left knee with 1+ effusion and crepitus               Skin: Skin is warm. No rashes, no other new lesions, LE edema - none               Psychiatric: Pt behavior is normal without agitation   Micro: none  Cardiac tracings I have personally interpreted today:  none  Pertinent Radiological findings (summarize): none   Lab Results  Component Value Date   WBC 4.9 04/17/2024   HGB 12.1 04/17/2024   HCT 36.1 04/17/2024   PLT 181 04/17/2024   GLUCOSE 95 04/17/2024   CHOL 147 02/07/2024   TRIG 241.0 (H) 02/07/2024   HDL 69.20 02/07/2024   LDLDIRECT 73.0 06/16/2021   LDLCALC 30 02/07/2024   ALT 18 04/17/2024   AST 15 04/17/2024   NA 140 04/17/2024   K 4.4 04/17/2024   CL 108 04/17/2024   CREATININE 1.52 (H) 04/17/2024   BUN 24 (H) 04/17/2024   CO2 25 04/17/2024   TSH 0.63 01/15/2023   HGBA1C 5.4 02/07/2024   Assessment/Plan:  Paula [name] is a 78 y.o. White or Caucasian [1] female with  has a past medical history of Allergy, Arthritis, Bipolar 1 disorder (HCC), Cervical cancer (HCC) (2024), CKD (chronic kidney disease), Colon polyps, COPD (chronic obstructive pulmonary disease) (HCC), Depression, Heart murmur, History of alcohol abuse, History of chicken pox, History of drug abuse (HCC), History of radiation therapy, Hyperlipidemia, Hypertension, Mitral valve regurgitation, Pneumonia, Pre-diabetes, and Tardive dyskinesia.  Pre-diabetes Lab Results  Component Value Date   HGBA1C 5.4 02/07/2024   Stable, pt to continue current medical treatment - diet, wt control   Mixed hyperlipidemia Lab Results  Component Value Date   LDLCALC 30 02/07/2024   Stable, pt to continue current statin zetia  10 mg mg, pravachol  40 mg qd   Essential hypertension BP Readings from Last 3 Encounters:  05/27/24 120/72  04/28/24 123/89  02/07/24 (!) 108/58   Stable, pt to continue medical treatment  - diet, wt control   CKD (chronic kidney  disease) stage 3, GFR 30-59 ml/min (HCC) Lab Results  Component Value Date   CREATININE 1.52 (H) 04/17/2024   Stable overall, cont to avoid nephrotoxins and nsaids, f/u renal as planned, pt declines lab today, ckd3a  Allergic conjunctivitis Mild to mod, for optivar  op asd prn,  to f/u any worsening symptoms or concerns  Primary osteoarthritis of left knee With mild intermittent pain only, for tylenol , but should consider ortho for any further falls, declines this today  Followup: Return in about 6 months (around 11/25/2024).  Lynwood Rush, MD 05/27/2024 10:45 AM [facility]     [1]  Allergies Allergen Reactions   Pecan Nut (Diagnostic) Anaphylaxis   Penicillins Other (See Comments)    Has taken and not had reaction, allergist did several tests and told her not to take  [2]  Current Outpatient Medications on File Prior to Visit  Medication Sig Dispense Refill   aspirin EC 81 MG tablet Take 81 mg by mouth daily.     AUSTEDO XR 30 MG TB24 Take 1 tablet by mouth daily.     AUSTEDO XR 42 MG TB24 Take 1 tablet by mouth daily.     buPROPion (WELLBUTRIN XL) 150 MG 24 hr tablet Take 300 mg by mouth daily.  Cholecalciferol (VITAMIN D3) 2000 units TABS Take by mouth.     ezetimibe  (ZETIA ) 10 MG tablet TAKE 1 TABLET BY MOUTH EVERY DAY 90 tablet 3   famotidine  (PEPCID ) 20 MG tablet TAKE 1 TABLET BY MOUTH TWICE A DAY 180 tablet 3   FLUoxetine (PROZAC) 10 MG capsule Take 10 mg by mouth daily. Takes 2 one time daily  2   gabapentin  (NEURONTIN ) 300 MG capsule TAKE 1 CAPSULE BY MOUTH EVERYDAY AT BEDTIME 30 capsule 3   LINZESS  145 MCG CAPS capsule TAKE 1 CAPSULE (145 MCG TOTAL) BY MOUTH DAILY AS NEEDED. 30 capsule 11   multivitamin-iron-minerals-folic acid  (CENTRUM) chewable tablet Chew 1 tablet by mouth daily.     MYRBETRIQ  50 MG TB24 tablet TAKE 1 TABLET BY MOUTH EVERY DAY 90 tablet 3   Omega-3 Fatty Acids (FISH OIL) 1000  MG CAPS Take 2 capsules by mouth daily.     pravastatin  (PRAVACHOL ) 40 MG tablet TAKE 1 TABLET BY MOUTH EVERY DAY 90 tablet 3   trimethoprim  (TRIMPEX ) 100 MG tablet TAKE 1 TABLET BY MOUTH EVERY DAY 90 tablet 3   zolpidem  (AMBIEN ) 5 MG tablet Take 1 tablet (5 mg total) by mouth at bedtime as needed for sleep. 30 tablet 5   No current facility-administered medications on file prior to visit.

## 2024-05-27 NOTE — Assessment & Plan Note (Addendum)
 Lab Results  Component Value Date   CREATININE 1.52 (H) 04/17/2024   Stable overall, cont to avoid nephrotoxins and nsaids, f/u renal as planned, pt declines lab today, ckd3a

## 2024-05-27 NOTE — Assessment & Plan Note (Signed)
 Lab Results  Component Value Date   HGBA1C 5.4 02/07/2024   Stable, pt to continue current medical treatment  - diet, wt control

## 2024-05-27 NOTE — Assessment & Plan Note (Signed)
 BP Readings from Last 3 Encounters:  05/27/24 120/72  04/28/24 123/89  02/07/24 (!) 108/58   Stable, pt to continue medical treatment  - diet, wt control

## 2024-05-27 NOTE — Assessment & Plan Note (Signed)
 Lab Results  Component Value Date   LDLCALC 30 02/07/2024   Stable, pt to continue current statin zetia  10 mg mg, pravachol  40 mg qd

## 2024-05-27 NOTE — Assessment & Plan Note (Signed)
 Mild to mod, for optivar  op asd prn,  to f/u any worsening symptoms or concerns

## 2024-05-27 NOTE — Patient Instructions (Signed)
 Please take all new medication as prescribed - the eye allergy drops  Please continue all other medications as before, and refills have been done if requested.  Please have the pharmacy call with any other refills you may need.  Please continue your efforts at being more active, low cholesterol diet, and weight control.  Please keep your appointments with your specialists as you may have planned  We can hold on lab testing today  Please make an Appointment to return in 6 months, or sooner if needed

## 2024-06-09 ENCOUNTER — Telehealth: Payer: Self-pay | Admitting: Neurology

## 2024-06-09 NOTE — Telephone Encounter (Signed)
Pt no longer needs appointment

## 2024-06-26 ENCOUNTER — Encounter: Payer: Self-pay | Admitting: Hematology and Oncology

## 2024-07-07 ENCOUNTER — Institutional Professional Consult (permissible substitution): Admitting: Neurology

## 2024-07-21 ENCOUNTER — Ambulatory Visit: Admitting: Radiation Oncology

## 2024-10-13 ENCOUNTER — Ambulatory Visit (HOSPITAL_COMMUNITY)

## 2024-10-20 ENCOUNTER — Inpatient Hospital Stay: Admitting: Psychiatry

## 2024-12-23 ENCOUNTER — Ambulatory Visit
